# Patient Record
Sex: Male | Born: 1970 | Race: White | Hispanic: No | Smoking: Former smoker
Health system: Southern US, Community
[De-identification: ages and names within clinical notes are randomized; demographics above are authoritative.]

## PROBLEM LIST (undated history)

## (undated) DIAGNOSIS — I2510 Atherosclerotic heart disease of native coronary artery without angina pectoris: Secondary | ICD-10-CM

## (undated) DIAGNOSIS — I429 Cardiomyopathy, unspecified: Secondary | ICD-10-CM

## (undated) DIAGNOSIS — R079 Chest pain, unspecified: Secondary | ICD-10-CM

## (undated) DIAGNOSIS — I480 Paroxysmal atrial fibrillation: Secondary | ICD-10-CM

## (undated) DIAGNOSIS — T4145XA Adverse effect of unspecified anesthetic, initial encounter: Secondary | ICD-10-CM

## (undated) DIAGNOSIS — I10 Essential (primary) hypertension: Secondary | ICD-10-CM

## (undated) DIAGNOSIS — I214 Non-ST elevation (NSTEMI) myocardial infarction: Secondary | ICD-10-CM

## (undated) DIAGNOSIS — E785 Hyperlipidemia, unspecified: Secondary | ICD-10-CM

## (undated) DIAGNOSIS — M1990 Unspecified osteoarthritis, unspecified site: Secondary | ICD-10-CM

## (undated) DIAGNOSIS — I739 Peripheral vascular disease, unspecified: Secondary | ICD-10-CM

## (undated) DIAGNOSIS — E119 Type 2 diabetes mellitus without complications: Secondary | ICD-10-CM

## (undated) DIAGNOSIS — T8859XA Other complications of anesthesia, initial encounter: Secondary | ICD-10-CM

## (undated) DIAGNOSIS — Z720 Tobacco use: Secondary | ICD-10-CM

## (undated) HISTORY — PX: CARDIAC CATHETERIZATION: SHX172

## (undated) HISTORY — PX: CORONARY ANGIOPLASTY: SHX604

## (undated) HISTORY — DX: Cardiomyopathy, unspecified: I42.9

## (undated) HISTORY — DX: Chest pain, unspecified: R07.9

## (undated) HISTORY — DX: Hyperlipidemia, unspecified: E78.5

## (undated) HISTORY — DX: Paroxysmal atrial fibrillation: I48.0

## (undated) HISTORY — DX: Tobacco use: Z72.0

## (undated) HISTORY — DX: Essential (primary) hypertension: I10

## (undated) HISTORY — PX: TYMPANOSTOMY TUBE PLACEMENT: SHX32

---

## 2011-08-13 HISTORY — PX: MULTIPLE TOOTH EXTRACTIONS: SHX2053

## 2015-05-13 DIAGNOSIS — I214 Non-ST elevation (NSTEMI) myocardial infarction: Secondary | ICD-10-CM

## 2015-05-13 HISTORY — DX: Non-ST elevation (NSTEMI) myocardial infarction: I21.4

## 2015-05-21 ENCOUNTER — Encounter (HOSPITAL_COMMUNITY): Payer: Self-pay | Admitting: Emergency Medicine

## 2015-05-21 ENCOUNTER — Inpatient Hospital Stay (HOSPITAL_COMMUNITY)
Admission: EM | Admit: 2015-05-21 | Discharge: 2015-05-23 | DRG: 247 | Disposition: A | Discharge status: Home / Self Care | Payer: No Typology Code available for payment source | Attending: Cardiology | Admitting: Cardiology

## 2015-05-21 ENCOUNTER — Emergency Department (HOSPITAL_COMMUNITY): Payer: No Typology Code available for payment source

## 2015-05-21 DIAGNOSIS — E785 Hyperlipidemia, unspecified: Secondary | ICD-10-CM | POA: Diagnosis not present

## 2015-05-21 DIAGNOSIS — I209 Angina pectoris, unspecified: Secondary | ICD-10-CM

## 2015-05-21 DIAGNOSIS — I2510 Atherosclerotic heart disease of native coronary artery without angina pectoris: Secondary | ICD-10-CM | POA: Diagnosis not present

## 2015-05-21 DIAGNOSIS — Z7984 Long term (current) use of oral hypoglycemic drugs: Secondary | ICD-10-CM | POA: Diagnosis not present

## 2015-05-21 DIAGNOSIS — I214 Non-ST elevation (NSTEMI) myocardial infarction: Secondary | ICD-10-CM | POA: Diagnosis present

## 2015-05-21 DIAGNOSIS — F17210 Nicotine dependence, cigarettes, uncomplicated: Secondary | ICD-10-CM | POA: Diagnosis present

## 2015-05-21 DIAGNOSIS — Z833 Family history of diabetes mellitus: Secondary | ICD-10-CM | POA: Diagnosis not present

## 2015-05-21 DIAGNOSIS — Z8249 Family history of ischemic heart disease and other diseases of the circulatory system: Secondary | ICD-10-CM | POA: Diagnosis not present

## 2015-05-21 DIAGNOSIS — E1165 Type 2 diabetes mellitus with hyperglycemia: Secondary | ICD-10-CM | POA: Diagnosis present

## 2015-05-21 DIAGNOSIS — Z79899 Other long term (current) drug therapy: Secondary | ICD-10-CM

## 2015-05-21 DIAGNOSIS — I255 Ischemic cardiomyopathy: Secondary | ICD-10-CM | POA: Diagnosis present

## 2015-05-21 DIAGNOSIS — R079 Chest pain, unspecified: Secondary | ICD-10-CM

## 2015-05-21 DIAGNOSIS — I4891 Unspecified atrial fibrillation: Secondary | ICD-10-CM | POA: Diagnosis present

## 2015-05-21 DIAGNOSIS — I200 Unstable angina: Secondary | ICD-10-CM | POA: Diagnosis present

## 2015-05-21 DIAGNOSIS — R05 Cough: Secondary | ICD-10-CM | POA: Diagnosis present

## 2015-05-21 DIAGNOSIS — Z720 Tobacco use: Secondary | ICD-10-CM

## 2015-05-21 DIAGNOSIS — E118 Type 2 diabetes mellitus with unspecified complications: Secondary | ICD-10-CM

## 2015-05-21 HISTORY — DX: Adverse effect of unspecified anesthetic, initial encounter: T41.45XA

## 2015-05-21 HISTORY — DX: Hyperlipidemia, unspecified: E78.5

## 2015-05-21 HISTORY — DX: Essential (primary) hypertension: I10

## 2015-05-21 HISTORY — DX: Atherosclerotic heart disease of native coronary artery without angina pectoris: I25.10

## 2015-05-21 HISTORY — DX: Type 2 diabetes mellitus without complications: E11.9

## 2015-05-21 HISTORY — DX: Unspecified osteoarthritis, unspecified site: M19.90

## 2015-05-21 HISTORY — DX: Tobacco use: Z72.0

## 2015-05-21 HISTORY — DX: Other complications of anesthesia, initial encounter: T88.59XA

## 2015-05-21 LAB — TROPONIN I
TROPONIN I: 0.45 ng/mL — AB (ref <0.031)
TROPONIN I: 0.66 ng/mL — AB (ref <0.031)

## 2015-05-21 LAB — CBC
HCT: 51 % (ref 39.0–52.0)
HEMOGLOBIN: 18 g/dL — AB (ref 13.0–17.0)
MCH: 33.6 pg (ref 26.0–34.0)
MCHC: 35.3 g/dL (ref 30.0–36.0)
MCV: 95.1 fL (ref 78.0–100.0)
Platelets: 121 10*3/uL — ABNORMAL LOW (ref 150–400)
RBC: 5.36 MIL/uL (ref 4.22–5.81)
RDW: 12.2 % (ref 11.5–15.5)
WBC: 8.3 10*3/uL (ref 4.0–10.5)

## 2015-05-21 LAB — GLUCOSE, CAPILLARY
Glucose-Capillary: 373 mg/dL — ABNORMAL HIGH (ref 65–99)
Glucose-Capillary: 191 mg/dL — ABNORMAL HIGH (ref 65–99)

## 2015-05-21 LAB — BASIC METABOLIC PANEL
ANION GAP: 10 (ref 5–15)
BUN: 21 mg/dL — ABNORMAL HIGH (ref 6–20)
CHLORIDE: 97 mmol/L — AB (ref 101–111)
CO2: 25 mmol/L (ref 22–32)
Calcium: 9.5 mg/dL (ref 8.9–10.3)
Creatinine, Ser: 1.01 mg/dL (ref 0.61–1.24)
GFR calc non Af Amer: >60 mL/min (ref >60)
Glucose, Bld: 340 mg/dL — ABNORMAL HIGH (ref 65–99)
POTASSIUM: 5.1 mmol/L (ref 3.5–5.1)
Sodium: 132 mmol/L — ABNORMAL LOW (ref 135–145)

## 2015-05-21 LAB — MRSA PCR SCREENING: MRSA by PCR: NEGATIVE

## 2015-05-21 LAB — HEPARIN LEVEL (UNFRACTIONATED)

## 2015-05-21 LAB — I-STAT TROPONIN, ED: Troponin i, poc: 0.04 ng/mL (ref 0.00–0.08)

## 2015-05-21 MED ORDER — SODIUM CHLORIDE 0.9 % IV SOLN
INTRAVENOUS | Status: DC
  Administered 2015-05-22: 1000 mL via INTRAVENOUS

## 2015-05-21 MED ORDER — SODIUM CHLORIDE 0.9 % IJ SOLN: 3.0000 mL | INTRAMUSCULAR | Status: DC | PRN

## 2015-05-21 MED ORDER — ACETAMINOPHEN 325 MG PO TABS
650.0000 mg | ORAL_TABLET | ORAL | Status: DC | PRN
Start: 2015-05-21 — End: 2015-05-23

## 2015-05-21 MED ORDER — METOPROLOL TARTRATE 12.5 MG HALF TABLET
12.5000 mg | ORAL_TABLET | Freq: Two times a day (BID) | ORAL | Status: DC
  Administered 2015-05-21 – 2015-05-23 (×5): 12.5 mg via ORAL
  Filled 2015-05-21 (×5): qty 1

## 2015-05-21 MED ORDER — ONDANSETRON HCL 4 MG/2ML IJ SOLN: 4.0000 mg | Freq: Four times a day (QID) | INTRAMUSCULAR | Status: DC | PRN

## 2015-05-21 MED ORDER — SODIUM CHLORIDE 0.9 % IJ SOLN
3.0000 mL | Freq: Two times a day (BID) | INTRAMUSCULAR | Status: DC
  Administered 2015-05-21: 3 mL via INTRAVENOUS

## 2015-05-21 MED ORDER — HEPARIN BOLUS VIA INFUSION
4000.0000 [IU] | Freq: Once | INTRAVENOUS | Status: AC
  Administered 2015-05-21: 4000 [IU] via INTRAVENOUS
  Filled 2015-05-21: qty 4000

## 2015-05-21 MED ORDER — DILTIAZEM HCL 25 MG/5ML IV SOLN
20.0000 mg | Freq: Once | INTRAVENOUS | Status: AC
  Administered 2015-05-21: 20 mg via INTRAVENOUS
  Filled 2015-05-21: qty 5

## 2015-05-21 MED ORDER — ASPIRIN EC 81 MG PO TBEC
81.0000 mg | DELAYED_RELEASE_TABLET | Freq: Every day | ORAL | Status: DC
  Administered 2015-05-23: 81 mg via ORAL
  Filled 2015-05-21: qty 1

## 2015-05-21 MED ORDER — SODIUM CHLORIDE 0.9 % IV SOLN: 250.0000 mL | INTRAVENOUS | Status: DC | PRN

## 2015-05-21 MED ORDER — DILTIAZEM HCL 100 MG IV SOLR
5.0000 mg/h | INTRAVENOUS | Status: DC
  Administered 2015-05-21 – 2015-05-22 (×2): 5 mg/h via INTRAVENOUS
  Filled 2015-05-21 (×2): qty 100

## 2015-05-21 MED ORDER — ATORVASTATIN CALCIUM 80 MG PO TABS
80.0000 mg | ORAL_TABLET | Freq: Every day | ORAL | Status: DC
  Administered 2015-05-21 – 2015-05-22 (×2): 80 mg via ORAL
  Filled 2015-05-21 (×2): qty 1

## 2015-05-21 MED ORDER — INSULIN ASPART 100 UNIT/ML ~~LOC~~ SOLN
0.0000 [IU] | Freq: Three times a day (TID) | SUBCUTANEOUS | Status: DC
  Administered 2015-05-21: 15 [IU] via SUBCUTANEOUS
  Administered 2015-05-22: 8 [IU] via SUBCUTANEOUS
  Administered 2015-05-22 (×2): 5 [IU] via SUBCUTANEOUS
  Administered 2015-05-23: 8 [IU] via SUBCUTANEOUS

## 2015-05-21 MED ORDER — NITROGLYCERIN 0.4 MG SL SUBL: 0.4000 mg | SUBLINGUAL_TABLET | SUBLINGUAL | Status: DC | PRN

## 2015-05-21 MED ORDER — NITROGLYCERIN IN D5W 200-5 MCG/ML-% IV SOLN
0.0000 ug/min | Freq: Once | INTRAVENOUS | Status: AC
  Administered 2015-05-21: 10 ug/min via INTRAVENOUS
  Filled 2015-05-21: qty 250

## 2015-05-21 MED ORDER — HEPARIN (PORCINE) IN NACL 100-0.45 UNIT/ML-% IJ SOLN
2000.0000 [IU]/h | INTRAMUSCULAR | Status: DC
  Administered 2015-05-21: 1300 [IU]/h via INTRAVENOUS
  Administered 2015-05-21: 1700 [IU]/h via INTRAVENOUS
  Filled 2015-05-21 (×3): qty 250

## 2015-05-21 MED ORDER — HEPARIN BOLUS VIA INFUSION
3000.0000 [IU] | Freq: Once | INTRAVENOUS | Status: AC
  Administered 2015-05-21: 3000 [IU] via INTRAVENOUS
  Filled 2015-05-21: qty 3000

## 2015-05-21 MED ORDER — ASPIRIN 81 MG PO CHEW
324.0000 mg | CHEWABLE_TABLET | Freq: Once | ORAL | Status: AC
  Administered 2015-05-21: 324 mg via ORAL
  Filled 2015-05-21: qty 4

## 2015-05-21 NOTE — Progress Notes (Signed)
ANTICOAGULATION CONSULT NOTE - Initial Consult  Pharmacy Consult for Heparin Indication: ACS / STEMI  No Known Allergies  Patient Measurements: Height:  (180.3 cm) Weight: 260 lb (117.935 kg) IBW/kg (Calculated) : 75.3 Heparin Dosing Weight: 101 kg  Vital Signs: Temp: 97.5 F (36.4 C) (10/09 0901) Temp Source: Oral (10/09 0901) BP: 102/70 mmHg (10/09 1046) Pulse Rate: 88 (10/09 1046)  Labs:  Recent Labs  05/21/15 0920  HGB 18.0*  HCT 51.0  PLT 121*  CREATININE 1.01    Estimated Creatinine Clearance: 119.3 mL/min (by C-G formula based on Cr of 1.01).   Medical History: Past Medical History  Diagnosis Date  . Diabetes mellitus without complication [facility])     Assessment: 44 yo M presents to the ED on 10/9 with chest pain and SOB. Pharmacy consulted to start heparin for ACS. Not on any anticoag PTA. Hgb ok and plts low at 121. SCr 1.01, CrCl > 118ml/min.  Goal of Therapy:  Heparin level 0.3-0.7 units/ml Monitor platelets by anticoagulation protocol: Yes   Plan:  Give 4,000 units heparin BOLUS Start heparin gtt at 1,300 units/hr Check 6 hr HL Monitor daily HL, CBC, s/s of bleed  Kambria Grima J 05/21/2015,11:00 AM

## 2015-05-21 NOTE — Progress Notes (Signed)
ANTICOAGULATION CONSULT NOTE - Follow Up Consult  Pharmacy Consult for heparin Indication: chest pain/ACS  No Known Allergies  Patient Measurements: Height:  (180.3 cm) Weight: 257 lb (116.574 kg) IBW/kg (Calculated) : 75.3 Heparin Dosing Weight: 101 kg  Vital Signs: Temp: 98.1 F (36.7 C) (10/09 1927) Temp Source: Oral (10/09 1927) BP: 117/78 mmHg (10/09 1927) Pulse Rate: 71 (10/09 1927)  Labs:  Recent Labs  05/21/15 0920 05/21/15 1620 05/21/15 1853  HGB 18.0*  --   --   HCT 51.0  --   --   PLT 121*  --   --   HEPARINUNFRC  --   --  <0.10*  CREATININE 1.01  --   --   TROPONINI  --  0.45*  --     Estimated Creatinine Clearance: 118.7 mL/min (by C-G formula based on Cr of 1.01).   Medications:  Infusions:  . [START ON 05/22/2015] sodium chloride    . diltiazem (CARDIZEM) infusion 5 mg/hr (05/21/15 1525)  . heparin 1,300 Units/hr (05/21/15 1209)    Assessment: 44 yo M presents to the ED on 10/9 with chest pain and SOB, also with new onset Afib. He continues on IV heparin. Plan is for cath tomorrow and possible TEE/DCCV after cath.   Heparin level is SUBtherapeutic at <0.1. No bleeding noted.  Goal of Therapy:  Heparin level 0.3-0.7 units/ml Monitor platelets by anticoagulation protocol: Yes   Plan:  - Heparin 3000 units IV bolus then increase rate to 1700 units/hr - 8 hr heparin level - Daily heparin level and CBC - Monitor for s/sx of bleeding  [facility], Witmer.D., BCPS Clinical Pharmacist Pager: [phone] 05/21/2015 8:22 PM

## 2015-05-21 NOTE — ED Notes (Signed)
Pt. Stated, I started having chest pain wih SOB for a week now. I've had a cough but I think its from the SOB. I felt like it was a pulled muscle at 1st but then i got too bad.

## 2015-05-21 NOTE — H&P (Signed)
Patient ID: [name] MRN: [id number], DOB/AGE: 05-02-1971   Admit date: 05/21/2015   Primary Physician: No primary care provider on file. Primary Cardiologist: [name]   [name]:[id number] [name] is a 44 y.o. male with past medical history of Type 2 Diabetes Mellitus, Hyperlipidemia, and tobacco abuse who presents to [name] ED on 05/21/2015 for worsening chest pain over the past 4 days. Also found to be in atrial fibrillation upon arrival with rate in the 130's.  The patient reports he has experienced episodes of chest pain over the past few years, which are worse with physical exertion. Over the past four days, he reports the pain has been more frequent and has intensified in severity. He notes the pain is an intense pressure that he rates as a 10/10 at its worse. He reports the pain intensifies with activity such as walking and is relieved with rest. He has not taken anything for the pain, including OTC medications or SL NTG. He reports having shortness of breath with the pain, along with nausea and diaphoresis. He notes exertional dyspnea over the past few years, but has credited it to his significant tobacco abuse. He also notes having a dry cough which has developed over the past few days. He denies any orthopnea or nocturnal dyspnea. His pain has been relieved while receiving NTG in the ED and now rates his pain as a 3/10.   When asked about palpitations, he reports having those "off and on" for several years. He denies any history of atrial fibrillation or other irregular heart rhythms.  He had cardiac catheterizations in 2000 and 2005 which he reports showed less than 20% blockages. He has never required stenting or balloon angioplasty.  While in the ED, CXR showed no acute disease. Glucose elevated to 340. Initial troponin negative. Initiaql EKG showing atrial fibrillation with RVR, rate in 130's. He was given Cardizem 20mg  and repeat EKG now shows atrial fibrillation with rate in  70's.  He is employed as a [name] and travels across the country with his wife. [name] but were recently [place] the patient's [name] after completing a cruise in [name]. He reports having a PCP Ian Sam, MD) he sees in [name] for his DM and HLD. He currently takes Metformin and Lipitor. He reports having not taken his medications in the past few weeks due to forgetting to take them. Use to take Lisinopril in the past for HTN, but reports hie no longer has high blood pressure.   Problem List Past Medical History  Diagnosis Date  . Diabetes mellitus without complication (HCC)   . HLD (hyperlipidemia)     Past Surgical History  Procedure Laterality Date  . Cardiac catheterization  2000, 2005    "less than 20% blockage"  . Dental surgery  2013      Home Medications Prior to Admission medications   Medication Sig Start Date End Date Taking? Authorizing Provider  lisinopril (PRINIVIL,ZESTRIL) 20 MG tablet Take 20 mg by mouth daily.   Yes Historical Provider, MD  metFORMIN (GLUCOPHAGE) 500 MG tablet Take 500 mg by mouth 2 (two) times daily with a meal.   Yes Historical Provider, MD    Allergies No Known Allergies  Past Medical History: Past Medical History  Diagnosis Date  . Diabetes mellitus without complication (HCC)   . HLD (hyperlipidemia)      Surgical History: Past Surgical History  Procedure Laterality Date  . Cardiac catheterization  2000,  2005    "less than 20% blockage"  . Dental surgery  2013     Family History: Family History  Problem Relation Age of Onset  . Heart attack Father   . Diabetes Sister   . Diabetes Mother   . Diabetes Maternal Grandmother     Social History Social History   Social History  . Marital Status: Married    Spouse Name: N/A  . Number of Children: N/A  . Years of Education: N/A   Occupational History  . Not on file.   Social History Main Topics  . Smoking status:  Current Every Day Smoker -- 2.00 packs/day for 35 years    Types: Cigarettes  . Smokeless tobacco: Not on file  . Alcohol Use: 0.0 oz/week    0 Standard drinks or equivalent per week     Comment: Social Drinker  . Drug Use: No  . Sexual Activity: Not on file   Other Topics Concern  . Not on file   Social History Narrative   Employed as a [name]. [name]     Review of Systems: General:  No chills, fever, night sweats or weight changes.  Cardiovascular:  No edema, orthopnea, palpitations, paroxysmal nocturnal dyspnea. Positive for chest pain and dyspnea on exertion. Dermatological: No rash, lesions/masses Respiratory: Positive for cough and dyspnea Urologic: No hematuria, dysuria Abdominal:   No vomiting, diarrhea, bright red blood per rectum, melena, or hematemesis. Positive for nausea. Neurologic:  No visual changes, wkns, changes in mental status. All other systems reviewed and are otherwise negative except as noted above.   Physical Exam: Blood pressure 110/86, pulse 71, temperature 97.5 F (36.4 C), temperature source Oral, resp. rate 13, height  (1.803 m), weight 260 lb (117.935 kg), SpO2 96 %.  General: Well developed, obese Caucasian ,male in no acute distress. Head: Normocephalic, atraumatic, sclera non-icteric, no xanthomas, nares are without discharge. Dentition:  Neck: No carotid bruits. JVD not elevated.  Lungs: Respirations regular and unlabored, without wheezes or rales.  Heart: Regular rate and rhythm. No S3 or S4.  No murmur, no rubs, or gallops appreciated. Abdomen: Soft, non-tender, non-distended with normoactive bowel sounds. No hepatomegaly. No rebound/guarding. No obvious abdominal masses. Msk:  Strength and tone appear normal for age. No joint deformities or effusions. Extremities: No clubbing or cyanosis. No edema.  Distal pedal pulses are 2+ bilaterally. Neuro: Alert and oriented X 3. Moves all extremities spontaneously.  No focal deficits noted. Psych:  Responds to questions appropriately with a normal affect. Skin: No rashes or lesions noted   Labs: Lab Results  Component Value Date   WBC 8.3 05/21/2015   HGB 18.0* 05/21/2015   HCT 51.0 05/21/2015   MCV 95.1 05/21/2015   PLT 121* 05/21/2015     Recent Labs Lab 05/21/15 0920  NA 132*  K 5.1  CL 97*  CO2 25  BUN 21*  CREATININE 1.01  CALCIUM 9.5  GLUCOSE 340*   Troponin (Point of Care Test)  Recent Labs  05/21/15 0927  TROPIPOC 0.04     ECG: Atrial Fibrillation with RVR. Rate in 130's.  Echo: Pending  Radiology/Studies: Dg Chest Port 1 View: 05/21/2015   CLINICAL DATA:  Chest pain and shortness of breath.  EXAM: PORTABLE CHEST 1 VIEW  COMPARISON:  None.  FINDINGS: The heart size is at the upper limits of normal. There likely is a component of chronic lung disease/ COPD. Mild bibasilar scarring/atelectasis present. There is no evidence  of pulmonary edema, consolidation, pneumothorax, nodule or pleural fluid.  IMPRESSION: No active disease. Probable component of chronic lung disease/ COPD. Top-normal heart size.   Electronically Signed   By: Irish Lack M.D.   On: 05/21/2015 09:41   ASSESSMENT AND PLAN:  1. Chest pain - has been having exertional chest pain over the past few years that recently increased in severity over the past four days. Has several cardiac risk factors including uncontrolled Type 2 DM, HLD, and Tobacco Abuse.  - Last known cath was in 2005 and showed "20% blockages at that time". Has never required PCI. - Admit to step-down unit. - Will plan for LHC in the AM with possible PCI. NPO after midnight. - Obtain cyclic troponin values. Repeat EKG in AM. - Will start low-dose BB, ASA, and statin. Continue Heparin.  2. [name] onset atrial fibrillation (HCC) - no known history of atrial fibrillation or irregular heart rhythms. - initially had rate in the 130's, but is now in 80's. - will continue Cardizem drip - may  require TEE/DCCV following catheterization if not returned to NSR by then. Consider NOAC for long-tern anticoagulation.  3. Hyperlipidemia - obtaining lipid panel. Was on Lipitor at home, but unsure of dosage. - start high-dose statin therapy.  4. UncontrolledType 2 diabetes mellitus (HCC) - obtain A1c - hold metformin while admitted. On SSI.  5. Tobacco abuse - smoked since age 89, with over a 67 pack-year history - smoking cessation encouraged.   Signed, Ellsworth Lennox, [name] 05/21/2015, 12:40 PM Pager: [phone]  The patient was seen, examined and discussed with [name]. Sharol Harness, [name] and I agree with the above.   44 year old male ongoing smoker with h/o uncontrolled diabetes, FH of premature CAD (father) who presented with chest pain that is exertional and relieved at rest. It started a while ago but is progressively worsening in frequency and intensity. About 6 months ago he would feel pain while playing soccer, now with walking 1 flight of stairs. He had a cath about 10 years ago and it showed non-obstructive CAD at that time (he was 44 years old). On arrival to the ER he was found to be in a-fib with RVR. Initial troponin is negative.  ECG shows mild diffuse ST depressions in the settings of a-fib with RVR.  Plan: - start Heparin drip, oral metoprolol for unstable angina - continue cycling troponin and ECGs - schedule for a left cardiac cath in the am - start cardizem drip - he will need oralo anticoagulation at discharge (CHADS-VASc 3). - decission about potential TEE/DCCV after the cath  [name] 05/21/2015

## 2015-05-21 NOTE — ED Provider Notes (Signed)
CSN: [id number]     Arrival date & time 05/21/15  4098 History   First MD Initiated Contact with Patient 05/21/15 [phone]     Chief Complaint  Patient presents with  . Chest Pain  . Shortness of Breath  . Cough     (Consider location/radiation/quality/duration/timing/severity/associated sxs/prior Treatment) HPI Complains of left anterior chest pain rating to left arm with mild cough and shortness of breath onset 4 days ago. Discomfort is worse with exertion improved with rest. Associated symptoms include mild shortness of breath and mild cough. No fever. No treatment prior to coming here. No other associated symptoms pain is described as sharp. Presently pain is minimal. Past Medical History  Diagnosis Date  . Diabetes mellitus without complication (HCC)    hypertension History reviewed. No pertinent past surgical history. No family history on file. Social History  Substance Use Topics  . Smoking status: Current Every Day Smoker  . Smokeless tobacco: None  . Alcohol Use: Yes    Review of Systems  Constitutional: Negative.   HENT: Negative.   Respiratory: Positive for cough and shortness of breath.   Cardiovascular: Positive for chest pain.  Gastrointestinal: Negative.   Musculoskeletal: Negative.   Skin: Negative.   Allergic/Immunologic: Positive for immunocompromised state.       Diabetic  Neurological: Negative.   Psychiatric/Behavioral: Negative.   All other systems reviewed and are negative.     Allergies  Review of patient's allergies indicates not on file.  Home Medications   Prior to Admission medications   Not on File   BP 135/97 mmHg  Pulse 65  Temp(Src) 97.5 F (36.4 C) (Oral)  Resp 17  Ht  (1.803 m)  Wt 260 lb (117.935 kg)  BMI 36.28 kg/m2  SpO2 95% Physical Exam  Constitutional: He appears well-developed and well-nourished. No distress.  HENT:  Head: Normocephalic and atraumatic.  Eyes: Conjunctivae are normal. Pupils are equal, round,  and reactive to light.  Neck: Neck supple. No tracheal deviation present. No thyromegaly present.  Cardiovascular:  No murmur heard. Tachycardic irregularly irregular  Pulmonary/Chest: Effort normal and breath sounds normal.  Obese  Abdominal: Soft. Bowel sounds are normal. He exhibits no distension. There is no tenderness.  Musculoskeletal: Normal range of motion. He exhibits no edema or tenderness.  Neurological: He is alert. Coordination normal.  Skin: Skin is warm and dry. No rash noted.  Psychiatric: He has a normal mood and affect.  Nursing note and vitals reviewed.   ED Course  Procedures (including critical care time) Labs Review Labs Reviewed - No data to display  Imaging Review No results found. I have personally reviewed and evaluated these images and lab results as part of my medical decision-making.   EKG Interpretation   Date/Time:  :15 AM patient asymptomatic, pain-free after treatment with intravenous Cardizem and aspirin. Remains in atrial fibrillation at approximately 90 bpm Chest x-ray viewed by me Results for orders placed or performed during the hospital encounter of 05/21/15  Basic metabolic panel  Result Value Ref Range   Sodium 132 (L) 135 - 145 mmol/L  Potassium 5.1 3.5 - 5.1 mmol/L   Chloride 97 (L) 101 - 111 mmol/L   CO2 25 22 - 32 mmol/L   Glucose, Bld 340 (H) 65 - 99 mg/dL   BUN 21 (H) 6 - 20 mg/dL   Creatinine, Ser 9.14 0.61 - 1.24 mg/dL   Calcium 9.5 8.9 - 78.2 mg/dL   GFR calc non Af Amer >60 >60 mL/min   GFR calc Af Amer >60 >60 mL/min   Anion gap  10 5 - 15  CBC  Result Value Ref Range   WBC 8.3 4.0 - 10.5 K/uL   RBC 5.36 4.22 - 5.81 MIL/uL   Hemoglobin 18.0 (H) 13.0 - 17.0 g/dL   HCT 95.6 21.3 - 08.6 %   MCV 95.1 78.0 - 100.0 fL   MCH 33.6 26.0 - 34.0 pg   MCHC 35.3 30.0 - 36.0 g/dL   RDW 57.8 46.9 - 62.9 %   Platelets 121 (L) 150 - 400 K/uL  I-Stat Troponin, ED (not at [facility])  Result Value Ref Range   Troponin i, poc 0.04 0.00 - 0.08 ng/mL   Comment 3           Dg Chest Port 1 View  05/21/2015   CLINICAL DATA:  Chest pain and shortness of breath.  EXAM: PORTABLE CHEST 1 VIEW  COMPARISON:  None.  FINDINGS: The heart size is at the upper limits of normal. There likely is a component of chronic lung disease/ COPD. Mild bibasilar scarring/atelectasis present. There is no evidence of pulmonary edema, consolidation, pneumothorax, nodule or pleural fluid.  IMPRESSION: No active disease. Probable component of chronic lung disease/ COPD. Top-normal heart size.   Electronically Signed   By: [place] M.D.   On: 05/21/2015 09:41   1025 am pt again c/o chest pain anterior 6/10. Iv nitroglycerin drip and intravenous heparin per pharmacy protocol ordered  1128 am asypmtomatic, pain free after treatement with in ntg drip. Awaiting arrival of cardiology  ED ECG REPORT   Date: 05/21/2015  Rate: 75  Rhythm: atrial fibrillation  QRS Axis: right  Intervals: normal  ST/T Wave abnormalities: nonspecific T wave changes  Conduction Disutrbances:none  Narrative Interpretation:   Old EKG Reviewed: changes noted Rate has slowed from initial tracing today I have personally reviewed the EKG tracing and agree with the computerized printout as noted. MDM  Cardiac risk factors smoker, family history, obesity, diabetes, hypertension. Heart score equals 6. Patient not candidate for cardioversion and discharge. Plan rate control in the ED, aspirin therapy. Symptoms onset greater than 48 hours ago Final diagnoses:  None   spoke with Dr. [name] from  cardiology service will evaluate patient for admission I councilled pt for 5 minutes on smoking cessation Dx #1 atrial fibrillation with rapid ventriculare response #2 hyperglycemia #3unstable angina #4tobacco abuse   CRITICAL CARE Performed by: Doug Sou Total critical care time: 40 minutes Critical care time was exclusive of separately billable procedures and treating other patients. Critical care was necessary to treat or prevent imminent or life-threatening deterioration. Critical care was time spent personally by me on the following activities: development of treatment plan with patient and/or surrogate as well as nursing, discussions with consultants, evaluation of patient's response to treatment, examination of patient, obtaining history from patient or surrogate, ordering and performing treatments and interventions, ordering and review of laboratory studies, ordering and review of radiographic studies, pulse oximetry and re-evaluation of patient's condition.  Doug Sou, MD 05/21/15 1131

## 2015-05-21 NOTE — ED Notes (Signed)
Cardiology at bedside.

## 2015-05-22 ENCOUNTER — Encounter (HOSPITAL_COMMUNITY)
Admission: EM | Disposition: A | Discharge status: Home / Self Care | Payer: Self-pay | Source: Home / Self Care | Attending: Cardiology

## 2015-05-22 ENCOUNTER — Inpatient Hospital Stay (HOSPITAL_COMMUNITY): Payer: No Typology Code available for payment source

## 2015-05-22 ENCOUNTER — Encounter (HOSPITAL_COMMUNITY): Payer: Self-pay | Admitting: General Practice

## 2015-05-22 DIAGNOSIS — I214 Non-ST elevation (NSTEMI) myocardial infarction: Secondary | ICD-10-CM | POA: Insufficient documentation

## 2015-05-22 DIAGNOSIS — I2510 Atherosclerotic heart disease of native coronary artery without angina pectoris: Secondary | ICD-10-CM

## 2015-05-22 DIAGNOSIS — I4891 Unspecified atrial fibrillation: Secondary | ICD-10-CM

## 2015-05-22 HISTORY — PX: CARDIAC CATHETERIZATION: SHX172

## 2015-05-22 LAB — CBC
HEMATOCRIT: 46.5 % (ref 39.0–52.0)
HEMOGLOBIN: 16.5 g/dL (ref 13.0–17.0)
MCH: 33.5 pg (ref 26.0–34.0)
MCHC: 35.5 g/dL (ref 30.0–36.0)
MCV: 94.3 fL (ref 78.0–100.0)
Platelets: 114 10*3/uL — ABNORMAL LOW (ref 150–400)
RBC: 4.93 MIL/uL (ref 4.22–5.81)
RDW: 12.1 % (ref 11.5–15.5)
WBC: 7.5 10*3/uL (ref 4.0–10.5)

## 2015-05-22 LAB — PROTIME-INR
INR: 1.04 (ref 0.00–1.49)
Prothrombin Time: 13.8 seconds (ref 11.6–15.2)

## 2015-05-22 LAB — TROPONIN I: Troponin I: 0.6 ng/mL (ref <0.031)

## 2015-05-22 LAB — GLUCOSE, CAPILLARY
Glucose-Capillary: 241 mg/dL — ABNORMAL HIGH (ref 65–99)
Glucose-Capillary: 262 mg/dL — ABNORMAL HIGH (ref 65–99)
Glucose-Capillary: 221 mg/dL — ABNORMAL HIGH (ref 65–99)
Glucose-Capillary: 268 mg/dL — ABNORMAL HIGH (ref 65–99)

## 2015-05-22 LAB — BASIC METABOLIC PANEL
ANION GAP: 7 (ref 5–15)
BUN: 15 mg/dL (ref 6–20)
CO2: 26 mmol/L (ref 22–32)
Calcium: 9 mg/dL (ref 8.9–10.3)
Chloride: 102 mmol/L (ref 101–111)
Creatinine, Ser: 0.91 mg/dL (ref 0.61–1.24)
GFR calc Af Amer: >60 mL/min (ref >60)
GLUCOSE: 210 mg/dL — AB (ref 65–99)
POTASSIUM: 4.1 mmol/L (ref 3.5–5.1)
Sodium: 135 mmol/L (ref 135–145)

## 2015-05-22 LAB — LIPID PANEL
Cholesterol: 214 mg/dL — ABNORMAL HIGH (ref 0–200)
HDL: 27 mg/dL — ABNORMAL LOW (ref >40)
LDL CALC: UNDETERMINED mg/dL (ref 0–99)
Total CHOL/HDL Ratio: 7.9 RATIO
Triglycerides: 404 mg/dL — ABNORMAL HIGH (ref <150)
VLDL: UNDETERMINED mg/dL (ref 0–40)

## 2015-05-22 LAB — PLATELET COUNT: Platelets: 134 10*3/uL — ABNORMAL LOW (ref 150–400)

## 2015-05-22 LAB — POCT ACTIVATED CLOTTING TIME
Activated Clotting Time: 429 seconds
Activated Clotting Time: 300 seconds

## 2015-05-22 LAB — HEPARIN LEVEL (UNFRACTIONATED): Heparin Unfractionated: 0.17 IU/mL — ABNORMAL LOW (ref 0.30–0.70)

## 2015-05-22 SURGERY — LEFT HEART CATH AND CORONARY ANGIOGRAPHY
Anesthesia: LOCAL

## 2015-05-22 MED ORDER — SODIUM CHLORIDE 0.9 % WEIGHT BASED INFUSION
1.0000 mL/kg/h | INTRAVENOUS | Status: DC
  Administered 2015-05-22: 1 mL/kg/h via INTRAVENOUS

## 2015-05-22 MED ORDER — LIDOCAINE HCL (PF) 1 % IJ SOLN
INTRAMUSCULAR | Status: DC | PRN
  Administered 2015-05-22: 09:00:00

## 2015-05-22 MED ORDER — HEPARIN SODIUM (PORCINE) 1000 UNIT/ML IJ SOLN
INTRAMUSCULAR | Status: AC
  Filled 2015-05-22: qty 1

## 2015-05-22 MED ORDER — TICAGRELOR 90 MG PO TABS
ORAL_TABLET | ORAL | Status: AC
  Filled 2015-05-22: qty 2

## 2015-05-22 MED ORDER — TICAGRELOR 90 MG PO TABS: 90.0000 mg | ORAL_TABLET | Freq: Two times a day (BID) | ORAL | Status: DC

## 2015-05-22 MED ORDER — MIDAZOLAM HCL 2 MG/2ML IJ SOLN
INTRAMUSCULAR | Status: AC
  Filled 2015-05-22: qty 4

## 2015-05-22 MED ORDER — FENTANYL CITRATE (PF) 100 MCG/2ML IJ SOLN
INTRAMUSCULAR | Status: DC | PRN
  Administered 2015-05-22 (×3): 25 ug via INTRAVENOUS

## 2015-05-22 MED ORDER — FENTANYL CITRATE (PF) 100 MCG/2ML IJ SOLN
INTRAMUSCULAR | Status: AC
  Filled 2015-05-22: qty 4

## 2015-05-22 MED ORDER — APIXABAN 5 MG PO TABS
5.0000 mg | ORAL_TABLET | Freq: Two times a day (BID) | ORAL | Status: DC
  Administered 2015-05-23: 10:00:00 5 mg via ORAL
  Filled 2015-05-22: qty 1

## 2015-05-22 MED ORDER — HEPARIN (PORCINE) IN NACL 2-0.9 UNIT/ML-% IJ SOLN
INTRAMUSCULAR | Status: AC
  Filled 2015-05-22: qty 1000

## 2015-05-22 MED ORDER — CLOPIDOGREL BISULFATE 75 MG PO TABS
300.0000 mg | ORAL_TABLET | Freq: Every day | ORAL | Status: DC
  Administered 2015-05-23: 300 mg via ORAL
  Filled 2015-05-22: qty 4

## 2015-05-22 MED ORDER — ONDANSETRON HCL 4 MG/2ML IJ SOLN: 4.0000 mg | Freq: Four times a day (QID) | INTRAMUSCULAR | Status: DC | PRN

## 2015-05-22 MED ORDER — HEPARIN (PORCINE) IN NACL 2-0.9 UNIT/ML-% IJ SOLN
INTRAMUSCULAR | Status: DC | PRN
Start: 2015-05-22 — End: 2015-05-22
  Administered 2015-05-22: 08:00:00 via INTRA_ARTERIAL

## 2015-05-22 MED ORDER — TIROFIBAN (AGGRASTAT) BOLUS VIA INFUSION
INTRAVENOUS | Status: DC | PRN
  Administered 2015-05-22: 2865 ug via INTRAVENOUS

## 2015-05-22 MED ORDER — TIROFIBAN HCL IV 5 MG/100ML
0.1500 ug/kg/min | INTRAVENOUS | Status: DC
  Administered 2015-05-22: 0.15 ug/kg/min via INTRAVENOUS
  Filled 2015-05-22: qty 100

## 2015-05-22 MED ORDER — TICAGRELOR 90 MG PO TABS
90.0000 mg | ORAL_TABLET | Freq: Two times a day (BID) | ORAL | Status: AC
  Administered 2015-05-22: 90 mg via ORAL
  Filled 2015-05-22: qty 1

## 2015-05-22 MED ORDER — TICAGRELOR 90 MG PO TABS
ORAL_TABLET | ORAL | Status: DC | PRN
  Administered 2015-05-22: 180 mg via ORAL

## 2015-05-22 MED ORDER — MIDAZOLAM HCL 2 MG/2ML IJ SOLN
INTRAMUSCULAR | Status: DC | PRN
  Administered 2015-05-22: 1 mg via INTRAVENOUS
  Administered 2015-05-22: 2 mg via INTRAVENOUS
  Administered 2015-05-22: 1 mg via INTRAVENOUS

## 2015-05-22 MED ORDER — HEPARIN BOLUS VIA INFUSION
3000.0000 [IU] | Freq: Once | INTRAVENOUS | Status: AC
  Administered 2015-05-22: 3000 [IU] via INTRAVENOUS
  Filled 2015-05-22: qty 3000

## 2015-05-22 MED ORDER — HEPARIN (PORCINE) IN NACL 100-0.45 UNIT/ML-% IJ SOLN: 2000.0000 [IU]/h | INTRAMUSCULAR | Status: DC

## 2015-05-22 MED ORDER — ASPIRIN 81 MG PO CHEW
CHEWABLE_TABLET | ORAL | Status: AC
  Filled 2015-05-22: qty 1

## 2015-05-22 MED ORDER — ALPRAZOLAM 0.25 MG PO TABS
0.2500 mg | ORAL_TABLET | Freq: Two times a day (BID) | ORAL | Status: DC | PRN
  Administered 2015-05-22: 0.25 mg via ORAL
  Filled 2015-05-22: qty 1

## 2015-05-22 MED ORDER — LIVING WELL WITH DIABETES BOOK
Freq: Once | Status: AC
  Administered 2015-05-22: 18:00:00
  Filled 2015-05-22: qty 1

## 2015-05-22 MED ORDER — LIDOCAINE HCL (PF) 1 % IJ SOLN
INTRAMUSCULAR | Status: AC
  Filled 2015-05-22: qty 30

## 2015-05-22 MED ORDER — TIROFIBAN HCL IV 12.5 MG/250 ML
INTRAVENOUS | Status: DC | PRN
  Administered 2015-05-22: .15 ug/kg/min via INTRAVENOUS

## 2015-05-22 MED ORDER — ANGIOPLASTY BOOK
Freq: Once | Status: AC
  Administered 2015-05-22: 18:00:00
  Filled 2015-05-22: qty 1

## 2015-05-22 MED ORDER — VERAPAMIL HCL 2.5 MG/ML IV SOLN
INTRAVENOUS | Status: AC
  Filled 2015-05-22: qty 2

## 2015-05-22 MED ORDER — SODIUM CHLORIDE 0.9 % IV SOLN: 250.0000 mL | INTRAVENOUS | Status: DC | PRN

## 2015-05-22 MED ORDER — CLOPIDOGREL BISULFATE 75 MG PO TABS: 75.0000 mg | ORAL_TABLET | Freq: Every day | ORAL | Status: DC

## 2015-05-22 MED ORDER — SODIUM CHLORIDE 0.9 % IJ SOLN: 3.0000 mL | INTRAMUSCULAR | Status: DC | PRN

## 2015-05-22 MED ORDER — NICOTINE 21 MG/24HR TD PT24
21.0000 mg | MEDICATED_PATCH | Freq: Every day | TRANSDERMAL | Status: DC
  Administered 2015-05-22 – 2015-05-23 (×2): 21 mg via TRANSDERMAL
  Filled 2015-05-22 (×2): qty 1

## 2015-05-22 MED ORDER — HEPARIN SODIUM (PORCINE) 1000 UNIT/ML IJ SOLN
INTRAMUSCULAR | Status: DC | PRN
  Administered 2015-05-22: 5000 [IU] via INTRAVENOUS
  Administered 2015-05-22: 6000 [IU] via INTRAVENOUS

## 2015-05-22 MED ORDER — TIROFIBAN HCL IV 12.5 MG/250 ML
INTRAVENOUS | Status: AC
  Filled 2015-05-22: qty 250

## 2015-05-22 MED ORDER — SODIUM CHLORIDE 0.9 % IJ SOLN
3.0000 mL | Freq: Two times a day (BID) | INTRAMUSCULAR | Status: DC
  Administered 2015-05-22: 3 mL via INTRAVENOUS

## 2015-05-22 MED ORDER — OFF THE BEAT BOOK
Freq: Once | Status: AC
  Administered 2015-05-22: 15:00:00
  Filled 2015-05-22: qty 1

## 2015-05-22 MED ORDER — LIDOCAINE HCL (PF) 1 % IJ SOLN
INTRAMUSCULAR | Status: DC | PRN
  Administered 2015-05-22: 2 mL

## 2015-05-22 MED ORDER — IOHEXOL 350 MG/ML SOLN
INTRAVENOUS | Status: DC | PRN
  Administered 2015-05-22: 100 mL via INTRAVENOUS
  Administered 2015-05-22: 100 mL

## 2015-05-22 MED ORDER — ACETAMINOPHEN 325 MG PO TABS: 650.0000 mg | ORAL_TABLET | ORAL | Status: DC | PRN

## 2015-05-22 MED ORDER — DILTIAZEM HCL ER COATED BEADS 120 MG PO CP24
120.0000 mg | ORAL_CAPSULE | Freq: Every day | ORAL | Status: DC
  Administered 2015-05-22 – 2015-05-23 (×2): 120 mg via ORAL
  Filled 2015-05-22 (×3): qty 1

## 2015-05-22 MED ORDER — HEART ATTACK BOUNCING BOOK
Freq: Once | Status: AC
  Administered 2015-05-22: 18:00:00
  Filled 2015-05-22: qty 1

## 2015-05-22 MED ORDER — ASPIRIN 81 MG PO CHEW
81.0000 mg | CHEWABLE_TABLET | Freq: Every day | ORAL | Status: DC
  Administered 2015-05-22: 81 mg via ORAL

## 2015-05-22 SURGICAL SUPPLY — 25 items
BALLN ANGIOSCULPT RX 2.5X10 (BALLOONS) ×2
BALLN EMERGE MR 3.0X15 (BALLOONS) ×2
BALLN EUPHORA RX 2.5X12 (BALLOONS) ×2
BALLN ~~LOC~~ EUPHORA RX 4.0X8 (BALLOONS) ×2
BALLOON ANGIOSCULPT RX 2.5X10 (BALLOONS) ×1 IMPLANT
BALLOON EMERGE MR 3.0X15 (BALLOONS) ×1 IMPLANT
BALLOON EUPHORA RX 2.5X12 (BALLOONS) ×1 IMPLANT
BALLOON ~~LOC~~ EUPHORA RX 4.0X8 (BALLOONS) ×1 IMPLANT
CATH INFINITI 5 FR JL3.5 (CATHETERS) ×2 IMPLANT
CATH INFINITI 5FR ANG PIGTAIL (CATHETERS) ×2 IMPLANT
CATH INFINITI JR4 5F (CATHETERS) ×2 IMPLANT
DEVICE RAD COMP TR BAND LRG (VASCULAR PRODUCTS) ×2 IMPLANT
GLIDESHEATH SLEND SS 6F .021 (SHEATH) ×2 IMPLANT
GUIDE CATH RUNWAY 6FR FR4 (CATHETERS) ×2 IMPLANT
KIT ENCORE 26 ADVANTAGE (KITS) ×2 IMPLANT
KIT HEART LEFT (KITS) ×2 IMPLANT
PACK CARDIAC CATHETERIZATION (CUSTOM PROCEDURE TRAY) ×2 IMPLANT
STENT SYNERGY DES 3.5X16 (Permanent Stent) ×2 IMPLANT
SYR MEDRAD MARK V 150ML (SYRINGE) ×2 IMPLANT
TRANSDUCER W/STOPCOCK (MISCELLANEOUS) ×2 IMPLANT
TUBING CIL FLEX 10 FLL-RA (TUBING) ×2 IMPLANT
VALVE GUARDIAN II ~~LOC~~ HEMO (MISCELLANEOUS) ×2 IMPLANT
WIRE ASAHI PROWATER 180CM (WIRE) ×2 IMPLANT
WIRE HI TORQ BMW 190CM (WIRE) ×2 IMPLANT
WIRE SAFE-T 1.5MM-J .035X260CM (WIRE) ×2 IMPLANT

## 2015-05-22 NOTE — Progress Notes (Signed)
Patient is NSR he converted earlier today about 1420. I will continue to monitor.

## 2015-05-22 NOTE — Progress Notes (Signed)
ANTICOAGULATION CONSULT NOTE - Follow Up Consult  Pharmacy Consult for heparin Indication: chest pain/ACS and atrial fibrillation  Labs:  Recent Labs  05/21/15 0920 05/21/15 1620 05/21/15 1853 05/21/15 2020 05/22/15 0233 05/22/15 0354  HGB 18.0*  --   --   --  16.5  --   HCT 51.0  --   --   --  46.5  --   PLT 121*  --   --   --  114*  --   LABPROT  --   --   --   --   --  13.8  INR  --   --   --   --   --  1.04  HEPARINUNFRC  --   --  <0.10*  --   --  0.17*  CREATININE 1.01  --   --   --  0.91  --   TROPONINI  --  0.45*  --  0.66* 0.60*  --     Assessment: 44yo male remains subtherapeutic on heparin after rate change; plan for cath today but not yet on schedule.  Goal of Therapy:  Heparin level 0.3-0.7 units/ml   Plan:  Will rebolus with heparin 3000 units and increase gtt by 3 units/kg/hr to 2000 units/hr and check level in 6hr.  Vernard Gambles, PharmD, BCPS  05/22/2015,4:25 AM

## 2015-05-22 NOTE — Progress Notes (Signed)
  Echocardiogram 2D Echocardiogram has been performed.  Arvil Chaco 05/22/2015, 2:23 PM

## 2015-05-22 NOTE — Progress Notes (Signed)
Subjective: No CP or SOB  Objective: Vital signs in last 24 hours: Temp:  [97.5 F (36.4 C)-98.5 F (36.9 C)] 97.7 F (36.5 C) (10/10 0420) Pulse Rate:  [35-105] 71 (10/10 0420) Resp:  [12-21] 16 (10/10 0420) BP: (93-135)/(66-97) 98/72 mmHg (10/10 0420) SpO2:  [91 %-97 %] 94 % (10/10 0420) Weight:  [252 lb 10.4 oz (114.6 kg)-260 lb (117.935 kg)] 252 lb 10.4 oz (114.6 kg) (10/10 0420) Last BM Date: 05/21/15  Intake/Output from previous day: 10/09 0701 - 10/10 0700 In: 499.5 [P.O.:150; I.V.:349.5] Out: -  Intake/Output this shift: Total I/O In: 481.6 [P.O.:150; I.V.:331.6] Out: -   Medications Scheduled Meds: . aspirin EC  81 mg Oral Daily  . atorvastatin  80 mg Oral q1800  . insulin aspart  0-15 Units Subcutaneous TID WC  . metoprolol tartrate  12.5 mg Oral BID  . sodium chloride  3 mL Intravenous Q12H   Continuous Infusions: . sodium chloride 1,000 mL (05/22/15 0619)  . diltiazem (CARDIZEM) infusion 5 mg/hr (05/22/15 0620)  . heparin 2,000 Units/hr (05/22/15 0600)   PRN Meds:.sodium chloride, acetaminophen, nitroGLYCERIN, ondansetron (ZOFRAN) IV, sodium chloride  PE: General appearance: alert, cooperative and no distress Lungs: clear to auscultation bilaterally Heart: irregularly irregular rhythm and No MM Extremities: No LEE Pulses: 2+ and symmetric Skin: Warm and dry Neurologic: Grossly normal  Lab Results:   Recent Labs  05/21/15 0920 05/22/15 0233  WBC 8.3 7.5  HGB 18.0* 16.5  HCT 51.0 46.5  PLT 121* 114*   BMET  Recent Labs  05/21/15 0920 05/22/15 0233  NA 132* 135  K 5.1 4.1  CL 97* 102  CO2 25 26  GLUCOSE 340* 210*  BUN 21* 15  CREATININE 1.01 0.91  CALCIUM 9.5 9.0   PT/INR  Recent Labs  05/22/15 0354  LABPROT 13.8  INR 1.04   Cholesterol  Recent Labs  05/22/15 0233  CHOL 214*   Lipid Panel     Component Value Date/Time   CHOL 214* 05/22/2015 0233   TRIG 404* 05/22/2015 0233   HDL 27* 05/22/2015 0233   CHOLHDL 7.9 05/22/2015 0233   VLDL UNABLE TO CALCULATE IF TRIGLYCERIDE OVER 400 mg/dL 16/05/9603 5409   LDLCALC UNABLE TO CALCULATE IF TRIGLYCERIDE OVER 400 mg/dL 81/19/1478 2956   Cardiac Panel (last 3 results)  Recent Labs  05/21/15 1620 05/21/15 2020 05/22/15 0233  TROPONINI 0.45* 0.66* 0.60*      Assessment/Plan 44 y.o. male with past medical history of Type 2 Diabetes Mellitus, Hyperlipidemia, and tobacco abuse who presents to [facility] ED on 05/21/2015 for worsening exertional chest pain over the past 4 days. Also found to be in atrial fibrillation upon arrival with rate in the 130's.  Cardiac catheterizations in 2000 and 2005 which he reports showed less than 20% blockages.  Lives in [place] and [place]  Active Problems:   NSTEMI Trop 0.66.  On IV heparin, metoprolol, cardizem.  LHC this morning.  Echo pending.    New onset atrial fibrillation (HCC) CHADS-VASc 3.  Will need anticoag at DC.   He briefly converted to sinus brady at 0240hrs then went back into afib.     Hyperlipidemia  TG 404.  On lipitor 80 now.  He was not on anything prior.   Type 2 diabetes mellitus (HCC) A1C pending.  I suspect it's >10.  Only on metformin prior to admission.  SS insulin now.  Will need follo wup with PCP at home to start insulin  Tobacco abuse   Unstable angina (HCC)   Obesity:  Sleep apnea?    LOS: 1 day    HAGER, BRYAN [name] 05/22/2015 6:53 AM   Agree with note written by Jones Skene PAC  S/P RCA PCI/DES. Rx with Brilenta and aggrastat. On IV dilt. Will transition to PO. Agree with need for Oral A/C. Pt can then be on Plavix and Eliquis. Will D/C Brilenta and start Plavix in AM with 300 mg load then 75 mg daily. 2D pending.   Nanetta Batty 05/22/2015 10:43 AM

## 2015-05-22 NOTE — Progress Notes (Signed)
TR BAND REMOVAL  LOCATION:    right radial  DEFLATED PER PROTOCOL:    Yes.    TIME BAND OFF / DRESSING APPLIED:    1400   SITE UPON ARRIVAL:    Level 0  SITE AFTER BAND REMOVAL:    Level 0  CIRCULATION SENSATION AND MOVEMENT:    Within Normal Limits   Yes.    COMMENTS:   Rechecked site at 1430 with no change in assessment, CSMs wnls, pulses present and remain +2, dressing dry and intact

## 2015-05-22 NOTE — Discharge Instructions (Addendum)
Atrial Fibrillation °Atrial fibrillation is a type of irregular or rapid heartbeat (arrhythmia). In atrial fibrillation, the heart quivers continuously in a chaotic pattern. This occurs when parts of the heart receive disorganized signals that make the heart unable to pump blood normally. This can increase the risk for stroke, heart failure, and other heart-related conditions. There are different types of atrial fibrillation, including: °· Paroxysmal atrial fibrillation. This type starts suddenly, and it usually stops on its own shortly after it starts. °· Persistent atrial fibrillation. This type often lasts longer than a week. It may stop on its own or with treatment. °· Long-lasting persistent atrial fibrillation. This type lasts longer than 12 months. °· Permanent atrial fibrillation. This type does not go away. °Talk with your health care provider to learn about the type of atrial fibrillation that you have. °CAUSES °This condition is caused by some heart-related conditions or procedures, including: °· A heart attack. °· Coronary artery disease. °· Heart failure. °· Heart valve conditions. °· High blood pressure. °· Inflammation of the sac that surrounds the heart (pericarditis). °· Heart surgery. °· Certain heart rhythm disorders, such as Wolf-Parkinson-White syndrome. °Other causes include: °· Pneumonia. °· Obstructive sleep apnea. °· Blockage of an artery in the lungs (pulmonary embolism, or PE). °· Lung cancer. °· Chronic lung disease. °· Thyroid problems, especially if the thyroid is overactive (hyperthyroidism). °· Caffeine. °· Excessive alcohol use or illegal drug use. °· Use of some medicines, including certain decongestants and diet pills. °Sometimes, the cause cannot be found. °RISK FACTORS °This condition is more likely to develop in: °· People who are older in age. °· People who smoke. °· People who have diabetes mellitus. °· People who are overweight (obese). °· Athletes who exercise  vigorously. °SYMPTOMS °Symptoms of this condition include: °· A feeling that your heart is beating rapidly or irregularly. °· A feeling of discomfort or pain in your chest. °· Shortness of breath. °· Sudden light-headedness or weakness. °· Getting tired easily during exercise. °In some cases, there are no symptoms. °DIAGNOSIS °Your health care provider may be able to detect atrial fibrillation when taking your pulse. If detected, this condition may be diagnosed with: °· An electrocardiogram (ECG). °· A Holter monitor test that records your heartbeat patterns over a 24-hour period. °· Transthoracic echocardiogram (TTE) to evaluate how blood flows through your heart. °· Transesophageal echocardiogram (TEE) to view more detailed images of your heart. °· A stress test. °· Imaging tests, such as a CT scan or chest X-ray. °· Blood tests. °TREATMENT °The main goals of treatment are to prevent blood clots from forming and to keep your heart beating at a normal rate and rhythm. The type of treatment that you receive depends on many factors, such as your underlying medical conditions and how you feel when you are experiencing atrial fibrillation. °This condition may be treated with: °· Medicine to slow down the heart rate, bring the heart's rhythm back to normal, or prevent clots from forming. °· Electrical cardioversion. This is a procedure that resets your heart's rhythm by delivering a controlled, low-energy shock to the heart through your skin. °· Different types of ablation, such as catheter ablation, catheter ablation with pacemaker, or surgical ablation. These procedures destroy the heart tissues that send abnormal signals. When the pacemaker is used, it is placed under your skin to help your heart beat in a regular rhythm. °HOME CARE INSTRUCTIONS °· Take over-the counter and prescription medicines only as told by your health care provider. °·   If your health care provider prescribed a blood-thinning medicine  (anticoagulant), take it exactly as told. Taking too much blood-thinning medicine can cause bleeding. If you do not take enough blood-thinning medicine, you will not have the protection that you need against stroke and other problems. °· Do not use tobacco products, including cigarettes, chewing tobacco, and e-cigarettes. If you need help quitting, ask your health care provider. °· If you have obstructive sleep apnea, manage your condition as told by your health care provider. °· Do not drink alcohol. °· Do not drink beverages that contain caffeine, such as coffee, soda, and tea. °· Maintain a healthy weight. Do not use diet pills unless your health care provider approves. Diet pills may make heart problems worse. °· Follow diet instructions as told by your health care provider. °· Exercise regularly as told by your health care provider. °· Keep all follow-up visits as told by your health care provider. This is important. °PREVENTION °· Avoid drinking beverages that contain caffeine or alcohol. °· Avoid certain medicines, especially medicines that are used for breathing problems. °· Avoid certain herbs and herbal medicines, such as those that contain ephedra or ginseng. °· Do not use illegal drugs, such as cocaine and amphetamines. °· Do not smoke. °· Manage your high blood pressure. °SEEK MEDICAL CARE IF: °· You notice a change in the rate, rhythm, or strength of your heartbeat. °· You are taking an anticoagulant and you notice increased bruising. °· You tire more easily when you exercise or exert yourself. °SEEK IMMEDIATE MEDICAL CARE IF: °· You have chest pain, abdominal pain, sweating, or weakness. °· You feel nauseous. °· You notice blood in your vomit, bowel movement, or urine. °· You have shortness of breath. °· You suddenly have swollen feet and ankles. °· You feel dizzy. °· You have sudden weakness or numbness of the face, arm, or leg, especially on one side of the body. °· You have trouble speaking,  trouble understanding, or both (aphasia). °· Your face or your eyelid droops on one side. °These symptoms may represent a serious problem that is an emergency. Do not wait to see if the symptoms will go away. Get medical help right away. Call your local emergency services (911 in the [place] Do not drive yourself to the hospital. °  °This information is not intended to replace advice given to you by your health care provider. Make sure you discuss any questions you have with your health care provider. °  °Document Released: 07/29/2005 Document Revised: 04/19/2015 Document Reviewed: 11/23/2014 °Elsevier Interactive Patient Education ©2016 [employer] ° °-------------------------------------------- °Information on my medicine - ELIQUIS® (apixaban) ° °This medication education was reviewed with me or my healthcare representative as part of my discharge preparation.  The pharmacist that spoke with me during my hospital stay was:  Meyer, Andrew David, RPH ° °Why was Eliquis® prescribed for you? °Eliquis® was prescribed for you to reduce the risk of a blood clot forming that can cause a stroke if you have a medical condition called atrial fibrillation (a type of irregular heartbeat). ° °What do You need to know about Eliquis® ? °Take your Eliquis® TWICE DAILY - one tablet in the morning and one tablet in the evening with or without food. If you have difficulty swallowing the tablet whole please discuss with your pharmacist how to take the medication safely. ° °Take Eliquis® exactly as prescribed by your doctor and DO NOT stop taking Eliquis® without talking to the doctor who prescribed the medication.    Stopping may increase your risk of developing a stroke.  Refill your prescription before you run out. ° °After discharge, you should have regular check-up appointments with your healthcare provider that is prescribing your Eliquis®.  In the future your dose may need to be changed if your kidney function or weight changes  by a significant amount or as you get older. ° °What do you do if you miss a dose? °If you miss a dose, take it as soon as you remember on the same day and resume taking twice daily.  Do not take more than one dose of ELIQUIS at the same time to make up a missed dose. ° °Important Safety Information °A possible side effect of Eliquis® is bleeding. You should call your healthcare provider right away if you experience any of the following: °? Bleeding from an injury or your nose that does not stop. °? Unusual colored urine (red or dark brown) or unusual colored stools (red or black). °? Unusual bruising for unknown reasons. °? A serious fall or if you hit your head (even if there is no bleeding). ° °Some medicines may interact with Eliquis® and might increase your risk of bleeding or clotting while on Eliquis®. To help avoid this, consult your healthcare provider or pharmacist prior to using any new prescription or non-prescription medications, including herbals, vitamins, non-steroidal anti-inflammatory drugs (NSAIDs) and supplements. ° °This website has more information on Eliquis® (apixaban): http://www.eliquis.com/eliquis/home °

## 2015-05-22 NOTE — Progress Notes (Addendum)
ANTICOAGULATION CONSULT NOTE - Follow Up Consult  Pharmacy Consult for apixiban Indication: chest pain/ACS  No Known Allergies  Patient Measurements: Height:  (180.3 cm) Weight: 252 lb 10.4 oz (114.6 kg) IBW/kg (Calculated) : 75.3   Vital Signs: Temp: 98 F (36.7 C) (10/10 0700) Temp Source: Oral (10/10 0700) BP: 143/91 mmHg (10/10 1130) Pulse Rate: 87 (10/10 1130)  Labs:  Recent Labs  05/21/15 0920 05/21/15 1620 05/21/15 1853 05/21/15 2020 05/22/15 0233 05/22/15 0354  HGB 18.0*  --   --   --  16.5  --   HCT 51.0  --   --   --  46.5  --   PLT 121*  --   --   --  114*  --   LABPROT  --   --   --   --   --  13.8  INR  --   --   --   --   --  1.04  HEPARINUNFRC  --   --  <0.10*  --   --  0.17*  CREATININE 1.01  --   --   --  0.91  --   TROPONINI  --  0.45*  --  0.66* 0.60*  --     Estimated Creatinine Clearance: 130.6 mL/min (by C-G formula based on Cr of 0.91).   Assessment: 44 yo M presents to the ED on 10/9 with chest pain and SOB, also with new onset Afib. He is now s/p cath with stent to RCA and pharmacy consulted to dose apixiban beginning 05/23/15. Plans noted to change from brilinta to plavix in am.  Goal of Therapy:  Monitor platelets by anticoagulation protocol: Yes   Plan:  -Apixiban  po bid beginning 06/23/15 -Consider aspirin for 30 days post PCI? -Will provide patient education  Harland German, Pharm D 05/22/2015 1:48 PM

## 2015-05-22 NOTE — Progress Notes (Signed)
R radial and R&L groins shaved in preparation for cardiac cath. Pt has been NPO since midnight.Heparin and Cardizem infusing as ordered. VS stable. Monitor shows Afib rate controlled.

## 2015-05-22 NOTE — Interval H&P Note (Signed)
Cath Lab Visit (complete for each Cath Lab visit)  Clinical Evaluation Leading to the Procedure:   ACS: Yes.    Non-ACS:    Anginal Classification: CCS IV  Anti-ischemic medical therapy: Minimal Therapy (1 class of medications)  Non-Invasive Test Results: No non-invasive testing performed  Prior CABG: No previous CABG      History and Physical Interval Note:  05/22/2015 7:41 AM  [name]  has presented today for surgery, with the diagnosis of unstable angina  The various methods of treatment have been discussed with the patient and family. After consideration of risks, benefits and other options for treatment, the patient has consented to  Procedure(s): Left Heart Cath and Coronary Angiography (N/A) as a surgical intervention .  The patient's history has been reviewed, patient examined, no change in status, stable for surgery.  I have reviewed the patient's chart and labs.  Questions were answered to the patient's satisfaction.     Ian Guarisco S.

## 2015-05-22 NOTE — Progress Notes (Signed)
Pt on call to cath lab. Voided. VS obtained Pt transferred to cath lab via bed and off monitor .

## 2015-05-23 ENCOUNTER — Encounter (HOSPITAL_COMMUNITY): Payer: Self-pay | Admitting: Interventional Cardiology

## 2015-05-23 ENCOUNTER — Telehealth: Payer: Self-pay

## 2015-05-23 DIAGNOSIS — I214 Non-ST elevation (NSTEMI) myocardial infarction: Principal | ICD-10-CM

## 2015-05-23 LAB — CBC
HCT: 47.6 % (ref 39.0–52.0)
Hemoglobin: 16.3 g/dL (ref 13.0–17.0)
MCH: 33.1 pg (ref 26.0–34.0)
MCHC: 34.2 g/dL (ref 30.0–36.0)
MCV: 96.6 fL (ref 78.0–100.0)
PLATELETS: 123 10*3/uL — AB (ref 150–400)
RBC: 4.93 MIL/uL (ref 4.22–5.81)
RDW: 12.1 % (ref 11.5–15.5)
WBC: 8.1 10*3/uL (ref 4.0–10.5)

## 2015-05-23 LAB — BASIC METABOLIC PANEL
Anion gap: 7 (ref 5–15)
BUN: 17 mg/dL (ref 6–20)
CHLORIDE: 100 mmol/L — AB (ref 101–111)
CO2: 31 mmol/L (ref 22–32)
CREATININE: 0.97 mg/dL (ref 0.61–1.24)
Calcium: 9.9 mg/dL (ref 8.9–10.3)
GFR calc Af Amer: >60 mL/min (ref >60)
GFR calc non Af Amer: >60 mL/min (ref >60)
GLUCOSE: 251 mg/dL — AB (ref 65–99)
Potassium: 4.6 mmol/L (ref 3.5–5.1)
Sodium: 138 mmol/L (ref 135–145)

## 2015-05-23 LAB — GLUCOSE, CAPILLARY: Glucose-Capillary: 259 mg/dL — ABNORMAL HIGH (ref 65–99)

## 2015-05-23 LAB — HEMOGLOBIN A1C
HEMOGLOBIN A1C: 9.7 % — AB (ref 4.8–5.6)
MEAN PLASMA GLUCOSE: 232 mg/dL

## 2015-05-23 MED ORDER — APIXABAN 5 MG PO TABS: 5.0000 mg | ORAL_TABLET | Freq: Two times a day (BID) | ORAL | Status: DC

## 2015-05-23 MED ORDER — CLOPIDOGREL BISULFATE 75 MG PO TABS: 75.0000 mg | ORAL_TABLET | Freq: Every day | ORAL | Status: DC

## 2015-05-23 MED ORDER — METFORMIN HCL 500 MG PO TABS: 500.0000 mg | ORAL_TABLET | Freq: Two times a day (BID) | ORAL | Status: DC

## 2015-05-23 MED ORDER — LISINOPRIL 5 MG PO TABS
2.5000 mg | ORAL_TABLET | Freq: Every day | ORAL | Status: DC
  Administered 2015-05-23: 2.5 mg via ORAL
  Filled 2015-05-23: qty 1

## 2015-05-23 MED ORDER — OFF THE BEAT BOOK
Freq: Once | Status: AC
  Administered 2015-05-23: 09:00:00
  Filled 2015-05-23 (×2): qty 1

## 2015-05-23 MED ORDER — ASPIRIN 81 MG PO TBEC: 81.0000 mg | DELAYED_RELEASE_TABLET | Freq: Every day | ORAL | Status: DC

## 2015-05-23 MED ORDER — DILTIAZEM HCL ER COATED BEADS 120 MG PO CP24: 120.0000 mg | ORAL_CAPSULE | Freq: Every day | ORAL | Status: DC

## 2015-05-23 MED ORDER — LISINOPRIL 2.5 MG PO TABS: 2.5000 mg | ORAL_TABLET | Freq: Every day | ORAL | Status: DC

## 2015-05-23 MED ORDER — METOPROLOL TARTRATE 25 MG PO TABS: 12.5000 mg | ORAL_TABLET | Freq: Two times a day (BID) | ORAL | Status: DC

## 2015-05-23 MED ORDER — NITROGLYCERIN 0.4 MG SL SUBL: 0.4000 mg | SUBLINGUAL_TABLET | SUBLINGUAL | Status: DC | PRN

## 2015-05-23 MED ORDER — ATORVASTATIN CALCIUM 80 MG PO TABS: 80.0000 mg | ORAL_TABLET | Freq: Every day | ORAL | Status: DC

## 2015-05-23 NOTE — Progress Notes (Signed)
Subjective: No CP or SOB  Objective: Vital signs in last 24 hours: Temp:  [97.5 F (36.4 C)-98.1 F (36.7 C)] 97.5 F (36.4 C) (10/11 0343) Pulse Rate:  [58-151] 66 (10/11 0343) Resp:  [10-21] 13 (10/11 0343) BP: (88-143)/(61-117) 119/82 mmHg (10/11 0343) SpO2:  [92 %-99 %] 95 % (10/11 0343) Weight:  [254 lb 3.1 oz (115.3 kg)] 254 lb 3.1 oz (115.3 kg) (10/11 0343) Last BM Date: 05/22/15  Intake/Output from previous day: 10/10 0701 - 10/11 0700 In: 1088.8 [P.O.:480; I.V.:608.8] Out: -  Intake/Output this shift:    Medications Scheduled Meds: . apixaban  5 mg Oral BID  . aspirin EC  81 mg Oral Daily  . atorvastatin  80 mg Oral q1800  . clopidogrel  300 mg Oral Daily  . [START ON 05/24/2015] clopidogrel  75 mg Oral Daily  . diltiazem  120 mg Oral Daily  . insulin aspart  0-15 Units Subcutaneous TID WC  . metoprolol tartrate  12.5 mg Oral BID  . nicotine  21 mg Transdermal Daily  . off the beat book   Does not apply Once  . sodium chloride  3 mL Intravenous Q12H   Continuous Infusions:  PRN Meds:.sodium chloride, acetaminophen, acetaminophen, ALPRAZolam, nitroGLYCERIN, ondansetron (ZOFRAN) IV, ondansetron (ZOFRAN) IV, sodium chloride  PE: General appearance: alert, cooperative and no distress Lungs: clear to auscultation bilaterally Heart: irregularly irregular rhythm and No MM Extremities: No LEE Pulses: 2+ and symmetric Skin: Warm and dry Neurologic: Grossly normal  Lab Results:   Recent Labs  05/21/15 0920 05/22/15 0233 05/22/15 1634 05/23/15 0354  WBC 8.3 7.5  --  8.1  HGB 18.0* 16.5  --  16.3  HCT 51.0 46.5  --  47.6  PLT 121* 114* 134* 123*   BMET  Recent Labs  05/21/15 0920 05/22/15 0233 05/23/15 0354  NA 132* 135 138  K 5.1 4.1 4.6  CL 97* 102 100*  CO2 GLUCOSE 340* 210* 251*  BUN 21* 15 17  CREATININE 1.01 0.91 0.97  CALCIUM 9.5 9.0 9.9   PT/INR  Recent Labs  05/22/15 0354  LABPROT 13.8  INR 1.04    Cholesterol  Recent Labs  05/22/15 0233  CHOL 214*   Lipid Panel     Component Value Date/Time   CHOL 214* 05/22/2015 0233   TRIG 404* 05/22/2015 0233   HDL 27* 05/22/2015 0233   CHOLHDL 7.9 05/22/2015 0233   VLDL UNABLE TO CALCULATE IF TRIGLYCERIDE OVER 400 mg/dL 16/05/9603 5409   LDLCALC UNABLE TO CALCULATE IF TRIGLYCERIDE OVER 400 mg/dL 81/19/1478 2956      Assessment/Plan  NSTEMI Trop 0.66. On IV heparin, metoprolol, cardizem. SP LHC revealing 90% mid RCA.  He underwent successful PCI of the mid RCA lesion with a 3.5 x 16 Synergy drug-eluting stent, postdilated to greater than 4 mm in diameter.  25% mid Circ lesion.  ASA, Plavix, lopressor, statin   New onset atrial fibrillation (HCC) Probably related to RCA disease.  He converted to NSR yesterday around 1400hrs.  CHADS-VASc 3.  He briefly converted to sinus brady at 0240hrs then went back into afib.       Cardizem 120, lopressor 12.5 BID.    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  I suspect EF will improve.    Hyperlipidemia TG 404. On lipitor 80 now. He was not on anything prior. Needs lipids and LFTs in 6-8 weeks  Type 2 diabetes mellitus (HCC) A1C 9.7.  Only on metformin prior to admission. SS insulin now. Will need follow up with PCP at home to start insulin   Tobacco abuse: cessation advised  Unstable angina (HCC)  Obesity: Sleep apnea?     LOS: 2 days    Bhavya Grand [name] 05/23/2015 8:07 AM

## 2015-05-23 NOTE — Progress Notes (Signed)
CARDIAC REHAB PHASE I   PRE:  Rate/Rhythm: 66 SR    BP: sitting 138/84    SaO2:   MODE:  Ambulation: 1000 ft   POST:  Rate/Rhythm: 86 SR    BP: sitting 157/111, 163/102, 30 min later 166/88     SaO2:   Tolerated well, no c/o. Only issue is BP very elevated after walk with delay in decreasing (see above). Pt sts he has not received meds yet today. Ed completed. Pt voices desire for change. He is interested in [employer] supplements. Encouraged him to discuss this with his MD. 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 Pt plans to quit smoking. Gave resources and fake cigarette. [id number]   Harriet Masson CES, ACSM 05/23/2015 9:44 AM

## 2015-05-23 NOTE — Telephone Encounter (Signed)
Ian [name] called from the hospital to give pt one month of Eliquis 5 mg, samples are at the check in desk

## 2015-05-23 NOTE — Progress Notes (Signed)
Consult placed for CM   Could you look into cost for apixiban  op bid.  CM called [facility] pharmacy and was told cash cost for apixiban  is $418.17, information share with pt and PA. CM provided pt with 30- day free card for Apixiban and pt assistance form for Apixiban.  Gae Gallop RN,BSN,CM [phone]

## 2015-05-23 NOTE — Discharge Summary (Signed)
Physician Discharge Summary     Cardiologist:  New.  He lives in [place] and [place]  Patient ID: Ian [name] MRN: [id number] DOB/AGE: Mar 23, 1971 44 y.o.  Admit date: 05/21/2015 Discharge date: 05/23/2015  Admission Diagnoses:  NSTEMI, Unstable angina  Discharge Diagnoses:  Active Problems:   Chest pain   New onset atrial fibrillation (HCC)   Hyperlipidemia   Type 2 diabetes mellitus (HCC)   Tobacco abuse   Unstable angina (HCC)   NSTEMI (non-ST elevated myocardial infarction) [facility])   Discharged Condition: stable  Hospital Course:  Ian [name] is a 44 y.o. male with past medical history of Type 2 Diabetes Mellitus, Hyperlipidemia, and tobacco abuse who presents to [facility] ED on 05/21/2015 for worsening chest pain over the past 4 days. Also found to be in atrial fibrillation upon arrival with rate in the 130's.  The patient reports he has experienced episodes of chest pain over the past few years, which are worse with physical exertion. Over the past four days, he reports the pain has been more frequent and has intensified in severity. He notes the pain is an intense pressure that he rates as a 10/10 at its worse. He reports the pain intensifies with activity such as walking and is relieved with rest. He has not taken anything for the pain, including OTC medications or SL NTG. He reports having shortness of breath with the pain, along with nausea and diaphoresis. He notes exertional dyspnea over the past few years, but has credited it to his significant tobacco abuse. He also notes having a dry cough which has developed over thepast few days. He denies any orthopnea or nocturnal dyspnea. His pain has been relieved while receiving NTG in the ED and now rates his pain as a 3/10.   When asked about palpitations, he reports having those "off and on" for several years. He denies any history of atrial fibrillation or other irregular heart rhythms.  He had cardiac catheterizations in 2000 and  2005 which he reports showed less than 20% blockages. He has never required stenting or balloon angioplasty.  While in the ED, CXR showed no acute disease. Glucose elevated to 340. Initial troponin negative. Initiaql EKG showing atrial fibrillation with RVR, rate in 130's. He was given Cardizem 20mg  and repeat EKG now shows atrial fibrillation with rate in 70's.  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 They reside in [place] and [place] but were recently visiting the patient's father in [place], [place] after completing a cruise in [place]. He reports having a PCP Ian Sam, MD) he sees in [place] for his DM and HLD. He currently takes Metformin and Lipitor. He reports having not taken his medications in the past few weeks due to forgetting to take them. Use to take Lisinopril in the past for HTN, but reports hie no longer has high blood pressure.  He ruled in for an as NSTEMI with peak troponin of 0.66.  He was started on IV heparin for both CAD and reduce stroke risk is atrial fibrillation.  He had a left heart catheterization which revealed revealed a 90% mid RCA lesion  which was successfully stented with a 3.5 x 16 Synergy drug-eluting stent, postdilated to greater than 4 mm in diameter. 25% mid Circ lesion(see cath report below). He was initially started on Brilinta however, because he is going to need triple therapy for 3 months, he was changed to Plavix. After 30 days he will drop  the aspirin and continue Eliquis and Plavix(12 months min).  He was also started on Lopressor and statin. Triglycerides were 404.  Echocardiogram revealed an ejection fraction of 45-50%. Low dose ACE inhibitor was started.  He was started on Cardizem and Eliquis for the atrial fibrillation.  CHADSVASC 3.  He converted normal sinus rhythm proximal 1400 hrs. on October 10.   A1C   9.7.  The patient was seen by Dr. Allyson Sabal who felt he was stable for DC home.  Patient will need to have LFTs  checked in 6-8 weeks since he is a new start to Lipitor.     Consults: None  Significant Diagnostic Studies:  Lipid Panel     Component Value Date/Time   CHOL 214* 05/22/2015 0233   TRIG 404* 05/22/2015 0233   HDL 27* 05/22/2015 0233   CHOLHDL 7.9 05/22/2015 0233   VLDL UNABLE TO CALCULATE IF TRIGLYCERIDE OVER 400 mg/dL 40/98/1191 4782   LDLCALC UNABLE TO CALCULATE IF TRIGLYCERIDE OVER 400 mg/dL 95/62/1308 6578     Echocardiogram Study Conclusions  - Left ventricle: The cavity size was normal. Wall thickness was normal. Systolic function was mildly reduced. The estimated ejection fraction was in the range of 45% to 50%. Diffuse hypokinesis. - Left atrium: The atrium was mildly dilated. - Right atrium: The atrium was mildly dilated.   Left heart Catheterization Procedures    Coronary Stent Intervention   Left Heart Cath and Coronary Angiography    Conclusion     Mild global left ventricular systolic dysfunction.  Successful PCI of the mid RCA lesion with a 3.5 x 16 Synergy drug-eluting stent, postdilated to greater than 4 mm in diameter.  Continue dual antiplatelet therapy for at least a year. Brilinta was given in the Cath Lab. If the patient will require long-term anticoagulation for his atrial fibrillation, would switch the Brilinta to clopidogrel. Would then stop aspirin and continue the anticoagulation for stroke prevention in setting of atrial fibrillation. If the decision is made to switch to clopidogrel, would give a 300 mg loading dose on the first day followed by 75 mg daily thereafter.  I stressed the importance of his antiplatelet therapy to the patient. He has had some issues with compliance in the past. Given his diabetes, a drug-eluting stent is clearly superior. He did express that he is willing to be compliant with his antiplatelet medication. 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  He needs to stop smoking as well along with other aggressive secondary prevention.     Indications    NSTEMI (non-ST elevated myocardial infarction) (HCC) [I21.4 (ICD-10-CM)]    Technique and Indications    The risks, benefits, and details of the procedure were explained to the patient. The patient verbalized understanding and wanted to proceed. Informed written consent was obtained.  PROCEDURE TECHNIQUE: After Xylocaine anesthesia a 82F slender sheath was placed in the right radial artery with a single anterior needle wall stick. IV Heparin was given. Right coronary angiography was done using a Judkins R4 guide catheter. Left coronary angiography was done using a Judkins L3.5 guide catheter. Left ventriculography was done using a pigtail catheter. A TR band was used for hemostasis after the intervention.  IV heparin was given. IV tirofiban was given. ACT was used to check that the anticoagulation was therapeutic. A Neff R4 guide was used to engage the RCA. A pro-water wire was placed across the area  of severe disease in the mid RCA. A 2.5 balloon was used to predilate. A 3.5 stent was advanced but would not cross. We then advanced a BMW wire for support across the area disease. We tried to advance the stent but would not cross. We then predilated with a 3.0 balloon. After this, the stent still did not cross. It appeared that there was a moderate amount of calcium in this lesion. A 2.5 x 10 Angiosculpt scoring balloon was then advanced. Multiple inflations were performed in the mid RCA. The result of the stenosis was significantly improved. The 3.5 x 16 Synergy drug-eluting stent was then successfully laced across the lesion. The BMW wire was removed. The stent was inflated to high pressure. A 4.0 x 8 noncompliant balloon was used to post dilate the stent. There was an excellent angiographic result. The patient tolerated the procedure well  Contrast: 135 ml  Estimated blood loss: 30  mlEstimated blood loss <50 mL. There were no immediate complications during the procedure.    Coronary Findings    Dominance: Right   Left Anterior Descending  The vessel exhibits minimal luminal irregularities.     Left Circumflex   . Mid Cx lesion, 25% stenosed.     Right Coronary Artery  There is mild the vessel.   . Prox RCA lesion, 35% stenosed.   . Mid RCA lesion, 90% stenosed. The lesion is type C moderately calcified. The lesion was not previously treated.   Marland Kitchen PCI: The pre-interventional distal flow is normal (TIMI 3). Pre-stent angioplasty was performed. A drug-eluting stent was placed. The strut is apposed. Post-stent angioplasty was performed. The post-interventional distal flow is normal (TIMI 3). The intervention was successful. No complications occurred at this lesion.  . Supplies used: BALLOON ANGIOSCULPT RX 2.5X10; BALLOON EMERGE MR 3.0X15; BALLOON EUPHORA RX2.5X12; STENT SYNERGY DES 3.5X16; BALLOON [place] EUPHORA RX4.0X8  . There is no residual stenosis post intervention.        Left Heart    Left Ventricle The left ventricle is enlarged. There is mild left ventricular systolic dysfunction. The left ventricular ejection fraction is 45-50% by visual estimate. There are wall motion abnormalities in the left ventricle. There is global hypokinesis of the left ventricle.   Aortic Valve There is no aortic valve stenosis.    Coronary Diagrams    Diagnostic Diagram           Post-Intervention Diagram            Implants    Name ID Temporary Type Supply   STENT SYNERGY DES 3.5X16 - [id number] No Permanent Stent STENT SYNERGY DES 3.5X16    PACS Images    Show images for Cardiac catheterization     Link to Procedure Log    Procedure Log      Hemo Data       Most Recent Value   AO Systolic Pressure  109 mmHg   AO Diastolic Pressure  75 mmHg   AO Mean  93 mmHg   LV Systolic Pressure  110 mmHg   LV Diastolic Pressure  7 mmHg    LV EDP  10 mmHg   Arterial Occlusion Pressure Extended Systolic Pressure  110 mmHg   Arterial Occlusion Pressure Extended Diastolic Pressure  72 mmHg   Arterial Occlusion Pressure Extended Mean Pressure  88 mmHg   Left Ventricular Apex Extended Systolic Pressure  110 mmHg   Left Ventricular Apex Extended Diastolic Pressure  0 mmHg   Left Ventricular Apex  Extended EDP Pressure  8 mmHg    Treatments: See above  Discharge Exam: Blood pressure 119/82, pulse 66, temperature 97.5 F (36.4 C), temperature source Oral, resp. rate 13, height  (1.803 m), weight 254 lb 3.1 oz (115.3 kg), SpO2 95 %.   Disposition: Final discharge disposition not confirmed      Discharge Instructions    Diet - low sodium heart healthy    Complete by:  As directed      Discharge instructions    Complete by:  As directed   No lifting with right arm for three days     Increase activity slowly    Complete by:  As directed             Medication List    TAKE these medications        apixaban 5 MG Tabs tablet  Commonly known as:  ELIQUIS  Take 1 tablet (5 mg total) by mouth 2 (two) times daily.     aspirin 81 MG EC tablet  Take 1 tablet (81 mg total) by mouth daily.     atorvastatin 80 MG tablet  Commonly known as:  LIPITOR  Take 1 tablet (80 mg total) by mouth daily at 6 PM.     clopidogrel 75 MG tablet  Commonly known as:  PLAVIX  Take 1 tablet (75 mg total) by mouth daily.  Start taking on:  05/24/2015     diltiazem 120 MG 24 hr capsule  Commonly known as:  CARDIZEM CD  Take 1 capsule (120 mg total) by mouth daily.     lisinopril 2.5 MG tablet  Commonly known as:  PRINIVIL,ZESTRIL  Take 1 tablet (2.5 mg total) by mouth daily.     metFORMIN 500 MG tablet  Commonly known as:  GLUCOPHAGE  Take 1 tablet (500 mg total) by mouth 2 (two) times daily with a meal.     metoprolol tartrate 25 MG tablet  Commonly known as:  LOPRESSOR  Take 0.5 tablets (12.5 mg total) by mouth 2 (two)  times daily.     nitroGLYCERIN 0.4 MG SL tablet  Commonly known as:  NITROSTAT  Place 1 tablet (0.4 mg total) under the tongue every 5 (five) minutes x 3 doses as needed for chest pain.       Follow-up Information    Schedule an appointment as soon as possible for a visit with Cardiologist.      Schedule an appointment as soon as possible for a visit with Primary Care provider.     Greater than 30 minutes was spent completing the patient's discharge.    SignedWilburt Finlay, PAC 05/23/2015, 8:41 AM   Agree with note written by Jones Skene PAC  POD #1 DES RCA in setting of PAF and NSTEMI. Mild decrease LV Fxn. NSR now. Right wrist OK. No CP. Transitioned from [place] to Plavix. OK for DC home on ASA 81, Plavix 75 and Eliquis. DC ASA in 1 month. OK for DC. Discussed tobacco  cessation and importance of medication compliance.   Nanetta Batty 05/23/2015 9:00 AM

## 2015-10-17 ENCOUNTER — Other Ambulatory Visit: Payer: Self-pay | Admitting: Physician Assistant

## 2015-11-03 ENCOUNTER — Telehealth: Payer: Self-pay | Admitting: Cardiology

## 2015-11-03 ENCOUNTER — Other Ambulatory Visit: Payer: Self-pay | Admitting: *Deleted

## 2015-11-03 ENCOUNTER — Telehealth: Payer: Self-pay | Admitting: *Deleted

## 2015-11-03 DIAGNOSIS — R079 Chest pain, unspecified: Secondary | ICD-10-CM

## 2015-11-03 NOTE — Telephone Encounter (Signed)
Per message from Dr. Delton SeeNelson.  Will schedule him for ETT.  [place] and [place].  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  Hx of CP w/Stent last October 2016.

## 2015-11-03 NOTE — Telephone Encounter (Signed)
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 He says that doctor Delton Seeelson saw him in the hospital to place a stent on 05/2015. Please f/u with her

## 2015-11-03 NOTE — Telephone Encounter (Signed)
-----   Message from Lars MassonKatarina H [name], MD sent at 11/03/2015  2:51 PM EDT ----- Please schedule an exercise treadmill stress test for the next week.

## 2015-11-03 NOTE — Telephone Encounter (Signed)
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

## 2015-11-08 ENCOUNTER — Ambulatory Visit (INDEPENDENT_AMBULATORY_CARE_PROVIDER_SITE_OTHER): Payer: No Typology Code available for payment source

## 2015-11-08 DIAGNOSIS — R079 Chest pain, unspecified: Secondary | ICD-10-CM

## 2015-11-08 LAB — EXERCISE TOLERANCE TEST
Estimated workload: 6.4 METS
Exercise duration (min): 4 min
Exercise duration (sec): 30 s
MPHR: 174 {beats}/min
Peak HR: 148 {beats}/min
Percent HR: 89 %
Percent of predicted max HR: 85 %
RPE: 16
Rest HR: 109 {beats}/min
Stage 1 DBP: 90 mmHg
Stage 1 Grade: 0 %
Stage 1 HR: 113 {beats}/min
Stage 1 SBP: 118 mmHg
Stage 1 Speed: 0 mph
Stage 2 Grade: 0 %
Stage 2 HR: 112 {beats}/min
Stage 2 Speed: 1 mph
Stage 3 Grade: 0 %
Stage 3 HR: 109 {beats}/min
Stage 3 Speed: 1 mph
Stage 4 DBP: 87 mmHg
Stage 4 Grade: 10 %
Stage 4 HR: 144 {beats}/min
Stage 4 SBP: 157 mmHg
Stage 4 Speed: 1.7 mph
Stage 5 Grade: 12 %
Stage 5 HR: 148 {beats}/min
Stage 5 Speed: 2.5 mph
Stage 6 DBP: 83 mmHg
Stage 6 Grade: 0 %
Stage 6 HR: 136 {beats}/min
Stage 6 SBP: 167 mmHg
Stage 6 Speed: 0 mph
Stage 7 DBP: 82 mmHg
Stage 7 Grade: 0 %
Stage 7 HR: 117 {beats}/min
Stage 7 SBP: 115 mmHg
Stage 7 Speed: 0 mph

## 2015-11-09 ENCOUNTER — Telehealth: Payer: Self-pay | Admitting: *Deleted

## 2015-11-09 DIAGNOSIS — I214 Non-ST elevation (NSTEMI) myocardial infarction: Secondary | ICD-10-CM

## 2015-11-09 DIAGNOSIS — I4891 Unspecified atrial fibrillation: Secondary | ICD-10-CM

## 2015-11-09 DIAGNOSIS — E785 Hyperlipidemia, unspecified: Secondary | ICD-10-CM

## 2015-11-09 NOTE — Telephone Encounter (Signed)
-----   Message from Lars MassonKatarina H [name], MD sent at 11/09/2015  2:07 PM EDT ----- Normal stress test

## 2015-11-09 NOTE — Telephone Encounter (Signed)
Notified the pt that per Dr Delton SeeNelson, his stress test was normal.  Pt verbalized understanding.  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  Informed the pt that I will route this message to Dr Delton SeeNelson for further review of requested lab orders, and follow-up with the pt thereafter.  Pt verbalized understanding and agrees with this plan. Pt gracious for all the assistance provided.

## 2015-11-10 ENCOUNTER — Telehealth: Payer: Self-pay | Admitting: Cardiology

## 2015-11-10 ENCOUNTER — Other Ambulatory Visit (INDEPENDENT_AMBULATORY_CARE_PROVIDER_SITE_OTHER): Payer: No Typology Code available for payment source | Admitting: *Deleted

## 2015-11-10 DIAGNOSIS — E785 Hyperlipidemia, unspecified: Secondary | ICD-10-CM

## 2015-11-10 DIAGNOSIS — I4891 Unspecified atrial fibrillation: Secondary | ICD-10-CM

## 2015-11-10 DIAGNOSIS — I214 Non-ST elevation (NSTEMI) myocardial infarction: Secondary | ICD-10-CM

## 2015-11-10 LAB — COMPREHENSIVE METABOLIC PANEL
ALT: 32 U/L (ref 9–46)
AST: 18 U/L (ref 10–40)
Albumin: 4.5 g/dL (ref 3.6–5.1)
Alkaline Phosphatase: 89 U/L (ref 40–115)
BUN: 14 mg/dL (ref 7–25)
CO2: 26 mmol/L (ref 20–31)
Calcium: 9.3 mg/dL (ref 8.6–10.3)
Chloride: 101 mmol/L (ref 98–110)
Creat: 0.81 mg/dL (ref 0.60–1.35)
Glucose, Bld: 122 mg/dL — ABNORMAL HIGH (ref 65–99)
Potassium: 4.1 mmol/L (ref 3.5–5.3)
Sodium: 138 mmol/L (ref 135–146)
Total Bilirubin: 1 mg/dL (ref 0.2–1.2)
Total Protein: 6.7 g/dL (ref 6.1–8.1)

## 2015-11-10 LAB — HEMOGLOBIN A1C
Hgb A1c MFr Bld: 8.4 % — ABNORMAL HIGH (ref <5.7)
Mean Plasma Glucose: 194 mg/dL

## 2015-11-10 NOTE — Telephone Encounter (Signed)
Pt and wife calling to request Eliquis samples to be picked up today, when the pt comes in for lab work.  Pt has established care here, and just moved to [place].  Informed both parties that there will be samples of Eliquis 5 mg for him to pick up at the front desk, when he arrives for his appt.  Both verbalized understanding and gracious for all the assistance provided.

## 2015-11-10 NOTE — Telephone Encounter (Signed)
Yes, please check CMP, HbA1c

## 2015-11-10 NOTE — Telephone Encounter (Signed)
Notified the pt that per Dr Delton SeeNelson, we can add a cmet and hemoglobinA1C lab to done here at our office.  Pt wants his lab appt to be scheduled for today.  Scheduled the pt lab appt for today 3/31 at our office to check a cmet and hemoglobin A1C.  Pt verbalized understanding and agrees with this plan.  Pt gracious for all the assistance provided.

## 2015-11-10 NOTE — Telephone Encounter (Signed)
F.u  Pt wife had additional questions. Please call back and discuss.

## 2015-11-13 ENCOUNTER — Encounter: Payer: Self-pay | Admitting: *Deleted

## 2015-11-22 ENCOUNTER — Telehealth: Payer: Self-pay | Admitting: Cardiology

## 2015-11-22 NOTE — Telephone Encounter (Signed)
Returned call to patient's wife.She stated they recently moved to this area 3 weeks ago.Stated Eliquis too expensive.[facility].Advised I will [facility] to [facility] for a prior authorization or patient assistance.  Stated she needs to schedule husband appointment.He has never been seen since he was in hospital 05/23/15.Appointment scheduled with Dr.[name] 12/19/15 at 8:45 am.

## 2015-11-22 NOTE — Telephone Encounter (Signed)
New message     Pt received a card for eliquis about 2 weeks.  He used that card at [facility].  He is almost out again.  He got another card.  Can he go to another pharmacy to use the card?  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  It is 500.00. He tried to go back to [facility], but they said it was a once in a lifetime use but he is thinking he can go to another pharmacist.  Please call wife

## 2015-11-27 NOTE — Telephone Encounter (Signed)
Per [facility], a PA was not necessary for Eliquis.

## 2015-12-14 ENCOUNTER — Encounter: Payer: Self-pay | Admitting: *Deleted

## 2015-12-19 ENCOUNTER — Encounter: Payer: Self-pay | Admitting: Cardiology

## 2015-12-19 ENCOUNTER — Ambulatory Visit (INDEPENDENT_AMBULATORY_CARE_PROVIDER_SITE_OTHER): Payer: No Typology Code available for payment source | Admitting: Cardiology

## 2015-12-19 VITALS — BP 130/90 | HR 70 | Ht 70.0 in | Wt 265.8 lb

## 2015-12-19 DIAGNOSIS — I214 Non-ST elevation (NSTEMI) myocardial infarction: Secondary | ICD-10-CM | POA: Diagnosis not present

## 2015-12-19 DIAGNOSIS — I5021 Acute systolic (congestive) heart failure: Secondary | ICD-10-CM | POA: Insufficient documentation

## 2015-12-19 DIAGNOSIS — I10 Essential (primary) hypertension: Secondary | ICD-10-CM

## 2015-12-19 DIAGNOSIS — I2510 Atherosclerotic heart disease of native coronary artery without angina pectoris: Secondary | ICD-10-CM | POA: Insufficient documentation

## 2015-12-19 DIAGNOSIS — I2583 Coronary atherosclerosis due to lipid rich plaque: Secondary | ICD-10-CM

## 2015-12-19 DIAGNOSIS — Z79899 Other long term (current) drug therapy: Secondary | ICD-10-CM | POA: Insufficient documentation

## 2015-12-19 DIAGNOSIS — I480 Paroxysmal atrial fibrillation: Secondary | ICD-10-CM | POA: Diagnosis not present

## 2015-12-19 DIAGNOSIS — E785 Hyperlipidemia, unspecified: Secondary | ICD-10-CM | POA: Diagnosis not present

## 2015-12-19 DIAGNOSIS — I255 Ischemic cardiomyopathy: Secondary | ICD-10-CM | POA: Insufficient documentation

## 2015-12-19 HISTORY — DX: Essential (primary) hypertension: I10

## 2015-12-19 MED ORDER — FUROSEMIDE 20 MG PO TABS: 20.0000 mg | ORAL_TABLET | Freq: Every day | ORAL | Status: DC

## 2015-12-19 MED ORDER — APIXABAN 5 MG PO TABS: 5.0000 mg | ORAL_TABLET | Freq: Two times a day (BID) | ORAL | Status: DC

## 2015-12-19 MED ORDER — ASPIRIN EC 81 MG PO TBEC: 81.0000 mg | DELAYED_RELEASE_TABLET | Freq: Every day | ORAL | Status: DC

## 2015-12-19 NOTE — Patient Instructions (Addendum)
Medication Instructions:   START TAKING YOUR ELIQUIS 5 MG TWICE DAILY  START TAKING LASIX 20 MG ONCE DAILY    Labwork:  PRIOR TO YOUR 3 MONTH FOLLOW-UP APPOINTMENT WITH DR [name]--TO CHECK--BMET AND CBC W DIFF    Testing/Procedures:  Your physician has requested that you have an echocardiogram. Echocardiography is a painless test that uses sound waves to create images of your heart. It provides your doctor with information about the size and shape of your heart and how well your heart's chambers and valves are working. This procedure takes approximately one hour. There are no restrictions for this procedure.  HAVE YOUR ECHO SCHEDULED PRIOR TO YOUR 3 MONTH FOLLOW-UP APPOINTMENT WITH DR Delton SeeNELSON    Follow-Up:  3 MONTHS WITH DR Delton SeeNELSON-  PLEASE HAVE YOUR ECHO AND YOUR LAB APPOINTMENT SCHEDULED PRIOR TO THIS APPOINTMENT       If you need a refill on your cardiac medications before your next appointment, please call your pharmacy.

## 2015-12-19 NOTE — Progress Notes (Signed)
Cardiology Office Note    Date:  12/19/2015   ID:  Ian [name], DOB January 25, 1971, MRN [id number]  PCP:  Ian March, MD  Cardiologist:   Ian Masson, MD   Chief Complaint  Patient presents with  . Follow-up    post hsp f/u    History of Present Illness:  Ian [name] is a 45 y.o. male with past medical history of Type 2 Diabetes Mellitus, Hyperlipidemia, and tobacco abuse who presents to [facility] ED on 05/21/2015 with non-STEMI and A. fib with RVR, his initial presentation of her palpitations and chest pain.   He ruled in for an as NSTEMI with peak troponin of 0.66, a left heart catheterization which revealed revealed a 90% mid RCA lesion which was successfully stented with a 3.5 x 16 Synergy drug-eluting stent, postdilated to greater than 4 mm in diameter. 25% mid Circ lesion(see cath report below). He was initially started on Brilinta however, because he is going to need triple therapy for 3 months, he was changed to Plavix. After 30 days he will drop the aspirin and continue Eliquis and Plavix(12 months min). He was also started on Lopressor and statin. Triglycerides were 404. Echocardiogram revealed an ejection fraction of 45-50%. Low dose ACE inhibitor was started. He was started on Cardizem and Eliquis for the atrial fibrillation. CHADSVASC 3. He converted normal sinus rhythm proximal 1400 hrs. on October 10.  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 They reside in [place] and [place] but were recently visiting the patient's father in [place], [place] after completing a cruise in [place]. He reports having a PCP Ian Sam, MD) he sees in [place] for his DM and HLD. He currently takes Metformin and Lipitor. He reports having not taken his medications in the past few weeks due to forgetting to take them. Use to take Lisinopril in the past for HTN, but reports hie no longer has high blood pressure.  12/19/2015, 6 months  follow-up patient feels well denies any palpitations and chest pain how he experienced them at the time of his heart attack. He feels left-sided shoulder pain but he attributes it to muscular pain as it happen after he was carrying heavy stuff or when he was on elevating his left arm. The patient is compliant with his meds, denies any bleeding or muscle pain from atorvastatin. Today he complains of lower extremity edema but no orthopnea or paroxysmal nocturnal dyspnea. In [name] 2017 underwent exercise treadmill stress test for DOT physical and it was negative.   Past Medical History  Diagnosis Date  . HLD (hyperlipidemia)   . Complication of anesthesia     "hard to get me out of it sometimes; usually takes me longer to get under"  . Coronary artery disease   . Hypertension   . Type II diabetes mellitus (HCC)   . Arthritis     "left ankle" (05/22/2015)  . PAF (paroxysmal atrial fibrillation) (HCC)   . History of palpitations     Past Surgical History  Procedure Laterality Date  . Multiple tooth extractions  2013    "all of them"  . Cardiac catheterization  2000, 2005    "less than 20% blockage"  . Coronary angioplasty with stent placement  05/22/2015    "1 stent"  . Cardiac catheterization N/A 05/22/2015    Procedure: Left Heart Cath and Coronary Angiography;  Surgeon: [name], MD;  Location: [facility] INVASIVE CV LAB;  Service: Cardiovascular;  Laterality: N/A;  . Cardiac catheterization N/A 05/22/2015    Procedure: Coronary Stent Intervention;  Surgeon: [name], MD;  Location: [facility] INVASIVE CV LAB;  Service: Cardiovascular;  Laterality: N/A;    Current Medications: Outpatient Prescriptions Prior to Visit  Medication Sig Dispense Refill  . atorvastatin (LIPITOR) 80 MG tablet Take 1 tablet (80 mg total) by mouth daily at 6 PM. 30 tablet 11  . clopidogrel (PLAVIX) 75 MG tablet Take 1 tablet (75 mg total) by mouth daily. 30 tablet 12  . diltiazem (CARDIZEM CD) 120 MG  24 hr capsule Take 1 capsule (120 mg total) by mouth daily. 30 capsule 11  . lisinopril (PRINIVIL,ZESTRIL) 2.5 MG tablet Take 1 tablet (2.5 mg total) by mouth daily. 30 tablet 11  . metFORMIN (GLUCOPHAGE) 500 MG tablet Take 1 tablet (500 mg total) by mouth 2 (two) times daily with a meal.    . metoprolol tartrate (LOPRESSOR) 25 MG tablet Take 0.5 tablets (12.5 mg total) by mouth 2 (two) times daily. 60 tablet 5  . nitroGLYCERIN (NITROSTAT) 0.4 MG SL tablet Place 1 tablet (0.4 mg total) under the tongue every 5 (five) minutes x 3 doses as needed for chest pain. 25 tablet 12  . ELIQUIS 5 MG TABS tablet TAKE ONE TABLET BY MOUTH TWICE DAILY 60 tablet 0   No facility-administered medications prior to visit.     Allergies:   Review of patient's allergies indicates no known allergies.   Social History   Social History  . Marital Status: Married    Spouse Name: N/A  . Number of Children: N/A  . Years of Education: N/A   Social History Main Topics  . Smoking status: Current Every Day Smoker -- 2.00 packs/day for 35 years    Types: Cigarettes  . Smokeless tobacco: Never Used  . Alcohol Use: 0.0 oz/week    0 Standard drinks or equivalent per week     Comment: 05/22/2015 "might have a few drinks/year"  . Drug Use: Yes    Special: Cocaine, Marijuana     Comment: "stopped in 1988"  . Sexual Activity: Yes   Other Topics Concern  . None   Social History Narrative   Employed as a Naval architect. Resides in [place] and [place].     Family History:  The patient's family history includes Diabetes in his maternal grandmother, mother, and sister; Heart attack in his father.   ROS:   Please see the history of present illness.    ROS All other systems reviewed and are negative.   PHYSICAL EXAM:   VS:  BP 130/90 mmHg  Pulse 70  Ht  (1.778 m)  Wt 265 lb 12.8 oz (120.566 kg)  BMI 38.14 kg/m2  SpO2 97%   GEN: Well nourished, well developed, in no acute distress HEENT: normal Neck: no  JVD, carotid bruits, or masses Cardiac: RRR; no murmurs, rubs, or gallops,no edema  Respiratory:  clear to auscultation bilaterally, normal work of breathing GI: soft, nontender, nondistended, + BS MS: no deformity or atrophy Skin: warm and dry, no rash Neuro:  Alert and Oriented x 3, Strength and sensation are intact Psych: euthymic mood, full affect  Wt Readings from Last 3 Encounters:  12/19/15 265 lb 12.8 oz (120.566 kg)  05/23/15 254 lb 3.1 oz (115.3 kg)      Studies/Labs Reviewed:   EKG:  EKG is ordered today.  The ekg ordered today demonstrates Normal sinus rhythm, normal EKG.  Recent Labs: 05/23/2015: Hemoglobin 16.3;  Platelets 123* 11/10/2015: ALT 32; BUN 14; Creat 0.81; Potassium 4.1; Sodium 138   Lipid Panel    Component Value Date/Time   CHOL 214* 05/22/2015 0233   TRIG 404* 05/22/2015 0233   HDL 27* 05/22/2015 0233   CHOLHDL 7.9 05/22/2015 0233   VLDL UNABLE TO CALCULATE IF TRIGLYCERIDE OVER 400 mg/dL 69/62/952810/05/2015 41320233   LDLCALC UNABLE TO CALCULATE IF TRIGLYCERIDE OVER 400 mg/dL 44/01/027210/05/2015 53660233   TTE: 05/2015 Echocardiogram Study Conclusions  - Left ventricle: The cavity size was normal. Wall thickness was normal. Systolic function was mildly reduced. The estimated ejection fraction was in the range of 45% to 50%. Diffuse hypokinesis. - Left atrium: The atrium was mildly dilated. - Right atrium: The atrium was mildly dilated.    ASSESSMENT:    1. Hyperlipidemia   2. PAF (paroxysmal atrial fibrillation) (HCC)   3. NSTEMI (non-ST elevated myocardial infarction) (HCC)   4. Encounter for long-term (current) use of other high-risk medications   5. Coronary artery disease due to lipid rich plaque   6. Essential hypertension   7. Ischemic cardiomyopathy      PLAN:   The patient is doing well from CAD standpoint, we'll continue the same management, recent stress test negative. His LVEF 45-50% we will repeat again prior to the next  appointment. We'll continue Plavix, Eliquis, atorvastatin lisinopril and metoprolol. His blood pressure is well controlled. History tolerating atorvastatin well and his repeat LFTs were normal. We will repeat again prior to next appointment.  The patient has mild acute systolic CHF is lower extremity edema and he'll be given 20 mg of by mouth Lasix daily, we'll repeat creatinine and electrolytes prior to next visit.  I have discussed long-term anticoagulation with the patient he preferred not to be on it is expensive, however when he came in for exercise treadmill stress test he was in A. fib and was asymptomatic so the concern is that he goes in and out of A. fib and his CHADS-VASc 3. Might consider discontinuation in the future.   Medication Adjustments/Labs and Tests Ordered: Current medicines are reviewed at length with the patient today.  Concerns regarding medicines are outlined above.  Medication changes, Labs and Tests ordered today are listed in the Patient Instructions below. Patient Instructions  Medication Instructions:   STOP TAKING ELIQUIS NOW  START TAKING ASPIRIN 81 MG ONCE DAILY  START TAKING LASIX 20 MG ONCE DAILY    Labwork:  PRIOR TO YOUR 3 MONTH FOLLOW-UP APPOINTMENT WITH DR Abbygayle Helfand--TO CHECK--BMET AND CBC W DIFF    Testing/Procedures:  Your physician has requested that you have an echocardiogram. Echocardiography is a painless test that uses sound waves to create images of your heart. It provides your doctor with information about the size and shape of your heart and how well your heart's chambers and valves are working. This procedure takes approximately one hour. There are no restrictions for this procedure.  HAVE YOUR ECHO SCHEDULED PRIOR TO YOUR 3 MONTH FOLLOW-UP APPOINTMENT WITH DR Delton SeeNELSON    Follow-Up:  3 MONTHS WITH DR Delton SeeNELSON-  PLEASE HAVE YOUR ECHO AND YOUR LAB APPOINTMENT SCHEDULED PRIOR TO THIS APPOINTMENT       If you need a refill on your  cardiac medications before your next appointment, please call your pharmacy.       Signed, Ian MassonNELSON, Keianna Signer H, MD  12/19/2015 11:05 AM    [facility] [address], [place], [place]  [address] Phone: [phone]; Fax: [phone]

## 2015-12-25 ENCOUNTER — Other Ambulatory Visit: Payer: Self-pay | Admitting: *Deleted

## 2015-12-25 MED ORDER — CLOPIDOGREL BISULFATE 75 MG PO TABS: 75.0000 mg | ORAL_TABLET | Freq: Every day | ORAL | Status: DC

## 2015-12-25 NOTE — Telephone Encounter (Signed)
needs plavix, should have 12 refills on file, will call and check for pt. will update information as to what is going on.  [place] [facility] received refill, [facility]. Called pt they are aware.

## 2016-02-05 ENCOUNTER — Telehealth: Payer: Self-pay | Admitting: Cardiology

## 2016-02-05 ENCOUNTER — Telehealth: Payer: Self-pay

## 2016-02-05 NOTE — Telephone Encounter (Signed)
Tier exception for Eliquis 5 mg submitted to [facility].

## 2016-02-05 NOTE — Telephone Encounter (Signed)
Left 4 bottles of samples up front of eliquis 5 mg, informed pt's wife & he is aware

## 2016-02-05 NOTE — Telephone Encounter (Signed)
New message  Patient wife calling the office for samples of medication:   1.  What medication and dosage are you requesting samples for? Eliquis,  Unknown of mg   2.  Are you currently out of this medication? No he still has some

## 2016-03-04 ENCOUNTER — Other Ambulatory Visit (HOSPITAL_COMMUNITY): Payer: No Typology Code available for payment source

## 2016-03-04 ENCOUNTER — Other Ambulatory Visit: Payer: No Typology Code available for payment source

## 2016-03-21 ENCOUNTER — Telehealth: Payer: Self-pay | Admitting: Cardiology

## 2016-03-21 NOTE — Telephone Encounter (Signed)
New message       Talk to the nurse.  She would only tell me that it is about his medications----no more info

## 2016-03-21 NOTE — Telephone Encounter (Signed)
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  Per the wife, the pt will schedule his echo for then, for the cost is too much to have this done now.  Also wife states that with the pt being in his probation period with his new job and insurance not kicking in until Oct, his Eliquis 5 mg will cost them over $600 bucks a month.  Wife is requesting samples for this pt to pick up.  Informed the wife that I will place the samples at the front desk, and I have a pt assistance form for Eliquis given by our prior auth Nurse, for the pt to fill out and return this back to be submitted.  Both verbalized understanding, agrees with this plan, and very gracious for all the assistance provided.

## 2016-03-25 ENCOUNTER — Ambulatory Visit: Payer: No Typology Code available for payment source | Admitting: Cardiology

## 2016-05-03 ENCOUNTER — Telehealth: Payer: Self-pay | Admitting: Cardiology

## 2016-05-03 NOTE — Telephone Encounter (Signed)
Spoke with the pt.  He had no further questions asked about Eliquis.  Pt will continue taking this regimen.

## 2016-05-03 NOTE — Telephone Encounter (Signed)
Pt called for some Samples of Eliquis please.   Give pt a call if any

## 2016-05-03 NOTE — Telephone Encounter (Signed)
called to let pt know that we will put eliquis 5mg  samples up front, pt aware

## 2016-07-29 ENCOUNTER — Telehealth: Payer: Self-pay | Admitting: Cardiology

## 2016-07-29 ENCOUNTER — Other Ambulatory Visit: Payer: Self-pay | Admitting: Cardiology

## 2016-07-29 NOTE — Telephone Encounter (Signed)
New Message  Patient calling the office for samples of medication:   1.  What medication and dosage are you requesting samples for? Eliquis 5mg    2.  Are you currently out of this medication? Per pt wife will be out soon.

## 2016-07-29 NOTE — Telephone Encounter (Signed)
Left a message for the pt and wife to call back.  

## 2016-07-29 NOTE — Telephone Encounter (Signed)
New Message   Pt wife call requesting to speak with RN about getting pt scheduled for a echo. Please call back to discuss

## 2016-07-29 NOTE — Telephone Encounter (Signed)
informed pt's wife to call back tomorrow for samples.

## 2016-07-30 ENCOUNTER — Telehealth: Payer: Self-pay | Admitting: Cardiology

## 2016-07-30 NOTE — Telephone Encounter (Signed)
WE DONT HAVE SAMPLES, INFORMED PT OF THIS.

## 2016-07-30 NOTE — Telephone Encounter (Signed)
Follow Up:   Thomasenia SalesLena said she was returning your call.

## 2016-07-30 NOTE — Telephone Encounter (Signed)
Patient calling the office for samples of medication:   1.  What medication and dosage are you requesting samples for? Eliquis  2.  Are you currently out of this medication? Just about 

## 2016-07-30 NOTE — Telephone Encounter (Signed)
Please refer to additional open encounter from operator for further documentation on call placed to our office.

## 2016-08-02 ENCOUNTER — Telehealth: Payer: Self-pay | Admitting: Cardiology

## 2016-08-02 NOTE — Telephone Encounter (Signed)
New Message ° °Patient calling the office for samples of medication: ° ° °1.  What medication and dosage are you requesting samples for? Eliquis 5mg  ° °2.  Are you currently out of this medication? Per pt wife will be out soon.  ° ° ° °

## 2016-08-02 NOTE — Telephone Encounter (Signed)
Patient aware one box of samples will be placed at the front desk along with another patient assistance packet.  I also provided a co-pay card as he says that he has never been given one.

## 2016-08-16 ENCOUNTER — Encounter (INDEPENDENT_AMBULATORY_CARE_PROVIDER_SITE_OTHER): Payer: Self-pay

## 2016-08-16 ENCOUNTER — Encounter: Payer: Self-pay | Admitting: Cardiology

## 2016-08-16 ENCOUNTER — Ambulatory Visit (INDEPENDENT_AMBULATORY_CARE_PROVIDER_SITE_OTHER): Payer: BLUE CROSS/BLUE SHIELD | Admitting: Cardiology

## 2016-08-16 VITALS — BP 122/84 | HR 59 | Ht 70.0 in | Wt 270.8 lb

## 2016-08-16 DIAGNOSIS — I5021 Acute systolic (congestive) heart failure: Secondary | ICD-10-CM

## 2016-08-16 DIAGNOSIS — I2510 Atherosclerotic heart disease of native coronary artery without angina pectoris: Secondary | ICD-10-CM | POA: Diagnosis not present

## 2016-08-16 DIAGNOSIS — I255 Ischemic cardiomyopathy: Secondary | ICD-10-CM | POA: Diagnosis not present

## 2016-08-16 DIAGNOSIS — R0600 Dyspnea, unspecified: Secondary | ICD-10-CM

## 2016-08-16 DIAGNOSIS — E785 Hyperlipidemia, unspecified: Secondary | ICD-10-CM | POA: Diagnosis not present

## 2016-08-16 DIAGNOSIS — R079 Chest pain, unspecified: Secondary | ICD-10-CM

## 2016-08-16 DIAGNOSIS — I2583 Coronary atherosclerosis due to lipid rich plaque: Secondary | ICD-10-CM

## 2016-08-16 NOTE — Patient Instructions (Signed)
Medication Instructions:  None  Labwork: CMET, CBC, Lipids today  Testing/Procedures: Your physician has requested that you have an echocardiogram. Echocardiography is a painless test that uses sound waves to create images of your heart. It provides your doctor with information about the size and shape of your heart and how well your heart's chambers and valves are working. This procedure takes approximately one hour. There are no restrictions for this procedure.  Your physician has requested that you have en exercise stress myoview. For further information please visit [email or web address]. Please follow instruction sheet, as given.    Follow-Up: Your physician wants you to follow-up in: 6 months with Dr. Delton SeeNelson.  You will receive a reminder letter in the mail two months in advance. If you don't receive a letter, please call our office to schedule the follow-up appointment.   Any Other Special Instructions Will Be Listed Below (If Applicable).     If you need a refill on your cardiac medications before your next appointment, please call your pharmacy.

## 2016-08-16 NOTE — Progress Notes (Signed)
08/16/2016 Ian [name]   [birth year]  [id number]  Primary Physician Ian MarchFINNEGAN, [name], MD Primary Cardiologist: Dr. Delton [name]   Reason for Visit/CC: F/u for CAD and PAF   HPI:  The patient is a 46 y/o moderately obese male, followed by Dr. Delton [name], with a h/o CAD, PAF, chronic anticoagulation, T2DM, HTN, HLD and tobacco abuse (smokes 2 ppd). He works as a Naval architecttruck driver. In 2016, he presented with a NSTEMI + atrial fibrillation w/ RVR. LHC demonstrated 90% RCA disease, treated with PCI + DES. There was mild residual 25% mid circumflex lesion. EF was 45-50%. He spontaneously converted to NSR but was placed on anticoagulation with Eliquis given a CHA2DS2 VASc score > 2. He was on tripple therapy with ASA, Plavix and Eliquis x 30 days. ASA was then discontinued. He has since been on Plavix + Eliquis. He has been doing well with this. No abnormal bleeding. He denies any symptoms of breakthrough atrial fibrillation. EKG today shows sinus bradycardia. HR is 59 bpm. He is on metoprolol and Cardizem. Also on lisinopril and Lasix. Volume is stable. He does note occasional exertional dyspnea, but relates this to smoking/ obesity. He denies LEE, orthopnea or PND.   He also notes occasional chest/ left shoulder pain, although unlike his angina associated with his MI. It hurts to reach overhead. Pain can last all day. His wife thinks he may have a rotator cuff injury. He is scheduled to see his PCP in 2 weeks. He is also in need of a yearly stress test as part of his annual DOT examination. He reports full medication compliance. No adverse side effects. BP is well controlled at 122/84.    Current Meds  Medication Sig  . apixaban (ELIQUIS) 5 MG TABS tablet Take 1 tablet (5 mg total) by mouth 2 (two) times daily.  Marland Kitchen. atorvastatin (LIPITOR) 80 MG tablet Take 1 tablet (80 mg total) by mouth daily at 6 PM.  . clopidogrel (PLAVIX) 75 MG tablet Take 1 tablet (75 mg total) by mouth daily.  Marland Kitchen. diltiazem (CARDIZEM CD) 120  MG 24 hr capsule Take 1 capsule (120 mg total) by mouth daily.  . furosemide (LASIX) 20 MG tablet Take 1 tablet (20 mg total) by mouth daily.  Marland Kitchen. glipiZIDE (GLUCOTROL) 5 MG tablet Take by mouth daily before breakfast.  . lisinopril (PRINIVIL,ZESTRIL) 2.5 MG tablet Take 1 tablet (2.5 mg total) by mouth daily.  . metFORMIN (GLUCOPHAGE) 500 MG tablet Take 1 tablet (500 mg total) by mouth 2 (two) times daily with a meal.  . metoprolol tartrate (LOPRESSOR) 25 MG tablet Take 0.5 tablets (12.5 mg total) by mouth 2 (two) times daily.  . Multiple Vitamin (MULTIVITAMIN) tablet Take 1 tablet by mouth daily.  . nitroGLYCERIN (NITROSTAT) 0.4 MG SL tablet Place 1 tablet (0.4 mg total) under the tongue every 5 (five) minutes x 3 doses as needed for chest pain.  . Omega-3 Fatty Acids (FISH OIL) 1200 MG CAPS Take by mouth as directed.  Marland Kitchen. OVER THE COUNTER MEDICATION diabazole-   No Known Allergies Past Medical History:  Diagnosis Date  . Arthritis    "left ankle" (05/22/2015)  . Complication of anesthesia    "hard to get me out of it sometimes; usually takes me longer to get under"  . Coronary artery disease   . History of palpitations   . HLD (hyperlipidemia)   . Hypertension   . PAF (paroxysmal atrial fibrillation) (HCC)   . Type II diabetes mellitus (HCC)    Family  History  Problem Relation Age of Onset  . Diabetes Mother   . Heart attack Father   . Diabetes Sister   . Diabetes Maternal Grandmother    Past Surgical History:  Procedure Laterality Date  . CARDIAC CATHETERIZATION  2000, 2005   "less than 20% blockage"  . CARDIAC CATHETERIZATION N/A 05/22/2015   Procedure: Left Heart Cath and Coronary Angiography;  Surgeon: Ian Crafts, MD;  Location: [facility] INVASIVE CV LAB;  Service: Cardiovascular;  Laterality: N/A;  . CARDIAC CATHETERIZATION N/A 05/22/2015   Procedure: Coronary Stent Intervention;  Surgeon: Ian Crafts, MD;  Location: [facility] INVASIVE CV LAB;  Service: Cardiovascular;   Laterality: N/A;  . CORONARY ANGIOPLASTY WITH STENT PLACEMENT  05/22/2015   "1 stent"  . MULTIPLE TOOTH EXTRACTIONS  2013   "all of them"   Social History   Social History  . Marital status: Married    Spouse name: N/A  . Number of children: N/A  . Years of education: N/A   Occupational History  . Not on file.   Social History Main Topics  . Smoking status: Current Every Day Smoker    Packs/day: 2.00    Years: 35.00    Types: Cigarettes  . Smokeless tobacco: Never Used  . Alcohol use 0.0 oz/week     Comment: 05/22/2015 "might have a few drinks/year"  . Drug use:     Types: Cocaine, Marijuana     Comment: "stopped in 1988"  . Sexual activity: Yes   Other Topics Concern  . 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     Review of Systems: General: negative for chills, fever, night sweats or weight changes.  Cardiovascular: negative for chest pain, dyspnea on exertion, edema, orthopnea, palpitations, paroxysmal nocturnal dyspnea or shortness of breath Dermatological: negative for rash Respiratory: negative for cough or wheezing Urologic: negative for hematuria Abdominal: negative for nausea, vomiting, diarrhea, bright red blood per rectum, melena, or hematemesis Neurologic: negative for visual changes, syncope, or dizziness All other systems reviewed and are otherwise negative except as noted above.   Physical Exam:  Blood pressure 122/84, pulse (!) 59, height 5\' 10"  (1.778 m), weight 270 lb 12.8 oz (122.8 kg).  General appearance: alert, cooperative, no distress and moderately obese Neck: no carotid bruit and no JVD Lungs: clear to auscultation bilaterally Heart: regular rate and rhythm, S1, S2 normal, no murmur, click, rub or gallop Extremities: extremities normal, atraumatic, no cyanosis or edema Pulses: 2+ and symmetric Skin: Skin color, texture, turgor normal. No rashes or lesions Neurologic: Grossly  normal  EKG sinus brady. 59 bpm. No ischemia.   ASSESSMENT AND PLAN:   1. CAD: s/p NSTEMI in 2016. Cath showed 90% RCA disease, treated with PCI + DES. There was mild residual 25% mid circumflex lesion. EF was 45-50%. Continue medical therapy and schedule exercise nuclear stress test to r/o ischemia given recent left chest/ shoulder pain.   2. PAF:  NSR on EKG. Rate is controlled on BB + CCB. On Eliquis for a/c. Check CBC today.   3. Cardiomyopathy/Systolic HF: EF 45-50% in 2016. Due for repeat echo (ordered by Dr. [name] at last OV but never completed). Pt advised to schedule study. Continue BB and ACE-I.   4. HLD: on statin therapy with Lipitor. He is fasting. Will check FLP and HFTs today.   5. HTN: well controlled on current regimen. Check CMP today to monitor renal function  and electrolytes.   6. DM: type 2. On Metformin. Followed by PCP.   7. Tobacco Abuse: smokes 2ppd. We discussed importance of smoking cessation. He had success in the past with patches. 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 He plans to further discuss treatment with his PCP.    PLAN  No med changes made today. F/u with D.r Ian See in 6 months.   Brittainy Simmons [name] 08/16/2016 9:10 AM

## 2016-08-17 LAB — CBC
HEMATOCRIT: 44.8 % (ref 37.5–51.0)
HEMOGLOBIN: 15.8 g/dL (ref 13.0–17.7)
MCH: 33.1 pg — AB (ref 26.6–33.0)
MCHC: 35.3 g/dL (ref 31.5–35.7)
MCV: 94 fL (ref 79–97)
Platelets: 172 10*3/uL (ref 150–379)
RBC: 4.77 x10E6/uL (ref 4.14–5.80)
RDW: 13.6 % (ref 12.3–15.4)
WBC: 10.7 10*3/uL (ref 3.4–10.8)

## 2016-08-17 LAB — COMPREHENSIVE METABOLIC PANEL
A/G RATIO: 1.7 (ref 1.2–2.2)
ALT: 26 IU/L (ref 0–44)
AST: 20 IU/L (ref 0–40)
Albumin: 4.3 g/dL (ref 3.5–5.5)
Alkaline Phosphatase: 94 IU/L (ref 39–117)
BUN/Creatinine Ratio: 15 (ref 9–20)
BUN: 16 mg/dL (ref 6–24)
Bilirubin Total: 0.6 mg/dL (ref 0.0–1.2)
CALCIUM: 9.8 mg/dL (ref 8.7–10.2)
CO2: 24 mmol/L (ref 18–29)
CREATININE: 1.05 mg/dL (ref 0.76–1.27)
Chloride: 97 mmol/L (ref 96–106)
GFR, EST AFRICAN AMERICAN: 97 mL/min/{1.73_m2} (ref >59)
GFR, EST NON AFRICAN AMERICAN: 84 mL/min/{1.73_m2} (ref >59)
Globulin, Total: 2.5 g/dL (ref 1.5–4.5)
Glucose: 117 mg/dL — ABNORMAL HIGH (ref 65–99)
POTASSIUM: 4.2 mmol/L (ref 3.5–5.2)
Sodium: 138 mmol/L (ref 134–144)
TOTAL PROTEIN: 6.8 g/dL (ref 6.0–8.5)

## 2016-08-17 LAB — LIPID PANEL
Chol/HDL Ratio: 5.3 ratio units — ABNORMAL HIGH (ref 0.0–5.0)
Cholesterol, Total: 138 mg/dL (ref 100–199)
HDL: 26 mg/dL — ABNORMAL LOW (ref >39)
LDL CALC: 54 mg/dL (ref 0–99)
Triglycerides: 288 mg/dL — ABNORMAL HIGH (ref 0–149)
VLDL CHOLESTEROL CAL: 58 mg/dL — AB (ref 5–40)

## 2016-08-27 ENCOUNTER — Telehealth (HOSPITAL_COMMUNITY): Payer: Self-pay | Admitting: *Deleted

## 2016-08-27 NOTE — Telephone Encounter (Signed)
Patient given detailed instructions per Myocardial Perfusion Study Information Sheet for the test on 08/30/16 at 0715. Patient notified to arrive 15 minutes early and that it is imperative to arrive on time for appointment to keep from having the test rescheduled.  If you need to cancel or reschedule your appointment, please call the office within 24 hours of your appointment. Failure to do so may result in a cancellation of your appointment, and a $50 no show fee. Patient verbalized understanding.Xyler Terpening, Adelene IdlerCynthia W

## 2016-08-29 ENCOUNTER — Telehealth (HOSPITAL_COMMUNITY): Payer: Self-pay | Admitting: Radiology

## 2016-08-29 NOTE — Telephone Encounter (Signed)
[facility] office on 2 hour delay  someone will call tomorrow afternoon to reschedule echocardiogram 

## 2016-08-30 ENCOUNTER — Ambulatory Visit (HOSPITAL_COMMUNITY): Payer: BLUE CROSS/BLUE SHIELD | Attending: Cardiovascular Disease

## 2016-08-30 ENCOUNTER — Other Ambulatory Visit: Payer: Self-pay

## 2016-08-30 ENCOUNTER — Other Ambulatory Visit (HOSPITAL_COMMUNITY): Payer: BLUE CROSS/BLUE SHIELD

## 2016-08-30 ENCOUNTER — Ambulatory Visit (HOSPITAL_BASED_OUTPATIENT_CLINIC_OR_DEPARTMENT_OTHER): Payer: BLUE CROSS/BLUE SHIELD

## 2016-08-30 ENCOUNTER — Ambulatory Visit (HOSPITAL_COMMUNITY): Payer: BLUE CROSS/BLUE SHIELD

## 2016-08-30 DIAGNOSIS — I480 Paroxysmal atrial fibrillation: Secondary | ICD-10-CM | POA: Insufficient documentation

## 2016-08-30 DIAGNOSIS — I2510 Atherosclerotic heart disease of native coronary artery without angina pectoris: Secondary | ICD-10-CM | POA: Insufficient documentation

## 2016-08-30 DIAGNOSIS — I10 Essential (primary) hypertension: Secondary | ICD-10-CM | POA: Diagnosis not present

## 2016-08-30 DIAGNOSIS — I2583 Coronary atherosclerosis due to lipid rich plaque: Secondary | ICD-10-CM

## 2016-08-30 DIAGNOSIS — I255 Ischemic cardiomyopathy: Secondary | ICD-10-CM

## 2016-08-30 DIAGNOSIS — R079 Chest pain, unspecified: Secondary | ICD-10-CM

## 2016-08-30 DIAGNOSIS — E119 Type 2 diabetes mellitus without complications: Secondary | ICD-10-CM | POA: Diagnosis not present

## 2016-08-30 DIAGNOSIS — R0600 Dyspnea, unspecified: Secondary | ICD-10-CM

## 2016-08-30 DIAGNOSIS — I252 Old myocardial infarction: Secondary | ICD-10-CM | POA: Insufficient documentation

## 2016-08-30 DIAGNOSIS — E785 Hyperlipidemia, unspecified: Secondary | ICD-10-CM | POA: Insufficient documentation

## 2016-08-30 DIAGNOSIS — E669 Obesity, unspecified: Secondary | ICD-10-CM | POA: Insufficient documentation

## 2016-08-30 LAB — ECHOCARDIOGRAM COMPLETE
CHL CUP DOP CALC LVOT VTI: 12.5 cm
FS: 31 % (ref 28–44)
Height: 70 in
IV/PV OW: 0.98
LA diam end sys: 44 mm
LA diam index: 1.86 cm/m2
LA vol index: 22.7 mL/m2
LASIZE: 44 mm
LAVOL: 53.8 mL
LAVOLA4C: 41.6 mL
LDCA: 4.91 cm2
LV PW d: 9.91 mm — AB (ref 0.6–1.1)
LVOT SV: 61 mL
LVOTD: 25 mm
LVOTPV: 76.1 cm/s
Lateral S' vel: 9.72 cm/s
WEIGHTICAEL: 4320 [oz_av]

## 2016-08-30 LAB — MYOCARDIAL PERFUSION IMAGING
CHL CUP NUCLEAR SRS: 2
CSEPPHR: 141 {beats}/min
RATE: 0.41
Rest HR: 108 {beats}/min
SDS: 0
SSS: 2
TID: 1.02

## 2016-08-30 MED ORDER — TECHNETIUM TC 99M TETROFOSMIN IV KIT
27.4000 | PACK | Freq: Once | INTRAVENOUS | Status: AC | PRN
  Administered 2016-08-30: 27.4 via INTRAVENOUS
  Filled 2016-08-30: qty 28

## 2016-08-30 MED ORDER — REGADENOSON 0.4 MG/5ML IV SOLN
0.4000 mg | Freq: Once | INTRAVENOUS | Status: AC
  Administered 2016-08-30: 0.4 mg via INTRAVENOUS

## 2016-08-30 MED ORDER — TECHNETIUM TC 99M TETROFOSMIN IV KIT
9.1000 | PACK | Freq: Once | INTRAVENOUS | Status: AC | PRN
  Administered 2016-08-30: 9.1 via INTRAVENOUS
  Filled 2016-08-30: qty 10

## 2016-08-31 ENCOUNTER — Telehealth: Payer: Self-pay | Admitting: Cardiology

## 2016-08-31 NOTE — Telephone Encounter (Signed)
Pt called asking whether he should hold any of his medications for a possible cardiac catheterization on Monday. States he was seen in the office on Friday for a stress test that was abnormal. Plans to see Dr. Delton SeeNelson on Monday. I advised to continue with current medications as I did not see any notes to hold medications, or see a cath planned.   Laverda PageLindsay Roberts NP-C

## 2016-09-02 ENCOUNTER — Encounter: Payer: Self-pay | Admitting: *Deleted

## 2016-09-02 ENCOUNTER — Encounter: Payer: Self-pay | Admitting: Cardiology

## 2016-09-02 ENCOUNTER — Ambulatory Visit (INDEPENDENT_AMBULATORY_CARE_PROVIDER_SITE_OTHER): Payer: BLUE CROSS/BLUE SHIELD | Admitting: Cardiology

## 2016-09-02 ENCOUNTER — Telehealth: Payer: Self-pay | Admitting: Cardiology

## 2016-09-02 VITALS — BP 136/80 | HR 64 | Ht 70.0 in | Wt 269.0 lb

## 2016-09-02 DIAGNOSIS — I480 Paroxysmal atrial fibrillation: Secondary | ICD-10-CM

## 2016-09-02 DIAGNOSIS — R9439 Abnormal result of other cardiovascular function study: Secondary | ICD-10-CM | POA: Diagnosis not present

## 2016-09-02 DIAGNOSIS — Z01812 Encounter for preprocedural laboratory examination: Secondary | ICD-10-CM

## 2016-09-02 DIAGNOSIS — I255 Ischemic cardiomyopathy: Secondary | ICD-10-CM

## 2016-09-02 DIAGNOSIS — E782 Mixed hyperlipidemia: Secondary | ICD-10-CM | POA: Diagnosis not present

## 2016-09-02 DIAGNOSIS — Z79899 Other long term (current) drug therapy: Secondary | ICD-10-CM

## 2016-09-02 MED ORDER — OMEGA-3-ACID ETHYL ESTERS 1 G PO CAPS: 1.0000 g | ORAL_CAPSULE | Freq: Every day | ORAL | 3 refills | Status: DC

## 2016-09-02 NOTE — Telephone Encounter (Signed)
I spoke with patient and he is on his way to the office to see Dr Delton SeeNelson this morning. It looks there was a scheduling error and pt was scheduled to see Dr Delton SeeNelson 10/02/16. I spoke with Dr Delton SeeNelson, she will add pt on to her schedule this morning for follow up of abnormal myoview.

## 2016-09-02 NOTE — Telephone Encounter (Signed)
Pt aware of Dr Lindaann SloughNelson's recommendation to continue Fish Oil.

## 2016-09-02 NOTE — Patient Instructions (Addendum)
Medication Instructions:  Please start Lovaza 1,000 mg a day. The current medical regimen is effective;  continue present plan and medications.  Labwork: Please have blood work today.  Testing/Procedures: Your physician has requested that you have a cardiac catheterization. Cardiac catheterization is used to diagnose and/or treat various heart conditions. Doctors may recommend this procedure for a number of different reasons. The most common reason is to evaluate chest pain. Chest pain can be a symptom of coronary artery disease (CAD), and cardiac catheterization can show whether plaque is narrowing or blocking your heart's arteries. This procedure is also used to evaluate the valves, as well as measure the blood flow and oxygen levels in different parts of your heart. For further information please visit [email or web address]. Please follow instruction sheet, as given.  Follow-Up: Follow up after your cardiac cath.  If you need a refill on your cardiac medications before your next appointment, please call your pharmacy.  Thank you for choosing [facility]!!

## 2016-09-02 NOTE — Telephone Encounter (Signed)
In that case he should continue taking fish oil. Thank you

## 2016-09-02 NOTE — Telephone Encounter (Signed)
Pt states he is unable to pay for Lovaza, it will be $160. Pt states he is currently taking Fish Oil 1200mg  daily and is asking if he can adjust this instead of taking Lovaza. Pt advised I will forward to Dr Delton SeeNelson for review.

## 2016-09-02 NOTE — Telephone Encounter (Signed)
New Message     They can not get the script you gave them, can they double up on the fish oil he already takes

## 2016-09-02 NOTE — Progress Notes (Signed)
 09/02/2016 Ian [name]   05/24/1971  [id number]  Primary Physician FINNEGAN, [name], MD Primary Cardiologist: Dr. Mohamad Bruso   Reason for Visit/CC: F/u for CAD and PAF   HPI:  The patient is a 45 y/o moderately obese male, followed by Dr. Bertin Inabinet, with a h/o CAD, PAF, chronic anticoagulation, T2DM, HTN, HLD and tobacco abuse (smokes 2 ppd). He works as a truck driver. In 2016, he presented with a NSTEMI + atrial fibrillation w/ RVR. LHC demonstrated 90% RCA disease, treated with PCI + DES. There was mild residual 25% mid circumflex lesion. EF was 45-50%. He spontaneously converted to NSR but was placed on anticoagulation with Eliquis given a CHA2DS2 VASc score > 2. He was on tripple therapy with ASA, Plavix and Eliquis x 30 days. ASA was then discontinued. He has since been on Plavix + Eliquis. He has been doing well with this. No abnormal bleeding. He denies any symptoms of breakthrough atrial fibrillation. EKG today shows sinus bradycardia. HR is 59 bpm. He is on metoprolol and Cardizem. Also on lisinopril and Lasix. Volume is stable. He does note occasional exertional dyspnea, but relates this to smoking/ obesity. He denies LEE, orthopnea or PND.   He also notes occasional chest/ left shoulder pain, although unlike his angina associated with his MI. It hurts to reach overhead. Pain can last all day. His wife thinks he may have a rotator cuff injury. He is scheduled to see his PCP in 2 weeks. He is also in need of a yearly stress test as part of his annual DOT examination. He reports full medication compliance. No adverse side effects. BP is well controlled at 122/84.   09/02/2016 the patient is coming for the results of his stress test that showed reversible defect in the inferior and apical walls consistent with ischemia in the RCA territory. He continues to have exertional and resting left shoulder pain and shortness of breath. He's been compliant with his meds. He continues to smoke. 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  Current Meds  Medication Sig  . apixaban (ELIQUIS) 5 MG TABS tablet Take 1 tablet (5 mg total) by mouth 2 (two) times daily.  . atorvastatin (LIPITOR) 80 MG tablet Take 1 tablet (80 mg total) by mouth daily at 6 PM.  . clopidogrel (PLAVIX) 75 MG tablet Take 1 tablet (75 mg total) by mouth daily.  . diltiazem (CARDIZEM CD) 120 MG 24 hr capsule Take 1 capsule (120 mg total) by mouth daily.  . furosemide (LASIX) 20 MG tablet Take 1 tablet (20 mg total) by mouth daily.  . glipiZIDE (GLUCOTROL) 5 MG tablet Take by mouth daily before breakfast.  . lisinopril (PRINIVIL,ZESTRIL) 2.5 MG tablet Take 1 tablet (2.5 mg total) by mouth daily.  . metFORMIN (GLUCOPHAGE) 500 MG tablet Take 1 tablet (500 mg total) by mouth 2 (two) times daily with a meal.  . metoprolol tartrate (LOPRESSOR) 25 MG tablet Take 0.5 tablets (12.5 mg total) by mouth 2 (two) times daily.  . Multiple Vitamin (MULTIVITAMIN) tablet Take 1 tablet by mouth daily.  . nitroGLYCERIN (NITROSTAT) 0.4 MG SL tablet Place 1 tablet (0.4 mg total) under the tongue every 5 (five) minutes x 3 doses as needed for chest pain.  . Omega-3 Fatty Acids (FISH OIL) 1200 MG CAPS Take by mouth as directed.  . OVER THE COUNTER MEDICATION diabazole-   No Known Allergies Past Medical History:  Diagnosis Date  .   Arthritis    "left ankle" (05/22/2015)  . Complication of anesthesia    "hard to get me out of it sometimes; usually takes me longer to get under"  . Coronary artery disease   . History of palpitations   . HLD (hyperlipidemia)   . Hypertension   . PAF (paroxysmal atrial fibrillation) (HCC)   . Type II diabetes mellitus (HCC)    Family History  Problem Relation Age of Onset  . Diabetes Mother   . Heart attack Father   . Diabetes Sister   . Diabetes Maternal Grandmother    Past Surgical History:  Procedure Laterality Date  . CARDIAC CATHETERIZATION   2000, 2005   "less than 20% blockage"  . CARDIAC CATHETERIZATION N/A 05/22/2015   Procedure: Left Heart Cath and Coronary Angiography;  Surgeon: [name], MD;  Location: [facility];  Service: Cardiovascular;  Laterality: N/A;  . CARDIAC CATHETERIZATION N/A 05/22/2015   Procedure: Coronary Stent Intervention;  Surgeon: [name], MD;  Location: [facility];  Service: Cardiovascular;  Laterality: N/A;  . CORONARY ANGIOPLASTY WITH STENT PLACEMENT  05/22/2015   "1 stent"  . MULTIPLE TOOTH EXTRACTIONS  2013   "all of them"   Social History   Social History  . Marital status: Married    Spouse name: N/A  . Number of children: N/A  . Years of education: N/A   Occupational History  . Not on file.   Social History Main Topics  . Smoking status: Current Every Day Smoker    Packs/day: 2.00    Years: 35.00    Types: Cigarettes  . Smokeless tobacco: Never Used  . Alcohol use 0.0 oz/week     Comment: 05/22/2015 "might have a few drinks/year"  . Drug use: Yes    Types: Cocaine, Marijuana     Comment: "stopped in 1988"  . Sexual activity: Yes   Other Topics Concern  . Not on file   Social History Narrative   Employed as a Truck Driver. Resides in [place] and [place].     Review of Systems: General: negative for chills, fever, night sweats or weight changes.  Cardiovascular: negative for chest pain, dyspnea on exertion, edema, orthopnea, palpitations, paroxysmal nocturnal dyspnea or shortness of breath Dermatological: negative for rash Respiratory: negative for cough or wheezing Urologic: negative for hematuria Abdominal: negative for nausea, vomiting, diarrhea, bright red blood per rectum, melena, or hematemesis Neurologic: negative for visual changes, syncope, or dizziness All other systems reviewed and are otherwise negative except as noted above.   Physical Exam:  Blood pressure 136/80, pulse 64, height 5' 10" (1.778 m), weight 269 lb (122  kg).  General appearance: alert, cooperative, no distress and moderately obese Neck: no carotid bruit and no JVD Lungs: clear to auscultation bilaterally Heart: regular rate and rhythm, S1, S2 normal, no murmur, click, rub or gallop Extremities: extremities normal, atraumatic, no cyanosis or edema Pulses: 2+ and symmetric Skin: Skin color, texture, turgor normal. No rashes or lesions Neurologic: Grossly normal  EKG sinus brady. 59 bpm. No ischemia.     ASSESSMENT AND PLAN:   1. CAD: s/p NSTEMI in 2016. Cath showed 90% RCA disease, treated with PCI + DES. There was mild residual 25% mid circumflex lesion. EF was 45-50%.  A pharmacologic nuclear stress test showed ischemia in the inferior and apical walls, we'll schedule left cardiac catheterization for Wednesday generator 24 2018 since he has already taken Elliquis today. He is advised to hold from   now on. And hold metformin and glipizide and furosemide daily procedure.  2. PAF:  NSR on EKG. Rate is controlled on BB + CCB. On Eliquis for a/c. Hemoglobin 15.8 on January 5.  3. Cardiomyopathy/Systolic HF: EF 45-50% in 2016. Repeat echocardiogram on 08/30/2016 showed slightly worse in LVEF 40-45% with diffuse hypokinesis. Continue BB and ACE-I.   4. HLD: on statin therapy with Lipitor. Normal LFTs on Jan , 2018, LDL 54,  HDL 26, TG 288, we will add lovaza 1 g to his regimen.   5. HTN: well controlled on current regimen. Check CMP today to monitor renal function and electrolytes.   6. DM: type 2. On Metformin. Followed by PCP.   7. Tobacco Abuse: smokes 2ppd. We discussed importance of smoking cessation. He had success in the past with patches. 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 He plans to further discuss treatment with his PCP.   PLAN: LHC this We 09/04/16, we will get labs CBC, CMP and INR today.   Oshua Mcconaha , MD 09/02/2016 10:18 AM 

## 2016-09-02 NOTE — Telephone Encounter (Signed)
Please advise him to take his meds with a sip of water early in the morning.

## 2016-09-03 ENCOUNTER — Telehealth: Payer: Self-pay | Admitting: Cardiology

## 2016-09-03 LAB — CBC
Hematocrit: 44.7 % (ref 37.5–51.0)
Hemoglobin: 15.5 g/dL (ref 13.0–17.7)
MCH: 33.3 pg — ABNORMAL HIGH (ref 26.6–33.0)
MCHC: 34.7 g/dL (ref 31.5–35.7)
MCV: 96 fL (ref 79–97)
Platelets: 162 10*3/uL (ref 150–379)
RBC: 4.65 x10E6/uL (ref 4.14–5.80)
RDW: 13 % (ref 12.3–15.4)
WBC: 8.6 10*3/uL (ref 3.4–10.8)

## 2016-09-03 LAB — BASIC METABOLIC PANEL
BUN/Creatinine Ratio: 20 (ref 9–20)
BUN: 16 mg/dL (ref 6–24)
CO2: 26 mmol/L (ref 18–29)
Calcium: 9.5 mg/dL (ref 8.7–10.2)
Chloride: 102 mmol/L (ref 96–106)
Creatinine, Ser: 0.82 mg/dL (ref 0.76–1.27)
GFR calc Af Amer: 122 mL/min/{1.73_m2} (ref >59)
GFR calc non Af Amer: 105 mL/min/{1.73_m2} (ref >59)
Glucose: 127 mg/dL — ABNORMAL HIGH (ref 65–99)
Potassium: 4.2 mmol/L (ref 3.5–5.2)
Sodium: 144 mmol/L (ref 134–144)

## 2016-09-03 NOTE — Telephone Encounter (Signed)
Pt states he is returning a call to [place], [place] returned call to  pt about lab done yesterday.

## 2016-09-03 NOTE — Telephone Encounter (Signed)
New Message ° °Pt voiced wanting to speak with nurse. ° ° °

## 2016-09-04 ENCOUNTER — Encounter (HOSPITAL_COMMUNITY): Payer: Self-pay | Admitting: Interventional Cardiology

## 2016-09-04 ENCOUNTER — Inpatient Hospital Stay (HOSPITAL_COMMUNITY)
Admission: AD | Admit: 2016-09-04 | Discharge: 2016-09-06 | DRG: 247 | Disposition: A | Discharge status: Home / Self Care | Payer: BLUE CROSS/BLUE SHIELD | Source: Ambulatory Visit | Attending: Interventional Cardiology | Admitting: Interventional Cardiology

## 2016-09-04 ENCOUNTER — Encounter (HOSPITAL_COMMUNITY)
Admission: AD | Disposition: A | Discharge status: Home / Self Care | Payer: Self-pay | Source: Ambulatory Visit | Attending: Interventional Cardiology

## 2016-09-04 ENCOUNTER — Other Ambulatory Visit: Payer: Self-pay

## 2016-09-04 DIAGNOSIS — Z7984 Long term (current) use of oral hypoglycemic drugs: Secondary | ICD-10-CM

## 2016-09-04 DIAGNOSIS — I252 Old myocardial infarction: Secondary | ICD-10-CM

## 2016-09-04 DIAGNOSIS — Z720 Tobacco use: Secondary | ICD-10-CM | POA: Diagnosis present

## 2016-09-04 DIAGNOSIS — I5020 Unspecified systolic (congestive) heart failure: Secondary | ICD-10-CM | POA: Diagnosis present

## 2016-09-04 DIAGNOSIS — Z8249 Family history of ischemic heart disease and other diseases of the circulatory system: Secondary | ICD-10-CM

## 2016-09-04 DIAGNOSIS — F17210 Nicotine dependence, cigarettes, uncomplicated: Secondary | ICD-10-CM | POA: Diagnosis present

## 2016-09-04 DIAGNOSIS — R9439 Abnormal result of other cardiovascular function study: Secondary | ICD-10-CM

## 2016-09-04 DIAGNOSIS — Z79899 Other long term (current) drug therapy: Secondary | ICD-10-CM

## 2016-09-04 DIAGNOSIS — I110 Hypertensive heart disease with heart failure: Secondary | ICD-10-CM | POA: Diagnosis present

## 2016-09-04 DIAGNOSIS — E669 Obesity, unspecified: Secondary | ICD-10-CM | POA: Diagnosis present

## 2016-09-04 DIAGNOSIS — T82855A Stenosis of coronary artery stent, initial encounter: Principal | ICD-10-CM | POA: Diagnosis present

## 2016-09-04 DIAGNOSIS — Z833 Family history of diabetes mellitus: Secondary | ICD-10-CM

## 2016-09-04 DIAGNOSIS — E119 Type 2 diabetes mellitus without complications: Secondary | ICD-10-CM | POA: Diagnosis present

## 2016-09-04 DIAGNOSIS — Y831 Surgical operation with implant of artificial internal device as the cause of abnormal reaction of the patient, or of later complication, without mention of misadventure at the time of the procedure: Secondary | ICD-10-CM | POA: Diagnosis present

## 2016-09-04 DIAGNOSIS — I480 Paroxysmal atrial fibrillation: Secondary | ICD-10-CM | POA: Diagnosis present

## 2016-09-04 DIAGNOSIS — I209 Angina pectoris, unspecified: Secondary | ICD-10-CM | POA: Diagnosis present

## 2016-09-04 DIAGNOSIS — E785 Hyperlipidemia, unspecified: Secondary | ICD-10-CM | POA: Diagnosis present

## 2016-09-04 DIAGNOSIS — I25118 Atherosclerotic heart disease of native coronary artery with other forms of angina pectoris: Secondary | ICD-10-CM | POA: Diagnosis not present

## 2016-09-04 DIAGNOSIS — E118 Type 2 diabetes mellitus with unspecified complications: Secondary | ICD-10-CM

## 2016-09-04 DIAGNOSIS — I10 Essential (primary) hypertension: Secondary | ICD-10-CM | POA: Diagnosis present

## 2016-09-04 DIAGNOSIS — Z7902 Long term (current) use of antithrombotics/antiplatelets: Secondary | ICD-10-CM

## 2016-09-04 DIAGNOSIS — Z7901 Long term (current) use of anticoagulants: Secondary | ICD-10-CM

## 2016-09-04 DIAGNOSIS — I255 Ischemic cardiomyopathy: Secondary | ICD-10-CM | POA: Diagnosis present

## 2016-09-04 DIAGNOSIS — Z9861 Coronary angioplasty status: Secondary | ICD-10-CM

## 2016-09-04 DIAGNOSIS — I25119 Atherosclerotic heart disease of native coronary artery with unspecified angina pectoris: Secondary | ICD-10-CM | POA: Diagnosis present

## 2016-09-04 HISTORY — PX: CARDIAC CATHETERIZATION: SHX172

## 2016-09-04 HISTORY — DX: Non-ST elevation (NSTEMI) myocardial infarction: I21.4

## 2016-09-04 LAB — GLUCOSE, CAPILLARY
GLUCOSE-CAPILLARY: 147 mg/dL — AB (ref 65–99)
GLUCOSE-CAPILLARY: 101 mg/dL — AB (ref 65–99)
Glucose-Capillary: 172 mg/dL — ABNORMAL HIGH (ref 65–99)
Glucose-Capillary: 92 mg/dL (ref 65–99)

## 2016-09-04 LAB — POCT ACTIVATED CLOTTING TIME
ACTIVATED CLOTTING TIME: 351 s
Activated Clotting Time: 351 seconds

## 2016-09-04 SURGERY — LEFT HEART CATH AND CORONARY ANGIOGRAPHY

## 2016-09-04 MED ORDER — HYDRALAZINE HCL 20 MG/ML IJ SOLN: 5.0000 mg | INTRAMUSCULAR | Status: AC | PRN

## 2016-09-04 MED ORDER — GLIPIZIDE 5 MG PO TABS
5.0000 mg | ORAL_TABLET | Freq: Every day | ORAL | Status: DC
  Administered 2016-09-06: 5 mg via ORAL
  Filled 2016-09-04: qty 1

## 2016-09-04 MED ORDER — SODIUM CHLORIDE 0.9% FLUSH: 3.0000 mL | INTRAVENOUS | Status: DC | PRN

## 2016-09-04 MED ORDER — HEPARIN SODIUM (PORCINE) 1000 UNIT/ML IJ SOLN
INTRAMUSCULAR | Status: DC | PRN
  Administered 2016-09-04: 6000 [IU] via INTRAVENOUS
  Administered 2016-09-04: 2000 [IU] via INTRAVENOUS
  Administered 2016-09-04: 6000 [IU] via INTRAVENOUS

## 2016-09-04 MED ORDER — HEPARIN SODIUM (PORCINE) 1000 UNIT/ML IJ SOLN
INTRAMUSCULAR | Status: AC
  Filled 2016-09-04: qty 1

## 2016-09-04 MED ORDER — ASPIRIN 81 MG PO CHEW
81.0000 mg | CHEWABLE_TABLET | ORAL | Status: AC
  Administered 2016-09-04: 81 mg via ORAL

## 2016-09-04 MED ORDER — CLOPIDOGREL BISULFATE 75 MG PO TABS
75.0000 mg | ORAL_TABLET | Freq: Every day | ORAL | Status: DC
  Filled 2016-09-04: qty 1

## 2016-09-04 MED ORDER — FENTANYL CITRATE (PF) 100 MCG/2ML IJ SOLN
INTRAMUSCULAR | Status: DC | PRN
  Administered 2016-09-04 (×2): 25 ug via INTRAVENOUS

## 2016-09-04 MED ORDER — ACETAMINOPHEN 325 MG PO TABS: 650.0000 mg | ORAL_TABLET | ORAL | Status: DC | PRN

## 2016-09-04 MED ORDER — ATORVASTATIN CALCIUM 80 MG PO TABS
80.0000 mg | ORAL_TABLET | Freq: Every day | ORAL | Status: DC
  Administered 2016-09-04 – 2016-09-05 (×2): 80 mg via ORAL
  Filled 2016-09-04 (×2): qty 1

## 2016-09-04 MED ORDER — ONDANSETRON HCL 4 MG/2ML IJ SOLN: 4.0000 mg | Freq: Four times a day (QID) | INTRAMUSCULAR | Status: DC | PRN

## 2016-09-04 MED ORDER — MIDAZOLAM HCL 2 MG/2ML IJ SOLN
INTRAMUSCULAR | Status: DC | PRN
  Administered 2016-09-04: 1 mg via INTRAVENOUS
  Administered 2016-09-04: 2 mg via INTRAVENOUS

## 2016-09-04 MED ORDER — MIDAZOLAM HCL 2 MG/2ML IJ SOLN
INTRAMUSCULAR | Status: AC
  Filled 2016-09-04: qty 2

## 2016-09-04 MED ORDER — LIDOCAINE HCL (PF) 1 % IJ SOLN
INTRAMUSCULAR | Status: DC | PRN
  Administered 2016-09-04: 2 mL
  Administered 2016-09-04: 5 mL via SUBCUTANEOUS

## 2016-09-04 MED ORDER — CLOPIDOGREL BISULFATE 75 MG PO TABS
75.0000 mg | ORAL_TABLET | Freq: Every day | ORAL | Status: DC
  Administered 2016-09-05 – 2016-09-06 (×2): 75 mg via ORAL
  Filled 2016-09-04: qty 1

## 2016-09-04 MED ORDER — ASPIRIN 81 MG PO CHEW
CHEWABLE_TABLET | ORAL | Status: AC
Start: 2016-09-04 — End: 2016-09-04
  Administered 2016-09-04: 81 mg via ORAL
  Filled 2016-09-04: qty 1

## 2016-09-04 MED ORDER — LABETALOL HCL 5 MG/ML IV SOLN: 10.0000 mg | INTRAVENOUS | Status: AC | PRN

## 2016-09-04 MED ORDER — FUROSEMIDE 20 MG PO TABS
20.0000 mg | ORAL_TABLET | Freq: Every day | ORAL | Status: DC
  Administered 2016-09-06: 20 mg via ORAL
  Filled 2016-09-04: qty 1

## 2016-09-04 MED ORDER — METOPROLOL TARTRATE 12.5 MG HALF TABLET
12.5000 mg | ORAL_TABLET | Freq: Two times a day (BID) | ORAL | Status: DC
  Administered 2016-09-04 – 2016-09-06 (×4): 12.5 mg via ORAL
  Filled 2016-09-04 (×4): qty 1

## 2016-09-04 MED ORDER — NITROGLYCERIN 0.4 MG SL SUBL: 0.4000 mg | SUBLINGUAL_TABLET | SUBLINGUAL | Status: DC | PRN

## 2016-09-04 MED ORDER — SODIUM CHLORIDE 0.9 % IV SOLN: 250.0000 mL | INTRAVENOUS | Status: DC | PRN

## 2016-09-04 MED ORDER — DILTIAZEM HCL ER COATED BEADS 120 MG PO CP24
120.0000 mg | ORAL_CAPSULE | Freq: Every day | ORAL | Status: DC
  Administered 2016-09-05 – 2016-09-06 (×2): 120 mg via ORAL
  Filled 2016-09-04 (×3): qty 1

## 2016-09-04 MED ORDER — SODIUM CHLORIDE 0.9 % IV SOLN: INTRAVENOUS | Status: AC

## 2016-09-04 MED ORDER — SODIUM CHLORIDE 0.9 % WEIGHT BASED INFUSION
3.0000 mL/kg/h | INTRAVENOUS | Status: DC
  Administered 2016-09-04: 3 mL/kg/h via INTRAVENOUS

## 2016-09-04 MED ORDER — VERAPAMIL HCL 2.5 MG/ML IV SOLN
INTRAVENOUS | Status: AC
  Filled 2016-09-04: qty 2

## 2016-09-04 MED ORDER — LISINOPRIL 5 MG PO TABS
2.5000 mg | ORAL_TABLET | Freq: Every day | ORAL | Status: DC
  Administered 2016-09-05 – 2016-09-06 (×2): 2.5 mg via ORAL
  Filled 2016-09-04 (×3): qty 1

## 2016-09-04 MED ORDER — VERAPAMIL HCL 2.5 MG/ML IV SOLN
INTRAVENOUS | Status: DC | PRN
  Administered 2016-09-04: 10 mL via INTRA_ARTERIAL

## 2016-09-04 MED ORDER — SODIUM CHLORIDE 0.9 % WEIGHT BASED INFUSION: 1.0000 mL/kg/h | INTRAVENOUS | Status: DC

## 2016-09-04 MED ORDER — HEPARIN (PORCINE) IN NACL 2-0.9 UNIT/ML-% IJ SOLN
INTRAMUSCULAR | Status: AC
  Filled 2016-09-04: qty 1000

## 2016-09-04 MED ORDER — FENTANYL CITRATE (PF) 100 MCG/2ML IJ SOLN
INTRAMUSCULAR | Status: AC
  Filled 2016-09-04: qty 2

## 2016-09-04 MED ORDER — CLOPIDOGREL BISULFATE 75 MG PO TABS
75.0000 mg | ORAL_TABLET | Freq: Once | ORAL | Status: AC
  Administered 2016-09-04: 75 mg via ORAL

## 2016-09-04 MED ORDER — CLOPIDOGREL BISULFATE 75 MG PO TABS
ORAL_TABLET | ORAL | Status: AC
  Administered 2016-09-04: 75 mg via ORAL
  Filled 2016-09-04: qty 1

## 2016-09-04 MED ORDER — SODIUM CHLORIDE 0.9% FLUSH: 3.0000 mL | Freq: Two times a day (BID) | INTRAVENOUS | Status: DC

## 2016-09-04 MED ORDER — HEPARIN (PORCINE) IN NACL 2-0.9 UNIT/ML-% IJ SOLN
INTRAMUSCULAR | Status: DC | PRN
  Administered 2016-09-04: 1000 mL

## 2016-09-04 MED ORDER — LIDOCAINE HCL (PF) 1 % IJ SOLN
INTRAMUSCULAR | Status: AC
  Filled 2016-09-04: qty 30

## 2016-09-04 MED ORDER — IOPAMIDOL (ISOVUE-370) INJECTION 76%
INTRAVENOUS | Status: DC | PRN
  Administered 2016-09-04: 100 mL via INTRA_ARTERIAL

## 2016-09-04 MED ORDER — ASPIRIN 81 MG PO CHEW
81.0000 mg | CHEWABLE_TABLET | Freq: Every day | ORAL | Status: DC
  Administered 2016-09-05 – 2016-09-06 (×2): 81 mg via ORAL
  Filled 2016-09-04 (×2): qty 1

## 2016-09-04 MED ORDER — IOPAMIDOL (ISOVUE-370) INJECTION 76%
INTRAVENOUS | Status: AC
  Filled 2016-09-04: qty 100

## 2016-09-04 MED ORDER — SODIUM CHLORIDE 0.9% FLUSH
3.0000 mL | Freq: Two times a day (BID) | INTRAVENOUS | Status: DC
  Administered 2016-09-04 – 2016-09-05 (×3): 3 mL via INTRAVENOUS

## 2016-09-04 SURGICAL SUPPLY — 24 items
BALLN ANGIOSCULPT RX 3.5X10 (BALLOONS) ×2
BALLN EUPHORA RX 2.0X15 (BALLOONS) ×2
BALLN EUPHORA RX 3.0X15 (BALLOONS) ×2
BALLN EUPHORA RX 3.5X12 (BALLOONS) ×2
BALLN MOZEC 1.5X15 (BALLOONS) ×2
BALLOON ANGIOSCULPT RX 3.5X10 (BALLOONS) ×1 IMPLANT
BALLOON EUPHORA RX 2.0X15 (BALLOONS) ×1 IMPLANT
BALLOON EUPHORA RX 3.0X15 (BALLOONS) ×1 IMPLANT
BALLOON EUPHORA RX 3.5X12 (BALLOONS) ×1 IMPLANT
BALLOON MOZEC 1.5X15 (BALLOONS) ×1 IMPLANT
CATH 5FR JL3.5 JR4 ANG PIG MP (CATHETERS) ×2 IMPLANT
DEVICE RAD COMP TR BAND LRG (VASCULAR PRODUCTS) ×2 IMPLANT
GLIDESHEATH SLEND SS 6F .021 (SHEATH) ×2 IMPLANT
GUIDE CATH RUNWAY 6FR FR4 (CATHETERS) ×2 IMPLANT
GUIDEWIRE INQWIRE 1.5J.035X260 (WIRE) ×1 IMPLANT
INQWIRE 1.5J .035X260CM (WIRE) ×2
KIT ENCORE 26 ADVANTAGE (KITS) ×2 IMPLANT
KIT HEART LEFT (KITS) ×2 IMPLANT
PACK CARDIAC CATHETERIZATION (CUSTOM PROCEDURE TRAY) ×2 IMPLANT
TRANSDUCER W/STOPCOCK (MISCELLANEOUS) ×2 IMPLANT
TUBING CIL FLEX 10 FLL-RA (TUBING) ×2 IMPLANT
VALVE GUARDIAN II ~~LOC~~ HEMO (MISCELLANEOUS) ×2 IMPLANT
WIRE ASAHI PROWATER 180CM (WIRE) ×2 IMPLANT
WIRE HI TORQ BMW 190CM (WIRE) ×2 IMPLANT

## 2016-09-04 NOTE — Interval H&P Note (Signed)
Cath Lab Visit (complete for each Cath Lab visit)  Clinical Evaluation Leading to the Procedure:   ACS: No.  Non-ACS:    Anginal Classification: CCS II  Anti-ischemic medical therapy: Maximal Therapy (2 or more classes of medications)  Non-Invasive Test Results: Intermediate-risk stress test findings: cardiac mortality 1-3%/year  Prior CABG: No previous CABG      History and Physical Interval Note:  09/04/2016 11:14 AM  Ian [name]  has presented today for surgery, with the diagnosis of abnormal stress test  The various methods of treatment have been discussed with the patient and family. After consideration of risks, benefits and other options for treatment, the patient has consented to  Procedure(s): Left Heart Cath and Coronary Angiography (N/A) as a surgical intervention .  The patient's history has been reviewed, patient examined, no change in status, stable for surgery.  I have reviewed the patient's chart and labs.  Questions were answered to the patient's satisfaction.     Lance MussJayadeep Maeson Purohit

## 2016-09-04 NOTE — H&P (View-Only) (Signed)
09/02/2016 Ian [name]   [birth year]  [id number]  Primary Physician [name], MD Primary Cardiologist: Dr. [name]   Reason for Visit/CC: F/u for CAD and PAF   HPI:  The patient is a 46 y/o moderately obese male, followed by Dr. [name], with a h/o CAD, PAF, chronic anticoagulation, T2DM, HTN, HLD and tobacco abuse (smokes 2 ppd). He works as a Naval architect. In 2016, he presented with a NSTEMI + atrial fibrillation w/ RVR. LHC demonstrated 90% RCA disease, treated with PCI + DES. There was mild residual 25% mid circumflex lesion. EF was 45-50%. He spontaneously converted to NSR but was placed on anticoagulation with Eliquis given a CHA2DS2 VASc score > 2. He was on tripple therapy with ASA, Plavix and Eliquis x 30 days. ASA was then discontinued. He has since been on Plavix + Eliquis. He has been doing well with this. No abnormal bleeding. He denies any symptoms of breakthrough atrial fibrillation. EKG today shows sinus bradycardia. HR is 59 bpm. He is on metoprolol and Cardizem. Also on lisinopril and Lasix. Volume is stable. He does note occasional exertional dyspnea, but relates this to smoking/ obesity. He denies LEE, orthopnea or PND.   He also notes occasional chest/ left shoulder pain, although unlike his angina associated with his MI. It hurts to reach overhead. Pain can last all day. His wife thinks he may have a rotator cuff injury. He is scheduled to see his PCP in 2 weeks. He is also in need of a yearly stress test as part of his annual DOT examination. He reports full medication compliance. No adverse side effects. BP is well controlled at 122/84.   09/02/2016 the patient is coming for the results of his stress test that showed reversible defect in the inferior and apical walls consistent with ischemia in the RCA territory. He continues to have exertional and resting left shoulder pain and shortness of breath. He's been compliant with his meds. He continues to smoke. 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  Current Meds  Medication Sig  . apixaban (ELIQUIS) 5 MG TABS tablet Take 1 tablet (5 mg total) by mouth 2 (two) times daily.  Marland Kitchen atorvastatin (LIPITOR) 80 MG tablet Take 1 tablet (80 mg total) by mouth daily at 6 PM.  . clopidogrel (PLAVIX) 75 MG tablet Take 1 tablet (75 mg total) by mouth daily.  Marland Kitchen diltiazem (CARDIZEM CD) 120 MG 24 hr capsule Take 1 capsule (120 mg total) by mouth daily.  . furosemide (LASIX) 20 MG tablet Take 1 tablet (20 mg total) by mouth daily.  Marland Kitchen glipiZIDE (GLUCOTROL) 5 MG tablet Take by mouth daily before breakfast.  . lisinopril (PRINIVIL,ZESTRIL) 2.5 MG tablet Take 1 tablet (2.5 mg total) by mouth daily.  . metFORMIN (GLUCOPHAGE) 500 MG tablet Take 1 tablet (500 mg total) by mouth 2 (two) times daily with a meal.  . metoprolol tartrate (LOPRESSOR) 25 MG tablet Take 0.5 tablets (12.5 mg total) by mouth 2 (two) times daily.  . Multiple Vitamin (MULTIVITAMIN) tablet Take 1 tablet by mouth daily.  . nitroGLYCERIN (NITROSTAT) 0.4 MG SL tablet Place 1 tablet (0.4 mg total) under the tongue every 5 (five) minutes x 3 doses as needed for chest pain.  . Omega-3 Fatty Acids (FISH OIL) 1200 MG CAPS Take by mouth as directed.  Marland Kitchen OVER THE COUNTER MEDICATION diabazole-   No Known Allergies Past Medical History:  Diagnosis Date  .  Arthritis    "left ankle" (05/22/2015)  . Complication of anesthesia    "hard to get me out of it sometimes; usually takes me longer to get under"  . Coronary artery disease   . History of palpitations   . HLD (hyperlipidemia)   . Hypertension   . PAF (paroxysmal atrial fibrillation) (HCC)   . Type II diabetes mellitus (HCC)    Family History  Problem Relation Age of Onset  . Diabetes Mother   . Heart attack Father   . Diabetes Sister   . Diabetes Maternal Grandmother    Past Surgical History:  Procedure Laterality Date  . CARDIAC CATHETERIZATION   2000, 2005   "less than 20% blockage"  . CARDIAC CATHETERIZATION N/A 05/22/2015   Procedure: Left Heart Cath and Coronary Angiography;  Surgeon: [name], MD;  [facility];  Service: Cardiovascular;  Laterality: N/A;  . CARDIAC CATHETERIZATION N/A 05/22/2015   Procedure: Coronary Stent Intervention;  Surgeon: [name], MD;  [facility];  Service: Cardiovascular;  Laterality: N/A;  . CORONARY ANGIOPLASTY WITH STENT PLACEMENT  05/22/2015   "1 stent"  . MULTIPLE TOOTH EXTRACTIONS  2013   "all of them"   Social History   Social History  . Marital status: Married    Spouse name: N/A  . Number of children: N/A  . Years of education: N/A   Occupational History  . Not on file.   Social History Main Topics  . Smoking status: Current Every Day Smoker    Packs/day: 2.00    Years: 35.00    Types: Cigarettes  . Smokeless tobacco: Never Used  . Alcohol use 0.0 oz/week     Comment: 05/22/2015 "might have a few drinks/year"  . Drug use: Yes    Types: Cocaine, Marijuana     Comment: "stopped in 1988"  . Sexual activity: Yes   Other Topics Concern  . 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     Review of Systems: General: negative for chills, fever, night sweats or weight changes.  Cardiovascular: negative for chest pain, dyspnea on exertion, edema, orthopnea, palpitations, paroxysmal nocturnal dyspnea or shortness of breath Dermatological: negative for rash Respiratory: negative for cough or wheezing Urologic: negative for hematuria Abdominal: negative for nausea, vomiting, diarrhea, bright red blood per rectum, melena, or hematemesis Neurologic: negative for visual changes, syncope, or dizziness All other systems reviewed and are otherwise negative except as noted above.   Physical Exam:  Blood pressure 136/80, pulse 64, height 5\' 10"  (1.778 m), weight 269 lb (122  kg).  General appearance: alert, cooperative, no distress and moderately obese Neck: no carotid bruit and no JVD Lungs: clear to auscultation bilaterally Heart: regular rate and rhythm, S1, S2 normal, no murmur, click, rub or gallop Extremities: extremities normal, atraumatic, no cyanosis or edema Pulses: 2+ and symmetric Skin: Skin color, texture, turgor normal. No rashes or lesions Neurologic: Grossly normal  EKG sinus brady. 59 bpm. No ischemia.     ASSESSMENT AND PLAN:   1. CAD: s/p NSTEMI in 2016. Cath showed 90% RCA disease, treated with PCI + DES. There was mild residual 25% mid circumflex lesion. EF was 45-50%.  A pharmacologic nuclear stress test showed ischemia in the inferior and apical walls, we'll schedule left cardiac catheterization for Wednesday generator 24 2018 since he has already taken Elliquis today. He is advised to hold from  now on. And hold metformin and glipizide and furosemide daily procedure.  2. PAF:  NSR on EKG. Rate is controlled on BB + CCB. On Eliquis for a/c. Hemoglobin 15.8 on January 5.  3. Cardiomyopathy/Systolic HF: EF 45-50% in 2016. Repeat echocardiogram on 08/30/2016 showed slightly worse in LVEF 40-45% with diffuse hypokinesis. Continue BB and ACE-I.   4. HLD: on statin therapy with Lipitor. Normal LFTs on Jan , 2018, LDL 54,  HDL 26, TG 288, we will add lovaza 1 g to his regimen.   5. HTN: well controlled on current regimen. Check CMP today to monitor renal function and electrolytes.   6. DM: type 2. On Metformin. Followed by PCP.   7. Tobacco Abuse: smokes 2ppd. We discussed importance of smoking cessation. He had success in the past with patches. 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 He plans to further discuss treatment with his PCP.   PLAN: LHC this We 09/04/16, we will get labs CBC, CMP and INR today.   Tobias Alexander , MD 09/02/2016 10:18 AM

## 2016-09-04 NOTE — Progress Notes (Signed)
TR BAND REMOVAL  LOCATION:    right radial  DEFLATED PER PROTOCOL:    Yes.    TIME BAND OFF / DRESSING APPLIED:    1740   SITE UPON ARRIVAL:    Level 0  SITE AFTER BAND REMOVAL:    Level 0  CIRCULATION SENSATION AND MOVEMENT:    Within Normal Limits   Yes.    COMMENTS:

## 2016-09-04 NOTE — Care Management Note (Signed)
Case Management Note  Patient Details  Name: Ian [name] MRN: [id number] Date of Birth: [birth year]  Subjective/Objective:   S/p  Coronary balloon angioplasty, will be on plavix/asa, NCM will cont to follow for dc needs.                 Action/Plan:   Expected Discharge Date:                  Expected Discharge Plan:  Home/Self Care  In-House Referral:     Discharge planning Services  CM Consult  Post Acute Care Choice:    Choice offered to:     DME Arranged:    DME Agency:     HH Arranged:    HH Agency:     Status of Service:  In process, will continue to follow  If discussed at Long Length of Stay Meetings, dates discussed:    Additional Comments:  Leone Havenaylor, Jann Ra Clinton, RN 09/04/2016, 4:59 PM

## 2016-09-05 ENCOUNTER — Encounter (HOSPITAL_COMMUNITY)
Admission: AD | Disposition: A | Discharge status: Home / Self Care | Payer: Self-pay | Source: Ambulatory Visit | Attending: Interventional Cardiology

## 2016-09-05 ENCOUNTER — Other Ambulatory Visit: Payer: Self-pay

## 2016-09-05 DIAGNOSIS — E119 Type 2 diabetes mellitus without complications: Secondary | ICD-10-CM

## 2016-09-05 DIAGNOSIS — I209 Angina pectoris, unspecified: Secondary | ICD-10-CM

## 2016-09-05 DIAGNOSIS — I25118 Atherosclerotic heart disease of native coronary artery with other forms of angina pectoris: Secondary | ICD-10-CM | POA: Diagnosis not present

## 2016-09-05 DIAGNOSIS — I25119 Atherosclerotic heart disease of native coronary artery with unspecified angina pectoris: Secondary | ICD-10-CM | POA: Diagnosis present

## 2016-09-05 DIAGNOSIS — Z9861 Coronary angioplasty status: Secondary | ICD-10-CM | POA: Insufficient documentation

## 2016-09-05 HISTORY — PX: CARDIAC CATHETERIZATION: SHX172

## 2016-09-05 LAB — CBC
HCT: 44 % (ref 39.0–52.0)
Hemoglobin: 15 g/dL (ref 13.0–17.0)
MCH: 33.1 pg (ref 26.0–34.0)
MCHC: 34.1 g/dL (ref 30.0–36.0)
MCV: 97.1 fL (ref 78.0–100.0)
PLATELETS: 135 10*3/uL — AB (ref 150–400)
RBC: 4.53 MIL/uL (ref 4.22–5.81)
RDW: 12.8 % (ref 11.5–15.5)
WBC: 8.4 10*3/uL (ref 4.0–10.5)

## 2016-09-05 LAB — BASIC METABOLIC PANEL
Anion gap: 8 (ref 5–15)
BUN: 16 mg/dL (ref 6–20)
CALCIUM: 9.9 mg/dL (ref 8.9–10.3)
CO2: 24 mmol/L (ref 22–32)
CREATININE: 0.88 mg/dL (ref 0.61–1.24)
Chloride: 107 mmol/L (ref 101–111)
GFR calc non Af Amer: >60 mL/min (ref >60)
Glucose, Bld: 129 mg/dL — ABNORMAL HIGH (ref 65–99)
Potassium: 4.4 mmol/L (ref 3.5–5.1)
Sodium: 139 mmol/L (ref 135–145)

## 2016-09-05 LAB — POCT ACTIVATED CLOTTING TIME
ACTIVATED CLOTTING TIME: 368 s
ACTIVATED CLOTTING TIME: 252 s
ACTIVATED CLOTTING TIME: 131 s

## 2016-09-05 LAB — GLUCOSE, CAPILLARY
GLUCOSE-CAPILLARY: 139 mg/dL — AB (ref 65–99)
GLUCOSE-CAPILLARY: 87 mg/dL (ref 65–99)
GLUCOSE-CAPILLARY: 147 mg/dL — AB (ref 65–99)
Glucose-Capillary: 119 mg/dL — ABNORMAL HIGH (ref 65–99)

## 2016-09-05 SURGERY — CORONARY ATHERECTOMY

## 2016-09-05 MED ORDER — CLOPIDOGREL BISULFATE 75 MG PO TABS: 75.0000 mg | ORAL_TABLET | Freq: Every day | ORAL | Status: AC

## 2016-09-05 MED ORDER — SODIUM CHLORIDE 0.9 % IV SOLN: INTRAVENOUS | Status: AC

## 2016-09-05 MED ORDER — MIDAZOLAM HCL 2 MG/2ML IJ SOLN
INTRAMUSCULAR | Status: AC
  Filled 2016-09-05: qty 2

## 2016-09-05 MED ORDER — HEPARIN SODIUM (PORCINE) 1000 UNIT/ML IJ SOLN
INTRAMUSCULAR | Status: AC
  Filled 2016-09-05: qty 1

## 2016-09-05 MED ORDER — HEPARIN (PORCINE) IN NACL 2-0.9 UNIT/ML-% IJ SOLN
INTRAMUSCULAR | Status: AC
  Filled 2016-09-05: qty 1500

## 2016-09-05 MED ORDER — HEPARIN (PORCINE) IN NACL 2-0.9 UNIT/ML-% IJ SOLN
INTRAMUSCULAR | Status: DC | PRN
  Administered 2016-09-05: 1000 mL

## 2016-09-05 MED ORDER — IOPAMIDOL (ISOVUE-370) INJECTION 76%
INTRAVENOUS | Status: AC
  Filled 2016-09-05: qty 50

## 2016-09-05 MED ORDER — VIPERSLIDE LUBRICANT OPTIME
TOPICAL | Status: DC | PRN
  Administered 2016-09-05: 15:00:00 via SURGICAL_CAVITY

## 2016-09-05 MED ORDER — SODIUM CHLORIDE 0.9% FLUSH: 3.0000 mL | INTRAVENOUS | Status: DC | PRN

## 2016-09-05 MED ORDER — LABETALOL HCL 5 MG/ML IV SOLN: 10.0000 mg | INTRAVENOUS | Status: AC | PRN

## 2016-09-05 MED ORDER — SODIUM CHLORIDE 0.9% FLUSH
3.0000 mL | Freq: Two times a day (BID) | INTRAVENOUS | Status: DC
  Administered 2016-09-05: 3 mL via INTRAVENOUS

## 2016-09-05 MED ORDER — SODIUM CHLORIDE 0.9 % IV SOLN: 250.0000 mL | INTRAVENOUS | Status: DC | PRN

## 2016-09-05 MED ORDER — FENTANYL CITRATE (PF) 100 MCG/2ML IJ SOLN
INTRAMUSCULAR | Status: AC
  Filled 2016-09-05: qty 2

## 2016-09-05 MED ORDER — IOPAMIDOL (ISOVUE-370) INJECTION 76%
INTRAVENOUS | Status: DC | PRN
  Administered 2016-09-05: 150 mL via INTRA_ARTERIAL

## 2016-09-05 MED ORDER — LIDOCAINE HCL (PF) 1 % IJ SOLN
INTRAMUSCULAR | Status: DC | PRN
  Administered 2016-09-05: 12 mL via INTRADERMAL

## 2016-09-05 MED ORDER — SODIUM CHLORIDE 0.9 % IV SOLN
INTRAVENOUS | Status: DC
  Administered 2016-09-05: 09:00:00 via INTRAVENOUS

## 2016-09-05 MED ORDER — NITROGLYCERIN 1 MG/10 ML FOR IR/CATH LAB
INTRA_ARTERIAL | Status: AC
  Filled 2016-09-05: qty 10

## 2016-09-05 MED ORDER — ONDANSETRON HCL 4 MG/2ML IJ SOLN: 4.0000 mg | Freq: Four times a day (QID) | INTRAMUSCULAR | Status: DC | PRN

## 2016-09-05 MED ORDER — SODIUM CHLORIDE 0.9% FLUSH
3.0000 mL | Freq: Two times a day (BID) | INTRAVENOUS | Status: DC
  Administered 2016-09-05: 23:00:00 3 mL via INTRAVENOUS

## 2016-09-05 MED ORDER — HYDRALAZINE HCL 20 MG/ML IJ SOLN: 5.0000 mg | INTRAMUSCULAR | Status: AC | PRN

## 2016-09-05 MED ORDER — FENTANYL CITRATE (PF) 100 MCG/2ML IJ SOLN
INTRAMUSCULAR | Status: DC | PRN
  Administered 2016-09-05: 50 ug via INTRAVENOUS
  Administered 2016-09-05: 25 ug via INTRAVENOUS

## 2016-09-05 MED ORDER — IOPAMIDOL (ISOVUE-370) INJECTION 76%
INTRAVENOUS | Status: AC
  Filled 2016-09-05: qty 125

## 2016-09-05 MED ORDER — HEPARIN SODIUM (PORCINE) 1000 UNIT/ML IJ SOLN
INTRAMUSCULAR | Status: DC | PRN
  Administered 2016-09-05: 10000 [IU] via INTRAVENOUS

## 2016-09-05 MED ORDER — ASPIRIN 81 MG PO CHEW: 81.0000 mg | CHEWABLE_TABLET | ORAL | Status: DC

## 2016-09-05 MED ORDER — LIDOCAINE HCL (PF) 1 % IJ SOLN
INTRAMUSCULAR | Status: AC
  Filled 2016-09-05: qty 30

## 2016-09-05 MED ORDER — MIDAZOLAM HCL 2 MG/2ML IJ SOLN
INTRAMUSCULAR | Status: DC | PRN
  Administered 2016-09-05: 1 mg via INTRAVENOUS
  Administered 2016-09-05: 2 mg via INTRAVENOUS

## 2016-09-05 SURGICAL SUPPLY — 20 items
BALLN MOZEC 3.0X14 (BALLOONS) ×2
BALLN ~~LOC~~ MOZEC 4.0X8 (BALLOONS) ×4
BALLOON MOZEC 3.0X14 (BALLOONS) ×1 IMPLANT
BALLOON ~~LOC~~ MOZEC 4.0X8 (BALLOONS) ×2 IMPLANT
CATH S G BIP PACING (SET/KITS/TRAYS/PACK) ×2 IMPLANT
CROWN DIAMONDBACK CLASSIC 1.25 (BURR) ×2 IMPLANT
DEVICE CONTINUOUS FLUSH (MISCELLANEOUS) ×2 IMPLANT
GUIDE CATH MACH 1 7F AL.75 (CATHETERS) ×2 IMPLANT
KIT ENCORE 26 ADVANTAGE (KITS) ×4 IMPLANT
KIT HEART LEFT (KITS) ×2 IMPLANT
LUBRICANT VIPERSLIDE CORONARY (MISCELLANEOUS) ×2 IMPLANT
PACK CARDIAC CATHETERIZATION (CUSTOM PROCEDURE TRAY) ×2 IMPLANT
SHEATH PINNACLE 6F 10CM (SHEATH) ×2 IMPLANT
SHEATH PINNACLE 7F 10CM (SHEATH) ×2 IMPLANT
STENT SYNERGY DES 3.5X32 (Permanent Stent) ×2 IMPLANT
TRANSDUCER W/STOPCOCK (MISCELLANEOUS) ×2 IMPLANT
TUBING CIL FLEX 10 FLL-RA (TUBING) ×2 IMPLANT
WIRE ASAHI PROWATER 180CM (WIRE) ×2 IMPLANT
WIRE EMERALD 3MM-J .035X150CM (WIRE) ×2 IMPLANT
WIRE VIPER ADVANCE COR .012TIP (WIRE) ×4 IMPLANT

## 2016-09-05 NOTE — Interval H&P Note (Signed)
History and Physical Interval Note:  09/05/2016 12:45 PM  Ian [name]  has presented today for surgery, with the diagnosis of cad  The various methods of treatment have been discussed with the patient and family. After consideration of risks, benefits and other options for treatment, the patient has consented to  Procedure(s): Coronary Atherectomy (N/A) and Stenting as a surgical intervention .  The patient's history has been reviewed, patient examined, no change in status, stable for surgery.  I have reviewed the patient's chart and labs.  Questions were answered to the patient's satisfaction.     Bryan Lemmaavid Efrain Clauson

## 2016-09-05 NOTE — Progress Notes (Signed)
Site area: right groin  Site Prior to Removal:  Level 0  Pressure Applied For 20 MINUTES    Minutes Beginning at 1800  Manual:   Yes.    Patient Status During Pull:  Stable   Post Pull Groin Site:  Level 0  Post Pull Instructions Given:  Yes.    Post Pull Pulses Present:  Yes.    Dressing Applied:  Yes.    Comments:  Patient called at 1910 to report he sneezed and held pressure at site during that time but if felt firm under dressing. Dressing dry and intact but noted firmness under dressing where it was soft after sheath pull. Pressure held at site 1910-1920 for 10 minutes and site was soft, no bruise noted and no hematoma or bleeding at 1920. Instructed patient to call again for any problems and thanked him for reporting firmness noted. Reported to [facility] RN

## 2016-09-05 NOTE — Progress Notes (Signed)
Progress Note  Patient Name: Ian [name] Date of Encounter: 09/05/2016  Primary Cardiologist: Tobias Alexander MD  Subjective   Feels well. No chest pain. No dyspnea.   Inpatient Medications    Scheduled Meds: . aspirin  81 mg Oral Daily  . atorvastatin  80 mg Oral q1800  . clopidogrel  75 mg Oral Daily  . clopidogrel  75 mg Oral Q breakfast  . diltiazem  120 mg Oral Daily  . furosemide  20 mg Oral Daily  . glipiZIDE  5 mg Oral QAC breakfast  . lisinopril  2.5 mg Oral Daily  . metoprolol tartrate  12.5 mg Oral BID  . sodium chloride flush  3 mL Intravenous Q12H   Continuous Infusions:  PRN Meds: sodium chloride, acetaminophen, nitroGLYCERIN, ondansetron (ZOFRAN) IV, sodium chloride flush   Vital Signs    Vitals:   09/04/16 1415 09/04/16 1600 09/04/16 2026 09/05/16 0655  BP: 106/65 (!) 107/52 113/63 117/61  Pulse:  66 70 (!) 58  Resp: 18 20 15 12   Temp:  97.6 F (36.4 C) 97.8 F (36.6 C) 98.1 F (36.7 C)  TempSrc:  Oral Oral Oral  SpO2: 96% 96% 96% 96%  Weight:    271 lb 2.7 oz (123 kg)  Height:        Intake/Output Summary (Last 24 hours) at 09/05/16 0735 Last data filed at 09/05/16 0659  Gross per 24 hour  Intake              840 ml  Output             2500 ml  Net            -1660 ml   Filed Weights   09/04/16 0943 09/05/16 0655  Weight: 270 lb (122.5 kg) 271 lb 2.7 oz (123 kg)    Telemetry   NSR PVC triplet - Personally Reviewed  ECG    NSR 1st degree AVB. Otherwise normal. - Personally Reviewed  Physical Exam   GEN: No acute distress.  Neck: No JVD Cardiac: RRR, no murmurs, rubs, or gallops.  Respiratory: Clear to auscultation bilaterally. GI: Soft, nontender, non-distended  MS: No edema; No deformity. Neuro:  AAOx3. Psych: Normal affect  Labs    Chemistry Recent Labs Lab 09/02/16 1057 09/05/16 0237  NA 144 139  K 4.2 4.4  CL 102 107  CO2 26 24  GLUCOSE 127* 129*  BUN 16 16  CREATININE 0.82 0.88  CALCIUM 9.5 9.9    GFRNONAA 105 >60  GFRAA 122 >60  ANIONGAP  --  8     Hematology Recent Labs Lab 09/02/16 1057 09/05/16 0237  WBC 8.6 8.4  RBC 4.65 4.53  HGB  --  15.0  HCT 44.7 44.0  MCV 96 97.1  MCH 33.3* 33.1  MCHC 34.7 34.1  RDW 13.0 12.8  PLT 162 135*    Cardiac EnzymesNo results for input(s): TROPONINI in the last 168 hours. No results for input(s): TROPIPOC in the last 168 hours.   BNPNo results for input(s): BNP, PROBNP in the last 168 hours.   DDimer No results for input(s): DDIMER in the last 168 hours.   Radiology    No results found.  Cardiac Studies   Cardiac cath/PCI 09/04/16:Conclusion     Mid Cx lesion, 25 %stenosed.  Prox RCA lesion, 35 %stenosed.  Mid RCA-1 lesion, 80 %stenosed, in stent restenosis of prior stent. This was treated with scoring balloon angioplasty. Post intervention, there is a 10% residual  stenosis.  The left ventricular ejection fraction is 45-50% by visual estimate.  There is mild left ventricular systolic dysfunction.  LV end diastolic pressure is normal.  There is no aortic valve stenosis.  Mid RCA-2 lesion, 90 %stenosed, past the stent. Unable to cross this lesion with a balloon.   He needs aggressive secondary prevention.  He needs to stop smoking.    Plan for orbital atherectomy tomorrow of this mid RCA lesion.    Of note, he is starting a new job next week and [place] Monday.        Patient Profile     46 y.o. male s/p stenting of RCA in 2016, PAF, DM type 2, HTN, HLD and tobacco abuse presented with chest pain and abnormal Myoview showing inferior and apical ischemia.   Assessment & Plan    1. CAD with angina pectoris. Abnormal Myoview. Diagnostic cath showed severe in stent restenosis of RCA stent and severe stenosis distal to stent. POBA of the stent lesion. Unable to cross other lesion due to severe calcification. Plan to return to cath lab today for atherectomy and stenting.  Post PCI will be on DAPT with ASA and Plavix. Resume Eliquis. Stop ASA at one month. 2. DM type 2. Metformin on hold for dye procedure 3. HTN controlled. 4. Hyperlipidemia. On lipitor. LDL 54.  5. Tobacco abuse. Recommend smoking cessation 6. PAF. On chronic eliquis and metoprolol.  7. Ischemic cardiomyopathy EF 40-45%. ACEi added to beta blocker therapy.  Signed, Shylah Dossantos [name], MD  09/05/2016, 7:35 AM

## 2016-09-05 NOTE — Progress Notes (Signed)
CARDIAC REHAB PHASE I   Hold ambulation today per RN, pt awaiting PCI.  Began PCI education.  Reviewed risk factors, tobacco cessation (gave pt fake cigarette), anti-platelet therapy, activity restrictions, ntg, s/s/ heart failure, daily weights, sodium restrictions,  heart healthy diet, carb counting, and phase 2 cardiac rehab. 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 Pt agrees to phase 2 cardiac rehab referral, will send to [facility], however, pt states that his work schedule will not allow him to participate in cardiac rehab. Encouraged pt to review/read diet/smoking cessation handouts. Pt verbalized understanding. Pt in bed, call bell within reach, will follow up tomorrow for ambulation and to review exercise guidelines.     [phone] Joylene GrapesEmily C Francely Craw, RN, BSN 09/05/2016 10:41 AM

## 2016-09-05 NOTE — H&P (View-Only) (Signed)
Progress Note  Patient Name: Ian [name] Date of Encounter: 09/05/2016  Primary Cardiologist: Tobias Alexander MD  Subjective   Feels well. No chest pain. No dyspnea.   Inpatient Medications    Scheduled Meds: . aspirin  81 mg Oral Daily  . atorvastatin  80 mg Oral q1800  . clopidogrel  75 mg Oral Daily  . clopidogrel  75 mg Oral Q breakfast  . diltiazem  120 mg Oral Daily  . furosemide  20 mg Oral Daily  . glipiZIDE  5 mg Oral QAC breakfast  . lisinopril  2.5 mg Oral Daily  . metoprolol tartrate  12.5 mg Oral BID  . sodium chloride flush  3 mL Intravenous Q12H   Continuous Infusions:  PRN Meds: sodium chloride, acetaminophen, nitroGLYCERIN, ondansetron (ZOFRAN) IV, sodium chloride flush   Vital Signs    Vitals:   09/04/16 1415 09/04/16 1600 09/04/16 2026 09/05/16 0655  BP: 106/65 (!) 107/52 113/63 117/61  Pulse:  66 70 (!) 58  Resp: 18 20 15 12   Temp:  97.6 F (36.4 C) 97.8 F (36.6 C) 98.1 F (36.7 C)  TempSrc:  Oral Oral Oral  SpO2: 96% 96% 96% 96%  Weight:    271 lb 2.7 oz (123 kg)  Height:        Intake/Output Summary (Last 24 hours) at 09/05/16 0735 Last data filed at 09/05/16 0659  Gross per 24 hour  Intake              840 ml  Output             2500 ml  Net            -1660 ml   Filed Weights   09/04/16 0943 09/05/16 0655  Weight: 270 lb (122.5 kg) 271 lb 2.7 oz (123 kg)    Telemetry   NSR PVC triplet - Personally Reviewed  ECG    NSR 1st degree AVB. Otherwise normal. - Personally Reviewed  Physical Exam   GEN: No acute distress.  Neck: No JVD Cardiac: RRR, no murmurs, rubs, or gallops.  Respiratory: Clear to auscultation bilaterally. GI: Soft, nontender, non-distended  MS: No edema; No deformity. Neuro:  AAOx3. Psych: Normal affect  Labs    Chemistry Recent Labs Lab 09/02/16 1057 09/05/16 0237  NA 144 139  K 4.2 4.4  CL 102 107  CO2 26 24  GLUCOSE 127* 129*  BUN 16 16  CREATININE 0.82 0.88  CALCIUM 9.5 9.9    GFRNONAA 105 >60  GFRAA 122 >60  ANIONGAP  --  8     Hematology Recent Labs Lab 09/02/16 1057 09/05/16 0237  WBC 8.6 8.4  RBC 4.65 4.53  HGB  --  15.0  HCT 44.7 44.0  MCV 96 97.1  MCH 33.3* 33.1  MCHC 34.7 34.1  RDW 13.0 12.8  PLT 162 135*    Cardiac EnzymesNo results for input(s): TROPONINI in the last 168 hours. No results for input(s): TROPIPOC in the last 168 hours.   BNPNo results for input(s): BNP, PROBNP in the last 168 hours.   DDimer No results for input(s): DDIMER in the last 168 hours.   Radiology    No results found.  Cardiac Studies   Cardiac cath/PCI 09/04/16:Conclusion     Mid Cx lesion, 25 %stenosed.  Prox RCA lesion, 35 %stenosed.  Mid RCA-1 lesion, 80 %stenosed, in stent restenosis of prior stent. This was treated with scoring balloon angioplasty. Post intervention, there is a 10% residual  stenosis.  The left ventricular ejection fraction is 45-50% by visual estimate.  There is mild left ventricular systolic dysfunction.  LV end diastolic pressure is normal.  There is no aortic valve stenosis.  Mid RCA-2 lesion, 90 %stenosed, past the stent. Unable to cross this lesion with a balloon.   He needs aggressive secondary prevention.  He needs to stop smoking.    Plan for orbital atherectomy tomorrow of this mid RCA lesion.    Of note, he is starting a new job next week and is [place] to [place] in [place] on Monday.        Patient Profile     46 y.o. male s/p stenting of RCA in 2016, PAF, DM type 2, HTN, HLD and tobacco abuse presented with chest pain and abnormal Myoview showing inferior and apical ischemia.   Assessment & Plan    1. CAD with angina pectoris. Abnormal Myoview. Diagnostic cath showed severe in stent restenosis of RCA stent and severe stenosis distal to stent. POBA of the stent lesion. Unable to cross other lesion due to severe calcification. Plan to return to cath lab today for atherectomy and stenting.  Post PCI will be on DAPT with ASA and Plavix. Resume Eliquis. Stop ASA at one month. 2. DM type 2. Metformin on hold for dye procedure 3. HTN controlled. 4. Hyperlipidemia. On lipitor. LDL 54.  5. Tobacco abuse. Recommend smoking cessation 6. PAF. On chronic eliquis and metoprolol.  7. Ischemic cardiomyopathy EF 40-45%. ACEi added to beta blocker therapy.  Signed, Kyrstin Campillo [name], MD  09/05/2016, 7:35 AM

## 2016-09-06 ENCOUNTER — Encounter (HOSPITAL_COMMUNITY): Payer: Self-pay | Admitting: Cardiology

## 2016-09-06 DIAGNOSIS — T82855A Stenosis of coronary artery stent, initial encounter: Secondary | ICD-10-CM | POA: Diagnosis present

## 2016-09-06 DIAGNOSIS — I252 Old myocardial infarction: Secondary | ICD-10-CM | POA: Diagnosis not present

## 2016-09-06 DIAGNOSIS — I25118 Atherosclerotic heart disease of native coronary artery with other forms of angina pectoris: Secondary | ICD-10-CM | POA: Diagnosis present

## 2016-09-06 DIAGNOSIS — Y831 Surgical operation with implant of artificial internal device as the cause of abnormal reaction of the patient, or of later complication, without mention of misadventure at the time of the procedure: Secondary | ICD-10-CM | POA: Diagnosis present

## 2016-09-06 DIAGNOSIS — Z79899 Other long term (current) drug therapy: Secondary | ICD-10-CM | POA: Diagnosis not present

## 2016-09-06 DIAGNOSIS — E785 Hyperlipidemia, unspecified: Secondary | ICD-10-CM | POA: Diagnosis present

## 2016-09-06 DIAGNOSIS — I480 Paroxysmal atrial fibrillation: Secondary | ICD-10-CM | POA: Diagnosis present

## 2016-09-06 DIAGNOSIS — I110 Hypertensive heart disease with heart failure: Secondary | ICD-10-CM | POA: Diagnosis present

## 2016-09-06 DIAGNOSIS — E669 Obesity, unspecified: Secondary | ICD-10-CM | POA: Diagnosis present

## 2016-09-06 DIAGNOSIS — R0789 Other chest pain: Secondary | ICD-10-CM | POA: Diagnosis present

## 2016-09-06 DIAGNOSIS — I5020 Unspecified systolic (congestive) heart failure: Secondary | ICD-10-CM | POA: Diagnosis present

## 2016-09-06 DIAGNOSIS — I25119 Atherosclerotic heart disease of native coronary artery with unspecified angina pectoris: Secondary | ICD-10-CM

## 2016-09-06 DIAGNOSIS — R9439 Abnormal result of other cardiovascular function study: Secondary | ICD-10-CM

## 2016-09-06 DIAGNOSIS — Z7901 Long term (current) use of anticoagulants: Secondary | ICD-10-CM | POA: Diagnosis not present

## 2016-09-06 DIAGNOSIS — I209 Angina pectoris, unspecified: Secondary | ICD-10-CM | POA: Diagnosis not present

## 2016-09-06 DIAGNOSIS — E119 Type 2 diabetes mellitus without complications: Secondary | ICD-10-CM | POA: Diagnosis present

## 2016-09-06 DIAGNOSIS — Z833 Family history of diabetes mellitus: Secondary | ICD-10-CM | POA: Diagnosis not present

## 2016-09-06 DIAGNOSIS — F17210 Nicotine dependence, cigarettes, uncomplicated: Secondary | ICD-10-CM | POA: Diagnosis present

## 2016-09-06 DIAGNOSIS — Z8249 Family history of ischemic heart disease and other diseases of the circulatory system: Secondary | ICD-10-CM | POA: Diagnosis not present

## 2016-09-06 DIAGNOSIS — Z7984 Long term (current) use of oral hypoglycemic drugs: Secondary | ICD-10-CM | POA: Diagnosis not present

## 2016-09-06 DIAGNOSIS — I255 Ischemic cardiomyopathy: Secondary | ICD-10-CM | POA: Diagnosis present

## 2016-09-06 DIAGNOSIS — Z7902 Long term (current) use of antithrombotics/antiplatelets: Secondary | ICD-10-CM | POA: Diagnosis not present

## 2016-09-06 LAB — BASIC METABOLIC PANEL
Anion gap: 7 (ref 5–15)
BUN: 18 mg/dL (ref 6–20)
CHLORIDE: 102 mmol/L (ref 101–111)
CO2: 28 mmol/L (ref 22–32)
Calcium: 9.6 mg/dL (ref 8.9–10.3)
Creatinine, Ser: 1.04 mg/dL (ref 0.61–1.24)
GFR calc Af Amer: >60 mL/min (ref >60)
GFR calc non Af Amer: >60 mL/min (ref >60)
GLUCOSE: 130 mg/dL — AB (ref 65–99)
POTASSIUM: 4.5 mmol/L (ref 3.5–5.1)
SODIUM: 137 mmol/L (ref 135–145)

## 2016-09-06 LAB — CBC
HEMATOCRIT: 41.7 % (ref 39.0–52.0)
Hemoglobin: 14.3 g/dL (ref 13.0–17.0)
MCH: 33.2 pg (ref 26.0–34.0)
MCHC: 34.3 g/dL (ref 30.0–36.0)
MCV: 96.8 fL (ref 78.0–100.0)
Platelets: 131 10*3/uL — ABNORMAL LOW (ref 150–400)
RBC: 4.31 MIL/uL (ref 4.22–5.81)
RDW: 12.4 % (ref 11.5–15.5)
WBC: 8.5 10*3/uL (ref 4.0–10.5)

## 2016-09-06 LAB — GLUCOSE, CAPILLARY: Glucose-Capillary: 129 mg/dL — ABNORMAL HIGH (ref 65–99)

## 2016-09-06 MED ORDER — ASPIRIN 81 MG PO CHEW: 81.0000 mg | CHEWABLE_TABLET | Freq: Every day | ORAL | Status: DC

## 2016-09-06 MED ORDER — APIXABAN 5 MG PO TABS
5.0000 mg | ORAL_TABLET | Freq: Two times a day (BID) | ORAL | Status: DC
  Administered 2016-09-06: 10:00:00 5 mg via ORAL
  Filled 2016-09-06: qty 1

## 2016-09-06 MED FILL — Nitroglycerin IV Soln 100 MCG/ML in D5W: INTRA_ARTERIAL | Qty: 10 | Status: AC

## 2016-09-06 NOTE — Progress Notes (Signed)
CARDIAC REHAB PHASE I   PRE:  Rate/Rhythm: 63 SR    BP: sitting 111/59    SaO2:   MODE:  Ambulation: 500 ft   POST:  Rate/Rhythm: 82 SR    BP: sitting 148/95     SaO2:   Tolerated well except BP elevated after walk. Ed completed, reinforcing meds, smoking cessation, diet, ex, NTG. Voiced understanding and ready to make change. Discussed risks of vaping and encouraged other methods if possible. Pt receptive. [phone]   Harriet MassonRandi Kristan Damary Doland CES, ACSM 09/06/2016 8:41 AM

## 2016-09-06 NOTE — Care Management Note (Signed)
Case Management Note  Patient Details  Name: Sharion BalloonCraig Deroo MRN: [id number] Date of Birth: 05-06-1971  Subjective/Objective:    S/p  Coronary balloon angioplasty, will be on plavix/asa, for dc today, no needs.                Action/Plan:   Expected Discharge Date:  09/06/16               Expected Discharge Plan:  Home/Self Care  In-House Referral:     Discharge planning Services  CM Consult  Post Acute Care Choice:    Choice offered to:     DME Arranged:    DME Agency:     HH Arranged:    HH Agency:     Status of Service:  Completed, signed off  If discussed at [employer] Length of Stay Meetings, dates discussed:    Additional Comments:  Leone Havenaylor, Arkie Tagliaferro [place], RN 09/06/2016, 11:43 AM

## 2016-09-06 NOTE — Progress Notes (Signed)
Patient Name: Ian [name] Date of Encounter: 09/06/2016  Primary Cardiologist: Dr. Memory Dance Problem List     Active Problems:   Coronary artery disease of native artery of native heart with stable angina pectoris (HCC)   Angina pectoris (HCC)   S/P PTCA (percutaneous transluminal coronary angioplasty)   Coronary artery disease involving native coronary artery of native heart with angina pectoris (HCC)     Subjective   Feeling good. Ready to go home   Inpatient Medications    Scheduled Meds: . aspirin  81 mg Oral Daily  . atorvastatin  80 mg Oral q1800  . clopidogrel  75 mg Oral Daily  . clopidogrel  75 mg Oral Q breakfast  . diltiazem  120 mg Oral Daily  . furosemide  20 mg Oral Daily  . glipiZIDE  5 mg Oral QAC breakfast  . lisinopril  2.5 mg Oral Daily  . metoprolol tartrate  12.5 mg Oral BID  . sodium chloride flush  3 mL Intravenous Q12H  . sodium chloride flush  3 mL Intravenous Q12H   Continuous Infusions:  PRN Meds: sodium chloride, sodium chloride, acetaminophen, nitroGLYCERIN, ondansetron (ZOFRAN) IV, sodium chloride flush, sodium chloride flush   Vital Signs    Vitals:   09/05/16 1947 09/05/16 2000 09/05/16 2100 09/06/16 0425  BP: (!) 156/91 (!) 149/71 (!) 179/90 120/74  Pulse: 63   62  Resp: 13 20 15 16   Temp: 98 F (36.7 C)   97.1 F (36.2 C)  TempSrc: Oral   Axillary  SpO2: 99%   99%  Weight:    271 lb 13.2 oz (123.3 kg)  Height:        Intake/Output Summary (Last 24 hours) at 09/06/16 0716 Last data filed at 09/05/16 2231  Gross per 24 hour  Intake           578.33 ml  Output             1700 ml  Net         -1121.67 ml   Filed Weights   09/04/16 0943 09/05/16 0655 09/06/16 0425  Weight: 270 lb (122.5 kg) 271 lb 2.7 oz (123 kg) 271 lb 13.2 oz (123.3 kg)    Physical Exam   GEN: Well nourished, well developed, in no acute distress. obese HEENT: Grossly normal.  Neck: Supple, no JVD, carotid bruits, or masses. Cardiac:  RRR, no murmurs, rubs, or gallops. No clubbing, cyanosis, edema.  Radials/DP/PT 2+ and equal bilaterally.  Respiratory:  Respirations regular and unlabored, clear to auscultation bilaterally. GI: Soft, nontender, nondistended, BS + x 4. MS: no deformity or atrophy. Skin: warm and dry, no rash. Neuro:  Strength and sensation are intact. Psych: AAOx3.  Normal affect.  Labs    CBC  Recent Labs  09/05/16 0237 09/06/16 0224  WBC 8.4 8.5  HGB 15.0 14.3  HCT 44.0 41.7  MCV 97.1 96.8  PLT 135* 131*   Basic Metabolic Panel  Recent Labs  09/05/16 0237 09/06/16 0224  NA 139 137  K 4.4 4.5  CL 107 102  CO2 24 28  GLUCOSE 129* 130*  BUN 16 18  CREATININE 0.88 1.04  CALCIUM 9.9 9.6   Liver Function Tests No results for input(s): AST, ALT, ALKPHOS, BILITOT, PROT, ALBUMIN in the last 72 hours. No results for input(s): LIPASE, AMYLASE in the last 72 hours. Cardiac Enzymes No results for input(s): CKTOTAL, CKMB, CKMBINDEX, TROPONINI in the last 72 hours. BNP Invalid input(s): POCBNP D-Dimer  No results for input(s): DDIMER in the last 72 hours. Hemoglobin A1C No results for input(s): HGBA1C in the last 72 hours. Fasting Lipid Panel No results for input(s): CHOL, HDL, LDLCALC, TRIG, CHOLHDL, LDLDIRECT in the last 72 hours. Thyroid Function Tests No results for input(s): TSH, T4TOTAL, T3FREE, THYROIDAB in the last 72 hours.  Invalid input(s): FREET3  Telemetry    NSR - Personally Reviewed  ECG    Sinus bradycardia HR 54 - Personally Reviewed  Radiology    No results found.  Cardiac Studies   09/04/16 Procedures  Coronary [name] Angioplasty  Left Heart Cath and Coronary Angiography  Conclusion    Mid Cx lesion, 25 %stenosed.  Prox RCA lesion, 35 %stenosed.  Mid RCA-1 lesion, 80 %stenosed, in stent restenosis of prior stent. This was treated with scoring [name] angioplasty. Post intervention, there is a 10% residual stenosis.  The left ventricular ejection  fraction is 45-50% by visual estimate.  There is mild left ventricular systolic dysfunction.  LV end diastolic pressure is normal.  There is no aortic valve stenosis.  Mid RCA-2 lesion, 90 %stenosed, past the stent. Unable to cross this lesion with a [name].   He needs aggressive secondary prevention.  He needs to stop smoking.    Plan for orbital atherectomy tomorrow of this mid RCA lesion.    Of note, he is starting a new job next week and is [place] to [place] in [place] on Monday.       09/05/16 Procedures  Coronary Stent Intervention  Coronary Atherectomy  Temporary Pacemaker  Conclusion     Prox RCA stent, 60 %stenosed. Noncompliant [name] angioplasty performed for in-stent restenosis. Post intervention, there is a 5% residual stenosis.  _____________________________  Mid RCA lesion beyond the stent, 85 %stenosed. Tandem calcified lesions  Orbital Atherecomy using CSI Diamondback catheter was performed.  A STENT SYNERGY DES 3.5X32 drug eluting stent was successfully placed, and overlaps previously placed stent. (post-dilated to 3.8 mm, overlap was dilated to 4.1 mm)  Post intervention, there is a 0% residual stenosis.   Successful orbital atherectomy based PCI of the distal portion of the mid RCA overlapping the more proximal stent with another Synergy DES stent. This was preceded by repeat [name] angioplasty of the more proximal stent using a high pressure noncompliant [name].  Plan:  Patient will return to [facility] postprocedure unit for sheath removal  Continue dual independent therapy  Smoking cessation counseling  Anticipate discharge tomorrow      Patient Profile     Ian BalloonCraig [name] is a 46 y.o. male with a history of CAD s/p DES to RCA, tobacco abuse, obesity, ischemic CM (EF 45%), PAF on Eliquis, DMT2, HTN, HLD and tobacco abuse who [facility] for planned heart cath after an abnormal stress test.     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 Set up for LHC on 09/04/16 which showed 80% mRCA ISR s/p scoring [name] angioplasty, there was also 90% mRCA stenosis just past the stent but Dr. Eldridge DaceVaranasi was unable to cross the lesion with a [name]. He underwent staged orbital atherectomy and DES placement (overlapping previous stent) to mid RCA lesion on 09/05/16. -- Will plan for triple therapy with ASA, plavix and Eliquis x1 month and then drop ASA. Continue BB and statin  HTN: BPs were elevated yesterday but better today.   LV dysfunction/Ischemic CM: no s/s CHF.  Continue BB and ACE  HLD: continue statin  DMT2: hold metformin at least 48 hours after heart cath   PAF: maintaining NSR. Will resume home Eliquis today. CHADSVSAC at least 4 (CHF, HTN, DM, vasc dz)  Tobacco abuse: counseled on cessation. Thinks he may try vaping   Signed, Cline Crock, [name]  09/06/2016, 7:16 AM  Patient seen and examined and history reviewed. Agree with above findings and plan. Doing well this am. No complaints. Ecg stable. VSS. No groin hematoma. Will resume Eliquis today. Continue DAPT for one month then stop ASA. OK for DC home today.  Llana Deshazo [place], MDFACC 09/06/2016 9:04 AM

## 2016-09-06 NOTE — Discharge Summary (Signed)
Discharge Summary    Patient ID: Ian [name],  MRN: [id number], DOB/AGE: 1971-01-11 46 y.o.  Admit date: 09/04/2016 Discharge date: 09/06/2016  Primary Care Provider: [name] Primary Cardiologist: Dr [name]   Discharge Diagnoses    Principal Problem:   Abnormal nuclear stress test Active Problems:   Hyperlipidemia   Type 2 diabetes mellitus (HCC)   Tobacco abuse   PAF (paroxysmal atrial fibrillation) (HCC)   Essential hypertension   Ischemic cardiomyopathy   Allergies No Known Allergies   History of Present Illness     Ian [name] is a 46 y.o. male with a history of CAD s/p DES to RCA, tobacco abuse, obesity, ischemic CM (EF 45%), PAF on Eliquis, DMT2, HTN, HLD and tobacco abuse who presented to [facility] on 09/04/16 for planned heart cath after an abnormal stress test.   In 2016, he presented with a NSTEMI + atrial fibrillation w/ RVR. LHC demonstrated 90% RCA disease, treated with PCI + DES. There was mild residual 25% mid circumflex lesion. EF was 45-50%. He spontaneously converted to NSR but was placed on anticoagulation with Eliquis given a CHA2DS2 VASc score >2. He was on tripple therapy with ASA, Plavix and Eliquis x 30 days. ASA was then discontinued. He has since been on Plavix + Eliquis.   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 This returned abnormal for possible inferior ischemia and he was set up for [facility] on 09/04/16.  Hospital Course     Consultants: none   CAD: abnormal stress test on 08/30/16 c/w inferior ischemia. Set up for LHC on 09/04/16 which showed 80% mRCA ISR s/p scoring balloon angioplasty, there was also 90% mRCA stenosis just past the stent but Dr. [name] was unable to cross the lesion with a balloon. He underwent staged orbital atherectomy and DES placement (overlapping previous stent) to mid RCA lesion on 09/05/16. -- Will plan for triple therapy with ASA, plavix and Eliquis x1 month and then drop  ASA. Continue BB and statin  HTN: BPs were elevated yesterday but better today.   LV dysfunction/Ischemic CM: no s/s CHF. Continue BB and ACE  HLD: continue statin  DMT2: hold metformin at least 48 hours after heart cath (documented in discharge instructions)  PAF: maintaining NSR. Will resume home Eliquis today. CHADSVSAC at least 4 (CHF, HTN, DM, vasc dz)  Tobacco abuse: counseled on cessation. Thinks he may try vaping   The patient has had an uncomplicated hospital course and is recovering well. The radial catheter site is stable He has been seen by Dr. [place] today and deemed ready for discharge home. All follow-up appointments have been scheduled. Smoking cessation was disscussed in length. Discharge medications are listed below.  _____________  Discharge Vitals Blood pressure (!) 111/59, pulse 62, temperature 97.8 F (36.6 C), temperature source Oral, resp. rate 16, height 5\' 11"  (1.803 m), weight 271 lb 13.2 oz (123.3 kg), SpO2 99 %.  Filed Weights   09/04/16 0943 09/05/16 0655 09/06/16 0425  Weight: 270 lb (122.5 kg) 271 lb 2.7 oz (123 kg) 271 lb 13.2 oz (123.3 kg)    Labs & Radiologic Studies     CBC  Recent Labs  09/05/16 0237 09/06/16 0224  WBC 8.4 8.5  HGB 15.0 14.3  HCT 44.0 41.7  MCV 97.1 96.8  PLT 135* 131*   Basic Metabolic Panel  Recent Labs  09/05/16 0237 09/06/16 0224  NA 139 137  K 4.4 4.5  CL 107 102  CO2 24 28  GLUCOSE 129* 130*  BUN 16 18  CREATININE 0.88 1.04  CALCIUM 9.9 9.6   Liver Function Tests No results for input(s): AST, ALT, ALKPHOS, BILITOT, PROT, ALBUMIN in the last 72 hours. No results for input(s): LIPASE, AMYLASE in the last 72 hours. Cardiac Enzymes No results for input(s): CKTOTAL, CKMB, CKMBINDEX, TROPONINI in the last 72 hours. BNP Invalid input(s): POCBNP D-Dimer No results for input(s): DDIMER in the last 72 hours. Hemoglobin A1C No results for input(s): HGBA1C in the last 72 hours. Fasting Lipid  Panel No results for input(s): CHOL, HDL, LDLCALC, TRIG, CHOLHDL, LDLDIRECT in the last 72 hours. Thyroid Function Tests No results for input(s): TSH, T4TOTAL, T3FREE, THYROIDAB in the last 72 hours.  Invalid input(s): FREET3  No results found.   Diagnostic Studies/Procedures  09/04/16 Procedures  Coronary Balloon Angioplasty  Left Heart Cath and Coronary Angiography  Conclusion    Mid Cx lesion, 25 %stenosed.  Prox RCA lesion, 35 %stenosed.  Mid RCA-1 lesion, 80 %stenosed, in stent restenosis of prior stent. This was treated with scoring balloon angioplasty. Post intervention, there is a 10% residual stenosis.  The left ventricular ejection fraction is 45-50% by visual estimate.  There is mild left ventricular systolic dysfunction.  LV end diastolic pressure is normal.  There is no aortic valve stenosis.  Mid RCA-2 lesion, 90 %stenosed, past the stent. Unable to cross this lesion with a balloon.  He needs aggressive secondary prevention. He needs to stop smoking.   Plan for orbital atherectomy tomorrow of this mid RCA lesion.   Of note, he is starting a new job next week and [place] to [place] in [place] on Monday.      09/05/16 Procedures  Coronary Stent Intervention  Coronary Atherectomy  Temporary Pacemaker  Conclusion     Prox RCA stent, 60 %stenosed. Noncompliant balloon angioplasty performed for in-stent restenosis. Post intervention, there is a 5% residual stenosis.  _____________________________  Mid RCA lesion beyond the stent, 85 %stenosed. Tandem calcified lesions  Orbital Atherecomy using CSI Diamondback catheter was performed.  A STENT SYNERGY DES 3.5X32 drug eluting stent was successfully placed, and overlaps previously placed stent. (post-dilated to 3.8 mm, overlap was dilated to 4.1 mm)  Post intervention, there is a 0% residual stenosis.  Successful orbital atherectomy based PCI of the distal portion of the mid  RCA overlapping the more proximal stent with another Synergy DES stent. This was preceded by repeat balloon angioplasty of the more proximal stent using a high pressure noncompliant balloon.  Plan:  Patient will return to [facility] postprocedure unit for sheath removal  Continue dual independent therapy  Smoking cessation counseling  Anticipate discharge tomorrow     _____________    Disposition   Pt is being discharged home today in good condition.  Follow-up Plans & Appointments    Follow-up Information    Robbie LisBrittainy Simmons, [name] Follow up on 09/17/2016.   Specialties:  Cardiology, Radiology Why:  @ 8am Contact information: [address] [place] [address] [phone]          Discharge Instructions    Amb Referral to Cardiac Rehabilitation    Complete by:  As directed    Diagnosis:  PTCA Comment - To [facility]      Discharge Medications     Medication List    TAKE these medications   apixaban 5 MG Tabs tablet Commonly known as:  ELIQUIS Take 1 tablet (5 mg total)  by mouth 2 (two) times daily.   aspirin 81 MG chewable tablet Chew 1 tablet (81 mg total) by mouth daily.   atorvastatin 80 MG tablet Commonly known as:  LIPITOR Take 1 tablet (80 mg total) by mouth daily at 6 PM. What changed:  when to take this   clopidogrel 75 MG tablet Commonly known as:  PLAVIX Take 1 tablet (75 mg total) by mouth daily.   diltiazem 120 MG 24 hr capsule Commonly known as:  CARDIZEM CD Take 1 capsule (120 mg total) by mouth daily.   Fish Oil 1200 MG Caps Take 1,200 mg by mouth daily.   furosemide 20 MG tablet Commonly known as:  LASIX Take 1 tablet (20 mg total) by mouth daily.   glipiZIDE 5 MG tablet Commonly known as:  GLUCOTROL Take 5 mg by mouth daily before breakfast.   lisinopril 2.5 MG tablet Commonly known as:  PRINIVIL,ZESTRIL Take 1 tablet (2.5 mg total) by mouth daily.   metFORMIN 500 MG tablet Commonly known as:   GLUCOPHAGE Take 1 tablet (500 mg total) by mouth 2 (two) times daily with a meal.   metoprolol tartrate 25 MG tablet Commonly known as:  LOPRESSOR Take 0.5 tablets (12.5 mg total) by mouth 2 (two) times daily.   multivitamin tablet Take 1 tablet by mouth daily.   nitroGLYCERIN 0.4 MG SL tablet Commonly known as:  NITROSTAT Place 1 tablet (0.4 mg total) under the tongue every 5 (five) minutes x 3 doses as needed for chest pain.   OVER THE COUNTER MEDICATION diabazole - 1 capsule by mouth once daily         Outstanding Labs/Studies   none  Duration of Discharge Encounter   Greater than 30 minutes including physician time.  Signed, Cline Crock [name] 09/06/2016, 10:11 AM

## 2016-09-06 NOTE — Discharge Instructions (Signed)
Radial Site Care Refer to this sheet in the next few weeks. These instructions provide you with information on caring for yourself after your procedure. Your caregiver may also give you more specific instructions. Your treatment has been planned according to current medical practices, but problems sometimes occur. Call your caregiver if you have any problems or questions after your procedure. HOME CARE INSTRUCTIONS  You may shower the day after the procedure.Remove the bandage (dressing) and gently wash the site with plain soap and water.Gently pat the site dry.   Do not apply powder or lotion to the site.   Do not submerge the affected site in water for 3 to 5 days.   Inspect the site at least twice daily.   Do not flex or bend the affected arm for 24 hours.   No lifting over 5 pounds (2.3 kg) for 5 days after your procedure.   Do not drive home if you are discharged the same day of the procedure. Have someone else drive you.   You may drive 24 hours after the procedure unless otherwise instructed by your caregiver.  What to expect:  Any bruising will usually fade within 1 to 2 weeks.   Blood that collects in the tissue (hematoma) may be painful to the touch. It should usually decrease in size and tenderness within 1 to 2 weeks.  SEEK IMMEDIATE MEDICAL CARE IF:  You have unusual pain at the radial site.   You have redness, warmth, swelling, or pain at the radial site.   You have drainage (other than a small amount of blood on the dressing).   You have chills.   You have a fever or persistent symptoms for more than 72 hours.   You have a fever and your symptoms suddenly get worse.   Your arm becomes pale, cool, tingly, or numb.   You have heavy bleeding from the site. Hold pressure on the site.   Information on my medicine - ELIQUIS (apixaban)   Why was Eliquis prescribed for you? Eliquis was prescribed for you to reduce the risk of forming blood clots that can  cause a stroke if you have a medical condition called atrial fibrillation (a type of irregular heartbeat) OR to reduce the risk of a blood clots forming after orthopedic surgery.  What do You need to know about Eliquis ? Take your Eliquis TWICE DAILY - one tablet in the morning and one tablet in the evening with or without food.  It would be best to take the doses about the same time each day.  If you have difficulty swallowing the tablet whole please discuss with your pharmacist how to take the medication safely.  Take Eliquis exactly as prescribed by your doctor and DO NOT stop taking Eliquis without talking to the doctor who prescribed the medication.  Stopping may increase your risk of developing a new clot or stroke.  Refill your prescription before you run out.  After discharge, you should have regular check-up appointments with your healthcare provider that is prescribing your Eliquis.  In the future your dose may need to be changed if your kidney function or weight changes by a significant amount or as you get older.  What do you do if you miss a dose? If you miss a dose, take it as soon as you remember on the same day and resume taking twice daily.  Do not take more than one dose of ELIQUIS at the same time.  Important Safety Information A  possible side effect of Eliquis is bleeding. You should call your healthcare provider right away if you experience any of the following: ? Bleeding from an injury or your nose that does not stop. ? Unusual colored urine (red or dark brown) or unusual colored stools (red or black). ? Unusual bruising for unknown reasons. ? A serious fall or if you hit your head (even if there is no bleeding).  Some medicines may interact with Eliquis and might increase your risk of bleeding or clotting while on Eliquis. To help avoid this, consult your healthcare provider or pharmacist prior to using any new prescription or non-prescription medications,  including herbals, vitamins, non-steroidal anti-inflammatory drugs (NSAIDs) and supplements.  This website has more information on Eliquis (apixaban): [email or web address].

## 2016-09-06 NOTE — Progress Notes (Signed)
Discharge instructions given. Pt verbalized understanding and all questions were answered.  

## 2016-09-09 ENCOUNTER — Ambulatory Visit (INDEPENDENT_AMBULATORY_CARE_PROVIDER_SITE_OTHER): Payer: BLUE CROSS/BLUE SHIELD | Admitting: Nurse Practitioner

## 2016-09-09 ENCOUNTER — Encounter: Payer: Self-pay | Admitting: Nurse Practitioner

## 2016-09-09 ENCOUNTER — Telehealth: Payer: Self-pay | Admitting: *Deleted

## 2016-09-09 VITALS — BP 112/80 | HR 76 | Ht 71.0 in | Wt 267.8 lb

## 2016-09-09 DIAGNOSIS — I2583 Coronary atherosclerosis due to lipid rich plaque: Secondary | ICD-10-CM

## 2016-09-09 DIAGNOSIS — Z79899 Other long term (current) drug therapy: Secondary | ICD-10-CM | POA: Diagnosis not present

## 2016-09-09 DIAGNOSIS — Z955 Presence of coronary angioplasty implant and graft: Secondary | ICD-10-CM | POA: Diagnosis not present

## 2016-09-09 DIAGNOSIS — I2510 Atherosclerotic heart disease of native coronary artery without angina pectoris: Secondary | ICD-10-CM

## 2016-09-09 DIAGNOSIS — I255 Ischemic cardiomyopathy: Secondary | ICD-10-CM

## 2016-09-09 DIAGNOSIS — I480 Paroxysmal atrial fibrillation: Secondary | ICD-10-CM

## 2016-09-09 NOTE — Progress Notes (Signed)
CARDIOLOGY OFFICE NOTE  Date:  09/09/2016    Ian [name] Date of Birth: 1970-09-29 Medical Record #[id number]  PCP:  [name], MD  Cardiologist:  [name]   Chief Complaint  Patient presents with  . Coronary Artery Disease    Post hospital visit - seen for Dr. [name]    History of Present Illness: Ian [name] is a 46 y.o. male who presents today for a post cardiac cath visit. Seen for Dr. [name].  He has a history of CAD s/p DES to RCA, tobacco abuse, obesity, ischemic CM (EF 45%), PAF on Eliquis, DMT2, HTN, HLD and tobacco abuse.   In 2016, he presented with a NSTEMI + atrial fibrillation w/ RVR. LHC demonstrated 90% RCA disease, treated with PCI + DES. There was mild residual 25% mid circumflex lesion. EF was 45-50%. He spontaneously converted to NSR but was placed on anticoagulation with Eliquis given a CHA2DS2 VASc score >2. He was on tripple therapy with ASA, Plavix and Eliquis x 30 days. ASA was then discontinued. He has since been on Plavix + Eliquis.   He presented to [facility] on 09/04/16 for planned heart cath after an abnormal stress test.  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 This returned abnormal for possible inferior ischemia and he was set up for [facility] on 09/04/16. This showed an 80% mRCA ISR s/p scoring [name] angioplasty. There was also 90% mRCA stenosis just past the stent, but Dr. [name] was unable to cross the lesion with a [name]. He underwent staged orbital atherectomy and DES placement (overlapping previous stent) to mid RCA lesion on 09/05/16. He is back on triple therapy with ASA, plavix, and Eliquis x 1 month and will sten stop ASA. Smoking cessation encouraged.   Comes in today. Here with his wife. 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 Needs ok to return to work. Has been home only 3 days. No problems noted. He was to be in [place] today to start this new job - this has been postponed until next Friday. Not  smoking - vaping instead. No chest pain - he notes that he had "small chest pains" prior to having the stress test. Not dizzy or lightheaded. He has seen his PCP since discharge - labs in CareEverywhere. A1C down to 6.7.   Past Medical History:  Diagnosis Date  . Arthritis    "left ankle" (09/04/2016)  . Complication of anesthesia    "hard to get me out of it sometimes; usually takes me longer to get under"  . Coronary artery disease   . History of palpitations   . HLD (hyperlipidemia)   . Hypertension   . NSTEMI (non-ST elevated myocardial infarction) (HCC) 05/2015   [name] 05/23/2015 "minor one"  . PAF (paroxysmal atrial fibrillation) (HCC)   . Type II diabetes mellitus (HCC)     Past Surgical History:  Procedure Laterality Date  . CARDIAC CATHETERIZATION  2000, 2005   "less than 20% blockage"  . CARDIAC CATHETERIZATION N/A 05/22/2015   Procedure: Left Heart Cath and Coronary Angiography;  Surgeon: [name], MD;  Location: [facility] INVASIVE CV LAB;  Service: Cardiovascular;  Laterality: N/A;  . CARDIAC CATHETERIZATION N/A 05/22/2015   Procedure: Coronary Stent Intervention;  Surgeon: [name], MD;  Location: [facility] INVASIVE CV LAB;  Service: Cardiovascular;  Laterality: N/A;  . CARDIAC CATHETERIZATION N/A 09/04/2016   Procedure: Left Heart Cath and Coronary Angiography;  Surgeon: [name], MD;  Location: [facility] INVASIVE CV LAB;  Service: Cardiovascular;  Laterality: N/A;  . CARDIAC CATHETERIZATION N/A 09/04/2016   Procedure: Coronary [name] Angioplasty;  Surgeon: [name], MD;  Location: [facility] INVASIVE CV LAB;  Service: Cardiovascular;  Laterality: N/A;  . CARDIAC CATHETERIZATION N/A 09/05/2016   Procedure: Coronary Atherectomy;  Surgeon: Marykay Lex, MD;  Location: [facility] INVASIVE CV LAB;  Service: Cardiovascular;  Laterality: N/A;  . CARDIAC CATHETERIZATION N/A 09/05/2016   Procedure: Coronary Stent Intervention;  Surgeon: Marykay Lex, MD;  Location: [facility]  INVASIVE CV LAB;  Service: Cardiovascular;  Laterality: N/A;  . CARDIAC CATHETERIZATION N/A 09/05/2016   Procedure: Temporary Pacemaker;  Surgeon: Marykay Lex, MD;  Location: [facility] INVASIVE CV LAB;  Service: Cardiovascular;  Laterality: N/A;  . CORONARY ANGIOPLASTY    . MULTIPLE TOOTH EXTRACTIONS  2013   "all of them"  . TYMPANOSTOMY TUBE PLACEMENT Bilateral 1970s     Medications: Current Outpatient Prescriptions  Medication Sig Dispense Refill  . apixaban (ELIQUIS) 5 MG TABS tablet Take 1 tablet (5 mg total) by mouth 2 (two) times daily. 180 tablet 3  . aspirin EC 81 MG tablet Take 81 mg by mouth daily.    Marland Kitchen atorvastatin (LIPITOR) 80 MG tablet Take 1 tablet (80 mg total) by mouth daily at 6 PM. (Patient taking differently: Take 80 mg by mouth every evening. ) 30 tablet 11  . clopidogrel (PLAVIX) 75 MG tablet Take 1 tablet (75 mg total) by mouth daily. 90 tablet 3  . diltiazem (CARDIZEM CD) 120 MG 24 hr capsule Take 1 capsule (120 mg total) by mouth daily. 30 capsule 11  . furosemide (LASIX) 20 MG tablet Take 1 tablet (20 mg total) by mouth daily. 90 tablet 3  . glipiZIDE (GLUCOTROL) 5 MG tablet Take 5 mg by mouth daily before breakfast.     . lisinopril (PRINIVIL,ZESTRIL) 2.5 MG tablet Take 1 tablet (2.5 mg total) by mouth daily. 30 tablet 11  . metFORMIN (GLUCOPHAGE) 500 MG tablet Take 1 tablet (500 mg total) by mouth 2 (two) times daily with a meal.    . metoprolol tartrate (LOPRESSOR) 25 MG tablet Take 0.5 tablets (12.5 mg total) by mouth 2 (two) times daily. 60 tablet 5  . Multiple Vitamin (MULTIVITAMIN) tablet Take 1 tablet by mouth daily.    . nitroGLYCERIN (NITROSTAT) 0.4 MG SL tablet Place 1 tablet (0.4 mg total) under the tongue every 5 (five) minutes x 3 doses as needed for chest pain. 25 tablet 12  . Omega-3 Fatty Acids (FISH OIL) 1200 MG CAPS Take 1,200 mg by mouth daily.     Marland Kitchen OVER THE COUNTER MEDICATION diabazole - 1 capsule by mouth once daily     No current  facility-administered medications for this visit.     Allergies: No Known Allergies  Social History: The patient  reports that he has been smoking Cigarettes and E-cigarettes.  He has a 70.00 pack-year smoking history. He has never used smokeless tobacco. He reports that he drinks alcohol. He reports that he uses drugs, including Cocaine and Marijuana.   Family History: The patient's family history includes Diabetes in his maternal grandmother, mother, and sister; Heart attack in his father.   Review of Systems: Please see the history of present illness.   Otherwise, the review of systems is positive for none.   All other systems are reviewed and negative.   Physical Exam: VS:  BP 112/80   Pulse 76   Ht 5\' 11"  (1.803 m)  Wt 267 lb 12.8 oz (121.5 kg)   SpO2 96%   BMI 37.35 kg/m  .  BMI Body mass index is 37.35 kg/m.  Wt Readings from Last 3 Encounters:  09/09/16 267 lb 12.8 oz (121.5 kg)  09/06/16 271 lb 13.2 oz (123.3 kg)  09/02/16 269 lb (122 kg)    General: Pleasant. Obese. Well developed, well nourished and in no acute distress.   HEENT: Normal.  Neck: Supple, no JVD, carotid bruits, or masses noted.  Cardiac: Regular rate and rhythm. No murmurs, rubs, or gallops. No edema.  Respiratory:  Lungs are clear to auscultation bilaterally with normal work of breathing.  GI: Soft and nontender.  MS: No deformity or atrophy. Gait and ROM intact.  Skin: Warm and dry. Color is normal.  Neuro:  Strength and sensation are intact and no gross focal deficits noted.  Psych: Alert, appropriate and with normal affect. Right wrist cath site ok - minimal bruising. Right groin with moderate amount of bruising. No swelling noted.    LABORATORY DATA:  EKG:  EKG is ordered today. This shows sinus bradycardia with 1st degree AV block. HR is 56.   Lab Results  Component Value Date   WBC 8.5 09/06/2016   HGB 14.3 09/06/2016   HCT 41.7 09/06/2016   PLT 131 (L) 09/06/2016   GLUCOSE 130  (H) 09/06/2016   CHOL 138 08/16/2016   TRIG 288 (H) 08/16/2016   HDL 26 (L) 08/16/2016   LDLCALC 54 08/16/2016   ALT 26 08/16/2016   AST 20 08/16/2016   NA 137 09/06/2016   K 4.5 09/06/2016   CL 102 09/06/2016   CREATININE 1.04 09/06/2016   BUN 18 09/06/2016   CO2 28 09/06/2016   INR 1.04 05/22/2015   HGBA1C 8.4 (H) 11/10/2015    BNP (last 3 results) No results for input(s): BNP in the last 8760 hours.  ProBNP (last 3 results) No results for input(s): PROBNP in the last 8760 hours.   Other Studies Reviewed Today:  09/04/16 Procedures  Coronary [name] Angioplasty  Left Heart Cath and Coronary Angiography  Conclusion    Mid Cx lesion, 25 %stenosed.  Prox RCA lesion, 35 %stenosed.  Mid RCA-1 lesion, 80 %stenosed, in stent restenosis of prior stent. This was treated with scoring [name] angioplasty. Post intervention, there is a 10% residual stenosis.  The left ventricular ejection fraction is 45-50% by visual estimate.  There is mild left ventricular systolic dysfunction.  LV end diastolic pressure is normal.  There is no aortic valve stenosis.  Mid RCA-2 lesion, 90 %stenosed, past the stent. Unable to cross this lesion with a [name].  He needs aggressive secondary prevention. He needs to stop smoking.   Plan for orbital atherectomy tomorrow of this mid RCA lesion.   Of note, he is starting a new job next week and [place] on [place].    09/05/16 Procedures  Coronary Stent Intervention  Coronary Atherectomy  Temporary Pacemaker  Conclusion     Prox RCA stent, 60 %stenosed. Noncompliant [name] angioplasty performed for in-stent restenosis. Post intervention, there is a 5% residual stenosis.  _____________________________  Mid RCA lesion beyond the stent, 85 %stenosed. Tandem calcified lesions  Orbital Atherecomy using CSI Diamondback catheter was performed.  A STENT SYNERGY DES [id number] drug eluting stent  was successfully placed, and overlaps previously placed stent. (post-dilated to 3.8 mm, overlap was dilated to 4.1 mm)  Post intervention, there is a 0% residual stenosis.  Successful orbital atherectomy based PCI of the distal portion of the mid RCA overlapping the more proximal stent with another Synergy DES stent. This was preceded by repeat [name] angioplasty of the more proximal stent using a high pressure noncompliant [name].  Plan:  Patient will return to [facility] postprocedure unit for sheath removal  Continue dual independent therapy  Smoking cessation counseling  Anticipate discharge tomorrow    Echo Study Conclusions from 08/30/2016  - Left ventricle: The cavity size was normal. Wall thickness was   normal. Systolic function was mildly to moderately reduced. The   estimated ejection fraction was in the range of 40% to 45%.   Diffuse hypokinesis.   Myoview Study Highlights from 08/30/2016     There was no ST segment deviation noted during stress.  Defect 1: There is a medium defect of moderate severity present in the mid inferior, apical inferior and apex location.  Findings consistent with inferior ischemia.  This is an intermediate risk study. Non-gated study.   Donato Schultz, MD     Assessment/Plan:  1. CAD:  Prior abnormal stress test on 08/30/16 c/w inferior ischemia. Set up for LHC on 09/04/16 which showed 80% mRCA ISR s/p scoring [name] angioplasty, there was also 90% mRCA stenosis just past the stent but Dr. [name] was unable to cross the lesion with a [name]. He underwent staged orbital atherectomy and DES placement (overlapping previous stent) tomid RCA lesion on 09/05/16.  -- Will plan for triple therapy with ASA, plavix and Eliquis x1 month and then drop ASA. 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 Discussed with Dr. Tenny Craw - Not clear to me if he will need repeat  stress testing. His form has not been completed by me today. Will give him a copy of our note to send to DOT for further disposition.   2. HTN: BP ok on current regimen.   3. LV dysfunction/Ischemic CM:no s/s CHF. Continue BB and ACE. EF was 45 to 50% by most recent cath and 40 to 45% by echo.   4. HLD: continue statin therapy.   5. DMT2: A1C has improved by recent labs noted by PCP  6. PAF: maintaining NSR. He remains on Eliquis.  CHADSVSAC at least 4 (CHF, HTN, DM, vasc dz)  7. Tobacco abuse: counseled on cessation. He is vaping. I have encouraged total cessation.   8. Obesity   Current medicines are reviewed with the patient today.  The patient does not have concerns regarding medicines other than what has been noted above.  The following changes have been made:  See above.  Labs/ tests ordered today include:    Orders Placed This Encounter  Procedures  . Basic metabolic panel  . CBC     Disposition:   FU with Dr. [name] and her team in 3 months. Labs in one month.   Patient is agreeable to this plan and will call if any problems develop in the interim.   SignedNorma Fredrickson, NP  09/09/2016 12:20 PM  [facility] [address] [place], [place]  [address] Phone: [phone] Fax: [phone]

## 2016-09-09 NOTE — Patient Instructions (Addendum)
We will be checking the following labs today -    BMET and CBC in one month to follow up on your medicines  Medication Instructions:    Continue with your current medicines. BUT  STOP Aspirin after one month - then stay on just Plavix and Eliquis    Testing/Procedures To Be Arranged:  N/A  Follow-Up:   See Dr. Delton SeeNelson in 3 months.     Other Special Instructions:   Total smoking cessation encouraged  Walking with a goal of up to 45 minutes a day    If you need a refill on your cardiac medications before your next appointment, please call your pharmacy.   Call the [facility] office at [phone] if you have any questions, problems or concerns.

## 2016-09-09 NOTE — Telephone Encounter (Signed)
Faxing to cath lab for Dr. Delton SeeNelson @ [phone] for clearance on pt's DOT paperwork.  Than will fax to [employer] # [phone] and fax to [phone] after Dr. Delton SeeNelson faxes back to office.

## 2016-09-10 ENCOUNTER — Telehealth: Payer: Self-pay | Admitting: Cardiology

## 2016-09-10 ENCOUNTER — Telehealth: Payer: Self-pay | Admitting: *Deleted

## 2016-09-10 NOTE — Telephone Encounter (Signed)
lvm faxed paperwork to [employer] this am.  I have the confirmation.

## 2016-09-10 NOTE — Telephone Encounter (Signed)
Faxed to [employer] @ [phone] DOT paperwork.  Phone # is [phone] that Dr. Delton SeeNelson signed.

## 2016-09-10 NOTE — Telephone Encounter (Addendum)
New message      Pt states that [employer] did not get the DOT form. Please refax it to [phone].  I think it was faxed to the phone number----according to pt.

## 2016-09-11 NOTE — Telephone Encounter (Signed)
S/w pt has to pick up [employer] paperwork. Tried faxing 6 times will not go through. Will leave at front desk.

## 2016-09-12 ENCOUNTER — Telehealth: Payer: Self-pay | Admitting: Cardiology

## 2016-09-12 NOTE — Telephone Encounter (Signed)
Will route this request to appropriate party to address in medical records.  Medical records to follow-up with the pt accordingly.

## 2016-09-12 NOTE — Telephone Encounter (Signed)
Spoke with patient he is aware I cannot fax [employer] DOT paperwork to a Private Fax- But I will call [employer] and verify a fax and fax this paperwork over.

## 2016-09-12 NOTE — Telephone Encounter (Signed)
New Message    Please fax the [employer] paperwork to fax: [phone] , this number goes to private fax   Please fax to this number ASAP

## 2016-09-12 NOTE — Telephone Encounter (Signed)
Danielle do you know anything about this?  Not sure where the form is located but will be glad to help out.  Thanks so much.

## 2016-09-12 NOTE — Telephone Encounter (Signed)
Spoke with [facility] Front Desk They confirmed their fax machine is currently not working someone should be there today to fix it. She stated to call back tomorrow and see if it up and running.   Called patient back he is aware I cannot fax because fax is down and he will come pick paper work up. Placed at front desk for pick up.

## 2016-09-17 ENCOUNTER — Ambulatory Visit: Payer: BLUE CROSS/BLUE SHIELD | Admitting: Cardiology

## 2016-09-24 ENCOUNTER — Telehealth: Payer: Self-pay | Admitting: Cardiology

## 2016-09-24 NOTE — Telephone Encounter (Signed)
Dr Delton SeeNelson, this pt is having an issue with affording Eliquis on a monthly basis.  Samples have been provided by our refill dept since May 2017.  We applied for pt assistance for Eliquis for this pt, and unfortunately his income is over the limit. Pt would like for you to advise on what he should do in regards to his Eliquis or changing this to another regimen.

## 2016-09-24 NOTE — Telephone Encounter (Signed)
Spoke with patient and made him aware that I could leave one week of samples at the front desk. He stated that he does not qualify for patient assistance as his income is over the limit. His insurance will not start for a few months. He stated that Dr Delton SeeNelson told him that there was nothing else more affordable that he could take. Patient is aware that samples are for new starts and that we are not able to provide them on a monthly basis. He is aware that I will route a message for advise. Please advise. Thanks, MI

## 2016-09-24 NOTE — Telephone Encounter (Signed)
Patient calling the office for samples of medication:   1.  What medication and dosage are you requesting samples for?Eliquis  2.  Are you currently out of this medication? No,he have enough until Sunday-pt waiting for his new insurance to kick in

## 2016-09-25 NOTE — Telephone Encounter (Signed)
Aundra MilletMegan, would you be able to check if there is an alternative? Would Xarelto be cheaper? Thank you, Aris LotKatarina

## 2016-09-26 NOTE — Telephone Encounter (Signed)
We have a Film[employer]video [employer] card scanned in from January 2018. Is this the patient's current insurance? If so, copay cards should make either Xarelto or Eliquis affordable. If he has switched to a Medicare plan since then, the monthly copay for either Xarelto or Eliquis ranges from $35-50 per month after pt has met his deductible.

## 2016-09-26 NOTE — Telephone Encounter (Signed)
Ian [name], RPH routed conversation to Nuala Alpha, LPN 1 hour ago (3:50 AM)    Ian [name], RPH 1 hour ago (8:49 AM)      The commercial copay card will work once his new insurance kicks in. In the mean time, would have him pick up the 30 day free copay card and sample him for the second month until he can afford Eliquis on his new plan.      Documentation     Nuala Alpha, LPN  Ian [name], RPH 1 hour ago (8:39 AM)    Review  (Routing comment)     Nuala Alpha, LPN 2 hours ago (0:93 AM)      Ian [name], I spoke with the pt and he is currently in between jobs and his new insurance plan will not kick in for another 60 days.  His BCBS plan is not valid due to starting a new job.  Pt states he only has enough Eliquis to last him until this Sunday.  Refill team will need to provide him with these samples, if you and Dr [name] suggest he stay on this current regimen.  Please advise.   Thanks!       Documentation     Ian [name], [place] routed conversation to [name], MD; Nuala Alpha, LPN 3 hours ago (8:18 AM)    Ian [name], [employer] 3 hours ago (7:28 AM)      We have a BCBS commercial insurance card scanned in from January 2018. Is this the patient's current insurance? If so, copay cards should make either Xarelto or Eliquis affordable. If he has switched to a Medicare plan since then, the monthly copay for either Xarelto or Eliquis ranges from $35-50 per month after pt has met his deductible.       Documentation     [name], MD  Ian [name], [facility]; Nuala Alpha, LPN Yesterday (2:99 AM)      Ian [name], would you be able to check if there is an alternative? Would Xarelto be cheaper? Thank you, [place] Siren      Documentation     Nuala Alpha, LPN  [name], MD 2 days ago    Review and advise (Routing comment)     Nuala Alpha, LPN 2 days ago      Dr [name], this pt is having an issue with affording Eliquis on a monthly basis.  Samples  have been provided by our refill dept since May 2017.  We applied for pt assistance for Eliquis for this pt, and unfortunately his income is over the limit. Pt would like for you to advise on what he should do in regards to his Eliquis or changing this to another regimen.       Documentation     [name], CMA routed conversation to Nuala Alpha, LPN 2 days ago    [name], CMA 2 days ago      Spoke with patient and made him aware that I could leave one week of samples at the front desk. He stated that he does not qualify for patient assistance as his income is over the limit. His insurance will not start for a few months. He stated that Dr [name] told him that there was nothing else more affordable that he could take. Patient is aware that samples are for new starts and that we are not able to provide  them on a monthly basis. He is aware that I will route a message for advise. Please advise. Thanks, MI      Documentation     [name] routed conversation to [employer] Refill 2 days ago    [name] 2 days ago      Patient calling the office for samples of medication:   1. What medication and dosage are you requesting samples for?Eliquis  2. Are you currentlyout of this medication? No,he have enough until Sunday-pt waiting for his new insurance to kick in        [place] [phone]  [name]    Per suggestion above we provided 60 days of medication, after insurance takes affect pt will have to buy medication or check into other alternatives as we can provide samples after this round.

## 2016-09-26 NOTE — Telephone Encounter (Signed)
The commercial copay card will work once his new insurance kicks in. In the mean time, would have him pick up the 30 day free copay card and sample him for the second month until he can afford Eliquis on his new plan.

## 2016-09-26 NOTE — Telephone Encounter (Signed)
Megan, I spoke with the pt and he is currently in between jobs and his new insurance plan will not kick in for another 60 days.  His BCBS plan is not valid due to starting a new job.  Pt states he only has enough Eliquis to last him until this Sunday.  Refill team will need to provide him with these samples, if you and Dr Delton SeeNelson suggest he stay on this current regimen.  Please advise.   Thanks!

## 2016-09-27 ENCOUNTER — Telehealth: Payer: Self-pay | Admitting: Cardiology

## 2016-09-27 NOTE — Telephone Encounter (Signed)
Pt advised 4 weeks samples of Eliquis, 30 day free card, and Eliquis co-pay card are at front desk for pt to pick up.

## 2016-09-27 NOTE — Telephone Encounter (Signed)
Follow Up   Pt calling to follow up about previous conversation with the nurse. Requesting call back

## 2016-10-02 ENCOUNTER — Telehealth: Payer: Self-pay | Admitting: *Deleted

## 2016-10-02 ENCOUNTER — Ambulatory Visit: Payer: BLUE CROSS/BLUE SHIELD | Admitting: Cardiology

## 2016-10-02 DIAGNOSIS — Z9861 Coronary angioplasty status: Secondary | ICD-10-CM

## 2016-10-02 DIAGNOSIS — I480 Paroxysmal atrial fibrillation: Secondary | ICD-10-CM

## 2016-10-02 NOTE — Telephone Encounter (Signed)
New order entry placed for pt to have a bmet and cbc w diff as ordered by Norma FredricksonLori Gerhardt NP on 09/09/16.  This needed to be re-entered for the pt rescheduled his lab appt to a later date, and current lab orders placed were expired.

## 2016-10-07 ENCOUNTER — Other Ambulatory Visit: Payer: BLUE CROSS/BLUE SHIELD

## 2016-10-31 ENCOUNTER — Other Ambulatory Visit: Payer: Self-pay | Admitting: Nurse Practitioner

## 2016-10-31 MED ORDER — CLOPIDOGREL BISULFATE 75 MG PO TABS: 75.0000 mg | ORAL_TABLET | Freq: Every day | ORAL | 2 refills | Status: DC

## 2016-11-06 ENCOUNTER — Encounter: Payer: Self-pay | Admitting: Cardiology

## 2016-11-06 ENCOUNTER — Other Ambulatory Visit: Payer: Self-pay | Admitting: *Deleted

## 2016-11-06 NOTE — Telephone Encounter (Signed)
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 HAS REFILLS ON FILE.

## 2016-11-18 ENCOUNTER — Other Ambulatory Visit: Payer: BLUE CROSS/BLUE SHIELD

## 2016-11-20 ENCOUNTER — Ambulatory Visit: Payer: BLUE CROSS/BLUE SHIELD | Admitting: Cardiology

## 2016-12-26 ENCOUNTER — Telehealth: Payer: Self-pay | Admitting: Cardiology

## 2016-12-26 MED ORDER — APIXABAN 5 MG PO TABS: 5.0000 mg | ORAL_TABLET | Freq: Two times a day (BID) | ORAL | 3 refills | Status: DC

## 2016-12-26 NOTE — Telephone Encounter (Signed)
New message   *STAT* If patient is at the pharmacy, call can be transferred to refill team.   1. Which medications need to be refilled? (please list name of each medication and dose if known) Eliquis 5mg    2. Which pharmacy/location (including street and city if local pharmacy) is medication to be sent to?  [facility] [place]  3. Do they need a 30 day or 90 day supply? 90

## 2016-12-27 ENCOUNTER — Telehealth: Payer: Self-pay | Admitting: Cardiology

## 2016-12-27 DIAGNOSIS — I480 Paroxysmal atrial fibrillation: Secondary | ICD-10-CM

## 2016-12-27 DIAGNOSIS — I214 Non-ST elevation (NSTEMI) myocardial infarction: Secondary | ICD-10-CM

## 2016-12-27 DIAGNOSIS — E785 Hyperlipidemia, unspecified: Secondary | ICD-10-CM

## 2016-12-27 MED ORDER — FUROSEMIDE 20 MG PO TABS: 20.0000 mg | ORAL_TABLET | Freq: Every day | ORAL | 2 refills | Status: DC

## 2016-12-27 NOTE — Telephone Encounter (Signed)
New Message      *STAT* If patient is at the pharmacy, call can be transferred to refill team.   1. Which medications need to be refilled? (please list name of each medication and dose if known)   furosemide (LASIX) 20 MG tablet Take 1 tablet (20 mg total) by mouth daily.     2. Which pharmacy/location (including street and city if local pharmacy) is medication to be sent to? [place]  3. Do they need a 30 day or 90 day supply?  90

## 2017-02-28 ENCOUNTER — Encounter: Payer: Self-pay | Admitting: Cardiology

## 2017-02-28 ENCOUNTER — Other Ambulatory Visit: Payer: 59 | Admitting: *Deleted

## 2017-02-28 ENCOUNTER — Ambulatory Visit (INDEPENDENT_AMBULATORY_CARE_PROVIDER_SITE_OTHER): Payer: 59 | Admitting: Cardiology

## 2017-02-28 VITALS — BP 124/88 | HR 70 | Ht 71.0 in | Wt 278.0 lb

## 2017-02-28 DIAGNOSIS — I480 Paroxysmal atrial fibrillation: Secondary | ICD-10-CM

## 2017-02-28 DIAGNOSIS — Z9861 Coronary angioplasty status: Secondary | ICD-10-CM

## 2017-02-28 DIAGNOSIS — Z79899 Other long term (current) drug therapy: Secondary | ICD-10-CM

## 2017-02-28 DIAGNOSIS — E785 Hyperlipidemia, unspecified: Secondary | ICD-10-CM

## 2017-02-28 DIAGNOSIS — I2510 Atherosclerotic heart disease of native coronary artery without angina pectoris: Secondary | ICD-10-CM

## 2017-02-28 LAB — BASIC METABOLIC PANEL
BUN/Creatinine Ratio: 19 (ref 9–20)
BUN: 17 mg/dL (ref 6–24)
CO2: 23 mmol/L (ref 20–29)
Calcium: 9.9 mg/dL (ref 8.7–10.2)
Chloride: 100 mmol/L (ref 96–106)
Creatinine, Ser: 0.88 mg/dL (ref 0.76–1.27)
GFR calc Af Amer: 117 mL/min/{1.73_m2} (ref >59)
GFR calc non Af Amer: 102 mL/min/{1.73_m2} (ref >59)
Glucose: 166 mg/dL — ABNORMAL HIGH (ref 65–99)
Potassium: 4.9 mmol/L (ref 3.5–5.2)
Sodium: 141 mmol/L (ref 134–144)

## 2017-02-28 LAB — CBC WITH DIFFERENTIAL/PLATELET
Basophils Absolute: 0.1 10*3/uL (ref 0.0–0.2)
Basos: 1 %
EOS (ABSOLUTE): 0.2 10*3/uL (ref 0.0–0.4)
Eos: 2 %
Hematocrit: 47.7 % (ref 37.5–51.0)
Hemoglobin: 16.9 g/dL (ref 13.0–17.7)
Immature Grans (Abs): 0 10*3/uL (ref 0.0–0.1)
Immature Granulocytes: 0 %
Lymphocytes Absolute: 2.9 10*3/uL (ref 0.7–3.1)
Lymphs: 30 %
MCH: 34.2 pg — ABNORMAL HIGH (ref 26.6–33.0)
MCHC: 35.4 g/dL (ref 31.5–35.7)
MCV: 97 fL (ref 79–97)
Monocytes Absolute: 0.6 10*3/uL (ref 0.1–0.9)
Monocytes: 6 %
Neutrophils Absolute: 5.9 10*3/uL (ref 1.4–7.0)
Neutrophils: 61 %
Platelets: 147 10*3/uL — ABNORMAL LOW (ref 150–379)
RBC: 4.94 x10E6/uL (ref 4.14–5.80)
RDW: 13.7 % (ref 12.3–15.4)
WBC: 9.6 10*3/uL (ref 3.4–10.8)

## 2017-02-28 LAB — HEPATIC FUNCTION PANEL
ALT: 31 IU/L (ref 0–44)
AST: 22 IU/L (ref 0–40)
Albumin: 4.6 g/dL (ref 3.5–5.5)
Alkaline Phosphatase: 93 IU/L (ref 39–117)
BILIRUBIN TOTAL: 0.6 mg/dL (ref 0.0–1.2)
Bilirubin, Direct: 0.15 mg/dL (ref 0.00–0.40)
Total Protein: 6.9 g/dL (ref 6.0–8.5)

## 2017-02-28 LAB — LIPID PANEL
CHOL/HDL RATIO: 4.7 ratio (ref 0.0–5.0)
Cholesterol, Total: 135 mg/dL (ref 100–199)
HDL: 29 mg/dL — ABNORMAL LOW (ref >39)
LDL CALC: 55 mg/dL (ref 0–99)
TRIGLYCERIDES: 253 mg/dL — AB (ref 0–149)
VLDL Cholesterol Cal: 51 mg/dL — ABNORMAL HIGH (ref 5–40)

## 2017-02-28 MED ORDER — CLOPIDOGREL BISULFATE 75 MG PO TABS: 75.0000 mg | ORAL_TABLET | Freq: Every day | ORAL | 2 refills | Status: DC

## 2017-02-28 MED ORDER — FUROSEMIDE 20 MG PO TABS: 20.0000 mg | ORAL_TABLET | Freq: Every day | ORAL | 2 refills | Status: DC

## 2017-02-28 MED ORDER — METOPROLOL TARTRATE 25 MG PO TABS: 12.5000 mg | ORAL_TABLET | Freq: Two times a day (BID) | ORAL | 2 refills | Status: DC

## 2017-02-28 MED ORDER — DILTIAZEM HCL ER COATED BEADS 120 MG PO CP24: 120.0000 mg | ORAL_CAPSULE | Freq: Every day | ORAL | 2 refills | Status: DC

## 2017-02-28 MED ORDER — APIXABAN 5 MG PO TABS: 5.0000 mg | ORAL_TABLET | Freq: Two times a day (BID) | ORAL | 2 refills | Status: DC

## 2017-02-28 MED ORDER — ATORVASTATIN CALCIUM 80 MG PO TABS: 80.0000 mg | ORAL_TABLET | Freq: Every day | ORAL | 2 refills | Status: DC

## 2017-02-28 MED ORDER — LISINOPRIL 2.5 MG PO TABS: 2.5000 mg | ORAL_TABLET | Freq: Every day | ORAL | 2 refills | Status: DC

## 2017-02-28 NOTE — Progress Notes (Signed)
02/28/2017 Ian Ian [name]   1970/11/27  [id number]  Primary Physician Ian March, MD Primary Cardiologist: Dr. Delton [name]  Reason for Visit/CC: F/u for CAD  HPI:  The patient is a 46 y/o moderately obese male, followed by Dr. Delton [name], with a h/o CAD, PAF, chronic anticoagulation, T2DM, HTN, HLD and tobacco abuse (smokes 1 ppd). He works as a Naval architect. In 2016, he presented with a NSTEMI + atrial fibrillation w/ RVR. LHC demonstrated 90% RCA disease, treated with PCI + DES. There was mild residual 25% mid circumflex lesion. EF was 45-50%. He spontaneously converted to NSR but was placed on anticoagulation with Eliquis given a CHA2DS2 VASc score > 2. He was on tripple therapy with ASA, Plavix and Eliquis x 30 days. ASA was then discontinued.  In Jan. 2018, he presented for his annual DOT physical and complained of left chest and shoulder pain. He was set up for a NST. This returned abnormal for possible inferior ischemia and he was set up for [facility] on 09/04/16. This showed an 80% mRCA ISR s/p scoring Ian [name] angioplasty. There was also 90% mRCA stenosis just past the stent, but Dr. Eldridge [name] was unable to cross the lesion with a Ian [name]. He underwent staged orbital atherectomy and DES placement (overlapping previous stent) to mid RCA lesion on 09/05/16. Once again, he was on triple therapy x 1 month, then ASA discontinued. His EF was also noted to be mildly reduced at that time at 40-45%.   He presents back for 6 month f/u with his wife. He has no complaints. No recurrent chest or shoulder pain. No dyspnea or exertional symptoms. He also denies orthopnea, PND, LEE, palpitations, dizziness, syncope/ near syncope. He reports full med compliance. No abnormal bleeding with Eliquis and Plavix. He continues to smoke but notes that he has cut back from 2ppd>>1ppd. His PCP started him on Wellbutrin. He still works as a Naval architect.  Current Meds  Medication Sig  . apixaban (ELIQUIS) 5 MG TABS tablet Take  1 tablet (5 mg total) by mouth 2 (two) times daily.  Marland Kitchen aspirin EC 81 MG tablet Take 81 mg by mouth daily.  Marland Kitchen atorvastatin (LIPITOR) 80 MG tablet Take 1 tablet (80 mg total) by mouth daily at 6 PM.  . clopidogrel (PLAVIX) 75 MG tablet Take 1 tablet (75 mg total) by mouth daily.  Marland Kitchen diltiazem (CARDIZEM CD) 120 MG 24 hr capsule Take 1 capsule (120 mg total) by mouth daily.  . furosemide (LASIX) 20 MG tablet Take 1 tablet (20 mg total) by mouth daily.  Marland Kitchen glipiZIDE (GLUCOTROL) 5 MG tablet Take 5 mg by mouth daily before breakfast.   . lisinopril (PRINIVIL,ZESTRIL) 2.5 MG tablet Take 1 tablet (2.5 mg total) by mouth daily.  . metFORMIN (GLUCOPHAGE) 500 MG tablet Take 1 tablet (500 mg total) by mouth 2 (two) times daily with a meal.  . metoprolol tartrate (LOPRESSOR) 25 MG tablet Take 0.5 tablets (12.5 mg total) by mouth 2 (two) times daily.  . Multiple Vitamin (MULTIVITAMIN) tablet Take 1 tablet by mouth daily.  . nitroGLYCERIN (NITROSTAT) 0.4 MG SL tablet Place 1 tablet (0.4 mg total) under the tongue every 5 (five) minutes x 3 doses as needed for chest pain.  . Omega-3 Fatty Acids (FISH OIL) 1200 MG CAPS Take 1,200 mg by mouth daily.   . [DISCONTINUED] apixaban (ELIQUIS) 5 MG TABS tablet Take 1 tablet (5 mg total) by mouth 2 (two) times daily.  . [DISCONTINUED] atorvastatin (LIPITOR) 80 MG tablet Take 1  tablet (80 mg total) by mouth daily at 6 PM. (Patient taking differently: Take 80 mg by mouth every evening. )  . [DISCONTINUED] clopidogrel (PLAVIX) 75 MG tablet Take 1 tablet (75 mg total) by mouth daily.  . [DISCONTINUED] diltiazem (CARDIZEM CD) 120 MG 24 hr capsule Take 1 capsule (120 mg total) by mouth daily.  . [DISCONTINUED] furosemide (LASIX) 20 MG tablet Take 1 tablet (20 mg total) by mouth daily.  . [DISCONTINUED] lisinopril (PRINIVIL,ZESTRIL) 2.5 MG tablet Take 1 tablet (2.5 mg total) by mouth daily.  . [DISCONTINUED] metoprolol tartrate (LOPRESSOR) 25 MG tablet Take 0.5 tablets (12.5 mg  total) by mouth 2 (two) times daily.  . [DISCONTINUED] OVER THE COUNTER MEDICATION diabazole - 1 capsule by mouth once daily   No Known Allergies Past Medical History:  Diagnosis Date  . Arthritis    "left ankle" (09/04/2016)  . Complication of anesthesia    "hard to get me out of it sometimes; usually takes me longer to get under"  . Coronary artery disease   . History of palpitations   . HLD (hyperlipidemia)   . Hypertension   . NSTEMI (non-ST elevated myocardial infarction) (HCC) 05/2015   Ian Ian [name] 05/23/2015 "minor one"  . PAF (paroxysmal atrial fibrillation) (HCC)   . Type II diabetes mellitus (HCC)    Family History  Problem Relation Age of Onset  . Diabetes Mother   . Heart attack Father   . Diabetes Sister   . Diabetes Maternal Grandmother    Past Surgical History:  Procedure Laterality Date  . CARDIAC CATHETERIZATION  2000, 2005   "less than 20% blockage"  . CARDIAC CATHETERIZATION N/A 05/22/2015   Procedure: Left Heart Cath and Coronary Angiography;  Surgeon: [name], MD;  Location: [facility] INVASIVE CV LAB;  Service: Cardiovascular;  Laterality: N/A;  . CARDIAC CATHETERIZATION N/A 05/22/2015   Procedure: Coronary Stent Intervention;  Surgeon: [name], MD;  Location: [facility] INVASIVE CV LAB;  Service: Cardiovascular;  Laterality: N/A;  . CARDIAC CATHETERIZATION N/A 09/04/2016   Procedure: Left Heart Cath and Coronary Angiography;  Surgeon: [name], MD;  Location: [facility] INVASIVE CV LAB;  Service: Cardiovascular;  Laterality: N/A;  . CARDIAC CATHETERIZATION N/A 09/04/2016   Procedure: Coronary Ian [name] Angioplasty;  Surgeon: [name], MD;  Location: [facility] INVASIVE CV LAB;  Service: Cardiovascular;  Laterality: N/A;  . CARDIAC CATHETERIZATION N/A 09/05/2016   Procedure: Coronary Atherectomy;  Surgeon: Marykay Lex, MD;  Location: [facility] INVASIVE CV LAB;  Service: Cardiovascular;  Laterality: N/A;  . CARDIAC CATHETERIZATION N/A 09/05/2016    Procedure: Coronary Stent Intervention;  Surgeon: Marykay Lex, MD;  Location: [facility] INVASIVE CV LAB;  Service: Cardiovascular;  Laterality: N/A;  . CARDIAC CATHETERIZATION N/A 09/05/2016   Procedure: Temporary Pacemaker;  Surgeon: Marykay Lex, MD;  Location: [facility] INVASIVE CV LAB;  Service: Cardiovascular;  Laterality: N/A;  . CORONARY ANGIOPLASTY    . MULTIPLE TOOTH EXTRACTIONS  2013   "all of them"  . TYMPANOSTOMY TUBE PLACEMENT Bilateral 1970s   Social History   Social History  . Marital status: Married    Spouse name: N/A  . Number of children: N/A  . Years of education: N/A   Occupational History  . Not on file.   Social History Main Topics  . Smoking status: Current Every Day Smoker    Packs/day: 2.00    Years: 35.00    Types: Cigarettes, E-cigarettes  . Smokeless tobacco: Never Used  . Alcohol use 0.0 oz/week  Comment: 09/04/2016  "might have a few drinks/year; nothing since 05/2015"  . Drug use: Yes    Types: Cocaine, Marijuana     Comment: 09/04/2016 "haven't touched drugs since 1988"  . Sexual activity: Yes   Other Topics Concern  . 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     Review of Systems: General: negative for chills, fever, night sweats or weight changes.  Cardiovascular: negative for chest pain, dyspnea on exertion, edema, orthopnea, palpitations, paroxysmal nocturnal dyspnea or shortness of breath Dermatological: negative for rash Respiratory: negative for cough or wheezing Urologic: negative for hematuria Abdominal: negative for nausea, vomiting, diarrhea, bright red blood per rectum, melena, or hematemesis Neurologic: negative for visual changes, syncope, or dizziness All other systems reviewed and are otherwise negative except as noted above.   Physical Exam:  Blood pressure 124/88, pulse 70, height 5\' 11"  (1.803 m), weight 278 lb (126.1 kg).  General appearance: alert, cooperative,  no distress and moderately obese Neck: no carotid bruit and no JVD Lungs: clear to auscultation bilaterally Heart: regular rate and rhythm, S1, S2 normal, no murmur, click, rub or gallop Extremities: extremities normal, atraumatic, no cyanosis or edema Pulses: 2+ and symmetric Skin: Skin color, texture, turgor normal. No rashes or lesions Neurologic: Grossly normal  EKG not performed -- personally reviewed   ASSESSMENT AND PLAN:   1. CAD: s/p NSTEMI in 2016. Cath showed 90% RCA disease, treated with PCI + DES. Repeat LHC for abnormal nuc 09/2016 w/ 80% mRCA ISR treated with scoring Ian [name] angioplasty. There was also 90% mRCA stenosis just past the stent, requiring staged orbital atherectomy and DES placement (overlapping previous stent) to mid RCA lesion. EF was 40-45%. Stable w/o recurrent angina. Continue medical therapy for secondary prevention.   2. PAF:  Denies breakthrough symptoms. RRR on exam. Rate is controlled on BB + CCB. On Eliquis for a/c. We will continue current regimen. Check CBC today.   3. Cardiomyopathy/Systolic HF: EF 40-45% on most recent echo 09/2016. Stable w/o s/s of acute CHF/ volume overload. Continue BB and ACE-I. Low salt diet advised.   4. HLD: on statin therapy with Lipitor. He is fasting. Will check FLP and HFTs today. Goal LDL is <70 mg/dL.   5. HTN: well controlled on current regimen. Check BMP today to monitor renal function and electrolytes.   6. DM: type 2. On Metformin. Followed by PCP.   7. Tobacco Abuse:  Reduced consumption from 2ppd>>1ppd. We discussed importance of complete smoking cessation.  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 His PCP is currently treating him with Wellbutrin.    Follow-Up with Dr. Delton SeeNelson in 6 months. Refills were provided for all cardiac meds 90 day supply   Tatum Massman Delmer IslamSimmons [name], [facility] 02/28/2017 8:38 AM

## 2017-02-28 NOTE — Patient Instructions (Addendum)
Medication Instructions:      Your physician recommends that you continue on your current medications as directed. Please refer to the Current Medication list given to you today.   If you need a refill on your cardiac medications before your next appointment, please call your pharmacy.  Labwork:  CBC WITH DIFF BMET LIPID AND LIVER    Testing/Procedures: NONE ORDERED  TODAY    Follow-Up: WITH DR [name] IN December    Any Other Special Instructions Will Be Listed Below (If Applicable).                        Coping with Quitting Smoking Quitting smoking is a physical and mental challenge. You will face cravings, withdrawal symptoms, and temptation. Before quitting, work with your health care provider to make a plan that can help you cope. Preparation can help you quit and keep you from giving in. How can I cope with cravings? Cravings usually last for 5-10 minutes. If you get through it, the craving will pass. Consider taking the following actions to help you cope with cravings:  Keep your mouth busy: ? Chew sugar-free gum. ? Suck on hard candies or a straw. ? Brush your teeth.  Keep your hands and body busy: ? Immediately change to a different activity when you feel a craving. ? Squeeze or play with a ball. ? Do an activity or a hobby, like making bead jewelry, practicing needlepoint, or working with wood. ? Mix up your normal routine. ? Take a short exercise break. Go for a quick walk or run up and down stairs. ? Spend time in public places where smoking is not allowed.  Focus on doing something kind or helpful for someone else.  Call a friend or family member to talk during a craving.  Join a support group.    Call a quit line, such as 1-800-QUIT-NOW.  Talk with your health care provider about medicines that might help you cope with cravings and make quitting easier for yCou.  How can I deal with withdrawal symptoms? Your body may experience negative effects as  it tries to get used to not having nicotine in the system. These effects are called withdrawal symptoms. They may include:  Feeling hungrier than normal.  Trouble concentrating.  Irritability.  Trouble sleeping.  Feeling depressed.  Restlessness and agitation.  Craving a cigarette.  To manage withdrawal symptoms:  Avoid places, people, and activities that trigger your cravings.  Remember why you want to quit.  Get plenty of sleep.  Avoid coffee and other caffeinated drinks. These may worsen some of your symptoms.  How can I handle social situations? Social situations can be difficult when you are quitting smoking, especially in the first few weeks. To manage this, you can:  Avoid parties, bars, and other social situations where people might be smoking.  Avoid alcohol.  Leave right away if you have the urge to smoke.  Explain to your family and friends that you are quitting smoking. Ask for understanding and support.  Plan activities with friends or family where smoking is not an option.  What are some ways I can cope with stress? Wanting to smoke may cause stress, and stress can make you want to smoke. Find ways to manage your stress. Relaxation techniques can help. For example:  Breathe slowly and deeply, in through your nose and out through your mouth.  Listen to soothing, relaxing music.  Talk with a family member or  friend about your stress.  Light a candle.  Soak in a bath or take a shower.  Think about a peaceful place.  What are some ways I can prevent weight gain? Be aware that many people gain weight after they quit smoking. However, not everyone does. To keep from gaining weight, have a plan in place before you quit and stick to the plan after you quit. Your plan should include:  Having healthy snacks. When you have a craving, it may help to: ? Eat plain popcorn, crunchy carrots, celery, or other cut vegetables. ? Chew sugar-free gum.  Changing  how you eat: ? Eat small portion sizes at meals. ? Eat 4-6 small meals throughout the day instead of 1-2 large meals a day. ? Be mindful when you eat. Do not watch television or do other things that might distract you as you eat.  Exercising regularly: ? Make time to exercise each day. If you do not have time for a long workout, do short bouts of exercise for 5-10 minutes several times a day. ? Do some form of strengthening exercise, like weight lifting, and some form of aerobic exercise, like running or swimming.  Drinking plenty of water or other low-calorie or no-calorie drinks. Drink 6-8 glasses of water daily, or as much as instructed by your health care provider.  Summary  Quitting smoking is a physical and mental challenge. You will face cravings, withdrawal symptoms, and temptation to smoke again. Preparation can help you as you go through these challenges.  You can cope with cravings by keeping your mouth busy (such as by chewing gum), keeping your body and hands busy, and making calls to family, friends, or a helpline for people who want to quit smoking.  You can cope with withdrawal symptoms by avoiding places where people smoke, avoiding drinks with caffeine, and getting plenty of rest.  Ask your health care provider about the different ways to prevent weight gain, avoid stress, and handle social situations. This information is not intended to replace advice given to you by your health care provider. Make sure you discuss any questions you have with your health care provider. Document Released: 07/26/2016 Document Revised: 07/26/2016 Document Reviewed: 07/26/2016 Elsevier Interactive Patient Education  [employer]2018 Elsevier Inc.

## 2017-03-03 ENCOUNTER — Other Ambulatory Visit: Payer: Self-pay | Admitting: Cardiology

## 2017-03-04 ENCOUNTER — Telehealth: Payer: Self-pay

## 2017-03-04 DIAGNOSIS — E785 Hyperlipidemia, unspecified: Secondary | ICD-10-CM

## 2017-03-04 MED ORDER — FENOFIBRATE 48 MG PO TABS: 48.0000 mg | ORAL_TABLET | Freq: Every day | ORAL | 11 refills | Status: DC

## 2017-03-04 NOTE — Telephone Encounter (Signed)
Medication sent to pharmacy and lab orders placed.

## 2017-03-06 ENCOUNTER — Telehealth: Payer: Self-pay | Admitting: Cardiology

## 2017-03-06 NOTE — Telephone Encounter (Signed)
Pt's wife calling regarding pt needing an Epi-pen and pcp wants an ok from Cardiologist, pls call wife at [phone]

## 2017-03-06 NOTE — Telephone Encounter (Signed)
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  Wife states that the pts PCP called him in an Epi-pen for bee stings, for the pts job comes in close contact with bee's and he is highly allergic to them. Wife wanted to make sure this wasn't contraindicated with his meds.  Spoke with our Pharmacist, and there is no contraindication with the pts cardiac meds and using Epi as needed.  Tresa EndoKelly our Pharmacist advised that if he had to utilize this med, it could cause elevated HR and BP, but he should seek immediate medical attention in the case of utilizing this med for anaphylaxis anyway's.  Informed the pts wife of this.  Informed the pts wife that its safer to use the Epi pen in the case of anaphylaxis, then not having one at all.  Advised the pts wife that he should always call 911, when he has to utilize this med.  Wife verbalized understanding and agrees with this plan.  Will route this message to Dr Delton SeeNelson as an Lorain ChildesFYI.

## 2017-04-21 ENCOUNTER — Encounter (HOSPITAL_COMMUNITY): Payer: Self-pay | Admitting: Emergency Medicine

## 2017-04-21 ENCOUNTER — Telehealth: Payer: Self-pay | Admitting: Cardiology

## 2017-04-21 ENCOUNTER — Emergency Department (HOSPITAL_COMMUNITY): Payer: 59

## 2017-04-21 DIAGNOSIS — Z7982 Long term (current) use of aspirin: Secondary | ICD-10-CM | POA: Insufficient documentation

## 2017-04-21 DIAGNOSIS — Z8249 Family history of ischemic heart disease and other diseases of the circulatory system: Secondary | ICD-10-CM | POA: Insufficient documentation

## 2017-04-21 DIAGNOSIS — I2510 Atherosclerotic heart disease of native coronary artery without angina pectoris: Secondary | ICD-10-CM | POA: Insufficient documentation

## 2017-04-21 DIAGNOSIS — Z955 Presence of coronary angioplasty implant and graft: Secondary | ICD-10-CM | POA: Insufficient documentation

## 2017-04-21 DIAGNOSIS — Z7901 Long term (current) use of anticoagulants: Secondary | ICD-10-CM | POA: Insufficient documentation

## 2017-04-21 DIAGNOSIS — I2583 Coronary atherosclerosis due to lipid rich plaque: Secondary | ICD-10-CM | POA: Insufficient documentation

## 2017-04-21 DIAGNOSIS — E1165 Type 2 diabetes mellitus with hyperglycemia: Secondary | ICD-10-CM | POA: Diagnosis not present

## 2017-04-21 DIAGNOSIS — Z7902 Long term (current) use of antithrombotics/antiplatelets: Secondary | ICD-10-CM | POA: Diagnosis not present

## 2017-04-21 DIAGNOSIS — E785 Hyperlipidemia, unspecified: Secondary | ICD-10-CM | POA: Insufficient documentation

## 2017-04-21 DIAGNOSIS — I255 Ischemic cardiomyopathy: Secondary | ICD-10-CM | POA: Diagnosis not present

## 2017-04-21 DIAGNOSIS — F17210 Nicotine dependence, cigarettes, uncomplicated: Secondary | ICD-10-CM | POA: Diagnosis not present

## 2017-04-21 DIAGNOSIS — Z9889 Other specified postprocedural states: Secondary | ICD-10-CM | POA: Diagnosis not present

## 2017-04-21 DIAGNOSIS — I480 Paroxysmal atrial fibrillation: Secondary | ICD-10-CM | POA: Insufficient documentation

## 2017-04-21 DIAGNOSIS — Z6837 Body mass index (BMI) 37.0-37.9, adult: Secondary | ICD-10-CM | POA: Diagnosis not present

## 2017-04-21 DIAGNOSIS — R0789 Other chest pain: Secondary | ICD-10-CM | POA: Diagnosis not present

## 2017-04-21 DIAGNOSIS — Z833 Family history of diabetes mellitus: Secondary | ICD-10-CM | POA: Insufficient documentation

## 2017-04-21 DIAGNOSIS — Z79899 Other long term (current) drug therapy: Secondary | ICD-10-CM | POA: Insufficient documentation

## 2017-04-21 DIAGNOSIS — I252 Old myocardial infarction: Secondary | ICD-10-CM | POA: Insufficient documentation

## 2017-04-21 DIAGNOSIS — R079 Chest pain, unspecified: Secondary | ICD-10-CM | POA: Diagnosis present

## 2017-04-21 DIAGNOSIS — I10 Essential (primary) hypertension: Secondary | ICD-10-CM | POA: Diagnosis not present

## 2017-04-21 LAB — BASIC METABOLIC PANEL
Anion gap: 7 (ref 5–15)
BUN: 12 mg/dL (ref 6–20)
CALCIUM: 9.7 mg/dL (ref 8.9–10.3)
CHLORIDE: 103 mmol/L (ref 101–111)
CO2: 26 mmol/L (ref 22–32)
CREATININE: 1.03 mg/dL (ref 0.61–1.24)
GFR calc Af Amer: >60 mL/min (ref >60)
Glucose, Bld: 171 mg/dL — ABNORMAL HIGH (ref 65–99)
Potassium: 3.9 mmol/L (ref 3.5–5.1)
SODIUM: 136 mmol/L (ref 135–145)

## 2017-04-21 LAB — CBC
HCT: 45.9 % (ref 39.0–52.0)
Hemoglobin: 15.8 g/dL (ref 13.0–17.0)
MCH: 33.4 pg (ref 26.0–34.0)
MCHC: 34.4 g/dL (ref 30.0–36.0)
MCV: 97 fL (ref 78.0–100.0)
Platelets: 145 10*3/uL — ABNORMAL LOW (ref 150–400)
RBC: 4.73 MIL/uL (ref 4.22–5.81)
RDW: 12.3 % (ref 11.5–15.5)
WBC: 9.9 10*3/uL (ref 4.0–10.5)

## 2017-04-21 LAB — I-STAT TROPONIN, ED: TROPONIN I, POC: 0 ng/mL (ref 0.00–0.08)

## 2017-04-21 NOTE — Telephone Encounter (Signed)
New message    Pt wife is calling about pt. He wanted her to call and ask for a call back because he doesn't know if he's having problems. He wants a call back so he can explain his symptoms to the nurse. Please call.

## 2017-04-21 NOTE — ED Triage Notes (Signed)
Pt sts left sided CP x 3 days worse today

## 2017-04-21 NOTE — Telephone Encounter (Signed)
Pt is calling to informing Dr Delton SeeNelson that he is in route, with his wife, to [facility] ER for complaints of chest pain.   Pt states that he feels he needs to go to the Hospital vs going to a MDs office, for complaints of chest pain. Pt states that he is currently in [place], and refuses to go to the Hospital there, for he only wants to come to [facility].  Pt states the he is have worsening mid-sternal chest pain, that radiates down his arm. Pt states its intermittent in nature. Pt states he's not sob, and he has not had any pre-syncopal or syncopal episodes. Pt states he has no N/V.  Pt states that his chest pain has been going on for 3 days now, and its become more frequent in nature.  Pt states "it kind of feels like the last heart attack I had." Advised the pt to take his nitroglycerin, if he has this on hand and is having current chest pain.  Pt education used when advising him how to safely take his Nitro.  Advised the pt that if his symptoms worsen from now and being en route to the hospital, then he should have his wife pull the car over and call 911.  Informed the pt that Dr Delton SeeNelson is out of the office today, but I will make her aware that he is coming to [facility], from [place], with complaints of chest pain.  Pt verbalized understanding and agrees with this plan.  Pt gracious for letting Dr Delton SeeNelson know what's going on.

## 2017-04-22 ENCOUNTER — Other Ambulatory Visit: Payer: Self-pay | Admitting: Cardiology

## 2017-04-22 ENCOUNTER — Encounter (HOSPITAL_COMMUNITY): Payer: Self-pay | Admitting: Family Medicine

## 2017-04-22 ENCOUNTER — Observation Stay (HOSPITAL_COMMUNITY)
Admission: EM | Admit: 2017-04-22 | Discharge: 2017-04-22 | Disposition: A | Discharge status: Home / Self Care | Payer: 59 | Attending: Internal Medicine | Admitting: Internal Medicine

## 2017-04-22 DIAGNOSIS — E118 Type 2 diabetes mellitus with unspecified complications: Secondary | ICD-10-CM | POA: Diagnosis not present

## 2017-04-22 DIAGNOSIS — I10 Essential (primary) hypertension: Secondary | ICD-10-CM | POA: Diagnosis present

## 2017-04-22 DIAGNOSIS — I2510 Atherosclerotic heart disease of native coronary artery without angina pectoris: Secondary | ICD-10-CM | POA: Diagnosis present

## 2017-04-22 DIAGNOSIS — I255 Ischemic cardiomyopathy: Secondary | ICD-10-CM | POA: Diagnosis not present

## 2017-04-22 DIAGNOSIS — R079 Chest pain, unspecified: Secondary | ICD-10-CM

## 2017-04-22 DIAGNOSIS — I480 Paroxysmal atrial fibrillation: Secondary | ICD-10-CM

## 2017-04-22 DIAGNOSIS — I2583 Coronary atherosclerosis due to lipid rich plaque: Secondary | ICD-10-CM | POA: Diagnosis not present

## 2017-04-22 LAB — CBG MONITORING, ED: Glucose-Capillary: 191 mg/dL — ABNORMAL HIGH (ref 65–99)

## 2017-04-22 LAB — TROPONIN I

## 2017-04-22 LAB — HIV ANTIBODY (ROUTINE TESTING W REFLEX): HIV SCREEN 4TH GENERATION: NONREACTIVE

## 2017-04-22 MED ORDER — FUROSEMIDE 20 MG PO TABS
20.0000 mg | ORAL_TABLET | Freq: Every day | ORAL | Status: DC
  Administered 2017-04-22: 20 mg via ORAL
  Filled 2017-04-22: qty 1

## 2017-04-22 MED ORDER — CLOPIDOGREL BISULFATE 75 MG PO TABS
75.0000 mg | ORAL_TABLET | Freq: Every day | ORAL | Status: DC
  Administered 2017-04-22: 75 mg via ORAL
  Filled 2017-04-22: qty 1

## 2017-04-22 MED ORDER — ENOXAPARIN SODIUM 40 MG/0.4ML ~~LOC~~ SOLN: 40.0000 mg | SUBCUTANEOUS | Status: DC

## 2017-04-22 MED ORDER — LISINOPRIL 2.5 MG PO TABS
2.5000 mg | ORAL_TABLET | Freq: Every day | ORAL | Status: DC
  Administered 2017-04-22: 2.5 mg via ORAL
  Filled 2017-04-22: qty 1

## 2017-04-22 MED ORDER — GI COCKTAIL ~~LOC~~: 30.0000 mL | Freq: Four times a day (QID) | ORAL | Status: DC | PRN

## 2017-04-22 MED ORDER — INSULIN ASPART 100 UNIT/ML ~~LOC~~ SOLN: 0.0000 [IU] | SUBCUTANEOUS | Status: DC

## 2017-04-22 MED ORDER — APIXABAN 5 MG PO TABS
5.0000 mg | ORAL_TABLET | Freq: Two times a day (BID) | ORAL | Status: DC
  Administered 2017-04-22: 5 mg via ORAL
  Filled 2017-04-22: qty 1

## 2017-04-22 MED ORDER — ONDANSETRON HCL 4 MG/2ML IJ SOLN: 4.0000 mg | Freq: Four times a day (QID) | INTRAMUSCULAR | Status: DC | PRN

## 2017-04-22 MED ORDER — ACETAMINOPHEN 325 MG PO TABS: 650.0000 mg | ORAL_TABLET | ORAL | Status: DC | PRN

## 2017-04-22 MED ORDER — ATORVASTATIN CALCIUM 80 MG PO TABS
80.0000 mg | ORAL_TABLET | Freq: Every day | ORAL | Status: DC
  Filled 2017-04-22: qty 1

## 2017-04-22 MED ORDER — FENOFIBRATE 54 MG PO TABS
54.0000 mg | ORAL_TABLET | Freq: Every day | ORAL | Status: DC
  Administered 2017-04-22: 54 mg via ORAL
  Filled 2017-04-22: qty 1

## 2017-04-22 NOTE — Progress Notes (Signed)
Per Dr Delton SeeNelson pt is clear to drive for work. Will create letter per her permitting him to do so.

## 2017-04-22 NOTE — Progress Notes (Signed)
Patient admitted after midnight- please see H&P.  No further chest pain- no missed doses.  Cardiology consult-- also paged Dr. Delton SeeNelson as he is her patient.  NPO for cath vs stress test.  Marlin CanaryJessica Davarious Tumbleson DO
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
## 2017-04-22 NOTE — ED Notes (Signed)
Dr. Benjamine MolaVann contacted this RN regarding page about stress test, Dr. Benjamine MolaVann advised that she does not feel stress test is indicated at this time but that Cardiology was supposed to see patient to determine plan, Dr. Benjamine MolaVann aware that Cards has not seen patient yet and advised this RN she was going to contact them at this time.

## 2017-04-22 NOTE — Telephone Encounter (Signed)
Wife was just calling to give an update on the pt.  [facility] was [facility] cardiac [facility].  Wife states the pt is doing well, and they are making the decision to cath him or have him do another stress test.  Wife wanted to give thanks for all the advice and assistance provided yesterday.  Wife states she will keep me in the loop.

## 2017-04-22 NOTE — ED Notes (Signed)
Dr. Maryfrances Bunnellanford aware pt will be refusing all insulin while here
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
## 2017-04-22 NOTE — ED Notes (Signed)
Pt requesting to take home meds that he brought with him, this RN advised patient that his meds will be administered from us and that we would prefer him not take his  meds from home so that we can keep track of what medications he has had and when.

## 2017-04-22 NOTE — Telephone Encounter (Signed)
New Message    Pt wife is calling to give you an update on his status in the hospital

## 2017-04-22 NOTE — Discharge Summary (Signed)
Physician Discharge Summary  Ian BalloonCraig Singleterry ZOX:[id number] DOB: 1970/09/24 DOA: 04/22/2017  PCP: [name], [name], MD  Admit date: 04/22/2017 Discharge date: 04/22/2017   Recommendations for Outpatient Follow-Up:   1. Cardiology follow up outpatient per Dr. Delton SeeNelson   Discharge Diagnosis:   Principal Problem:   Chest pain Active Problems:   Type 2 diabetes mellitus with complication, without long-term current use of insulin (HCC)   PAF (paroxysmal atrial fibrillation) (HCC)   Essential hypertension   Coronary artery disease due to lipid rich plaque   Ischemic cardiomyopathy   Discharge disposition:  Home.  Discharge Condition: Improved.  Diet recommendation: Low sodium, heart healthy.  Carbohydrate-modified  Wound care: None.   History of Present Illness:   Ian [name] is a 46 y.o. male with a past medical history significant for CAD s/p DES in 2016 and again Jan 2018, NIDDM, HTN, acitve smoking and Afib on Eliquis who presents with chest pain.  The patient was in his usual state of health until about three days ago when he started to have intermittent chest tightness.  This was sometimes exertional, sometimes not, was accompanied by exertional fatigue, also felt like "a chest cold" but without other URI symptoms, and also felt very similar to his previous angina in 2016 only not as severe, "like a warning sign".     Hospital Course by Problem:   Chest pain -CE negative -outpatient treadmill nuclear stress test arrange  Tobacco abuse -encourage cessation    Medical Consultants:   cards  Discharge Exam:   Vitals:   04/22/17 1303 04/22/17 1551  BP: (!) 145/66 (!) 156/98  Pulse: (!) 101 80  Resp: 14 18  Temp:    SpO2: 96% 99%   Vitals:   04/22/17 1100 04/22/17 1200 04/22/17 1303 04/22/17 1551  BP: (!) 112/98 122/82 (!) 145/66 (!) 156/98  Pulse: 98 90 (!) 101 80  Resp: 18 16 14 18   Temp:      TempSrc:      SpO2: 99% 95% 96% 99%  Height:    5'  11" (1.803 m)    Gen:  NAD- anxious to go home  The results of significant diagnostics from this hospitalization (including imaging, microbiology, ancillary and laboratory) are listed below for reference.     Procedures and Diagnostic Studies:   No results found.   Labs:   Basic Metabolic Panel:  Recent Labs Lab 04/21/17 1833  NA 136  K 3.9  CL 103  CO2 26  GLUCOSE 171*  BUN 12  CREATININE 1.03  CALCIUM 9.7   GFR CrCl cannot be calculated (Unknown ideal weight.). Liver Function Tests: No results for input(s): AST, ALT, ALKPHOS, BILITOT, PROT, ALBUMIN in the last 168 hours. No results for input(s): LIPASE, AMYLASE in the last 168 hours. No results for input(s): AMMONIA in the last 168 hours. Coagulation profile No results for input(s): INR, PROTIME in the last 168 hours.  CBC:  Recent Labs Lab 04/21/17 1833  WBC 9.9  HGB 15.8  HCT 45.9  MCV 97.0  PLT 145*   Cardiac Enzymes:  Recent Labs Lab 04/22/17 0138 04/22/17 0431  TROPONINI <0.03 <0.03   BNP: Invalid input(s): POCBNP CBG:  Recent Labs Lab 04/22/17 0436  GLUCAP 191*   D-Dimer No results for input(s): DDIMER in the last 72 hours. Hgb A1c No results for input(s): HGBA1C in the last 72 hours. Lipid Profile No results for input(s): CHOL, HDL, LDLCALC, TRIG, CHOLHDL, LDLDIRECT in the last 72 hours. Thyroid function studies  No results for input(s): TSH, T4TOTAL, T3FREE, THYROIDAB in the last 72 hours.  Invalid input(s): FREET3 Anemia work up No results for input(s): VITAMINB12, FOLATE, FERRITIN, TIBC, IRON, RETICCTPCT in the last 72 hours. Microbiology No results found for this or any previous visit (from the past 240 hour(s)).   Discharge Instructions:   Discharge Instructions    Diet - low sodium heart healthy    Complete by:  As directed    Diet Carb Modified    Complete by:  As directed    Discharge instructions    Complete by:  As directed    Outpatient stress test    Increase activity slowly    Complete by:  As directed      Allergies as of 04/22/2017   No Known Allergies     Medication List    TAKE these medications   apixaban 5 MG Tabs tablet Commonly known as:  ELIQUIS Take 1 tablet (5 mg total) by mouth 2 (two) times daily.   atorvastatin 80 MG tablet Commonly known as:  LIPITOR Take 1 tablet (80 mg total) by mouth daily at 6 PM.   clopidogrel 75 MG tablet Commonly known as:  PLAVIX Take 1 tablet (75 mg total) by mouth daily.   diltiazem 120 MG 24 hr capsule Commonly known as:  CARDIZEM CD Take 1 capsule (120 mg total) by mouth daily.   fenofibrate 48 MG tablet Commonly known as:  TRICOR Take 1 tablet (48 mg total) by mouth daily.   Fish Oil 1200 MG Caps Take 1,200 mg by mouth daily.   furosemide 20 MG tablet Commonly known as:  LASIX Take 1 tablet (20 mg total) by mouth daily.   glipiZIDE 5 MG tablet Commonly known as:  GLUCOTROL Take 5-10 mg by mouth See admin instructions.  in the am and  in the evening   lisinopril 2.5 MG tablet Commonly known as:  PRINIVIL,ZESTRIL Take 1 tablet (2.5 mg total) by mouth daily.   metFORMIN 500 MG tablet Commonly known as:  GLUCOPHAGE Take 1 tablet (500 mg total) by mouth 2 (two) times daily with a meal.   metoprolol tartrate 25 MG tablet Commonly known as:  LOPRESSOR Take 0.5 tablets (12.5 mg total) by mouth 2 (two) times daily.   multivitamin tablet Take 1 tablet by mouth daily.   nitroGLYCERIN 0.4 MG SL tablet Commonly known as:  NITROSTAT Place 1 tablet (0.4 mg total) under the tongue every 5 (five) minutes x 3 doses as needed for chest pain.            Discharge Care Instructions        Start     Ordered   04/22/17 0000  Increase activity slowly     04/22/17 1602   04/22/17 0000  Diet - low sodium heart healthy     04/22/17 1602   04/22/17 0000  Discharge instructions    Comments:  Outpatient stress test   04/22/17 1602   04/22/17 0000  Diet Carb  Modified     04/22/17 1602     Follow-up Information    [name], MD Follow up in 1 week(s).   Specialty:  Internal Medicine Contact information: [address] [place] [address] [phone]        [name], MD Follow up.   Specialty:  Cardiology Why:  outpatient stress test Contact information: [address] [place] [address] [phone]  Time coordinating discharge: 35 min  Signed:  Emberlie Gotcher U [place]   [facility] 04/22/2017, 4:05 PM
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
## 2017-04-22 NOTE — ED Provider Notes (Signed)
[facility]-EMERGENCY DEPT Provider Note   CSN: [id number] Arrival date & time: 04/21/17  1818     History   Chief Complaint Chief Complaint  Patient presents with  . Chest Pain    HPI Ian [name] is a 46 y.o. male.  Patient with a history of CAD s/p previous RCA stent x 2, paroxysmal atrial fibrillation on Eliquis, ischemic CM with EF 45%, T2DM, HTN, HLD, ongoing tabaco abuse presents with intermittent, left sided chest pain since Friday (3 days ago), described as pressure and tightness. He endorses associated SOB. He had nausea on day one but none since. He reports taking a NTG x 1 on day one with some relief, and NTG x 1 this morning with some relief. He states his pain is 1/10 right now.    The history is provided by the patient. No language interpreter was used.  Chest Pain   Associated symptoms include nausea and shortness of breath. Pertinent negatives include no abdominal pain, no fever and no vomiting.    Past Medical History:  Diagnosis Date  . Arthritis    "left ankle" (09/04/2016)  . Complication of anesthesia    "hard to get me out of it sometimes; usually takes me longer to get under"  . Coronary artery disease   . History of palpitations   . HLD (hyperlipidemia)   . Hypertension   . NSTEMI (non-ST elevated myocardial infarction) (HCC) 05/2015   [name] 05/23/2015 "minor one"  . PAF (paroxysmal atrial fibrillation) (HCC)   . Type II diabetes mellitus [facility])     Patient Active Problem List   Diagnosis Date Noted  . Abnormal nuclear stress test 09/06/2016  . CAD (coronary artery disease) 09/06/2016  . S/P PTCA (percutaneous transluminal coronary angioplasty)   . PAF (paroxysmal atrial fibrillation) (HCC) 12/19/2015  . Essential hypertension 12/19/2015  . Ischemic cardiomyopathy 12/19/2015  . Atrial fibrillation (HCC) 05/21/2015  . Hyperlipidemia 05/21/2015  . Type 2 diabetes mellitus (HCC) 05/21/2015  . Tobacco abuse 05/21/2015    Past Surgical History:    Procedure Laterality Date  . CARDIAC CATHETERIZATION  2000, 2005   "less than 20% blockage"  . CARDIAC CATHETERIZATION N/A 05/22/2015   Procedure: Left Heart Cath and Coronary Angiography;  Surgeon: [name], MD;  Location: [facility] INVASIVE CV LAB;  Service: Cardiovascular;  Laterality: N/A;  . CARDIAC CATHETERIZATION N/A 05/22/2015   Procedure: Coronary Stent Intervention;  Surgeon: [name], MD;  Location: [facility] INVASIVE CV LAB;  Service: Cardiovascular;  Laterality: N/A;  . CARDIAC CATHETERIZATION N/A 09/04/2016   Procedure: Left Heart Cath and Coronary Angiography;  Surgeon: [name], MD;  Location: [facility] INVASIVE CV LAB;  Service: Cardiovascular;  Laterality: N/A;  . CARDIAC CATHETERIZATION N/A 09/04/2016   Procedure: Coronary Balloon Angioplasty;  Surgeon: [name], MD;  Location: [facility] INVASIVE CV LAB;  Service: Cardiovascular;  Laterality: N/A;  . CARDIAC CATHETERIZATION N/A 09/05/2016   Procedure: Coronary Atherectomy;  Surgeon: Marykay Lex, MD;  Location: [facility] INVASIVE CV LAB;  Service: Cardiovascular;  Laterality: N/A;  . CARDIAC CATHETERIZATION N/A 09/05/2016   Procedure: Coronary Stent Intervention;  Surgeon: Marykay Lex, MD;  Location: [facility] INVASIVE CV LAB;  Service: Cardiovascular;  Laterality: N/A;  . CARDIAC CATHETERIZATION N/A 09/05/2016   Procedure: Temporary Pacemaker;  Surgeon: Marykay Lex, MD;  Location: [facility] INVASIVE CV LAB;  Service: Cardiovascular;  Laterality: N/A;  . CORONARY ANGIOPLASTY    . MULTIPLE TOOTH EXTRACTIONS  2013   "all of them"  .  TYMPANOSTOMY TUBE PLACEMENT Bilateral 1970s       Home Medications    Prior to Admission medications   Medication Sig Start Date End Date Taking? Authorizing Provider  apixaban (ELIQUIS) 5 MG TABS tablet Take 1 tablet (5 mg total) by mouth 2 (two) times daily. 02/28/17   Allayne Butcher, [name]  aspirin EC 81 MG tablet Take 81 mg by mouth daily.    [provider]   atorvastatin (LIPITOR) 80 MG tablet Take 1 tablet (80 mg total) by mouth daily at 6 PM. 02/28/17   Allayne Butcher, [name]  clopidogrel (PLAVIX) 75 MG tablet Take 1 tablet (75 mg total) by mouth daily. 02/28/17   Robbie Lis M, [name]  diltiazem (CARDIZEM CD) 120 MG 24 hr capsule Take 1 capsule (120 mg total) by mouth daily. 02/28/17   Robbie Lis M, [name]  fenofibrate (TRICOR) 48 MG tablet Take 1 tablet (48 mg total) by mouth daily. 03/04/17   Robbie Lis M, [name]  furosemide (LASIX) 20 MG tablet Take 1 tablet (20 mg total) by mouth daily. 02/28/17   Robbie Lis M, [name]  glipiZIDE (GLUCOTROL) 5 MG tablet Take 5 mg by mouth daily before breakfast.     [provider]  lisinopril (PRINIVIL,ZESTRIL) 2.5 MG tablet Take 1 tablet (2.5 mg total) by mouth daily. 02/28/17   Robbie Lis M, [name]  metFORMIN (GLUCOPHAGE) 500 MG tablet Take 1 tablet (500 mg total) by mouth 2 (two) times daily with a meal. 05/23/15   Dwana Melena, [name]  metoprolol tartrate (LOPRESSOR) 25 MG tablet Take 0.5 tablets (12.5 mg total) by mouth 2 (two) times daily. 02/28/17   Allayne Butcher, [name]  Multiple Vitamin (MULTIVITAMIN) tablet Take 1 tablet by mouth daily.    [provider]  nitroGLYCERIN (NITROSTAT) 0.4 MG SL tablet Place 1 tablet (0.4 mg total) under the tongue every 5 (five) minutes x 3 doses as needed for chest pain. 05/23/15   Dwana Melena, [name]  Omega-3 Fatty Acids (FISH OIL) 1200 MG CAPS Take 1,200 mg by mouth daily.     [provider]    Family History Family History  Problem Relation Age of Onset  . Diabetes Mother   . Heart attack Father   . Diabetes Sister   . Diabetes Maternal Grandmother     Social History Social History  Substance Use Topics  . Smoking status: Current Every Day Smoker    Packs/day: 2.00    Years: 35.00    Types: Cigarettes, E-cigarettes  . Smokeless tobacco: Never Used  . Alcohol use 0.0 oz/week     Comment:  09/04/2016  "might have a few drinks/year; nothing since 05/2015"     Allergies   Patient has no known allergies.   Review of Systems Review of Systems  Constitutional: Negative for chills and fever.  Respiratory: Positive for shortness of breath.   Cardiovascular: Positive for chest pain. Negative for leg swelling.  Gastrointestinal: Positive for nausea. Negative for abdominal pain and vomiting.  Musculoskeletal: Negative.   Skin: Negative.   Neurological: Negative.  Negative for light-headedness.     Physical Exam Updated Vital Signs BP (!) 131/93 (BP Location: Right Arm)   Pulse 86   Temp 98.2 F (36.8 C) (Oral)   Resp 16   SpO2 98%   Physical Exam  Constitutional: He is oriented to person, place, and time. He appears well-developed and well-nourished.  HENT:  Head: Normocephalic.  Neck: Normal range of motion. Neck supple.  Cardiovascular: Normal rate.  An irregularly irregular rhythm present.  No murmur heard. Pulmonary/Chest: Effort normal and breath sounds normal. He has no wheezes. He has no rales.  Abdominal: Soft. Bowel sounds are normal. There is no tenderness. There is no rebound and no guarding.  Musculoskeletal: Normal range of motion. He exhibits edema (1+ edema bilaterally.).  Neurological: He is alert and oriented to person, place, and time.  Skin: Skin is warm and dry. No rash noted.  Psychiatric: He has a normal mood and affect.     ED Treatments / Results  Labs (all labs ordered are listed, but only abnormal results are displayed) Labs Reviewed  BASIC METABOLIC PANEL - Abnormal; Notable for the following:       Result Value   Glucose, Bld 171 (*)    All other components within normal limits  CBC - Abnormal; Notable for the following:    Platelets 145 (*)    All other components within normal limits  I-STAT TROPONIN, ED   Results for orders placed or performed during the hospital encounter of 04/22/17  Basic metabolic panel  Result Value  Ref Range   Sodium 136 135 - 145 mmol/L   Potassium 3.9 3.5 - 5.1 mmol/L   Chloride 103 101 - 111 mmol/L   CO2 26 22 - 32 mmol/L   Glucose, Bld 171 (H) 65 - 99 mg/dL   BUN 12 6 - 20 mg/dL   Creatinine, Ser 1.61 0.61 - 1.24 mg/dL   Calcium 9.7 8.9 - 09.6 mg/dL   GFR calc non Af Amer >60 >60 mL/min   GFR calc Af Amer >60 >60 mL/min   Anion gap 7 5 - 15  CBC  Result Value Ref Range   WBC 9.9 4.0 - 10.5 K/uL   RBC 4.73 4.22 - 5.81 MIL/uL   Hemoglobin 15.8 13.0 - 17.0 g/dL   HCT 04.5 40.9 - 81.1 %   MCV 97.0 78.0 - 100.0 fL   MCH 33.4 26.0 - 34.0 pg   MCHC 34.4 30.0 - 36.0 g/dL   RDW 91.4 78.2 - 95.6 %   Platelets 145 (L) 150 - 400 K/uL  I-stat troponin, ED  Result Value Ref Range   Troponin i, poc 0.00 0.00 - 0.08 ng/mL   Comment 3            EKG  EKG Interpretation None       Radiology Dg Chest 2 View  Result Date: 04/21/2017 CLINICAL DATA:  Left-sided chest pain, fatigue and nausea x3 weeks. EXAM: CHEST  2 VIEW COMPARISON:  05/21/2015 FINDINGS: Heart is top-normal in size. No aortic aneurysm is identified. Curvilinear right paraspinal soft tissue density appears stable and may reflect a tortuous thoracic aorta. No definite pulmonary mass. Streaky parenchymal opacities are seen at each lung base likely to reflect atelectasis and/or scarring. Attenuation of the upper lobe pulmonary vessels likely reflects emphysematous disease. No effusion or pneumothorax. No pulmonary edema. IMPRESSION: 1. Attenuated upper lobe pulmonary vessels consistent with emphysematous change. 2. No active cardiopulmonary disease. Electronically Signed   By: Tollie Eth M.D.   On: 04/21/2017 19:16    Procedures Procedures (including critical care time)  Medications Ordered in ED Medications - No data to display   Initial Impression / Assessment and Plan / ED Course  I have reviewed the triage vital signs and the nursing notes.  Pertinent labs & imaging results that were available during my care  of the patient were reviewed by me and  considered in my medical decision making (see chart for details).     Patient with significant cardiac history presents with chest pain familiar to him as angina x 3 days. Last stent 08/2016 (Dr. Tobias AlexanderKatarina [name]) for in-stent restenosis of previous DES.   Troponin negative, EKG show atrial fibrillation without RVR, CXR without edema. Given risk factors and h/o significant heart disease, will admit for MI r/o. D[facility] with [facility] Maryfrances Bunnellanford[facility] who [facility].   Final Clinical Impressions(s) / ED Diagnoses   Final diagnoses:  None   1. Chest pain 2. History of CAD  New Prescriptions New Prescriptions   No medications on file     Elpidio AnisUpstill, Ruxin Ransome, Cordelia Poche-C 04/22/17 [id number]    Ward, Layla MawKristen N, DO 04/22/17 [phone]

## 2017-04-22 NOTE — ED Notes (Signed)
Pt inquiring about stress test that was mentioned to him last night, no order noted in chart for  stress test, Dr. Benjamine MolaVann with Hospitalist team paged in regards to this.

## 2017-04-22 NOTE — Progress Notes (Signed)
Page placed to Dr Benjamine MolaVann to inform of cards clearance, and approval for d/c.

## 2017-04-22 NOTE — Progress Notes (Signed)
   Order for outpatient NST has been placed. Pt to get stress test end of this week. Our office will call pt with appt time.   Brittainy Sharol HarnessSimmons

## 2017-04-22 NOTE — ED Notes (Signed)
Delay in lab draw,  Pt in bathroom. 

## 2017-04-22 NOTE — H&P (Signed)
History and Physical  Patient Name: Ian [name]     ZOX:[id number]    DOB: 1971/01/18    DOA: 04/22/2017 PCP: [name], MD   Patient coming from: Home     Chief Complaint: Chest pain  HPI: Ian [name] is a 46 y.o. male with a past medical history significant for CAD s/p DES in 2016 and again Jan 2018, NIDDM, HTN, acitve smoking and Afib on Eliquis who presents with chest pain.  The patient was in his usual state of health until about three days ago when he started to have intermittent chest tightness.  This was sometimes exertional, sometimes not, was accompanied by exertional fatigue, also felt like "a chest cold" but without other URI symptoms, and also felt very similar to his previous angina in 2016 only not as severe, "like a warning sign".    ED course: -Afebrile, heart rate 73, respirations and pulse ox normal, BP 123/99 -Initial ECG showed no acute change and troponin was negative. -Na 136, K 3.9, Cr 1.03, WBC 9.9, Hgb 15.8 -Troponin negative -CXR showed no focal opacity or edema -TRH was asked to admit for observation, serial troponins and risk stratification.    He has HTN, NIDDM, active smoker, prior coronary disease (PCI in 2016 and re-stenosis PCI in 2018).       Review of Systems:  Review of Systems  Constitutional: Positive for malaise/fatigue. Negative for chills and fever.  HENT: Negative for congestion, sinus pain and sore throat.   Respiratory: Negative for cough, sputum production, shortness of breath and wheezing.   Cardiovascular: Positive for chest pain.     Past Medical History:  Diagnosis Date  . Arthritis    "left ankle" (09/04/2016)  . Complication of anesthesia    "hard to get me out of it sometimes; usually takes me longer to get under"  . Coronary artery disease   . History of palpitations   . HLD (hyperlipidemia)   . Hypertension   . NSTEMI (non-ST elevated myocardial infarction) (HCC) 05/2015   [name] 05/23/2015 "minor  one"  . PAF (paroxysmal atrial fibrillation) (HCC)   . Type II diabetes mellitus (HCC)     Past Surgical History:  Procedure Laterality Date  . CARDIAC CATHETERIZATION  2000, 2005   "less than 20% blockage"  . CARDIAC CATHETERIZATION N/A 05/22/2015   Procedure: Left Heart Cath and Coronary Angiography;  Surgeon: [name], MD;  Location: [facility] INVASIVE CV LAB;  Service: Cardiovascular;  Laterality: N/A;  . CARDIAC CATHETERIZATION N/A 05/22/2015   Procedure: Coronary Stent Intervention;  Surgeon: [name], MD;  Location: [facility] INVASIVE CV LAB;  Service: Cardiovascular;  Laterality: N/A;  . CARDIAC CATHETERIZATION N/A 09/04/2016   Procedure: Left Heart Cath and Coronary Angiography;  Surgeon: [name], MD;  Location: [facility] INVASIVE CV LAB;  Service: Cardiovascular;  Laterality: N/A;  . CARDIAC CATHETERIZATION N/A 09/04/2016   Procedure: Coronary Balloon Angioplasty;  Surgeon: [name], MD;  Location: [facility] INVASIVE CV LAB;  Service: Cardiovascular;  Laterality: N/A;  . CARDIAC CATHETERIZATION N/A 09/05/2016   Procedure: Coronary Atherectomy;  Surgeon: Marykay Lex, MD;  Location: [facility] INVASIVE CV LAB;  Service: Cardiovascular;  Laterality: N/A;  . CARDIAC CATHETERIZATION N/A 09/05/2016   Procedure: Coronary Stent Intervention;  Surgeon: Marykay Lex, MD;  Location: [facility] INVASIVE CV LAB;  Service: Cardiovascular;  Laterality: N/A;  . CARDIAC CATHETERIZATION N/A 09/05/2016   Procedure: Temporary Pacemaker;  Surgeon: Marykay Lex, MD;  Location: [facility] INVASIVE CV LAB;  Service: Cardiovascular;  Laterality: N/A;  . CORONARY ANGIOPLASTY    . MULTIPLE TOOTH EXTRACTIONS  2013   "all of them"  . TYMPANOSTOMY TUBE PLACEMENT Bilateral 1970s    Social History: Patient lives with his wife.  Patient walks unassisted.  He still smokes.  He does not use alcohol.  [place]  He drives tanker trucks.    No Known Allergies  Family history: family  history includes Diabetes in his maternal grandmother, mother, and sister; Heart attack in his father.  Prior to Admission medications   Medication Sig Start Date End Date Taking? Authorizing Provider  apixaban (ELIQUIS) 5 MG TABS tablet Take 1 tablet (5 mg total) by mouth 2 (two) times daily. 02/28/17  Yes [name], [name], [name]  atorvastatin (LIPITOR) 80 MG tablet Take 1 tablet (80 mg total) by mouth daily at 6 PM. 02/28/17  Yes [name], [name], [name]  clopidogrel (PLAVIX) 75 MG tablet Take 1 tablet (75 mg total) by mouth daily. 02/28/17  Yes Sharol HarnessSimmons, [name], [name]  diltiazem (CARDIZEM CD) 120 MG 24 hr capsule Take 1 capsule (120 mg total) by mouth daily. 02/28/17  Yes Simmons, [name], [name]  fenofibrate (TRICOR) 48 MG tablet Take 1 tablet (48 mg total) by mouth daily. 03/04/17  Yes [name], [name], [name]  furosemide (LASIX) 20 MG tablet Take 1 tablet (20 mg total) by mouth daily. 02/28/17  Yes Simmons, [name], [name]  glipiZIDE (GLUCOTROL) 5 MG tablet Take 5-10 mg by mouth See admin instructions. 10mg  in the am and 5mg  in the evening   Yes [provider]  lisinopril (PRINIVIL,ZESTRIL) 2.5 MG tablet Take 1 tablet (2.5 mg total) by mouth daily. 02/28/17  Yes [name], [name], [name]  metFORMIN (GLUCOPHAGE) 500 MG tablet Take 1 tablet (500 mg total) by mouth 2 (two) times daily with a meal. 05/23/15  Yes [name], [name], [name]  metoprolol tartrate (LOPRESSOR) 25 MG tablet Take 0.5 tablets (12.5 mg total) by mouth 2 (two) times daily. 02/28/17  Yes [name], [name], [name]  Multiple Vitamin (MULTIVITAMIN) tablet Take 1 tablet by mouth daily.   Yes [provider]  nitroGLYCERIN (NITROSTAT) 0.4 MG SL tablet Place 1 tablet (0.4 mg total) under the tongue every 5 (five) minutes x 3 doses as needed for chest pain. 05/23/15  Yes [name], [name], [name]  Omega-3 Fatty Acids (FISH OIL) 1200 MG CAPS Take 1,200 mg by mouth daily.    Yes [provider]        Physical Exam: BP 115/79 (BP Location: Left Arm)   Pulse 87   Temp 98.2 F (36.8 C) (Oral)   Resp 12   SpO2 96%  General appearance: Well-developed, adult male, alert and in no acute distress.   Eyes: Anicteric, conjunctiva pink, lids and lashes normal.     ENT: No nasal deformity, discharge, or epistaxis.  OP moist without lesions.   Edentulous. Skin: Warm and dry.   Cardiac: RRR, nl S1-S2, no murmurs appreciated.  Capillary refill is brisk.  JVP normal.  No LE edema.  Radial and DP pulses 2+ and symmetric.  No carotid bruits. Respiratory: Normal respiratory rate and rhythm.  CTAB without rales or wheezes. GI: Abdomen soft without rigidity.  No TTP. No ascites, distension.   MSK: No deformities or effusions.   Pain NOT reproduced with palpation of precordium.  No pain with arm movement. Neuro: Sensorium intact and responding to questions, attention normal.  Speech is fluent.  Moves all extremities equally and with normal coordination.    Psych: Behavior appropriate.  Affect normal.  No evidence of aural or visual hallucinations or delusions.       Labs on Admission:  The metabolic panel shows normal electrolytes and renal function. The complete blood count shows no anemia, leukocytosis or thrombocytopenia. The initial troponin is negative.  Radiological Exams on Admission: Personally reviewed CXR shows no focal opacity or edema: Dg Chest 2 View  Result Date: 04/21/2017 CLINICAL DATA:  Left-sided chest pain, fatigue and nausea x3 weeks. EXAM: CHEST  2 VIEW COMPARISON:  05/21/2015 FINDINGS: Heart is top-normal in size. No aortic aneurysm is identified. Curvilinear right paraspinal soft tissue density appears stable and may reflect a tortuous thoracic aorta. No definite pulmonary mass. Streaky parenchymal opacities are seen at each lung base likely to reflect atelectasis and/or scarring. Attenuation of the upper lobe pulmonary vessels likely reflects emphysematous disease. No  effusion or pneumothorax. No pulmonary edema. IMPRESSION: 1. Attenuated upper lobe pulmonary vessels consistent with emphysematous change. 2. No active cardiopulmonary disease. Electronically Signed   By: Tollie Eth M.D.   On: 04/21/2017 19:16    EKG: Independently reviewed. Rate 89, QTc 445, no ST changes.    Assessment/Plan  1. Chest pain: This is new.  The patient has HEART score of 4. Angina is atypical in that it is not exertional.  Other potential causes of chest pain (PE, dissection, pancreatitis, pneumonia/effusion, pericarditis) are doubted.  We have been asked to admit the patient for observation and etiology consultation with Cardiology tomorrow.  -Serial troponins are ordered -Telemetry -Consult to cardiology, appreciate recommendations -Smoking cessation was recommended, specific modalities discussed, patient active phase, nursing teaching ordered.   2. PAF:  CHADS2Vasc 3, on Eliquis. -Continue Eliquis -Hold dilt and metop for possible stress  3. Coronary disease secondary prevention and Hypertension:  -Hold metoprolol and diltiazem.on lisinopril and furosemide -Continue fibrate and statin and Plavix  4. Diabetes:  -Hold metformin and glipizide -SSI with meals      DVT prophylaxis: N/A Diet: NPO after 4am for anticipated stress testing Code Status: FULL  Family Communication: Wife and daughter at bedside  Disposition Plan: Anticipate overnight observation for arrhythmia on telemetry, serial troponins and subsequent risk stratification by Cardiology.  If testing negative, home after. Consults called: Cardiology via Inbasket Admission status: Telemetry, OBS   Medical decision making: Patient seen at 2:40 AM on 04/22/2017.  The patient was discussed with Elpidio Anis, [name]. What exists of the patient's chart was reviewed in depth.  Clinical condition: stable.      Alberteen Sam [facility] Pager [phone]

## 2017-04-22 NOTE — ED Notes (Signed)
MD at bedside to see patient

## 2017-04-23 ENCOUNTER — Telehealth (HOSPITAL_COMMUNITY): Payer: Self-pay | Admitting: Cardiology

## 2017-04-25 NOTE — Telephone Encounter (Signed)
User: Trina Ao A Date/time: 04/24/17 10:58 AM  Comment: Called pt and lmsg for him to CB to sch myoview.   Context:  Outcome: Left Message  Phone number: [phone] Phone Type: Home Phone  Comm. type: Telephone Call type: Outgoing  Contact: [name] Relation to patient: Self    User: Trina Ao A Date/time: 04/23/17 12:59 PM  Comment: Called pt and lmsg for him to CB to sch myoview.   Context:  Outcome: Left Message  Phone number: [phone] Phone Type: Home Phone  Comm. type: Telephone Call type: Outgoing  Contact: [name] Relation to patient: Self

## 2017-04-29 ENCOUNTER — Telehealth (HOSPITAL_COMMUNITY): Payer: Self-pay | Admitting: *Deleted

## 2017-04-29 ENCOUNTER — Other Ambulatory Visit: Payer: 59

## 2017-04-29 NOTE — Telephone Encounter (Signed)
Left message on voicemail per DPR in reference to upcoming appointment scheduled on 0924/18 at 0745 with detailed instructions given per Myocardial Perfusion Study Information Sheet for the test. LM to arrive 15 minutes early, and that it is imperative to arrive on time for appointment to keep from having the test rescheduled. If you need to cancel or reschedule your appointment, please call the office within 24 hours of your appointment. Failure to do so may result in a cancellation of your appointment, and a $50 no show fee. Phone number given for call back for any questions.

## 2017-05-05 ENCOUNTER — Ambulatory Visit (HOSPITAL_COMMUNITY): Payer: 59 | Attending: Cardiology

## 2017-05-05 ENCOUNTER — Other Ambulatory Visit: Payer: 59 | Admitting: *Deleted

## 2017-05-05 DIAGNOSIS — R9439 Abnormal result of other cardiovascular function study: Secondary | ICD-10-CM | POA: Insufficient documentation

## 2017-05-05 DIAGNOSIS — R079 Chest pain, unspecified: Secondary | ICD-10-CM | POA: Diagnosis not present

## 2017-05-05 DIAGNOSIS — I10 Essential (primary) hypertension: Secondary | ICD-10-CM

## 2017-05-05 DIAGNOSIS — E7800 Pure hypercholesterolemia, unspecified: Secondary | ICD-10-CM

## 2017-05-05 LAB — MYOCARDIAL PERFUSION IMAGING
CHL CUP NUCLEAR SDS: 1
CHL CUP RESTING HR STRESS: 60 {beats}/min
LVDIAVOL: 167 mL (ref 62–150)
LVSYSVOL: 92 mL
Peak HR: 77 {beats}/min
RATE: 0.38
SRS: 4
SSS: 5
TID: 1.03

## 2017-05-05 LAB — HEPATIC FUNCTION PANEL
ALT: 27 IU/L (ref 0–44)
AST: 21 IU/L (ref 0–40)
Albumin: 4.1 g/dL (ref 3.5–5.5)
Alkaline Phosphatase: 71 IU/L (ref 39–117)
Bilirubin Total: 0.4 mg/dL (ref 0.0–1.2)
Bilirubin, Direct: 0.12 mg/dL (ref 0.00–0.40)
Total Protein: 6.1 g/dL (ref 6.0–8.5)

## 2017-05-05 LAB — LIPID PANEL
Chol/HDL Ratio: 5 ratio (ref 0.0–5.0)
Cholesterol, Total: 144 mg/dL (ref 100–199)
HDL: 29 mg/dL — ABNORMAL LOW (ref >39)
LDL Calculated: 57 mg/dL (ref 0–99)
Triglycerides: 291 mg/dL — ABNORMAL HIGH (ref 0–149)
VLDL Cholesterol Cal: 58 mg/dL — ABNORMAL HIGH (ref 5–40)

## 2017-05-05 MED ORDER — REGADENOSON 0.4 MG/5ML IV SOLN
0.4000 mg | Freq: Once | INTRAVENOUS | Status: AC
  Administered 2017-05-05: 0.4 mg via INTRAVENOUS

## 2017-05-05 MED ORDER — TECHNETIUM TC 99M TETROFOSMIN IV KIT
33.0000 | PACK | Freq: Once | INTRAVENOUS | Status: AC | PRN
  Administered 2017-05-05: 33 via INTRAVENOUS
  Filled 2017-05-05: qty 33

## 2017-05-05 MED ORDER — TECHNETIUM TC 99M TETROFOSMIN IV KIT
10.3000 | PACK | Freq: Once | INTRAVENOUS | Status: AC | PRN
  Administered 2017-05-05: 10.3 via INTRAVENOUS
  Filled 2017-05-05: qty 11

## 2017-05-05 NOTE — Addendum Note (Signed)
Addended by: Merrik Puebla K on: 05/05/2017 07:43 AM   Modules accepted: Orders  

## 2017-05-05 NOTE — Addendum Note (Signed)
Addended by: Tonita Phoenix on: 05/05/2017 07:43 AM   Modules accepted: Orders

## 2017-05-05 NOTE — Progress Notes (Signed)
ipid

## 2017-05-06 ENCOUNTER — Telehealth: Payer: Self-pay | Admitting: Cardiology

## 2017-05-06 NOTE — Telephone Encounter (Signed)
Pt is calling to inform Dr [name] that he has joined the [employer] and will soon start working out. Pt states he safely wants to do this.  Pt would like for Dr [name] to advise on how to safely start working out, with his cardiac hx.  Informed the pt that Dr [name] is out of the office today, but I will route this message to her for further review and recommendation and follow-up thereafter.  Pt verbalized understanding and agrees with this plan.

## 2017-05-06 NOTE — Telephone Encounter (Signed)
He can start exercising 30-60 minutes 5 x /week, he should limit his HR < 130 BPM.

## 2017-05-06 NOTE — Telephone Encounter (Signed)
Informed the pt of exercise recommendations per Dr. [name].  Pt verbalized understanding and agrees with this plan.

## 2017-05-06 NOTE — Telephone Encounter (Signed)
F/u message  Pt call requesting to speak with RN again to f/u with some questions about labs. Please call back to discuss

## 2017-05-09 ENCOUNTER — Encounter: Payer: Self-pay | Admitting: Cardiology

## 2017-05-09 NOTE — Telephone Encounter (Signed)
New message ° ° ° °Pt is returning call to nurse about results. °

## 2017-05-09 NOTE — Telephone Encounter (Signed)
This encounter was created in error - please disregard.

## 2017-07-18 ENCOUNTER — Ambulatory Visit: Payer: 59 | Admitting: Cardiology

## 2017-08-15 NOTE — Progress Notes (Signed)
Cardiology Office Note    Date:  08/18/2017   ID:  Ian [name], DOB 07-Oct-1970, MRN [id number]  PCP:  [name], MD  Cardiologist:  Dr. [name]   Chief Complaint:  CAD follow up/DOT physical   History of Present Illness:   Ian [name] is a 47 y.o. 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   In 2016, he presented with a NSTEMI + atrial fibrillation w/ RVR. LHC demonstrated 90% RCA disease, treated with PCI + DES. There was mild residual 25%mid circumflex lesion. EF was 45-50%. He spontaneously converted to NSR but was placed on anticoagulation with Eliquis given a CHA2DS2 VASc score >2. He was on tripple therapy with ASA, Plavix and Eliquis x 30 days. ASA was then discontinued.  In Jan. 2018, he presented for his annual DOT physical and complained of left chest and shoulder pain. He was set up for aNST. This returned abnormal for possible inferior ischemia and he was set up for [facility] on 09/04/16. This showed an 80% mRCA ISR s/p scoring balloon angioplasty. There was also 90% mRCA stenosis just past the stent, but Dr. [name] was unable to cross the lesion with a balloon. He underwent staged orbital atherectomy and DES placement (overlapping previous stent) to mid RCA lesion on 09/05/16. Once again, he was on triple therapy x 1 month, then ASA discontinued. His EF was also noted to be mildly reduced at that time at 40-45%.   Last seen 04/22/17 when admitted for afib at controlled rate and chest pain. Troponin negative. Follow up outpatient stress test was low risk.  Presents today for DOT physical. He is going to gym 3-4 times/weeks. He walks on treadmill for 10 minutes x 2 (initally able to do only one session) and does lifting. No chest pain or shortness of breath.  Patient denies any orthopnea, PND, syncope, lower extremity edema, dizziness, palpitation,  melena or blood in his stool or urine.  Compliant with medication.  Does have some exertional shortness of breath at work when he climbs ladder.  Not limiting his activity.  He did not require any nitroglycerin.  He continues to smoke 1 pack to 1-1/2 pack every day.       Past Medical History:  Diagnosis Date  . Arthritis    "left ankle" (09/04/2016)  . Complication of anesthesia    "hard to get me out of it sometimes; usually takes me longer to get under"  . Coronary artery disease   . History of palpitations   . HLD (hyperlipidemia)   . Hypertension   . NSTEMI (non-ST elevated myocardial infarction) (HCC) 05/2015   Ian [name] 05/23/2015 "minor one"  . PAF (paroxysmal atrial fibrillation) (HCC)   . Type II diabetes mellitus (HCC)     Past Surgical History:  Procedure Laterality Date  . CARDIAC CATHETERIZATION  2000, 2005   "less than 20% blockage"  . CARDIAC CATHETERIZATION N/A 05/22/2015   Procedure: Left Heart Cath and Coronary Angiography;  Surgeon: [name], MD;  Location[facility] INVASIVE CV LAB;  Service: Cardiovascular;  Laterality: N/A;  . CARDIAC CATHETERIZATION N/A 05/22/2015   Procedure: Coronary Stent Intervention;  Surgeon: [name], MD;  Location: [facility] INVASIVE CV LAB;  Service: Cardiovascular;  Laterality: N/A;  . CARDIAC CATHETERIZATION N/A 09/04/2016   Procedure: Left Heart Cath and Coronary Angiography;  Surgeon: [name], MD;  Location: [facility] INVASIVE CV LAB;  Service: Cardiovascular;  Laterality: N/A;  . CARDIAC CATHETERIZATION N/A 09/04/2016   Procedure: Coronary Balloon Angioplasty;  Surgeon: [name], MD;  Location: [facility] INVASIVE CV LAB;  Service: Cardiovascular;  Laterality: N/A;  . CARDIAC CATHETERIZATION N/A 09/05/2016   Procedure: Coronary Atherectomy;  Surgeon: [name], MD;  [facility];  Service: Cardiovascular;  Laterality: N/A;  . CARDIAC CATHETERIZATION N/A 09/05/2016   Procedure: Coronary Stent  Intervention;  Surgeon: [name], MD;  [facility];  Service: Cardiovascular;  Laterality: N/A;  . CARDIAC CATHETERIZATION N/A 09/05/2016   Procedure: Temporary Pacemaker;  Surgeon: [name], MD;  [facility];  Service: Cardiovascular;  Laterality: N/A;  . CORONARY ANGIOPLASTY    . MULTIPLE TOOTH EXTRACTIONS  2013   "all of them"  . TYMPANOSTOMY TUBE PLACEMENT Bilateral 1970s    Current Medications: Prior to Admission medications   Medication Sig Start Date End Date Taking? Authorizing Provider  apixaban (ELIQUIS) 5 MG TABS tablet Take 1 tablet (5 mg total) by mouth 2 (two) times daily. 02/28/17   [name], [name], [name]  atorvastatin (LIPITOR) 80 MG tablet Take 1 tablet (80 mg total) by mouth daily at 6 PM. 02/28/17   [name], [name], [name]  clopidogrel (PLAVIX) 75 MG tablet Take 1 tablet (75 mg total) by mouth daily. 02/28/17   [name], [name], [name]  diltiazem (CARDIZEM CD) 120 MG 24 hr capsule Take 1 capsule (120 mg total) by mouth daily. 02/28/17   [name], [name], [name]  fenofibrate (TRICOR) 48 MG tablet Take 1 tablet (48 mg total) by mouth daily. 03/04/17   [name], [name], [name]  furosemide (LASIX) 20 MG tablet Take 1 tablet (20 mg total) by mouth daily. 02/28/17   [name], [name], [name]  glipiZIDE (GLUCOTROL) 5 MG tablet Take 5-10 mg by mouth See admin instructions. 10mg  in the am and 5mg  in the evening    [provider]  lisinopril (PRINIVIL,ZESTRIL) 2.5 MG tablet Take 1 tablet (2.5 mg total) by mouth daily. 02/28/17   [name], [name], [name]  metFORMIN (GLUCOPHAGE) 500 MG tablet Take 1 tablet (500 mg total) by mouth 2 (two) times daily with a meal. 05/23/15   [name], [name], [name]  metoprolol tartrate (LOPRESSOR) 25 MG tablet Take 0.5 tablets (12.5 mg total) by mouth 2 (two) times daily. 02/28/17   [name], [name], [name]  Multiple Vitamin (MULTIVITAMIN) tablet Take 1 tablet by mouth daily.    [provider]  nitroGLYCERIN (NITROSTAT) 0.4 MG SL tablet Place 1 tablet (0.4 mg total) under the tongue every 5 (five) minutes x 3 doses as needed for chest pain. 05/23/15   [name], [name], [name]  Omega-3 Fatty Acids (FISH OIL) 1200 MG CAPS Take 1,200 mg by mouth daily.     [provider]    Allergies:   Patient has no known allergies.   Social History   Socioeconomic History  . Marital status: Married    Spouse name: None  . Number of children: None  . Years of education: None  . Highest education level: None  Social Needs  . Financial resource strain: None  . Food insecurity - worry: None  . Food insecurity - inability: None  . Transportation needs - medical: None  . Transportation needs - non-medical: None  Occupational History  . None  Tobacco Use  . Smoking status: Current Every Day Smoker  Packs/day: 2.00    Years: 35.00    Pack years: 70.00    Types: Cigarettes, E-cigarettes  . Smokeless tobacco: Never Used  Substance and Sexual Activity  . Alcohol use: Yes    Alcohol/week: 0.0 oz    Comment: 09/04/2016  "might have a few drinks/year; nothing since 05/2015"  . Drug use: Yes    Types: Cocaine, Marijuana    Comment: 09/04/2016 "haven't touched drugs since 1988"  . Sexual activity: Yes  Other Topics Concern  . None  Social History Narrative   Employed as a Naval architect. Resides in [place] and [place].     Family History:  The patient's family history includes Diabetes in his maternal grandmother, mother, and sister; Heart attack in his father.   ROS:   Please see the history of present illness.    ROS All other systems reviewed and are negative.   PHYSICAL EXAM:   VS:  BP 117/72   Pulse 78   Ht 5\' 11"  (1.803 m)   Wt 273 lb (123.8 kg)   SpO2 93%   BMI 38.08 kg/m    GEN: Well nourished, well developed, in no acute distress  HEENT: normal  Neck: no JVD, carotid bruits, or masses Cardiac: RRR; no murmurs, rubs, or gallops,no edema  Respiratory:   clear to auscultation bilaterally, normal work of breathing GI: soft, nontender, nondistended, + BS MS: no deformity or atrophy  Skin: warm and dry, no rash Neuro:  Alert and Oriented x 3, Strength and sensation are intact Psych: euthymic mood, full affect  Wt Readings from Last 3 Encounters:  08/18/17 273 lb (123.8 kg)  05/05/17 272 lb (123.4 kg)  04/22/17 272 lb 4.8 oz (123.5 kg)      Studies/Labs Reviewed:   EKG:  EKG is not ordered today.    Recent Labs: 04/21/2017: BUN 12; Creatinine, Ser 1.03; Hemoglobin 15.8; Platelets 145; Potassium 3.9; Sodium 136 05/05/2017: ALT 27   Lipid Panel    Component Value Date/Time   CHOL 144 05/05/2017 0744   TRIG 291 (H) 05/05/2017 0744   HDL 29 (L) 05/05/2017 0744   CHOLHDL 5.0 05/05/2017 0744   CHOLHDL 7.9 05/22/2015 0233   VLDL UNABLE TO CALCULATE IF TRIGLYCERIDE OVER 400 mg/dL 16/05/9603 5409   LDLCALC 57 05/05/2017 0744    Additional studies/ records that were reviewed today include:   Stress test 05/05/17  Nuclear stress EF: 45%.  There was no ST segment deviation noted during stress.  The left ventricular ejection fraction is mildly decreased (45-54%).  This is a low risk study.   1. EF 45% with diffuse mild hypokinesis.  2. Fixed medium-sized, mild basal to apical inferior perfusion defect.  It is possible that this represents infarction though gated images do not show a discrete wall motion abnormality.  No ischemia.   Low risk study.    Echocardiogram: 08/30/16 Study Conclusions  - Left ventricle: The cavity size was normal. Wall thickness was   normal. Systolic function was mildly to moderately reduced. The   estimated ejection fraction was in the range of 40% to 45%.   Diffuse hypokinesis.    Cardiac Catheterization: 09/05/16  intervention, there is a 5% residual stenosis.  _____________________________  Mid RCA lesion beyond the stent, 85 %stenosed. Tandem calcified lesions  Orbital Atherecomy using  CSI Diamondback catheter was performed.  A STENT SYNERGY DES [id number] drug eluting stent was successfully placed, and overlaps previously placed stent. (post-dilated to 3.8 mm, overlap was dilated to 4.1 mm)  Post intervention, there is a 0% residual stenosis.   Successful orbital atherectomy based PCI of the distal portion of the mid RCA overlapping the more proximal stent with another Synergy DES stent. This was preceded by repeat balloon angioplasty of the more proximal stent using a high pressure noncompliant balloon.  Plan:  Patient will return to 6 Central postprocedure unit for sheath removal  Continue dual independent therapy  Smoking cessation counseling  Anticipate discharge tomorrow    ASSESSMENT & PLAN:    1. CAD - Low risk myoview 04/2017. No angina.  He has dyspnea at work likely related to ongoing tobacco smoking.  He does exercise without any limitation as described above.  Continue Plavix, beta-blocker and statin.   2. ICM/Chronic systolic CHF - LVEF of 40-45% by echo 08/2016. EF of 45% by stress test 04/2017. -Continue metoprolol and lisinopril.  Euvolemic.  3. HTN -Stable and well controlled on current regimen.  4. HLD - 05/05/2017: Cholesterol, Total 144; HDL 29; LDL Calculated 57; Triglycerides 291  -LDL at goal.  5. Tobacco smoking -Advised cessation.  6. PAF - sinus by exam. Continue eliquis.   Medication Adjustments/Labs and Tests Ordered: Current medicines are reviewed at length with the patient today.  Concerns regarding medicines are outlined above.  Medication changes, Labs and Tests ordered today are listed in the Patient Instructions below. Patient Instructions  Medication Instructions:   Your physician recommends that you continue on your current medications as directed. Please refer to the Current Medication list given to you today.   If you need a refill on your cardiac medications before your next appointment, please call your  pharmacy.  Labwork: NONE ORDERED  TODAY    Testing/Procedures:  NONE ORDERED  TODAY    Follow-Up: Your physician wants you to follow-up in:  IN  6  MONTHS WITH DR Johnell Comings will receive a reminder letter in the mail two months in advance. If you don't receive a letter, please call our office to schedule the follow-up appointment.      Any Other Special Instructions Will Be Listed Below (If Applicable).                                                                                                                                                      Lorelei Pont, [place]  08/18/2017 10:43 AM    [facility] [address] [place], [place], [place]  [address] Phone: [phone]; Fax: [phone]

## 2017-08-18 ENCOUNTER — Ambulatory Visit: Payer: 59 | Admitting: Physician Assistant

## 2017-08-18 ENCOUNTER — Encounter: Payer: Self-pay | Admitting: Physician Assistant

## 2017-08-18 ENCOUNTER — Encounter: Payer: Self-pay | Admitting: *Deleted

## 2017-08-18 VITALS — BP 117/72 | HR 78 | Ht 71.0 in | Wt 273.0 lb

## 2017-08-18 DIAGNOSIS — I10 Essential (primary) hypertension: Secondary | ICD-10-CM

## 2017-08-18 DIAGNOSIS — I2510 Atherosclerotic heart disease of native coronary artery without angina pectoris: Secondary | ICD-10-CM | POA: Diagnosis not present

## 2017-08-18 DIAGNOSIS — I480 Paroxysmal atrial fibrillation: Secondary | ICD-10-CM | POA: Diagnosis not present

## 2017-08-18 DIAGNOSIS — E782 Mixed hyperlipidemia: Secondary | ICD-10-CM | POA: Diagnosis not present

## 2017-08-18 DIAGNOSIS — I255 Ischemic cardiomyopathy: Secondary | ICD-10-CM | POA: Diagnosis not present

## 2017-08-18 NOTE — Patient Instructions (Signed)
Medication Instructions:   Your physician recommends that you continue on your current medications as directed. Please refer to the Current Medication list given to you today.    If you need a refill on your cardiac medications before your next appointment, please call your pharmacy.  Labwork: NONE ORDERED  TODAY    Testing/Procedures: NONE ORDERED  TODAY    Follow-Up: Your physician wants you to follow-up in:  IN  6  MONTHS WITH DR [name]  You will receive a reminder letter in the mail two months in advance. If you don't receive a letter, please call our office to schedule the follow-up appointment.      Any Other Special Instructions Will Be Listed Below (If Applicable).                                                                                                                                                   

## 2017-09-04 ENCOUNTER — Telehealth: Payer: Self-pay | Admitting: Cardiology

## 2017-09-04 NOTE — Telephone Encounter (Signed)
New message  Patient wife calling with questions about Saxenda, while taking heart medications. Please call  Pt c/o medication issue:  1. Name of Medication: Saxenda  2. How are you currently taking this medication (dosage and times per day)? n/a  3. Are you having a reaction (difficulty breathing--STAT)? NO 4. What is your medication issue?  Patient wife wants to discuss medication

## 2017-09-04 NOTE — Telephone Encounter (Signed)
Follow Up:      Returning your call from today. 

## 2017-09-04 NOTE — Telephone Encounter (Signed)
Spoke with the Ian [name] and informed him that Ian CoveySaxenda is not contraindicated and does not interact with his cardiac meds he's taking.  Informed the Ian [name] that this was reviewed by our Pharmacist Prudence DavidsonKelley Auten.  Informed the Ian [name] that he may start taking this med as his PCP ordered.  Ian [name] verbalized understanding and agrees with this plan.  Ian [name] gracious for all the assistance provided.

## 2017-09-04 NOTE — Telephone Encounter (Signed)
Sp[place] the pts [place] ([place] and endorsed to her that new med Saxenda does not interact with his cardiac meds taking. Wife states that the pts PCP was hesitant starting him on this med if he has existing heart disease.  Endorsed to the pts wife that he does have CAD with stents, and is on cardiac meds for this.  Wife verbalized understanding and gracious for all the assistance provided.

## 2017-10-17 ENCOUNTER — Ambulatory Visit: Payer: 59 | Admitting: Cardiology

## 2017-12-04 ENCOUNTER — Other Ambulatory Visit: Payer: Self-pay | Admitting: Cardiology

## 2017-12-04 NOTE — Telephone Encounter (Signed)
°*  STAT* If patient is at the pharmacy, call can be transferred to refill team.   1. Which medications need to be refilled? (please list name of each medication and dose if known) Lisinopril,Cartia,and Clopidogrel  2. Which pharmacy/location (including street and city if local pharmacy) is medication to be sent to?[phone]  3. Do they need a 30 day or 90 day supply? 90 and refills

## 2017-12-04 NOTE — Telephone Encounter (Signed)
REFILL 

## 2017-12-05 IMAGING — NM NM MISC PROCEDURE
2 series · 12 of 12 positions shown · non-contrast
Comparison: none

[Series 1: wbr_r-proj_st rest-mc_(id)_sa · 6.5mm · 6.51mm/px · 6 of 64 frames shown]
[frame 6/64]
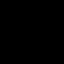
[frame 16/64]
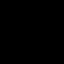
[frame 27/64]
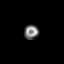
[frame 38/64]
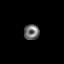
[frame 48/64]
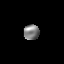
[frame 59/64]
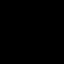

[Series 1: wbr_s-proj_st nongated stress-mc_(id)_sa · 6.5mm · 6.51mm/px · 6 of 64 frames shown]
[frame 6/64]
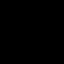
[frame 16/64]
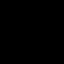
[frame 27/64]
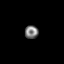
[frame 38/64]
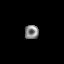
[frame 48/64]
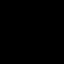
[frame 59/64]
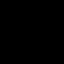

[12 of 12 positions shown; findings below may reference images not displayed]

Canned report from images found in remote index.

Refer to host system for actual result text.

## 2018-01-02 ENCOUNTER — Other Ambulatory Visit: Payer: Self-pay | Admitting: Cardiology

## 2018-01-02 DIAGNOSIS — E785 Hyperlipidemia, unspecified: Secondary | ICD-10-CM

## 2018-01-02 DIAGNOSIS — I2510 Atherosclerotic heart disease of native coronary artery without angina pectoris: Secondary | ICD-10-CM

## 2018-01-02 DIAGNOSIS — I480 Paroxysmal atrial fibrillation: Secondary | ICD-10-CM

## 2018-01-02 NOTE — Telephone Encounter (Signed)
Follow up     *STAT* If patient is at the pharmacy, call can be transferred to refill team.   1. Which medications need to be refilled? (please list name of each medication and dose if known) furosemide (LASIX) 20 MG tablet  2. Which pharmacy/location (including street and city if local pharmacy) is medication to be sent to? [facility] [address] [place] - [address]  3. Do they need a 30 day or 90 day supply? 90

## 2018-02-03 ENCOUNTER — Telehealth: Payer: Self-pay | Admitting: Cardiology

## 2018-02-03 NOTE — Telephone Encounter (Signed)
Wife is calling the office for 2 reasons:  1.  Wife is calling on behalf of the pt to schedule his overdue 6 month follow-up appt.  Wife states he was overdue a month ago for follow-up, but the office never called him back to arrange this.  Wife states that the scheduler offered for the pt to be seen by Dr Delton SeeNelson in late Sept.  Wife states they did not offer an Extender to see the pt.  Wife wants the pt to be seen by either Dr Delton SeeNelson or an Extender on her team, at a sooner appt than what was offered.  Scheduled the pt to see Dayna Dunn [name] for 02/16/18 at 2 pm.  Wife is aware that he needs to be 15 mins prior to this appt.   2.  2nd reason the wife is calling is to inform myself and Dr Delton SeeNelson that the pt has had intermittent chest pain for several weeks, but now over the past 2 days, he has been experiencing nausea with this.  Wife states [place].  Wife states that the pt said that his symptoms were coming from a bad fish sandwich he ate.  Wife is concerned because he is having intermittent chest pain with this, he drives a truck, and he has an extensive cardiac history.  Wife states he has not taken any nitro, for the pt told her that his symptoms aren't that bad. Wife is wanting further recommendations on this issue. Informed the pts wife that given his active complaints, his cardiac history, and with his occupation, he should refer to the nearest ER or acute care center, for further evaluation of complaints mentioned.  Informed the pts wife that it is unsafe for me to advise him that he is ok to wait until his 7/8 follow-up appt.  Advised the pts wife that he needs to have this assessed asap.  Advised the pts wife that given she communicates for him (can't take calls while driving but can talk to family), she should call him now and endorse this, and call me back to give me the status of his decision.   Wife verbalized understanding and agrees with this plan.   Wife will call and give our operator an update on pts plan of care, while being out of state. Will forward this plan to Dr Delton SeeNelson as a general FYI, upon return to the office.

## 2018-02-03 NOTE — Telephone Encounter (Signed)
New Message   Pt c/o of Chest Pain: STAT if CP now or developed within 24 hours  1. Are you having CP right now? no  2. Are you experiencing any other symptoms (ex. SOB, nausea, vomiting, sweating)? Nausea but possibly from a fish sandwich   3. How long have you been experiencing CP? Past 2 days  4. Is your CP continuous or coming and going? Coming and going  5. Have you taken Nitroglycerin? no ?

## 2018-02-15 ENCOUNTER — Encounter: Payer: Self-pay | Admitting: Physician Assistant

## 2018-02-15 NOTE — Progress Notes (Deleted)
Cardiology Office Note    Date:  02/15/2018  ID:  Travonta Gill, DOB 10/29/1970, MRN [id number] PCP:  [name], MD  Cardiologist:  Tobias Alexander, MD   Chief Complaint: chest pain  History of Present Illness:  Ian [name] is a 47 y.o. 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  In 2016, he presented with a NSTEMI + atrial fibrillation w/ RVR. LHC demonstrated 90% RCA disease, treated with PCI + DES. There was mild residual 25%mid circumflex lesion. EF was 45-50%. He spontaneously converted to NSR but was placed on anticoagulation with Eliquis given a CHA2DS2 VASc score >2. He was on tripple therapy with ASA, Plavix and Eliquis x 30 days. ASA was then discontinued. In January 2018 he had recurrent discomfort and stress test was abnormal. Cardiac cath 09/04/16 showed an 80% mRCA ISR s/p scoring balloon angioplasty. There was also 90% mRCA stenosis just past the stent, but Dr. [name] was unable to cross the lesion with a balloon. The patient underwent staged orbital atherectomy and DES placement (overlapping previous stent) to mid RCA lesion on 09/05/16. Once again, he was on triple therapy x 1 month, then ASA discontinued. His EF was also noted to be mildly reduced at that time at 40-45%.In 04/2017 he was admitted for atrial fib with controlled rate and chest pain. Troponin negative. Discharged in afib with possible plan for outpt DCCV after uninterrupted anticoag given missed doses of Eliquis. Follow up outpatient stress test was low risk without ischemia, EF 45%. At last OV 08/2017 he was felt to be doing well. Dyspnea at work felt due to ongoing tobacco abuse. He was back in NSR by exam. Last labs 04/2017 with trig 291, LDL 57, HDL 29, LFTs ok, normal CBC except platelets chronically mildly low, glucose 171, Cr  1.03.   CAD - change visit dx Cardiomyopathy Paroxymal atrial fibrillation  Essential HTN Hyperlipidemia Tobacco abuse     Past Medical History:  Diagnosis Date  . Arthritis    "left ankle" (09/04/2016)  . Cardiomyopathy (HCC)    a. EF 40-45%  . Chest pain 04/22/2017  . Complication of anesthesia    "hard to get me out of it sometimes; usually takes me longer to get under"  . Coronary artery disease    a. multiple PCIs to RCA.  . Essential hypertension 12/19/2015  . Hyperlipidemia 05/21/2015  . Hypertension   . NSTEMI (non-ST elevated myocardial infarction) (HCC) 05/2015   [name] 05/23/2015 "minor one"  . PAF (paroxysmal atrial fibrillation) (HCC)   . Tobacco abuse 05/21/2015  . Type II diabetes mellitus (HCC)     Past Surgical History:  Procedure Laterality Date  . CARDIAC CATHETERIZATION  2000, 2005   "less than 20% blockage"  . CARDIAC CATHETERIZATION N/A 05/22/2015   Procedure: Left Heart Cath and Coronary Angiography;  Surgeon: [name], MD;  Location: [facility] INVASIVE CV LAB;  Service: Cardiovascular;  Laterality: N/A;  . CARDIAC CATHETERIZATION N/A 05/22/2015   Procedure: Coronary Stent Intervention;  Surgeon: [name], MD;  Location: [facility] INVASIVE CV LAB;  Service: Cardiovascular;  Laterality: N/A;  . CARDIAC CATHETERIZATION N/A 09/04/2016   Procedure: Left Heart Cath and Coronary Angiography;  Surgeon: [name], MD;  Location: [facility] INVASIVE CV LAB;  Service: Cardiovascular;  Laterality: N/A;  . CARDIAC CATHETERIZATION N/A 09/04/2016   Procedure: Coronary Balloon  Angioplasty;  Surgeon: [name], MD;  Location: [facility] INVASIVE CV LAB;  Service: Cardiovascular;  Laterality: N/A;  . CARDIAC CATHETERIZATION N/A 09/05/2016   Procedure: Coronary Atherectomy;  Surgeon: Marykay Lex, MD;  Location: [facility] INVASIVE CV LAB;  Service: Cardiovascular;  Laterality: N/A;  . CARDIAC CATHETERIZATION N/A 09/05/2016   Procedure: Coronary Stent Intervention;   Surgeon: Marykay Lex, MD;  Location: [facility] INVASIVE CV LAB;  Service: Cardiovascular;  Laterality: N/A;  . CARDIAC CATHETERIZATION N/A 09/05/2016   Procedure: Temporary Pacemaker;  Surgeon: Marykay Lex, MD;  Location: [facility] INVASIVE CV LAB;  Service: Cardiovascular;  Laterality: N/A;  . CORONARY ANGIOPLASTY    . MULTIPLE TOOTH EXTRACTIONS  2013   "all of them"  . TYMPANOSTOMY TUBE PLACEMENT Bilateral 1970s    Current Medications: No outpatient medications have been marked as taking for the 02/16/18 encounter (Appointment) with Laurann Montana, [name].   ***   Allergies:   Patient has no known allergies.   Social History   Socioeconomic History  . Marital status: Married    Spouse name: Not on file  . Number of children: Not on file  . Years of education: Not on file  . Highest education level: Not on file  Occupational History  . Not on file  Social Needs  . Financial resource strain: Not on file  . Food insecurity:    Worry: Not on file    Inability: Not on file  . Transportation needs:    Medical: Not on file    Non-medical: Not on file  Tobacco Use  . Smoking status: Current Every Day Smoker    Packs/day: 2.00    Years: 35.00    Pack years: 70.00    Types: Cigarettes, E-cigarettes  . Smokeless tobacco: Never Used  Substance and Sexual Activity  . Alcohol use: Yes    Alcohol/week: 0.0 oz    Comment: 09/04/2016  "might have a few drinks/year; nothing since 05/2015"  . Drug use: Yes    Types: Cocaine, Marijuana    Comment: 09/04/2016 "haven't touched drugs since 1988"  . Sexual activity: Yes  Lifestyle  . Physical activity:    Days per week: Not on file    Minutes per session: Not on file  . Stress: Not on file  Relationships  . Social connections:    Talks on phone: Not on file    Gets together: Not on file    Attends religious service: Not on file    Active member of club or organization: Not on file    Attends meetings of clubs or organizations: Not on file     Relationship status: Not on file  Other Topics Concern  . 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     Family History:  The patient's ***family history includes Diabetes in his maternal grandmother, mother, and sister; Heart attack in his father.  ROS:   Please see the history of present illness. Otherwise, review of systems is positive for ***.  All other systems are reviewed and otherwise negative.    PHYSICAL EXAM:   VS:  There were no vitals taken for this visit.  BMI: There is no height or weight on file to calculate BMI. GEN: Well nourished, well developed, in no acute distress HEENT: normocephalic, atraumatic Neck: no JVD, carotid bruits, or masses Cardiac: ***RRR; no murmurs, rubs, or gallops, no edema  Respiratory:  clear to  auscultation bilaterally, normal work of breathing GI: soft, nontender, nondistended, + BS MS: no deformity or atrophy Skin: warm and dry, no rash Neuro:  Alert and Oriented x 3, Strength and sensation are intact, follows commands Psych: euthymic mood, full affect  Wt Readings from Last 3 Encounters:  08/18/17 273 lb (123.8 kg)  05/05/17 272 lb (123.4 kg)  04/22/17 272 lb 4.8 oz (123.5 kg)      Studies/Labs Reviewed:   EKG:  EKG was ordered today and personally reviewed by me and demonstrates *** EKG was not ordered today.***  Recent Labs: 04/21/2017: BUN 12; Creatinine, Ser 1.03; Hemoglobin 15.8; Platelets 145; Potassium 3.9; Sodium 136 05/05/2017: ALT 27   Lipid Panel    Component Value Date/Time   CHOL 144 05/05/2017 0744   TRIG 291 (H) 05/05/2017 0744   HDL 29 (L) 05/05/2017 0744   CHOLHDL 5.0 05/05/2017 0744   CHOLHDL 7.9 05/22/2015 0233   VLDL UNABLE TO CALCULATE IF TRIGLYCERIDE OVER 400 mg/dL 16/10/960410/05/2015 54090233   LDLCALC 57 05/05/2017 0744    Additional studies/ records that were reviewed today include: Summarized above.***    ASSESSMENT & PLAN:    1. ***  Disposition: F/u with ***   Medication Adjustments/Labs and Tests Ordered: Current medicines are reviewed at length with the patient today.  Concerns regarding medicines are outlined above. Medication changes, Labs and Tests ordered today are summarized above and listed in the Patient Instructions accessible in Encounters.   Signed, Laurann Montanaayna N Ameena Vesey, [name]  02/15/2018 8:05 AM    [facility] [address], [place], [place]  [id number] Phone: [phone]; Fax: [phone]

## 2018-02-16 ENCOUNTER — Ambulatory Visit: Payer: 59 | Admitting: Physician Assistant

## 2018-02-19 ENCOUNTER — Ambulatory Visit: Payer: 59 | Admitting: Physician Assistant

## 2018-03-06 ENCOUNTER — Other Ambulatory Visit: Payer: Self-pay | Admitting: Cardiology

## 2018-03-06 ENCOUNTER — Other Ambulatory Visit: Payer: Self-pay | Admitting: Physician Assistant

## 2018-03-06 MED ORDER — APIXABAN 5 MG PO TABS: 5.0000 mg | ORAL_TABLET | Freq: Two times a day (BID) | ORAL | 1 refills | Status: DC

## 2018-03-06 MED ORDER — METOPROLOL TARTRATE 25 MG PO TABS: 12.5000 mg | ORAL_TABLET | Freq: Two times a day (BID) | ORAL | 1 refills | Status: DC

## 2018-03-06 NOTE — Telephone Encounter (Signed)
New Message    *STAT* If patient is at the pharmacy, call can be transferred to refill team.   1. Which medications need to be refilled? (please list name of each medication and dose if known) apixaban (ELIQUIS) 5 MG TABS tablet and metoprolol tartrate (LOPRESSOR) 25 MG tablet  2. Which pharmacy/location (including street and city if local pharmacy) is medication to be sent to? [facility] [address] - [place], [place] - [address]  3. Do they need a 30 day or 90 day supply? 90

## 2018-03-06 NOTE — Telephone Encounter (Signed)
Pt last saw Vin on 08/18/17, last labs 04/21/17 Creat 1.03, age 47, weight 123.8kg, based on specified criteria pt is on appropriate dosage of Eliquis 5mg  BID.  Will refill rx.

## 2018-03-06 NOTE — Telephone Encounter (Signed)
Pt last saw Vin 08/18/17, last labs 04/21/17 Creat 1.03, age 47, weight 123.8kg, based on specified criteria pt is on appropriate dosage of Eliquis 5mg  BID.  Will refill rx.

## 2018-03-17 ENCOUNTER — Telehealth: Payer: Self-pay | Admitting: Cardiology

## 2018-03-17 NOTE — Telephone Encounter (Signed)
Pt is calling today to let us know he was having intermittent chest pain on 8/1. He took 2 nitro tabs and went to [facility]. He stayed over night for observation. He states his stress test and labs were normal. He was ruled out for a NSTEMI and unstable angina. He is planning on keeping his visit on 8/20 with Herma CarsonMichelle Lenze. He would like Dr Delton SeeNelson to know he was recently hospitalized but still is worried about not feeling his best since his heart attack in 2016.   Pt was advised to go to ED with unrelieved CP. Otherwise, be sure to keep his appointment with Herma CarsonMichelle Lenze.   I advised him that Dr Lindaann SloughNelson's RN is out of town at this time, but I would forward this to her and Dr Delton SeeNelson for additional recommendation.

## 2018-03-17 NOTE — Telephone Encounter (Signed)
New Message        Pt c/o of Chest Pain: 1. Are you having CP right now? No  2. Are you experiencing any other symptoms (ex. SOB, nausea, vomiting, sweating)? SOB/ nausea 3. How long have you been experiencing CP? Off and On 4. Is your CP continuous or coming and going? Yes, coming and going 5. Have you taken Nitroglycerin? Yes    Patient was hospitalized 03/12/2018. Patient is still experiencing chest pains off and on. As of now no chest pains.

## 2018-03-31 ENCOUNTER — Encounter: Payer: Self-pay | Admitting: Physician Assistant

## 2018-03-31 ENCOUNTER — Ambulatory Visit: Payer: 59 | Admitting: Physician Assistant

## 2018-03-31 ENCOUNTER — Other Ambulatory Visit: Payer: Self-pay | Admitting: Physician Assistant

## 2018-03-31 ENCOUNTER — Ambulatory Visit (INDEPENDENT_AMBULATORY_CARE_PROVIDER_SITE_OTHER): Payer: 59

## 2018-03-31 VITALS — BP 110/80 | HR 73 | Ht 71.0 in | Wt 270.0 lb

## 2018-03-31 DIAGNOSIS — Z720 Tobacco use: Secondary | ICD-10-CM

## 2018-03-31 DIAGNOSIS — I480 Paroxysmal atrial fibrillation: Secondary | ICD-10-CM

## 2018-03-31 DIAGNOSIS — I2510 Atherosclerotic heart disease of native coronary artery without angina pectoris: Secondary | ICD-10-CM | POA: Diagnosis not present

## 2018-03-31 DIAGNOSIS — I10 Essential (primary) hypertension: Secondary | ICD-10-CM | POA: Diagnosis not present

## 2018-03-31 DIAGNOSIS — I255 Ischemic cardiomyopathy: Secondary | ICD-10-CM

## 2018-03-31 DIAGNOSIS — E782 Mixed hyperlipidemia: Secondary | ICD-10-CM

## 2018-03-31 DIAGNOSIS — I2583 Coronary atherosclerosis due to lipid rich plaque: Secondary | ICD-10-CM

## 2018-03-31 MED ORDER — METOPROLOL TARTRATE 25 MG PO TABS: 25.0000 mg | ORAL_TABLET | Freq: Two times a day (BID) | ORAL | 3 refills | Status: DC

## 2018-03-31 NOTE — Progress Notes (Signed)
Cardiology Office Note    Date:  03/31/2018   ID:  Ian [name], DOB 1971-08-07, MRN [id number]  PCP:  [name], [name], MD  Cardiologist: Tobias AlexanderKatarina [name], MD  Chief Complaint  Patient presents with  . Hospitalization Follow-up    History of Present Illness:  Ian [name] is a 47 y.o. male with history of CAD status post NSTEMI 2016 treated with DES to the RCA with residual 25% mid circumflex LVEF 45 to 50%.  He also had A. fib with RVR at that time with CHA2DS2-VASc equal to 2 and was on aspirin Plavix and Eliquis for 30 days and then aspirin was stopped.  Patient had abnormal stress test in 08/2016 for possible inferior ischemia and underwent cardiac catheterization which showed 80% in-stent restenosis of the RCA and underwent balloon angioplasty.  There was also 90% mid RCA unable to cross the lesion with the balloon so underwent staged orbital atherectomy and DES placement 09/05/2016.  EF 40 to 45% at that time.  Low risk Myoview 04/2017.  Patient also has hypertension, HLD, type 2 diabetes mellitus, tobacco abuse, obesity.   Patient called in stating he was having intermittent chest pain 03/12/2018 and was [facility].  Per patient report he had a stress test and labs that were normal and ruled out for an MI.  Chart reviewed in care everywhere.  Troponin negative BNP 40 CTA negative EKG atrial fibrillation 120 PEEP per minute.  Received Lasix in the ED.  Lexiscan no ischemia prior MI versus attenuation.  It was not gated so they could not distinguish.  Patient comes in for post hospital f/u accompanied by his wife. Still having continuous chest pain-always there since 2016. He went to ER because he became dizzy and felt like he may pass out. Works in the heat.  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  He is working in the heat of the day.  He has to stop and rest and sit down.  He eats terrible.   His last hemoglobin A1c was over 8 and he continues to smoke a pack of cigarettes a day.   Past Medical History:  Diagnosis Date  . Arthritis    "left ankle" (09/04/2016)  . Cardiomyopathy (HCC)    a. EF 40-45%  . Chest pain 04/22/2017  . Complication of anesthesia    "hard to get me out of it sometimes; usually takes me longer to get under"  . Coronary artery disease    a. multiple PCIs to RCA.  . Essential hypertension 12/19/2015  . Hyperlipidemia 05/21/2015  . Hypertension   . NSTEMI (non-ST elevated myocardial infarction) (HCC) 05/2015   [name]/notes 05/23/2015 "minor one"  . PAF (paroxysmal atrial fibrillation) (HCC)   . Tobacco abuse 05/21/2015  . Type II diabetes mellitus (HCC)     Past Surgical History:  Procedure Laterality Date  . CARDIAC CATHETERIZATION  2000, 2005   "less than 20% blockage"  . CARDIAC CATHETERIZATION N/A 05/22/2015   Procedure: Left Heart Cath and Coronary Angiography;  Surgeon: [name], MD;  [facility];  Service: Cardiovascular;  Laterality: N/A;  . CARDIAC CATHETERIZATION N/A 05/22/2015   Procedure: Coronary Stent Intervention;  Surgeon: [name], MD;  [facility];  Service: Cardiovascular;  Laterality: N/A;  . CARDIAC CATHETERIZATION N/A 09/04/2016   Procedure: Left Heart Cath and Coronary Angiography;  Surgeon: Renelda LomaJayadeep  Hoyle BarrS Varanasi, MD;  Location: [facility] INVASIVE CV LAB;  Service: Cardiovascular;  Laterality: N/A;  . CARDIAC CATHETERIZATION N/A 09/04/2016   Procedure: Coronary Balloon Angioplasty;  Surgeon: [name], MD;  Location: [facility] INVASIVE CV LAB;  Service: Cardiovascular;  Laterality: N/A;  . CARDIAC CATHETERIZATION N/A 09/05/2016   Procedure: Coronary Atherectomy;  Surgeon: [name], MD;  [facility];  Service: Cardiovascular;  Laterality: N/A;  . CARDIAC CATHETERIZATION N/A 09/05/2016   Procedure: Coronary Stent Intervention;  Surgeon: [name], MD;  [facility];  Service: Cardiovascular;  Laterality: N/A;  . CARDIAC CATHETERIZATION N/A 09/05/2016   Procedure: Temporary Pacemaker;  Surgeon: [name], MD;  [facility];  Service: Cardiovascular;  Laterality: N/A;  . CORONARY ANGIOPLASTY    . MULTIPLE TOOTH EXTRACTIONS  2013   "all of them"  . TYMPANOSTOMY TUBE PLACEMENT Bilateral 1970s    Current Medications: Current Meds  Medication Sig  . atorvastatin (LIPITOR) 80 MG tablet Take 1 tablet (80 mg total) by mouth daily at 6 PM.  . clopidogrel (PLAVIX) 75 MG tablet TAKE 1 TABLET BY MOUTH ONCE DAILY  . dapagliflozin propanediol (FARXIGA) 5 MG TABS tablet Take 5 mg by mouth daily.  Marland Kitchen. diltiazem (CARDIZEM CD) 120 MG 24 hr capsule Take 1 capsule (120 mg total) by mouth daily.  Marland Kitchen. ELIQUIS 5 MG TABS tablet TAKE 1 TABLET BY MOUTH TWICE DAILY  . fenofibrate (TRICOR) 48 MG tablet Take 1 tablet (48 mg total) by mouth daily.  . furosemide (LASIX) 20 MG tablet TAKE 1 TABLET BY MOUTH ONCE DAILY  . glipiZIDE (GLUCOTROL) 5 MG tablet Take 5-10 mg by mouth See admin instructions. 10mg  in the am and 5mg  in the evening  . lisinopril (PRINIVIL,ZESTRIL) 2.5 MG tablet Take 1 tablet (2.5 mg total) by mouth daily.  . metFORMIN (GLUCOPHAGE) 500 MG tablet Take 1 tablet (500 mg total) by mouth 2 (two) times daily with a meal.  . Multiple Vitamin (MULTIVITAMIN) tablet Take 1 tablet by mouth daily.  . nitroGLYCERIN (NITROSTAT) 0.4 MG SL tablet Place 1 tablet (0.4 mg total) under the tongue every 5 (five) minutes x 3 doses as needed for chest pain.  . Omega-3 Fatty Acids (FISH OIL) 1200 MG CAPS Take 1,200 mg by mouth daily.   . [DISCONTINUED] metoprolol tartrate (LOPRESSOR) 25 MG tablet Take 0.5 tablets (12.5 mg total) by mouth 2 (two) times daily.     Allergies:   Patient has no known allergies.   Social History   Socioeconomic History  . Marital status: Married    Spouse name: Not on file  . Number of children: Not on file  .  Years of education: Not on file  . Highest education level: Not on file  Occupational History  . Not on file  Social Needs  . Financial resource strain: Not on file  . Food insecurity:    Worry: Not on file    Inability: Not on file  . Transportation needs:    Medical: Not on file    Non-medical: Not on file  Tobacco Use  . Smoking status: Current Every Day Smoker    Packs/day: 2.00    Years: 35.00    Pack years: 70.00    Types: Cigarettes, E-cigarettes  . Smokeless tobacco: Never Used  Substance and Sexual Activity  . Alcohol use: Yes    Alcohol/week: 0.0 standard drinks    Comment: 09/04/2016  "might have a few  drinks/year; nothing since 05/2015"  . Drug use: Yes    Types: Cocaine, Marijuana    Comment: 09/04/2016 "haven't touched drugs since 1988"  . Sexual activity: Yes  Lifestyle  . Physical activity:    Days per week: Not on file    Minutes per session: Not on file  . Stress: Not on file  Relationships  . Social connections:    Talks on phone: Not on file    Gets together: Not on file    Attends religious service: Not on file    Active member of club or organization: Not on file    Attends meetings of clubs or organizations: Not on file    Relationship status: Not on file  Other Topics Concern  . 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     Family History:  The patient's family history includes Diabetes in his maternal grandmother, mother, and sister; Heart attack in his father.   ROS:   Please see the history of present illness.    Review of Systems  Constitution: Positive for malaise/fatigue.  HENT: Negative.   Cardiovascular: Positive for chest pain, dyspnea on exertion and irregular heartbeat.  Respiratory: Positive for shortness of breath.   Endocrine: Negative.   Hematologic/Lymphatic: Negative.   Musculoskeletal: Negative.   Gastrointestinal: Negative.   Genitourinary: Negative.     Neurological: Negative.    All other systems reviewed and are negative.   PHYSICAL EXAM:   VS:  BP 110/80   Pulse 73   Ht 5\' 11"  (1.803 m)   Wt 270 lb (122.5 kg)   SpO2 97%   BMI 37.66 kg/m   Physical Exam  GEN: Obese, in no acute distress  Neck: no JVD, carotid bruits, or masses Cardiac:RRR; as of S4 no murmurs, rubs  Respiratory: Decreased breath sounds GI: soft, nontender, nondistended, + BS Ext: without cyanosis, clubbing, or edema, Good distal pulses bilaterally Neuro:  Alert and Oriented x 3 Psych: euthymic mood, full affect  Wt Readings from Last 3 Encounters:  03/31/18 270 lb (122.5 kg)  08/18/17 273 lb (123.8 kg)  05/05/17 272 lb (123.4 kg)      Studies/Labs Reviewed:   EKG:  EKG is ordered today.  The ekg ordered today demonstrates atrial fibrillation at 91 bpm  Recent Labs: 04/21/2017: BUN 12; Creatinine, Ser 1.03; Hemoglobin 15.8; Platelets 145; Potassium 3.9; Sodium 136 05/05/2017: ALT 27   Lipid Panel    Component Value Date/Time   CHOL 144 05/05/2017 0744   TRIG 291 (H) 05/05/2017 0744   HDL 29 (L) 05/05/2017 0744   CHOLHDL 5.0 05/05/2017 0744   CHOLHDL 7.9 05/22/2015 0233   VLDL UNABLE TO CALCULATE IF TRIGLYCERIDE OVER 400 mg/dL 16/05/9603 5409   LDLCALC 57 05/05/2017 0744    Additional studies/ records that were reviewed today include:  CTA 03/12/2018 CTA CHEST W & W/O IV CONTRAST  Collection Time: 03/12/18 5:17 PM  Narrative  Study: Chest CT with CTA reconstructed coronal images  Indication: Short of breath  Thin helical axial sections were obtained through the chest during bolus administration of IV contrast 100 cc Omnipaque 350. Multi-planar maximum intensity projection and volume rendered images were created from the data set. There is no mediastinal or hilar mass. There is a mildly prominent right hilar lymph node measuring 2.3 cm. Pulmonary arteries are adequately opacified. No defects to suggest embolus. The lung parenchyma is clear.  There is a small  5 mm nodular density within the right mid lung image 28/70 Limit views of the upper abdomen are unremarkable.  Impression  Impression:  No evidence for pulmonary emboli.  Mildly prominent right hilar lymph node and small right lung nodule nonspecific. Follow-up could be obtained to assure stability.  Reading Doctor: Ival Bible  Electronic Signature by: Otelia Santee Myoview 8/1/2019Final Impression: 1. No definite evidence of Lexiscan induced myocardial ischemia.  2. Scintigraphic evidence of a prior myocardial infarction versus attenuation artifact in a moderate size area located at the inferior and inferoapical walls of the left ventricle. Unfortunately, since the study was not gated, it is impossible to make that distinction. Clinical correlation required. 3. The study was not gated, therefore ejection fraction was not calculated.   ASSESSMENT:    1. Coronary artery disease due to lipid rich plaque   2. PAF (paroxysmal atrial fibrillation) (HCC)   3. Ischemic cardiomyopathy   4. Essential hypertension   5. Mixed hyperlipidemia   6. Tobacco abuse      PLAN:  In order of problems listed above:  CAD with recent hospitalization at [facility] for chest pain.  Lexiscan negative for ischemia.  CTA negative for PE.  Patient was given Lasix in the emergency room.  History of end STEMI 2016 treated with DES to the RCA LVEF 45 to 50%.  Balloon angioplasty to the RCA for 80% in-stent restenosis 2018 also had staged orbital atherectomy and DES to the mid RCA 09/05/2016 LVEF 40 to 45%.  Low risk Myoview 04/2017.  Patient has constant chest pain.  I wonder if his atrial fibrillation with uncontrolled rates is contributing to this.  He could also have microvascular angina.  Will increase metoprolol to 25 mg twice daily.  Check 14-day monitor and reassess.  May need to add Imdur or Ranexa.  He may also need repeat cardiac catheterization in the future because of his  ongoing symptoms.  Smoking cessation and lifestyle modifications discussed in detail and essential for his overall health.  Persistent atrial fibrillation rates were 120 when he went to the ER, 91 at rest.  He does wear a fit bit and sometimes it gets up to 130 bpm when he is driving his truck.  Increase metoprolol to 25 mg twice daily, continue diltiazem and Eliquis.  Ischemic cardiomyopathy no evidence of heart failure on exam today.  LVEF 40 to 45% last time it was checked in 08/2016  Hypertension blood pressure controlled  Mixed hyperlipidemia LDL was at goal on Lipitor last time it was checked  Tobacco abuse stressed how essential it is for him to quit smoking    Medication Adjustments/Labs and Tests Ordered: Current medicines are reviewed at length with the patient today.  Concerns regarding medicines are outlined above.  Medication changes, Labs and Tests ordered today are listed in the Patient Instructions below. Patient Instructions  Medication Instructions:  Your physician has recommended you make the following change in your medication:  1. INCREASE METOPROLOL TO 25 MG TWICE DAILY.    Labwork: None ordered   Testing/Procedures: Your physician has recommended that you wear a 14- DAY ZIO XT LONG TERM holter monitor. Holter monitors are medical devices that record the heart's electrical activity. Doctors most often use these monitors to diagnose arrhythmias. Arrhythmias are problems with the speed or rhythm of the heartbeat. The monitor is a small, portable device. You can wear one while you do your normal daily activities. This is usually used to diagnose what is  causing palpitations/syncope (passing out).   Follow-Up: Your physician wants you to follow-up in: 3 WEEKS with Dr. [name].   Any Other Special Instructions Will Be Listed Below (If Applicable).     If you need a refill on your cardiac medications before your next appointment, please call your pharmacy.       Signed, Jacolyn Reedy, [name]  03/31/2018 1:23 PM    [facility] [address] [place], [place], [place]  [address] Phone: [phone]; Fax: [phone]

## 2018-03-31 NOTE — Patient Instructions (Addendum)
Medication Instructions:  Your physician has recommended you make the following change in your medication:  1. INCREASE METOPROLOL TO 25 MG TWICE DAILY.    Labwork: None ordered   Testing/Procedures: Your physician has recommended that you wear a 14- DAY ZIO XT LONG TERM holter monitor. Holter monitors are medical devices that record the heart's electrical activity. Doctors most often use these monitors to diagnose arrhythmias. Arrhythmias are problems with the speed or rhythm of the heartbeat. The monitor is a small, portable device. You can wear one while you do your normal daily activities. This is usually used to diagnose what is causing palpitations/syncope (passing out).   Follow-Up: Your physician wants you to follow-up in: 3 WEEKS with Dr. Delton SeeNELSON.   Any Other Special Instructions Will Be Listed Below (If Applicable).     If you need a refill on your cardiac medications before your next appointment, please call your pharmacy.

## 2018-04-23 ENCOUNTER — Telehealth: Payer: Self-pay | Admitting: Physician Assistant

## 2018-04-23 MED ORDER — METOPROLOL TARTRATE 50 MG PO TABS: 50.0000 mg | ORAL_TABLET | Freq: Two times a day (BID) | ORAL | 3 refills | Status: DC

## 2018-04-23 NOTE — Telephone Encounter (Signed)
Patient calling for monitor results. 

## 2018-04-23 NOTE — Telephone Encounter (Signed)
-----   Message from Dyann KiefMichele M Lenze, [name] sent at 04/22/2018  7:43 AM EDT ----- Can you make sure patient increased his metoprolol to 50 mg twice daily.  Holter monitor showed atrial fibrillation with RVR.  Has follow-up with Dr. Delton SeeNelson 9/23

## 2018-04-23 NOTE — Telephone Encounter (Signed)
Patient made aware of his results. Instructed patient to increase his metoprolol to 50 mg BID. Rx sent to preferred pharmacy. Patient will keep appointment scheduled with Dr. Delton SeeNelson on 9/23.

## 2018-05-04 ENCOUNTER — Ambulatory Visit: Payer: 59 | Admitting: Cardiology

## 2018-05-04 ENCOUNTER — Encounter: Payer: Self-pay | Admitting: Cardiology

## 2018-05-04 VITALS — BP 100/72 | HR 95 | Ht 71.0 in | Wt 274.0 lb

## 2018-05-04 DIAGNOSIS — R079 Chest pain, unspecified: Secondary | ICD-10-CM

## 2018-05-04 DIAGNOSIS — I10 Essential (primary) hypertension: Secondary | ICD-10-CM | POA: Diagnosis not present

## 2018-05-04 DIAGNOSIS — I2510 Atherosclerotic heart disease of native coronary artery without angina pectoris: Secondary | ICD-10-CM

## 2018-05-04 DIAGNOSIS — I739 Peripheral vascular disease, unspecified: Secondary | ICD-10-CM

## 2018-05-04 DIAGNOSIS — I2583 Coronary atherosclerosis due to lipid rich plaque: Secondary | ICD-10-CM

## 2018-05-04 DIAGNOSIS — I480 Paroxysmal atrial fibrillation: Secondary | ICD-10-CM | POA: Diagnosis not present

## 2018-05-04 DIAGNOSIS — E785 Hyperlipidemia, unspecified: Secondary | ICD-10-CM

## 2018-05-04 NOTE — Progress Notes (Signed)
Cardiology Office Note    Date:  05/04/2018   ID:  Story Vanvranken, DOB 09/29/1970, MRN [id number]  PCP:  [name], MD  Cardiologist: Tobias Alexander, MD  Chief complain: LLE edema  History of Present Illness:  Ian [name] is a 47 y.o. male with history of CAD status post NSTEMI 2016 treated with DES to the RCA with residual 25% mid circumflex LVEF 45 to 50%.  He also had A. fib with RVR at that time with CHA2DS2-VASc equal to 2 and was on aspirin Plavix and Eliquis for 30 days and then aspirin was stopped.  Patient had abnormal stress test in 08/2016 for possible inferior ischemia and underwent cardiac catheterization which showed 80% in-stent restenosis of the RCA and underwent balloon angioplasty.  There was also 90% mid RCA unable to cross the lesion with the balloon so underwent staged orbital atherectomy and DES placement 09/05/2016.  EF 40 to 45% at that time.  Low risk Myoview 04/2017.  Patient also has hypertension, HLD, type 2 diabetes mellitus, tobacco abuse, obesity.  Patient called in stating he was having intermittent chest pain 03/12/2018 and was admitted overnight at [facility].  Per patient report he had a stress test and labs that were normal and ruled out for an MI.  Chart reviewed in care everywhere.  Troponin negative BNP 40 CTA negative for pulmonary embolism, EKG atrial fibrillation 120 beats per minute.  Received Lasix in the ED.  Lexiscan no ischemia prior MI versus attenuation.  It was not gated so they could not distinguish.  Patient comes in for post hospital f/u accompanied by his wife. Still having continuous chest pain-always there since 2016. He went to ER because he became dizzy and felt like he may pass out. Works in the heat.  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  He is working in the heat of the day.  He has to stop and rest and sit down.  He eats terrible.  His last hemoglobin A1c  was over 8 and he continues to smoke a pack of cigarettes a day.  05/04/2018 -patient is coming after 1 months, he underwent Holter monitoring that showed atrial fibrillation 80% of the monitoring time a lot of times with RVR, his metoprolol was increased to 50 mg p.o. twice daily and since then his chest pain has resolved.  He has noticed mild lower extremity edema left more than right, that has now resolved and he attributes it to the arthritis in his left foot.  Past Medical History:  Diagnosis Date  . Arthritis    "left ankle" (09/04/2016)  . Cardiomyopathy (HCC)    a. EF 40-45%  . Chest pain 04/22/2017  . Complication of anesthesia    "hard to get me out of it sometimes; usually takes me longer to get under"  . Coronary artery disease    a. multiple PCIs to RCA.  . Essential hypertension 12/19/2015  . Hyperlipidemia 05/21/2015  . Hypertension   . NSTEMI (non-ST elevated myocardial infarction) (HCC) 05/2015   [name] 05/23/2015 "minor one"  . PAF (paroxysmal atrial fibrillation) (HCC)   . Tobacco abuse 05/21/2015  . Type II diabetes mellitus (HCC)     Past Surgical History:  Procedure Laterality Date  . CARDIAC CATHETERIZATION  2000, 2005   "less than 20% blockage"  . CARDIAC CATHETERIZATION N/A 05/22/2015   Procedure: Left Heart Cath and Coronary Angiography;  Surgeon: [name], MD;  Location: [facility] INVASIVE CV LAB;  Service: Cardiovascular;  Laterality: N/A;  . CARDIAC CATHETERIZATION N/A 05/22/2015   Procedure: Coronary Stent Intervention;  Surgeon: [name], MD;  Location: [facility] INVASIVE CV LAB;  Service: Cardiovascular;  Laterality: N/A;  . CARDIAC CATHETERIZATION N/A 09/04/2016   Procedure: Left Heart Cath and Coronary Angiography;  Surgeon: [name], MD;  Location: [facility] INVASIVE CV LAB;  Service: Cardiovascular;  Laterality: N/A;  . CARDIAC CATHETERIZATION N/A 09/04/2016   Procedure: Coronary Balloon Angioplasty;  Surgeon: [name], MD;   Location: [facility] INVASIVE CV LAB;  Service: Cardiovascular;  Laterality: N/A;  . CARDIAC CATHETERIZATION N/A 09/05/2016   Procedure: Coronary Atherectomy;  Surgeon: Marykay Lex, MD;  Location: [facility] INVASIVE CV LAB;  Service: Cardiovascular;  Laterality: N/A;  . CARDIAC CATHETERIZATION N/A 09/05/2016   Procedure: Coronary Stent Intervention;  Surgeon: Marykay Lex, MD;  Location: [facility] INVASIVE CV LAB;  Service: Cardiovascular;  Laterality: N/A;  . CARDIAC CATHETERIZATION N/A 09/05/2016   Procedure: Temporary Pacemaker;  Surgeon: Marykay Lex, MD;  Location: [facility] INVASIVE CV LAB;  Service: Cardiovascular;  Laterality: N/A;  . CORONARY ANGIOPLASTY    . MULTIPLE TOOTH EXTRACTIONS  2013   "all of them"  . TYMPANOSTOMY TUBE PLACEMENT Bilateral 1970s    Current Medications: Current Meds  Medication Sig  . atorvastatin (LIPITOR) 80 MG tablet Take 1 tablet (80 mg total) by mouth daily at 6 PM.  . clopidogrel (PLAVIX) 75 MG tablet TAKE 1 TABLET BY MOUTH ONCE DAILY  . dapagliflozin propanediol (FARXIGA) 5 MG TABS tablet Take 5 mg by mouth daily.  Marland Kitchen diltiazem (CARDIZEM CD) 120 MG 24 hr capsule Take 1 capsule (120 mg total) by mouth daily.  Marland Kitchen ELIQUIS 5 MG TABS tablet TAKE 1 TABLET BY MOUTH TWICE DAILY  . fenofibrate (TRICOR) 48 MG tablet Take 1 tablet (48 mg total) by mouth daily.  . furosemide (LASIX) 20 MG tablet TAKE 1 TABLET BY MOUTH ONCE DAILY  . glipiZIDE (GLUCOTROL) 5 MG tablet Take 5-10 mg by mouth See admin instructions. 10mg  in the am and 5mg  in the evening  . lisinopril (PRINIVIL,ZESTRIL) 2.5 MG tablet Take 1 tablet (2.5 mg total) by mouth daily.  . metFORMIN (GLUCOPHAGE) 500 MG tablet Take 1 tablet (500 mg total) by mouth 2 (two) times daily with a meal.  . metoprolol tartrate (LOPRESSOR) 50 MG tablet Take 1 tablet (50 mg total) by mouth 2 (two) times daily.  . Multiple Vitamin (MULTIVITAMIN) tablet Take 1 tablet by mouth daily.  . nitroGLYCERIN (NITROSTAT) 0.4 MG SL tablet Place 1 tablet  (0.4 mg total) under the tongue every 5 (five) minutes x 3 doses as needed for chest pain.  . Omega-3 Fatty Acids (FISH OIL) 1200 MG CAPS Take 1,200 mg by mouth daily.      Allergies:   Patient has no known allergies.   Social History   Socioeconomic History  . Marital status: Married    Spouse name: Not on file  . Number of children: Not on file  . Years of education: Not on file  . Highest education level: Not on file  Occupational History  . Not on file  Social Needs  . Financial resource strain: Not on file  . Food insecurity:    Worry: Not on file    Inability: Not on file  . Transportation needs:    Medical: Not on file    Non-medical: Not on file  Tobacco  Use  . Smoking status: Current Every Day Smoker    Packs/day: 2.00    Years: 35.00    Pack years: 70.00    Types: Cigarettes, E-cigarettes  . Smokeless tobacco: Never Used  Substance and Sexual Activity  . Alcohol use: Yes    Alcohol/week: 0.0 standard drinks    Comment: 09/04/2016  "might have a few drinks/year; nothing since 05/2015"  . Drug use: Yes    Types: Cocaine, Marijuana    Comment: 09/04/2016 "haven't touched drugs since 1988"  . Sexual activity: Yes  Lifestyle  . Physical activity:    Days per week: Not on file    Minutes per session: Not on file  . Stress: Not on file  Relationships  . Social connections:    Talks on phone: Not on file    Gets together: Not on file    Attends religious service: Not on file    Active member of club or organization: Not on file    Attends meetings of clubs or organizations: Not on file    Relationship status: Not on file  Other Topics Concern  . 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     Family History:  The patient's family history includes Diabetes in his maternal grandmother, mother, and sister; Heart attack in his father.   ROS:   Please see the history of present illness.    Review of Systems   Constitution: Positive for malaise/fatigue.  HENT: Negative.   Cardiovascular: Positive for chest pain, dyspnea on exertion and irregular heartbeat.  Respiratory: Positive for shortness of breath.   Endocrine: Negative.   Hematologic/Lymphatic: Negative.   Musculoskeletal: Negative.   Gastrointestinal: Negative.   Genitourinary: Negative.   Neurological: Negative.    All other systems reviewed and are negative.   PHYSICAL EXAM:   VS:  BP 100/72   Pulse 95   Ht 5\' 11"  (1.803 m)   Wt 274 lb (124.3 kg)   SpO2 98%   BMI 38.22 kg/m   Physical Exam  GEN: Obese, in no acute distress  Neck: no JVD, carotid bruits, or masses Cardiac:RRR; as of S4 no murmurs, rubs  Respiratory: Decreased breath sounds GI: soft, nontender, nondistended, + BS Ext: without cyanosis, clubbing, or edema, weak pulse in posterior tibialis and dorsalis pedis on the left. Neuro:  Alert and Oriented x 3 Psych: euthymic mood, full affect  Wt Readings from Last 3 Encounters:  05/04/18 274 lb (124.3 kg)  03/31/18 270 lb (122.5 kg)  08/18/17 273 lb (123.8 kg)      Studies/Labs Reviewed:   EKG:  EKG is ordered today.  The ekg ordered today demonstrates atrial fibrillation at 91 bpm  Recent Labs: 05/05/2017: ALT 27   Lipid Panel    Component Value Date/Time   CHOL 144 05/05/2017 0744   TRIG 291 (H) 05/05/2017 0744   HDL 29 (L) 05/05/2017 0744   CHOLHDL 5.0 05/05/2017 0744   CHOLHDL 7.9 05/22/2015 0233   VLDL UNABLE TO CALCULATE IF TRIGLYCERIDE OVER 400 mg/dL 40/98/1191 4782   LDLCALC 57 05/05/2017 0744    Additional studies/ records that were reviewed today include:  CTA 03/12/2018 CTA CHEST W & W/O IV CONTRAST  Collection Time: 03/12/18 5:17 PM  Narrative  Study: Chest CT with CTA reconstructed coronal images  Indication: Short of breath  Thin helical axial sections were obtained through the chest during bolus administration of IV contrast 100  cc Omnipaque 350. Multi-planar maximum intensity  projection and volume rendered images were created from the data set. There is no mediastinal or hilar mass. There is a mildly prominent right hilar lymph node measuring 2.3 cm. Pulmonary arteries are adequately opacified. No defects to suggest embolus. The lung parenchyma is clear. There is a small 5 mm nodular density within the right mid lung image 28/70 Limit views of the upper abdomen are unremarkable.  Impression  Impression:  No evidence for pulmonary emboli.  Mildly prominent right hilar lymph node and small right lung nodule nonspecific. Follow-up could be obtained to assure stability.  Reading Doctor: Ival BibleVithalani, Roger  Electronic Signature by: Otelia SanteeVithalani, Roger   Lexiscan Myoview 8/1/2019Final Impression: 1. No definite evidence of Lexiscan induced myocardial ischemia.  2. Scintigraphic evidence of a prior myocardial infarction versus attenuation artifact in a moderate size area located at the inferior and inferoapical walls of the left ventricle. Unfortunately, since the study was not gated, it is impossible to make that distinction. Clinical correlation required. 3. The study was not gated, therefore ejection fraction was not calculated.   ASSESSMENT:    1. PAF (paroxysmal atrial fibrillation) (HCC)   2. Coronary artery disease due to lipid rich plaque   3. Essential hypertension   4. Hyperlipidemia, unspecified hyperlipidemia type   5. Claudication (HCC)   6. Chest pain with moderate risk for cardiac etiology      PLAN:  In order of problems listed above:  CAD with recent hospitalization at [facility] for chest pain.  Lexiscan negative for ischemia.  CTA negative for PE.  Patient was given Lasix in the emergency room.  History of end STEMI 2016 treated with DES to the RCA LVEF 45 to 50%.  Balloon angioplasty to the RCA for 80% in-stent restenosis 2018 also had staged orbital atherectomy and DES to the mid RCA 09/05/2016 LVEF 40 to 45%.  Patient chest pain resolved with heart  rate control, he is baseline heart rate still 95 bpm however there is no room to uptitrate metoprolol.  Persistent atrial fibrillation -improved ventricular rates with increased metoprolol dose., he is also advised to hydrate well.  Ischemic cardiomyopathy no evidence of heart failure on exam today.  LVEF 40 to 45% last time it was checked in 08/2016, we will continue metoprolol and lisinopril.  No room for uptitrating.  Hypertension blood pressure controlled  Mixed hyperlipidemia LDL was at goal on Lipitor last time it was checked  Tobacco abuse stressed how essential it is for him to quit smoking  Left lower extremity edema -poor pulses, will obtain arterial duplex, DVT low on differential given chronic Eliquis use.  He is compliant.  Follow-up in 3 months.  Medication Adjustments/Labs and Tests Ordered: Current medicines are reviewed at length with the patient today.  Concerns regarding medicines are outlined above.  Medication changes, Labs and Tests ordered today are listed in the Patient Instructions below. There are no Patient Instructions on file for this visit.   Signed, Tobias AlexanderKatarina Jones Viviani, MD  05/04/2018 4:04 PM    [facility] [address], [place], [place]  [address] Phone: [phone]; Fax: [phone]

## 2018-05-04 NOTE — Patient Instructions (Signed)
Medication Instructions:   Your physician recommends that you continue on your current medications as directed. Please refer to the Current Medication list given to you today.    Testing/Procedures:  Your physician has requested that you have a lower extremity arterial duplex. This test is an ultrasound of the arteries in the legs or arms. It looks at arterial blood flow in the legs and arms. Allow one hour for Lower and Upper Arterial scans. There are no restrictions or special instructions    Follow-Up:  3 MONTHS WITH AN EXTENDER ON DR [name]'S TEAM       If you need a refill on your cardiac medications before your next appointment, please call your pharmacy.

## 2018-05-07 ENCOUNTER — Other Ambulatory Visit: Payer: Self-pay | Admitting: Cardiology

## 2018-05-07 DIAGNOSIS — I739 Peripheral vascular disease, unspecified: Secondary | ICD-10-CM

## 2018-05-15 ENCOUNTER — Ambulatory Visit (HOSPITAL_COMMUNITY)
Admission: RE | Admit: 2018-05-15 | Discharge: 2018-05-15 | Disposition: A | Discharge status: Home / Self Care | Payer: 59 | Source: Ambulatory Visit | Attending: Cardiology | Admitting: Cardiology

## 2018-05-15 DIAGNOSIS — I2583 Coronary atherosclerosis due to lipid rich plaque: Secondary | ICD-10-CM | POA: Diagnosis not present

## 2018-05-15 DIAGNOSIS — I739 Peripheral vascular disease, unspecified: Secondary | ICD-10-CM | POA: Diagnosis not present

## 2018-05-15 DIAGNOSIS — I2510 Atherosclerotic heart disease of native coronary artery without angina pectoris: Secondary | ICD-10-CM | POA: Insufficient documentation

## 2018-05-18 ENCOUNTER — Telehealth: Payer: Self-pay

## 2018-05-18 NOTE — Telephone Encounter (Signed)
-----   Message from [name], MD sent at 05/15/2018  3:56 PM EDT ----- No significant stenosis in both of his legs

## 2018-05-18 NOTE — Telephone Encounter (Signed)
Notes recorded by Sigurd Sos, RN on 05/18/2018 at 8:46 AM EDT The patient has been notified of the result and verbalized understanding. All questions (if any) were answered. Sigurd Sos, RN 05/18/2018 8:46 AM

## 2018-06-18 ENCOUNTER — Telehealth: Payer: Self-pay | Admitting: Cardiology

## 2018-06-18 MED ORDER — CLOPIDOGREL BISULFATE 75 MG PO TABS: 75.0000 mg | ORAL_TABLET | Freq: Every day | ORAL | 3 refills | Status: DC

## 2018-06-18 NOTE — Telephone Encounter (Signed)
Pt's medication was sent to pt's pharmacy as requested. Confirmation received.  °

## 2018-06-18 NOTE — Telephone Encounter (Signed)
New Message    *STAT* If patient is at the pharmacy, call can be transferred to refill team.   1. Which medications need to be refilled? (please list name of each medication and dose if known) clopidogrel (PLAVIX) 75 MG tablet 2. Which pharmacy/location (including street and city if local pharmacy) is medication to be sent to? [facility] [address], [place] - [address]  3. Do they need a 30 day or 90 day supply? 90

## 2018-07-21 ENCOUNTER — Telehealth: Payer: Self-pay | Admitting: Cardiology

## 2018-07-21 NOTE — Telephone Encounter (Signed)
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 Please call

## 2018-07-21 NOTE — Telephone Encounter (Signed)
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  Advised the pts wife to have the physical form faxed to our office at [phone] ATTN:Dr. Delton SeeNelson and Lajoyce CornersIvy.  Wife verbalized understanding and agrees with this plan.

## 2018-09-11 ENCOUNTER — Telehealth: Payer: Self-pay | Admitting: Cardiology

## 2018-09-11 MED ORDER — APIXABAN 5 MG PO TABS: 5.0000 mg | ORAL_TABLET | Freq: Two times a day (BID) | ORAL | 0 refills | Status: DC

## 2018-09-11 NOTE — Telephone Encounter (Signed)
Patient hasn't had blood work in >1 year. Will give 1 month fill w/ out refills. Tried to call pt to set him up for CBC/BMP. Left voicemail

## 2018-09-11 NOTE — Telephone Encounter (Signed)
New message       *STAT* If patient is at the pharmacy, call can be transferred to refill team.   1. Which medications need to be refilled? (please list name of each medication and dose if known)  ELIQUIS 5 MG TABS tablet  2. Which pharmacy/location (including street and city if local pharmacy) is medication to be sent to? [facility] [address] [place] - [address]  3. Do they need a 30 day or 90 day supply? 30 day

## 2018-10-06 ENCOUNTER — Telehealth: Payer: Self-pay | Admitting: Cardiology

## 2018-10-06 NOTE — Telephone Encounter (Signed)
  Patient was in hospital at [facility] in [place] due to his heart and they changed some of his medications which he started on Friday. Wife is calling because she would like to speak to [facility] in regards to this change because they want to make sure it is okay with Dr [name] to do this.

## 2018-10-06 NOTE — Telephone Encounter (Signed)
Pts wife is calling to inform Dr [name] that the pt was admitted to [facility] last Thursday, for chest pain.  Wife states that he did wear a monitor while he was admitted. Pt is now discharged and at home. Wife states that they advised the pt to call our office to schedule an appt within a week or 2, for recent discharge from their hospital. [facility] is not in care everywhere, and wife is aware of that and has requested for [facility] to fax over all hospital notes on the pt, while he was admitted.  Advised the pts wife to have them fax his records to our office at [phone] attention to Dr [name]. Wife states that the Cardiologist at [facility] took him off of diltiazem and increased his metoprolol from 50 mg po bid to 100 mg po bid.  Wife states that when she brings the pt into our office to be seen, they will address his med changes in more depth at that appt. Scheduled the pt an appt for next Monday 10/12/18 at 2 pm with Nada Boozer NP.  Wife is aware to have the pt arrive 15 mins prior to that appt. Advised the pts wife to make sure she brings all his paperwork and meds with him to that appt.  Wife states that the pt is doing better, and is stable resting at home. Informed the pts wife that I will route this message to Dr [name] as an Lorain Childes, to make her aware of this plan.  Advised the pts wife to have the pt continue his med regimen as advised from discharge at [facility], unless we make further changes at his upcoming appt next Monday.  Wife verbalized understanding and agrees with this plan.

## 2018-10-06 NOTE — Telephone Encounter (Signed)
Thank you :)

## 2018-10-12 ENCOUNTER — Other Ambulatory Visit: Payer: Self-pay | Admitting: *Deleted

## 2018-10-12 ENCOUNTER — Encounter: Payer: Self-pay | Admitting: *Deleted

## 2018-10-12 ENCOUNTER — Encounter: Payer: Self-pay | Admitting: Cardiology

## 2018-10-12 ENCOUNTER — Ambulatory Visit: Payer: 59 | Admitting: Cardiology

## 2018-10-12 VITALS — BP 110/78 | HR 85 | Ht 71.0 in | Wt 276.8 lb

## 2018-10-12 DIAGNOSIS — I480 Paroxysmal atrial fibrillation: Secondary | ICD-10-CM

## 2018-10-12 DIAGNOSIS — I2583 Coronary atherosclerosis due to lipid rich plaque: Secondary | ICD-10-CM

## 2018-10-12 DIAGNOSIS — I255 Ischemic cardiomyopathy: Secondary | ICD-10-CM

## 2018-10-12 DIAGNOSIS — Z720 Tobacco use: Secondary | ICD-10-CM

## 2018-10-12 DIAGNOSIS — I4819 Other persistent atrial fibrillation: Secondary | ICD-10-CM

## 2018-10-12 DIAGNOSIS — R0600 Dyspnea, unspecified: Secondary | ICD-10-CM

## 2018-10-12 DIAGNOSIS — E785 Hyperlipidemia, unspecified: Secondary | ICD-10-CM

## 2018-10-12 DIAGNOSIS — Z01812 Encounter for preprocedural laboratory examination: Secondary | ICD-10-CM | POA: Diagnosis not present

## 2018-10-12 DIAGNOSIS — I2510 Atherosclerotic heart disease of native coronary artery without angina pectoris: Secondary | ICD-10-CM

## 2018-10-12 DIAGNOSIS — R0609 Other forms of dyspnea: Secondary | ICD-10-CM | POA: Diagnosis not present

## 2018-10-12 DIAGNOSIS — R079 Chest pain, unspecified: Secondary | ICD-10-CM

## 2018-10-12 DIAGNOSIS — E782 Mixed hyperlipidemia: Secondary | ICD-10-CM

## 2018-10-12 MED ORDER — FUROSEMIDE 20 MG PO TABS: 20.0000 mg | ORAL_TABLET | Freq: Every day | ORAL | 3 refills | Status: DC

## 2018-10-12 NOTE — Progress Notes (Signed)
 Cardiology Office Note   Date:  10/12/2018   ID:  Ian [name], DOB 03/10/1971, MRN [id number]  PCP:  Finnegan, [name], MD  Cardiologist:  Dr. [name]    Chief Complaint  Patient presents with  . Shortness of Breath  . Chest Pain      History of Present Illness: Ian [name] is a 47 y.o. male who presents for post hospital at [facility] in [place],  For chest pain.  He was taken off dilt.  And metoprolol now 100 BID from 50 mg.    history of CAD status post NSTEMI 2016 treated with DES to the RCA with residual 25% mid circumflex LVEF 45 to 50%.  He also had A. fib with RVR at that time with CHA2DS2-VASc equal to 2 and was on aspirin Plavix and Eliquis for 30 days and then aspirin was stopped.  Patient had abnormal stress test in 08/2016 for possible inferior ischemia and underwent cardiac catheterization which showed 80% in-stent restenosis of the RCA and underwent balloon angioplasty.  There was also 90% mid RCA unable to cross the lesion with the balloon so underwent staged orbital atherectomy and DES placement 09/05/2016.  EF 40 to 45% at that time.  Low risk Myoview 04/2017.  Patient also has hypertension, HLD, type 2 diabetes mellitus, tobacco abuse, obesity.  Patient called in stating he was having intermittent chest pain 03/12/2018 and was admitted overnight at [facility].  Per patient report he had a stress test and labs that were normal and ruled out for an MI.  Chart reviewed in care everywhere.  Troponin negative BNP 40 CTA negative for pulmonary embolism, EKG atrial fibrillation 120 beats per minute.  Received Lasix in the ED.  Lexiscan no ischemia prior MI versus attenuation.  It was not gated so they could not distinguish.  Patient comes in for post hospital f/u accompanied by his wife. Still having continuous chest pain-always there since 2016. He went to ER because he became dizzy and felt like he may pass out. 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  He is working in the heat of the day.  He has to stop and rest and sit down.  He eats terrible.  His last hemoglobin A1c was over 8 and he continues to smoke a pack of cigarettes a day.  05/04/2018 -patient is coming after 1 months, he underwent Holter monitoring that showed atrial fibrillation 80% of the monitoring time a lot of times with RVR, his metoprolol was increased to 50 mg p.o. twice daily and since then his chest pain has resolved.  He has noticed mild lower extremity edema left more than right, that has now resolved and he attributes it to the arthritis in his left foot.  Today pt is seen after admit at [facility].  He had echo with continued EF 40 % mildly decreased from previous.  He has had increased chest pain off and on since July, and increase of dyspnea.  Now persistent a fib.  His dilt was stopped and BB increased.  Still with DOE.  Not at rest with exertion.   No nausea or vomiting or diaphoresis.  He prob has OSA but last sleep study was neg.     Past Medical History:  Diagnosis Date  . Arthritis    "left ankle" (09/04/2016)  . Cardiomyopathy (HCC)    a. EF   40-45%  . Chest pain 04/22/2017  . Complication of anesthesia    "hard to get me out of it sometimes; usually takes me longer to get under"  . Coronary artery disease    a. multiple PCIs to RCA.  . Essential hypertension 12/19/2015  . Hyperlipidemia 05/21/2015  . Hypertension   . NSTEMI (non-ST elevated myocardial infarction) (HCC) 05/2015   /notes 05/23/2015 "minor one"  . PAF (paroxysmal atrial fibrillation) (HCC)   . Tobacco abuse 05/21/2015  . Type II diabetes mellitus (HCC)     Past Surgical History:  Procedure Laterality Date  . CARDIAC CATHETERIZATION  2000, 2005   "less than 20% blockage"  . CARDIAC CATHETERIZATION N/A 05/22/2015   Procedure: Left Heart Cath and Coronary Angiography;  Surgeon: [name], MD;  Location: [facility];  Service: Cardiovascular;  Laterality: N/A;  . CARDIAC CATHETERIZATION N/A 05/22/2015   Procedure: Coronary Stent Intervention;  Surgeon: [name], MD;  Location: [facility];  Service: Cardiovascular;  Laterality: N/A;  . CARDIAC CATHETERIZATION N/A 09/04/2016   Procedure: Left Heart Cath and Coronary Angiography;  Surgeon: [name], MD;  Location: [facility];  Service: Cardiovascular;  Laterality: N/A;  . CARDIAC CATHETERIZATION N/A 09/04/2016   Procedure: Coronary Balloon Angioplasty;  Surgeon: [name], MD;  Location: [facility];  Service: Cardiovascular;  Laterality: N/A;  . CARDIAC CATHETERIZATION N/A 09/05/2016   Procedure: Coronary Atherectomy;  Surgeon: David W Harding, MD;  Location: [facility];  Service: Cardiovascular;  Laterality: N/A;  . CARDIAC CATHETERIZATION N/A 09/05/2016   Procedure: Coronary Stent Intervention;  Surgeon: David W Harding, MD;  Location: [facility];  Service: Cardiovascular;  Laterality: N/A;  . CARDIAC CATHETERIZATION N/A 09/05/2016   Procedure: Temporary Pacemaker;  Surgeon: David W Harding, MD;  Location: [facility];  Service: Cardiovascular;  Laterality: N/A;  . CORONARY ANGIOPLASTY    . MULTIPLE TOOTH EXTRACTIONS  2013   "all of them"  . TYMPANOSTOMY TUBE PLACEMENT Bilateral 1970s     Current Outpatient Medications  Medication Sig Dispense Refill  . apixaban (ELIQUIS) 5 MG TABS tablet Take 1 tablet (5 mg total) by mouth 2 (two) times daily. 60 tablet 0  . atorvastatin (LIPITOR) 80 MG tablet Take 1 tablet (80 mg total) by mouth daily at 6 PM. 90 tablet 2  . clopidogrel (PLAVIX) 75 MG tablet Take 1 tablet (75 mg total) by mouth daily. 90 tablet 3  . dapagliflozin propanediol (FARXIGA) 5 MG TABS tablet Take 5 mg by mouth daily.    . fenofibrate (TRICOR) 48 MG tablet Take 1 tablet (48 mg total) by mouth daily. 30 tablet 11  . glipiZIDE (GLUCOTROL) 5 MG tablet Take 5-10 mg  by mouth See admin instructions. 10mg in the am and 5mg in the evening    . lisinopril (PRINIVIL,ZESTRIL) 2.5 MG tablet Take 1 tablet (2.5 mg total) by mouth daily. 90 tablet 2  . metFORMIN (GLUCOPHAGE) 500 MG tablet Take 1 tablet (500 mg total) by mouth 2 (two) times daily with a meal.    . metoprolol tartrate (LOPRESSOR) 100 MG tablet Take 100 mg by mouth 2 (two) times daily.    . Multiple Vitamin (MULTIVITAMIN) tablet Take 1 tablet by mouth daily.    . nitroGLYCERIN (NITROSTAT) 0.4 MG SL tablet Place 1 tablet (0.4 mg total) under the tongue every 5 (five) minutes x 3 doses as needed for chest pain. 25 tablet   12  . Omega-3 Fatty Acids (FISH OIL) 1200 MG CAPS Take 1,200 mg by mouth daily.     . furosemide (LASIX) 20 MG tablet Take 1 tablet (20 mg total) by mouth daily. 90 tablet 3   No current facility-administered medications for this visit.     Allergies:   Patient has no known allergies.    Social History:  The patient  reports that he has been smoking cigarettes and e-cigarettes. He has a 70.00 pack-year smoking history. He has never used smokeless tobacco. He reports current alcohol use. He reports current drug use. Drugs: Cocaine and Marijuana.   Family History:  The patient's family history includes Diabetes in his maternal grandmother, mother, and sister; Heart attack in his father.    ROS:  General:no colds or fevers, no weight changes Skin:no rashes or ulcers HEENT:no blurred vision, no congestion CV:see HPI PUL:see HPI GI:no diarrhea constipation or melena, no indigestion GU:no hematuria, no dysuria MS:no joint pain, no claudication Neuro:no syncope, no lightheadedness Endo:+ diabetes, no thyroid disease  Wt Readings from Last 3 Encounters:  10/12/18 276 lb 12.8 oz (125.6 kg)  05/04/18 274 lb (124.3 kg)  03/31/18 270 lb (122.5 kg)     PHYSICAL EXAM: VS:  BP 110/78   Pulse 85   Ht 5' 11" (1.803 m)   Wt 276 lb 12.8 oz (125.6 kg)   SpO2 93%   BMI 38.61 kg/m  , BMI  Body mass index is 38.61 kg/m. General:Pleasant affect, NAD Skin:Warm and dry, brisk capillary refill HEENT:normocephalic, sclera clear, mucus membranes moist Neck:supple, no JVD, no bruits  Heart:irreg irreg  without murmur, gallup, rub or click Lungs:clear without rales, rhonchi, or wheezes Abd:soft, non tender, + BS, do not palpate liver spleen or masses Ext:no lower ext edema, 2+ pedal pulses, 2+ radial pulses Neuro:alert and oriented X 3, MAE, follows commands, + facial symmetry    EKG:  EKG is ordered today. The ekg ordered today demonstrates a fib Rt ward axis.  No acute changes except persistent a fib.    Recent Labs: No results found for requested labs within last 8760 hours.    Lipid Panel    Component Value Date/Time   CHOL 144 05/05/2017 0744   TRIG 291 (H) 05/05/2017 0744   HDL 29 (L) 05/05/2017 0744   CHOLHDL 5.0 05/05/2017 0744   CHOLHDL 7.9 05/22/2015 0233   VLDL UNABLE TO CALCULATE IF TRIGLYCERIDE OVER 400 mg/dL 05/22/2015 0233   LDLCALC 57 05/05/2017 0744       Other studies Reviewed: Additional studies/ records that were reviewed today include:   Echo 10/02/18 Summary:  1. The study quality is technically difficult, however, Definity used to enhance left ventricular visualization and quality. 2. The left ventricular chamber size is normal. 3. There is mildly decreased left ventricular systolic function. Mild global hypokinesis of the left ventricle is observed. The ejection fraction is estimated to be 40%. 4. There is mild mitral regurgitation observed.   Findings     Technical Comments: The study quality is technically difficult, however, Definity used to enhance left ventricular visualization and quality. The study is technically limited due to patient body habitus.    Left Ventricle: The left ventricular chamber size is normal. There is mildly decreased left ventricular systolic function. Mild global hypokinesis of the  left ventricle is observed. The ejection fraction is estimated to be 40%.  Left ventricular diastolic function is unable to be assessed due to arrhythmia.   Left Atrium: The left   atrial chamber size is normal.   Right Ventricle: The right ventricular cavity size is normal. The right ventricular global systolic function is normal.   Right Atrium: The right atrium is normal.   Aortic Valve: The aortic valve is not well visualized. There is no evidence of aortic regurgitation.   Mitral Valve: The mitral valve leaflets appear normal. There is mild mitral regurgitation observed.   Tricuspid Valve: The tricuspid valve leaflets are morphologically normal. There is trivial tricuspid regurgitation.   Pulmonic Valve: The pulmonic valve is not well visualized. There is no evidence of pulmonic regurgitation.   Pericardium: The pericardium is not well visualized.   Aorta: There is no dilatation of the ascending aorta.   Pulmonary Artery: The main pulmonary artery is not well visualized.   Venous: The inferior vena cava is dilated with <50% inspiratory collapse, suggestive of an elevated right atrial pressure (15mmHg).   Contrast: Definity was used to optimize study.   Measurements  Chambers Name             Value     Normal Range    IVSd (2D)          0.92 cm    (0.6 - 1.1)    LVIDd (2D)          4.94 cm    -          LVIDs (2D)          4.16 cm    (2.2 - 4)     LVPWd (2D)          0.94 cm    (0.6 - 1.1)    Ao root diameter (2D)    3.1 cm    -          Ascending Ao         3.4 cm    -          LA dimension 2D       3.7 cm    -          LA ESV SP 4CH (MOD)     43.1 ml    -          LA ESV SP 2CH (MOD)     31 ml     -          LA ESV BP (MOD)       37 ml     -           LA ESV BP (MOD) index    15.4 ml/m2  (16 - 28)     BP EF (MOD)         39.3 %    (55 - 80)     LV FS (Teichholz) (2D)    15.8 %    (20 - 80)      Mitral Valve Name             Value     Normal Range    MV E-wave Vmax        1.02 m/sec  -          LV E:e' lateral ratio    6.9 ratio   -          LV E:e' septal ratio     9.5 ratio   -          MV PHT            67 msec    -            MVA (PHT)          3.28 cm2   (4 - 6)      Nuc 03/2018   ASSESSMENT AND PLAN:  1. Continued DOE and chest pressure.  Known CAD with neg nuc in Sept 2019. But symptoms persist.  Discussed with Dr. [name] and she recommends cardiac cath to rule out ischemia.    The patient understands that risks included but are not limited to stroke (1 in 1000), death (1 in 1000), kidney failure [usually temporary] (1 in 500), bleeding (1 in 200), allergic reaction [possibly serious] (1 in 200).   Will hold eliquis for 3 doses prior to procedure and they prefer cath on Friday.  I did not change meds for now.    2.  Persistent a fib.  Rate controlled though I suspect with activity HR is increased and this may be cause of dyspnea.  Once ischemia eval complete then possible DCCV - would also consider EP eval for other options.  Will have him follow up next week to decide on DCCV vs. EP or both.   3.  LV dysfunction EF has decreased some, now 40% - eval for ischemia and if cors stable could be due to a fib with RVR.   His dilt was stopped with decrease in EF.   4.   Tobacco use continues he will continue to try and stop  5.  HLD most recent LDL 62, HDL 24 continue statin. Lipitor 80 mg and fenofibrate.  6.  Diabetes type 2 will hold metformin for cath.    Current medicines are reviewed with the patient today.  The patient Has no concerns regarding medicines.  The following changes have  been made:  See above Labs/ tests ordered today include:see above  Disposition:   FU:  see above  Signed, Jadakiss Barish, NP  10/12/2018 5:25 PM    [facility] [address], [place], [place]  [address]/ [address] [place], [place] Phone: [phone]; Fax: [phone]  [phone] 

## 2018-10-12 NOTE — H&P (View-Only) (Signed)
Cardiology Office Note   Date:  10/12/2018   ID:  [name], DOB Jul 02, 1971, MRN [id number]  PCP:  [name], MD  Cardiologist:  Dr. [name]    Chief Complaint  Patient presents with  . Shortness of Breath  . Chest Pain      History of Present Illness: Ian [name] is a 48 y.o. male who presents for post hospital at [facility] in [place],  For chest pain.  He was taken off dilt.  And metoprolol now 100 BID from 50 mg.    history of CAD status post NSTEMI 2016 treated with DES to the RCA with residual 25% mid circumflex LVEF 45 to 50%.  He also had A. fib with RVR at that time with CHA2DS2-VASc equal to 2 and was on aspirin Plavix and Eliquis for 30 days and then aspirin was stopped.  Patient had abnormal stress test in 08/2016 for possible inferior ischemia and underwent cardiac catheterization which showed 80% in-stent restenosis of the RCA and underwent balloon angioplasty.  There was also 90% mid RCA unable to cross the lesion with the balloon so underwent staged orbital atherectomy and DES placement 09/05/2016.  EF 40 to 45% at that time.  Low risk Myoview 04/2017.  Patient also has hypertension, HLD, type 2 diabetes mellitus, tobacco abuse, obesity.  Patient called in stating he was having intermittent chest pain 03/12/2018 and was admitted overnight at [facility].  Per patient report he had a stress test and labs that were normal and ruled out for an MI.  Chart reviewed in care everywhere.  Troponin negative BNP 40 CTA negative for pulmonary embolism, EKG atrial fibrillation 120 beats per minute.  Received Lasix in the ED.  Lexiscan no ischemia prior MI versus attenuation.  It was not gated so they could not distinguish.  Patient comes in for post hospital f/u accompanied by his wife. Still having continuous chest pain-always there since 2016. He went to ER because he became dizzy and felt like he may pass out. 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  He is working in the heat of the day.  He has to stop and rest and sit down.  He eats terrible.  His last hemoglobin A1c was over 8 and he continues to smoke a pack of cigarettes a day.  05/04/2018 -patient is coming after 1 months, he underwent Holter monitoring that showed atrial fibrillation 80% of the monitoring time a lot of times with RVR, his metoprolol was increased to 50 mg p.o. twice daily and since then his chest pain has resolved.  He has noticed mild lower extremity edema left more than right, that has now resolved and he attributes it to the arthritis in his left foot.  Today pt is seen after admit at [facility].  He had echo with continued EF 40 % mildly decreased from previous.  He has had increased chest pain off and on since July, and increase of dyspnea.  Now persistent a fib.  His dilt was stopped and BB increased.  Still with DOE.  Not at rest with exertion.   No nausea or vomiting or diaphoresis.  He prob has OSA but last sleep study was neg.     Past Medical History:  Diagnosis Date  . Arthritis    "left ankle" (09/04/2016)  . Cardiomyopathy (HCC)    a. EF  40-45%  . Chest pain 04/22/2017  . Complication of anesthesia    "hard to get me out of it sometimes; usually takes me longer to get under"  . Coronary artery disease    a. multiple PCIs to RCA.  . Essential hypertension 12/19/2015  . Hyperlipidemia 05/21/2015  . Hypertension   . NSTEMI (non-ST elevated myocardial infarction) (HCC) 05/2015   Ian [name] 05/23/2015 "minor one"  . PAF (paroxysmal atrial fibrillation) (HCC)   . Tobacco abuse 05/21/2015  . Type II diabetes mellitus (HCC)     Past Surgical History:  Procedure Laterality Date  . CARDIAC CATHETERIZATION  2000, 2005   "less than 20% blockage"  . CARDIAC CATHETERIZATION N/A 05/22/2015   Procedure: Left Heart Cath and Coronary Angiography;  Surgeon: [name], MD;  Location: [facility] INVASIVE  CV LAB;  Service: Cardiovascular;  Laterality: N/A;  . CARDIAC CATHETERIZATION N/A 05/22/2015   Procedure: Coronary Stent Intervention;  Surgeon: [name], MD;  Location: [facility] INVASIVE CV LAB;  Service: Cardiovascular;  Laterality: N/A;  . CARDIAC CATHETERIZATION N/A 09/04/2016   Procedure: Left Heart Cath and Coronary Angiography;  Surgeon: [name], MD;  Location: [facility] INVASIVE CV LAB;  Service: Cardiovascular;  Laterality: N/A;  . CARDIAC CATHETERIZATION N/A 09/04/2016   Procedure: Coronary Balloon Angioplasty;  Surgeon: [name], MD;  Location: [facility] INVASIVE CV LAB;  Service: Cardiovascular;  Laterality: N/A;  . CARDIAC CATHETERIZATION N/A 09/05/2016   Procedure: Coronary Atherectomy;  Surgeon: Marykay Lex, MD;  Location: [facility] INVASIVE CV LAB;  Service: Cardiovascular;  Laterality: N/A;  . CARDIAC CATHETERIZATION N/A 09/05/2016   Procedure: Coronary Stent Intervention;  Surgeon: Marykay Lex, MD;  Location: [facility] INVASIVE CV LAB;  Service: Cardiovascular;  Laterality: N/A;  . CARDIAC CATHETERIZATION N/A 09/05/2016   Procedure: Temporary Pacemaker;  Surgeon: Marykay Lex, MD;  Location: [facility] INVASIVE CV LAB;  Service: Cardiovascular;  Laterality: N/A;  . CORONARY ANGIOPLASTY    . MULTIPLE TOOTH EXTRACTIONS  2013   "all of them"  . TYMPANOSTOMY TUBE PLACEMENT Bilateral 1970s     Current Outpatient Medications  Medication Sig Dispense Refill  . apixaban (ELIQUIS) 5 MG TABS tablet Take 1 tablet (5 mg total) by mouth 2 (two) times daily. 60 tablet 0  . atorvastatin (LIPITOR) 80 MG tablet Take 1 tablet (80 mg total) by mouth daily at 6 PM. 90 tablet 2  . clopidogrel (PLAVIX) 75 MG tablet Take 1 tablet (75 mg total) by mouth daily. 90 tablet 3  . dapagliflozin propanediol (FARXIGA) 5 MG TABS tablet Take 5 mg by mouth daily.    . fenofibrate (TRICOR) 48 MG tablet Take 1 tablet (48 mg total) by mouth daily. 30 tablet 11  . glipiZIDE (GLUCOTROL) 5 MG tablet Take 5-10 mg  by mouth See admin instructions.  in the am and  in the evening    . lisinopril (PRINIVIL,ZESTRIL) 2.5 MG tablet Take 1 tablet (2.5 mg total) by mouth daily. 90 tablet 2  . metFORMIN (GLUCOPHAGE) 500 MG tablet Take 1 tablet (500 mg total) by mouth 2 (two) times daily with a meal.    . metoprolol tartrate (LOPRESSOR) 100 MG tablet Take 100 mg by mouth 2 (two) times daily.    . Multiple Vitamin (MULTIVITAMIN) tablet Take 1 tablet by mouth daily.    . nitroGLYCERIN (NITROSTAT) 0.4 MG SL tablet Place 1 tablet (0.4 mg total) under the tongue every 5 (five) minutes x 3 doses as needed for chest pain. 25 tablet  12  . Omega-3 Fatty Acids (FISH OIL) 1200 MG CAPS Take 1,200 mg by mouth daily.     . furosemide (LASIX) 20 MG tablet Take 1 tablet (20 mg total) by mouth daily. 90 tablet 3   No current facility-administered medications for this visit.     Allergies:   Patient has no known allergies.    Social History:  The patient  reports that he has been smoking cigarettes and e-cigarettes. He has a 70.00 pack-year smoking history. He has never used smokeless tobacco. He reports current alcohol use. He reports current drug use. Drugs: Cocaine and Marijuana.   Family History:  The patient's family history includes Diabetes in his maternal grandmother, mother, and sister; Heart attack in his father.    ROS:  General:no colds or fevers, no weight changes Skin:no rashes or ulcers HEENT:no blurred vision, no congestion CV:see HPI PUL:see HPI GI:no diarrhea constipation or melena, no indigestion GU:no hematuria, no dysuria MS:no joint pain, no claudication Neuro:no syncope, no lightheadedness Endo:+ diabetes, no thyroid disease  Wt Readings from Last 3 Encounters:  10/12/18 276 lb 12.8 oz (125.6 kg)  05/04/18 274 lb (124.3 kg)  03/31/18 270 lb (122.5 kg)     PHYSICAL EXAM: VS:  BP 110/78   Pulse 85   Ht 5\' 11"  (1.803 m)   Wt 276 lb 12.8 oz (125.6 kg)   SpO2 93%   BMI 38.61 kg/m  , BMI  Body mass index is 38.61 kg/m. General:Pleasant affect, NAD Skin:Warm and dry, brisk capillary refill HEENT:normocephalic, sclera clear, mucus membranes moist Neck:supple, no JVD, no bruits  Heart:irreg irreg  without murmur, gallup, rub or click Lungs:clear without rales, rhonchi, or wheezes AXE:NMMH, non tender, + BS, do not palpate liver spleen or masses Ext:no lower ext edema, 2+ pedal pulses, 2+ radial pulses Neuro:alert and oriented X 3, MAE, follows commands, + facial symmetry    EKG:  EKG is ordered today. The ekg ordered today demonstrates a fib Rt ward axis.  No acute changes except persistent a fib.    Recent Labs: No results found for requested labs within last 8760 hours.    Lipid Panel    Component Value Date/Time   CHOL 144 05/05/2017 0744   TRIG 291 (H) 05/05/2017 0744   HDL 29 (L) 05/05/2017 0744   CHOLHDL 5.0 05/05/2017 0744   CHOLHDL 7.9 05/22/2015 0233   VLDL UNABLE TO CALCULATE IF TRIGLYCERIDE OVER 400 mg/dL 68/03/8109 3159   LDLCALC 57 05/05/2017 0744       Other studies Reviewed: Additional studies/ records that were reviewed today include:   Echo 10/02/18 Summary:  1. The study quality is technically difficult, however, Definity used to enhance left ventricular visualization and quality. 2. The left ventricular chamber size is normal. 3. There is mildly decreased left ventricular systolic function. Mild global hypokinesis of the left ventricle is observed. The ejection fraction is estimated to be 40%. 4. There is mild mitral regurgitation observed.   Findings     Technical Comments: The study quality is technically difficult, however, Definity used to enhance left ventricular visualization and quality. The study is technically limited due to patient body habitus.    Left Ventricle: The left ventricular chamber size is normal. There is mildly decreased left ventricular systolic function. Mild global hypokinesis of the  left ventricle is observed. The ejection fraction is estimated to be 40%.  Left ventricular diastolic function is unable to be assessed due to arrhythmia.   Left Atrium: The left  atrial chamber size is normal.   Right Ventricle: The right ventricular cavity size is normal. The right ventricular global systolic function is normal.   Right Atrium: The right atrium is normal.   Aortic Valve: The aortic valve is not well visualized. There is no evidence of aortic regurgitation.   Mitral Valve: The mitral valve leaflets appear normal. There is mild mitral regurgitation observed.   Tricuspid Valve: The tricuspid valve leaflets are morphologically normal. There is trivial tricuspid regurgitation.   Pulmonic Valve: The pulmonic valve is not well visualized. There is no evidence of pulmonic regurgitation.   Pericardium: The pericardium is not well visualized.   Aorta: There is no dilatation of the ascending aorta.   Pulmonary Artery: The main pulmonary artery is not well visualized.   Venous: The inferior vena cava is dilated with <50% inspiratory collapse, suggestive of an elevated right atrial pressure ( ).   Contrast: Definity was used to optimize study.   Measurements  Chambers Name             Value     Normal Range    IVSd (2D)          0.92 cm    (0.6 - 1.1)    LVIDd (2D)          4.94 cm    -          LVIDs (2D)          4.16 cm    (2.2 - 4)     LVPWd (2D)          0.94 cm    (0.6 - 1.1)    Ao root diameter (2D)    3.1 cm    -          Ascending Ao         3.4 cm    -          LA dimension 2D       3.7 cm    -          LA ESV SP 4CH (MOD)     43.1 ml    -          LA ESV SP 2CH (MOD)     31 ml     -          LA ESV BP (MOD)       37 ml     -           LA ESV BP (MOD) index    15.4 ml/m2  (16 - 28)     BP EF (MOD)         39.3 %    (55 - 80)     LV FS (Teichholz) (2D)    15.8 %    (20 - 80)      Mitral Valve Name             Value     Normal Range    MV E-wave Vmax        1.02 m/sec  -          LV E:e' lateral ratio    6.9 ratio   -          LV E:e' septal ratio     9.5 ratio   -          MV PHT            67 msec    -  MVA (PHT)          3.28 cm2   (4 - 6)      Nuc 03/2018   ASSESSMENT AND PLAN:  1. Continued DOE and chest pressure.  Known CAD with neg nuc in Sept 2019. But symptoms persist.  Discussed with Dr. [name] and she recommends cardiac cath to rule out ischemia.    The patient understands that risks included but are not limited to stroke (1 in 1000), death (1 in 1000), kidney failure [usually temporary] (1 in 500), bleeding (1 in 200), allergic reaction [possibly serious] (1 in 200).   Will hold eliquis for 3 doses prior to procedure and they prefer cath on Friday.  I did not change meds for now.    2.  Persistent a fib.  Rate controlled though I suspect with activity HR is increased and this may be cause of dyspnea.  Once ischemia eval complete then possible DCCV - would also consider EP eval for other options.  Will have him follow up next week to decide on DCCV vs. EP or both.   3.  LV dysfunction EF has decreased some, now 40% - eval for ischemia and if cors stable could be due to a fib with RVR.   His dilt was stopped with decrease in EF.   4.   Tobacco use continues he will continue to try and stop  5.  HLD most recent LDL 62, HDL 24 continue statin. Lipitor 80 mg and fenofibrate.  6.  Diabetes type 2 will hold metformin for cath.    Current medicines are reviewed with the patient today.  The patient Has no concerns regarding medicines.  The following changes have  been made:  See above Labs/ tests ordered today include:see above  Disposition:   FU:  see above  Signed, Nada Boozer, NP  10/12/2018 5:25 PM    [facility] [address] [place], [place], [place]  [address]/ [address] [place], [place] Phone: [phone]; Fax: [phone]  [phone]

## 2018-10-12 NOTE — Patient Instructions (Addendum)
Your physician recommends that you continue on your current medications as directed. Please refer to the Current Medication list given to you today.    Your physician recommends that you return for lab work in:  TODAY  BMET AND CBC  Your physician recommends that you schedule a follow-up appointment in:  PT HAVING CATH MARCH 6 NEEDS F/U  MAR 14 WITH DR  [name] OR EXTENDER  Your physician has requested that you have a cardiac catheterization. Cardiac catheterization is used to diagnose and/or treat various heart conditions. Doctors may recommend this procedure for a number of different reasons. The most common reason is to evaluate chest pain. Chest pain can be a symptom of coronary artery disease (CAD), and cardiac catheterization can show whether plaque is narrowing or blocking your heart's arteries. This procedure is also used to evaluate the valves, as well as measure the blood flow and oxygen levels in different parts of your heart. For further information please visit [email or web address] Please follow instruction sheet, as given.

## 2018-10-13 ENCOUNTER — Telehealth: Payer: Self-pay | Admitting: *Deleted

## 2018-10-13 LAB — CBC WITH DIFFERENTIAL/PLATELET
BASOS ABS: 0.1 10*3/uL (ref 0.0–0.2)
BASOS: 1 %
EOS (ABSOLUTE): 0.2 10*3/uL (ref 0.0–0.4)
Eos: 2 %
Hematocrit: 49.5 % (ref 37.5–51.0)
Hemoglobin: 17.6 g/dL (ref 13.0–17.7)
IMMATURE GRANULOCYTES: 1 %
Immature Grans (Abs): 0.1 10*3/uL (ref 0.0–0.1)
Lymphocytes Absolute: 3.2 10*3/uL — ABNORMAL HIGH (ref 0.7–3.1)
Lymphs: 32 %
MCH: 33.5 pg — ABNORMAL HIGH (ref 26.6–33.0)
MCHC: 35.6 g/dL (ref 31.5–35.7)
MCV: 94 fL (ref 79–97)
MONOS ABS: 0.8 10*3/uL (ref 0.1–0.9)
Monocytes: 8 %
NEUTROS PCT: 56 %
Neutrophils Absolute: 5.7 10*3/uL (ref 1.4–7.0)
PLATELETS: 154 10*3/uL (ref 150–450)
RBC: 5.25 x10E6/uL (ref 4.14–5.80)
RDW: 12.7 % (ref 11.6–15.4)
WBC: 10 10*3/uL (ref 3.4–10.8)

## 2018-10-13 LAB — BASIC METABOLIC PANEL
BUN / CREAT RATIO: 15 (ref 9–20)
BUN: 18 mg/dL (ref 6–24)
CALCIUM: 9.8 mg/dL (ref 8.7–10.2)
CHLORIDE: 102 mmol/L (ref 96–106)
CO2: 22 mmol/L (ref 20–29)
Creatinine, Ser: 1.18 mg/dL (ref 0.76–1.27)
GFR calc non Af Amer: 72 mL/min/{1.73_m2} (ref >59)
GFR, EST AFRICAN AMERICAN: 83 mL/min/{1.73_m2} (ref >59)
GLUCOSE: 82 mg/dL (ref 65–99)
POTASSIUM: 4.3 mmol/L (ref 3.5–5.2)
Sodium: 141 mmol/L (ref 134–144)

## 2018-10-13 NOTE — Telephone Encounter (Addendum)
Pt's wife calling to clarify instructions for holding Eliquis prior to [facility] 10/16/18 7 AM.  Pt's wife advised I will forward to Nada Boozer, NP for review and recommendations.

## 2018-10-14 NOTE — Telephone Encounter (Addendum)
Pt contacted pre-catheterization scheduled at [facility] for: Friday October 16, 2018 9 AM Verified arrival time and place: [facility] Main Entrance A at: 7 AM  No solid food after midnight prior to cath, clear liquids until 5 AM day of procedure. Contrast allergy: no  Hold: Eliquis-last dose AM 10/14/18 until post procedure -pt aware, confirmed with Vernona Rieger. Metformin-day of procedure and 48 hours post procedure.  Glipizide-AM of procedure. Farxiga-AM of procedure. Furosemide-AM of procedure.  Except hold medications AM meds can be  taken pre-cath with sip of water including: ASA 81 mg Clopidogrel 75 mg  Confirmed patient has responsible person to drive home post procedure and observe 24 hours after arriving home: yes  I reviewed all  instructions with patient, including instructions for holding Eliquis-last dose AM 10/14/18 until post procedure, patient verbalized understanding, thanked me for call.

## 2018-10-16 ENCOUNTER — Encounter (HOSPITAL_COMMUNITY)
Admission: RE | Disposition: A | Discharge status: Home / Self Care | Payer: Self-pay | Source: Home / Self Care | Attending: Interventional Cardiology

## 2018-10-16 ENCOUNTER — Ambulatory Visit (HOSPITAL_COMMUNITY)
Admission: RE | Admit: 2018-10-16 | Discharge: 2018-10-16 | Disposition: A | Discharge status: Home / Self Care | Payer: 59 | Attending: Interventional Cardiology | Admitting: Interventional Cardiology

## 2018-10-16 ENCOUNTER — Other Ambulatory Visit: Payer: Self-pay

## 2018-10-16 DIAGNOSIS — F17210 Nicotine dependence, cigarettes, uncomplicated: Secondary | ICD-10-CM | POA: Diagnosis not present

## 2018-10-16 DIAGNOSIS — R0602 Shortness of breath: Secondary | ICD-10-CM | POA: Diagnosis not present

## 2018-10-16 DIAGNOSIS — E669 Obesity, unspecified: Secondary | ICD-10-CM | POA: Diagnosis not present

## 2018-10-16 DIAGNOSIS — Z7984 Long term (current) use of oral hypoglycemic drugs: Secondary | ICD-10-CM | POA: Insufficient documentation

## 2018-10-16 DIAGNOSIS — I4819 Other persistent atrial fibrillation: Secondary | ICD-10-CM | POA: Diagnosis not present

## 2018-10-16 DIAGNOSIS — Z7901 Long term (current) use of anticoagulants: Secondary | ICD-10-CM | POA: Diagnosis not present

## 2018-10-16 DIAGNOSIS — I252 Old myocardial infarction: Secondary | ICD-10-CM | POA: Diagnosis not present

## 2018-10-16 DIAGNOSIS — Z7902 Long term (current) use of antithrombotics/antiplatelets: Secondary | ICD-10-CM | POA: Insufficient documentation

## 2018-10-16 DIAGNOSIS — Z955 Presence of coronary angioplasty implant and graft: Secondary | ICD-10-CM | POA: Diagnosis not present

## 2018-10-16 DIAGNOSIS — Z79899 Other long term (current) drug therapy: Secondary | ICD-10-CM | POA: Diagnosis not present

## 2018-10-16 DIAGNOSIS — I25118 Atherosclerotic heart disease of native coronary artery with other forms of angina pectoris: Secondary | ICD-10-CM

## 2018-10-16 DIAGNOSIS — Z6838 Body mass index (BMI) 38.0-38.9, adult: Secondary | ICD-10-CM | POA: Insufficient documentation

## 2018-10-16 DIAGNOSIS — Z8249 Family history of ischemic heart disease and other diseases of the circulatory system: Secondary | ICD-10-CM | POA: Diagnosis not present

## 2018-10-16 DIAGNOSIS — I429 Cardiomyopathy, unspecified: Secondary | ICD-10-CM | POA: Diagnosis not present

## 2018-10-16 DIAGNOSIS — I2584 Coronary atherosclerosis due to calcified coronary lesion: Secondary | ICD-10-CM | POA: Insufficient documentation

## 2018-10-16 DIAGNOSIS — Z833 Family history of diabetes mellitus: Secondary | ICD-10-CM | POA: Diagnosis not present

## 2018-10-16 DIAGNOSIS — I10 Essential (primary) hypertension: Secondary | ICD-10-CM | POA: Diagnosis not present

## 2018-10-16 DIAGNOSIS — E785 Hyperlipidemia, unspecified: Secondary | ICD-10-CM | POA: Insufficient documentation

## 2018-10-16 DIAGNOSIS — E119 Type 2 diabetes mellitus without complications: Secondary | ICD-10-CM | POA: Insufficient documentation

## 2018-10-16 HISTORY — PX: INTRAVASCULAR PRESSURE WIRE/FFR STUDY: CATH118243

## 2018-10-16 HISTORY — PX: LEFT HEART CATH AND CORONARY ANGIOGRAPHY: CATH118249

## 2018-10-16 LAB — POCT ACTIVATED CLOTTING TIME: Activated Clotting Time: 395 seconds

## 2018-10-16 LAB — GLUCOSE, CAPILLARY: Glucose-Capillary: 107 mg/dL — ABNORMAL HIGH (ref 70–99)

## 2018-10-16 SURGERY — LEFT HEART CATH AND CORONARY ANGIOGRAPHY
Anesthesia: LOCAL

## 2018-10-16 MED ORDER — VERAPAMIL HCL 2.5 MG/ML IV SOLN
INTRAVENOUS | Status: DC | PRN
  Administered 2018-10-16: 10 mL via INTRA_ARTERIAL

## 2018-10-16 MED ORDER — IOHEXOL 350 MG/ML SOLN
INTRAVENOUS | Status: DC | PRN
  Administered 2018-10-16: 80 mL via INTRACARDIAC

## 2018-10-16 MED ORDER — LIDOCAINE HCL (PF) 1 % IJ SOLN
INTRAMUSCULAR | Status: AC
  Filled 2018-10-16: qty 30

## 2018-10-16 MED ORDER — HEPARIN SODIUM (PORCINE) 1000 UNIT/ML IJ SOLN
INTRAMUSCULAR | Status: DC | PRN
  Administered 2018-10-16: 8000 [IU] via INTRAVENOUS
  Administered 2018-10-16: 6000 [IU] via INTRAVENOUS

## 2018-10-16 MED ORDER — LIDOCAINE HCL (PF) 1 % IJ SOLN
INTRAMUSCULAR | Status: DC | PRN
  Administered 2018-10-16: 2 mL

## 2018-10-16 MED ORDER — FENTANYL CITRATE (PF) 100 MCG/2ML IJ SOLN
INTRAMUSCULAR | Status: DC | PRN
  Administered 2018-10-16: 25 ug via INTRAVENOUS

## 2018-10-16 MED ORDER — APIXABAN 5 MG PO TABS: 5.0000 mg | ORAL_TABLET | Freq: Two times a day (BID) | ORAL | 0 refills | Status: DC

## 2018-10-16 MED ORDER — SODIUM CHLORIDE 0.9% FLUSH: 3.0000 mL | Freq: Two times a day (BID) | INTRAVENOUS | Status: DC

## 2018-10-16 MED ORDER — ONDANSETRON HCL 4 MG/2ML IJ SOLN
4.0000 mg | Freq: Four times a day (QID) | INTRAMUSCULAR | Status: DC | PRN
  Administered 2018-10-16: 4 mg via INTRAVENOUS
  Filled 2018-10-16: qty 2

## 2018-10-16 MED ORDER — HEPARIN (PORCINE) IN NACL 1000-0.9 UT/500ML-% IV SOLN
INTRAVENOUS | Status: AC
  Filled 2018-10-16: qty 1000

## 2018-10-16 MED ORDER — TRAMADOL HCL 50 MG PO TABS
50.0000 mg | ORAL_TABLET | ORAL | Status: AC
  Administered 2018-10-16: 50 mg via ORAL
  Filled 2018-10-16: qty 1

## 2018-10-16 MED ORDER — SODIUM CHLORIDE 0.9 % IV SOLN: 250.0000 mL | INTRAVENOUS | Status: DC | PRN

## 2018-10-16 MED ORDER — MORPHINE SULFATE (PF) 2 MG/ML IV SOLN
2.0000 mg | INTRAVENOUS | Status: AC
  Administered 2018-10-16: 2 mg via INTRAVENOUS
  Filled 2018-10-16: qty 1

## 2018-10-16 MED ORDER — SODIUM CHLORIDE 0.9% FLUSH: 3.0000 mL | INTRAVENOUS | Status: DC | PRN

## 2018-10-16 MED ORDER — VERAPAMIL HCL 2.5 MG/ML IV SOLN
INTRAVENOUS | Status: AC
  Filled 2018-10-16: qty 2

## 2018-10-16 MED ORDER — MIDAZOLAM HCL 2 MG/2ML IJ SOLN
INTRAMUSCULAR | Status: AC
  Filled 2018-10-16: qty 2

## 2018-10-16 MED ORDER — METFORMIN HCL 500 MG PO TABS: 500.0000 mg | ORAL_TABLET | Freq: Two times a day (BID) | ORAL | Status: DC

## 2018-10-16 MED ORDER — MIDAZOLAM HCL 2 MG/2ML IJ SOLN
INTRAMUSCULAR | Status: DC | PRN
  Administered 2018-10-16: 2 mg via INTRAVENOUS

## 2018-10-16 MED ORDER — HEPARIN (PORCINE) IN NACL 1000-0.9 UT/500ML-% IV SOLN
INTRAVENOUS | Status: DC | PRN
  Administered 2018-10-16 (×2): 500 mL

## 2018-10-16 MED ORDER — ASPIRIN 81 MG PO CHEW: 81.0000 mg | CHEWABLE_TABLET | ORAL | Status: DC

## 2018-10-16 MED ORDER — ACETAMINOPHEN 325 MG PO TABS
650.0000 mg | ORAL_TABLET | ORAL | Status: DC | PRN
  Administered 2018-10-16: 650 mg via ORAL
  Filled 2018-10-16: qty 2

## 2018-10-16 MED ORDER — SODIUM CHLORIDE 0.9 % WEIGHT BASED INFUSION
3.0000 mL/kg/h | INTRAVENOUS | Status: AC
  Administered 2018-10-16: 3 mL/kg/h via INTRAVENOUS

## 2018-10-16 MED ORDER — SODIUM CHLORIDE 0.9 % WEIGHT BASED INFUSION: 1.0000 mL/kg/h | INTRAVENOUS | Status: DC

## 2018-10-16 MED ORDER — FENTANYL CITRATE (PF) 100 MCG/2ML IJ SOLN
INTRAMUSCULAR | Status: AC
  Filled 2018-10-16: qty 2

## 2018-10-16 MED ORDER — TRAMADOL HCL 50 MG PO TABS: 50.0000 mg | ORAL_TABLET | Freq: Once | ORAL | Status: DC

## 2018-10-16 MED ORDER — NICOTINE 21 MG/24HR TD PT24
21.0000 mg | MEDICATED_PATCH | Freq: Every day | TRANSDERMAL | Status: DC
  Filled 2018-10-16: qty 1

## 2018-10-16 MED ORDER — HEPARIN SODIUM (PORCINE) 1000 UNIT/ML IJ SOLN
INTRAMUSCULAR | Status: AC
  Filled 2018-10-16: qty 1

## 2018-10-16 MED ORDER — SODIUM CHLORIDE 0.9 % IV SOLN: INTRAVENOUS | Status: AC

## 2018-10-16 SURGICAL SUPPLY — 13 items
CATH 5FR JL3.5 JR4 ANG PIG MP (CATHETERS) ×2 IMPLANT
CATH LAUNCHER 6FR EBU3.5 (CATHETERS) ×2 IMPLANT
DEVICE RAD COMP TR BAND LRG (VASCULAR PRODUCTS) ×2 IMPLANT
GLIDESHEATH SLEND SS 6F .021 (SHEATH) ×2 IMPLANT
GUIDEWIRE INQWIRE 1.5J.035X260 (WIRE) ×1 IMPLANT
GUIDEWIRE PRESSURE COMET II (WIRE) ×2 IMPLANT
INQWIRE 1.5J .035X260CM (WIRE) ×2
KIT HEART LEFT (KITS) ×2 IMPLANT
KIT HEMO VALVE WATCHDOG (MISCELLANEOUS) ×2 IMPLANT
PACK CARDIAC CATHETERIZATION (CUSTOM PROCEDURE TRAY) ×2 IMPLANT
SHEATH PROBE COVER 6X72 (BAG) ×2 IMPLANT
TRANSDUCER W/STOPCOCK (MISCELLANEOUS) ×2 IMPLANT
TUBING CIL FLEX 10 FLL-RA (TUBING) ×2 IMPLANT

## 2018-10-16 NOTE — Progress Notes (Addendum)
Pt complained of 9/10 headache upon arrival to unit post cath. No relief after food and drink and rest. Tylenol given with very little relief. Ice pack given to patient to place on head. Pain radiates from back of head, to top of head, to behind right eye. PA Vin paged. Orders given for Tramadol 50 mg. Pt stated pain was dulled but still at an 8/10. Now nauseous, Zofran given. New orders for morphine and nicotine patch given.  Patient pain level down to a 3. Vin PA notified and said pt is okay to go since pain level is down and neuro exam was fine. Pt informed to call office if pain comes back after D/C.

## 2018-10-16 NOTE — Discharge Instructions (Signed)
Drink plenty of fluids over next 48 hours and keep right wrist elevated at heart level for 24 hours ° °Radial Site Care ° °This sheet gives you information about how to care for yourself after your procedure. Your health care provider may also give you more specific instructions. If you have problems or questions, contact your health care provider. °What can I expect after the procedure? °After the procedure, it is common to have: °· Bruising and tenderness at the catheter insertion area. °Follow these instructions at home: °Medicines °· Take over-the-counter and prescription medicines only as told by your health care provider. °Insertion site care °· Follow instructions from your health care provider about how to take care of your insertion site. Make sure you: °? Wash your hands with soap and water before you change your bandage (dressing). If soap and water are not available, use hand sanitizer. °? Remove your dressing as told by your health care provider. In 24-48 hours °· Check your insertion site every day for signs of infection. Check for: °? Redness, swelling, or pain. °? Fluid or blood. °? Pus or a bad smell. °? Warmth. °· Do not take baths, swim, or use a hot tub until your health care provider approves. °· You may shower 24-48 hours after the procedure, or as directed by your health care provider. °? Remove the dressing and gently wash the site with plain soap and water. °? Pat the area dry with a clean towel. °? Do not rub the site. That could cause bleeding. °· Do not apply powder or lotion to the site. °Activity ° °· For 24 hours after the procedure, or as directed by your health care provider: °? Do not flex or bend the affected arm. °? Do not push or pull heavy objects with the affected arm. °? Do not drive yourself home from the hospital or clinic. You may drive 24 hours after the procedure unless your health care provider tells you not to. °? Do not operate machinery or power tools. °· Do not lift  anything that is heavier than 10 lb (4.5 kg), or the limit that you are told, until your health care provider says that it is safe. For 5 days °· Ask your health care provider when it is okay to: °? Return to work or school. °? Resume usual physical activities or sports. °? Resume sexual activity. °General instructions °· If the catheter site starts to bleed, raise your arm and put firm pressure on the site. If the bleeding does not stop, get help right away. This is a medical emergency. °· If you went home on the same day as your procedure, a responsible adult should be with you for the first 24 hours after you arrive home. °· Keep all follow-up visits as told by your health care provider. This is important. °Contact a health care provider if: °· You have a fever. °· You have redness, swelling, or yellow drainage around your insertion site. °Get help right away if: °· You have unusual pain at the radial site. °· The catheter insertion area swells very fast. °· The insertion area is bleeding, and the bleeding does not stop when you hold steady pressure on the area. °· Your arm or hand becomes pale, cool, tingly, or numb. °These symptoms may represent a serious problem that is an emergency. Do not wait to see if the symptoms will go away. Get medical help right away. Call your local emergency services (911 in the [place] Do not   drive yourself to the hospital. °Summary °· After the procedure, it is common to have bruising and tenderness at the site. °· Follow instructions from your health care provider about how to take care of your radial site wound. Check the wound every day for signs of infection. °· Do not lift anything that is heavier than 10 lb (4.5 kg), or the limit that you are told, until your health care provider says that it is safe. °This information is not intended to replace advice given to you by your health care provider. Make sure you discuss any questions you have with your health care  provider. °Document Released: 08/31/2010 Document Revised: 09/03/2017 Document Reviewed: 09/03/2017 °Elsevier Interactive Patient Education © 2019 [employer] ° °

## 2018-10-16 NOTE — Interval H&P Note (Signed)
Cath Lab Visit (complete for each Cath Lab visit)  Clinical Evaluation Leading to the Procedure:   ACS: No.  Non-ACS:    Anginal Classification: CCS III  Anti-ischemic medical therapy: Minimal Therapy (1 class of medications)  Non-Invasive Test Results: Low-risk stress test findings: cardiac mortality <1%/year  Prior CABG: No previous CABG      History and Physical Interval Note:  10/16/2018 8:38 AM  Ian [name]  has presented today for surgery, with the diagnosis of cp  The various methods of treatment have been discussed with the patient and family. After consideration of risks, benefits and other options for treatment, the patient has consented to  Procedure(s): LEFT HEART CATH AND CORONARY ANGIOGRAPHY (N/A) as a surgical intervention .  The patient's history has been reviewed, patient examined, no change in status, stable for surgery.  I have reviewed the patient's chart and labs.  Questions were answered to the patient's satisfaction.     Lance Muss

## 2018-10-19 ENCOUNTER — Encounter (HOSPITAL_COMMUNITY): Payer: Self-pay | Admitting: Interventional Cardiology

## 2018-10-22 MED FILL — Heparin Sodium (Porcine) Inj 1000 Unit/ML: INTRAMUSCULAR | Qty: 10 | Status: AC

## 2018-10-26 ENCOUNTER — Telehealth: Payer: Self-pay | Admitting: Physician Assistant

## 2018-10-26 NOTE — Progress Notes (Signed)
Cardiology Office Note    Date:  10/27/2018   ID:  Dreven Alteri, DOB 1971-06-08, MRN [id number]  PCP:  [name], MD  Cardiologist: Tobias Alexander, MD EPS: None  Chief Complaint  Patient presents with   Hospitalization Follow-up    History of Present Illness:  Ian [name] is a 48 y.o. male with history of CAD status post NSTEMI 2016 treated with DES to the RCA with residual 25% mid circumflex, LVEF 45 to 50%.  He also had atrial fibrillation with RVR at that time, CHA2DS2-VASc was 2 and he was on aspirin Plavix and Eliquis for 30 days and then aspirin was stopped.  Abnormal stress test 2018 for possible inferior ischemia and cardiac cath showed 80% in-stent restenosis of the RCA treated with balloon angioplasty.  There was also a 90% mid RCA unable to cross with the balloon so underwent staged orbital atherectomy and DES placement 09/05/2016.  EF 40 to 45% at that time.  Patient also has hypertension, DM type II, HLD, tobacco abuse and obesity.  03/2018 overnight admission at [facility] CTA negative for PE, Lexiscan no ischemia.  Holter monitor 04/2018 showed atrial fibrillation 80% a lot of the time  times RVR metoprolol increased to 50 mg twice daily and chest pain resolved.  Patient saw Nada Boozer, NP 10/12/2018 complaining of increased chest pain off and on and increased dyspnea on exertion.  His diltiazem was stopped and beta-blocker increased.  She discussed with Dr. [name] who recommended cardiac catheterization.  Eliquis was held for 3 doses.  Atrial fibrillation was controlled although she felt he might have increased heart rate with activity.  Cardiac cath showed widely patent stents with only mild restenosis, 50% mid circumflex nonsignificant by DFR, proximal LAD to mid LAD 25%, no aortic valve stenosis.  Medical therapy recommended.  Patient comes in accompanied his wife. Still having sensation of squeezing left chest-occurs at rest or with exertion. Doesn't make him stop  what he's doing. He does have chronic dyspnea on exertion. Can last all day or just minutes. Similar to when he gets a chest cold. Still smoking 1-1 1/2 ppd. Wheezes some. Had nodule on [facility] at [facility]. Wears a fitbit and HR hasn't gotten too high recently on current doses of metoprolol.  Past Medical History:  Diagnosis Date   Arthritis    "left ankle" (09/04/2016)   Cardiomyopathy (HCC)    a. EF 40-45%   Chest pain 04/22/2017   Complication of anesthesia    "hard to get me out of it sometimes; usually takes me longer to get under"   Coronary artery disease    a. multiple PCIs to RCA.   Essential hypertension 12/19/2015   Hyperlipidemia 05/21/2015   Hypertension    NSTEMI (non-ST elevated myocardial infarction) (HCC) 05/2015   [name] 05/23/2015 "minor one"   PAF (paroxysmal atrial fibrillation) (HCC)    Tobacco abuse 05/21/2015   Type II diabetes mellitus [facility])     Past Surgical History:  Procedure Laterality Date   CARDIAC CATHETERIZATION  2000, 2005   "less than 20% blockage"   CARDIAC CATHETERIZATION N/A 05/22/2015   Procedure: Left Heart Cath and Coronary Angiography;  Surgeon: [name], MD;  Location: [facility] INVASIVE CV LAB;  Service: Cardiovascular;  Laterality: N/A;   CARDIAC CATHETERIZATION N/A 05/22/2015   Procedure: Coronary Stent Intervention;  Surgeon: [name], MD;  Location: [facility] INVASIVE CV LAB;  Service: Cardiovascular;  Laterality: N/A;   CARDIAC CATHETERIZATION N/A 09/04/2016   Procedure: Left  Heart Cath and Coronary Angiography;  Surgeon: [name], MD;  Location: [facility] INVASIVE CV LAB;  Service: Cardiovascular;  Laterality: N/A;   CARDIAC CATHETERIZATION N/A 09/04/2016   Procedure: Coronary Balloon Angioplasty;  Surgeon: [name], MD;  Location: [facility] INVASIVE CV LAB;  Service: Cardiovascular;  Laterality: N/A;   CARDIAC CATHETERIZATION N/A 09/05/2016   Procedure: Coronary Atherectomy;  Surgeon: Marykay Lex, MD;   Location: [facility] INVASIVE CV LAB;  Service: Cardiovascular;  Laterality: N/A;   CARDIAC CATHETERIZATION N/A 09/05/2016   Procedure: Coronary Stent Intervention;  Surgeon: Marykay Lex, MD;  Location: [facility] INVASIVE CV LAB;  Service: Cardiovascular;  Laterality: N/A;   CARDIAC CATHETERIZATION N/A 09/05/2016   Procedure: Temporary Pacemaker;  Surgeon: Marykay Lex, MD;  Location: [facility] INVASIVE CV LAB;  Service: Cardiovascular;  Laterality: N/A;   CORONARY ANGIOPLASTY     INTRAVASCULAR PRESSURE WIRE/FFR STUDY N/A 10/16/2018   Procedure: INTRAVASCULAR PRESSURE WIRE/FFR STUDY;  Surgeon: [name], MD;  Location: [facility] INVASIVE CV LAB;  Service: Cardiovascular;  Laterality: N/A;   LEFT HEART CATH AND CORONARY ANGIOGRAPHY N/A 10/16/2018   Procedure: LEFT HEART CATH AND CORONARY ANGIOGRAPHY;  Surgeon: [name], MD;  Location: [facility] INVASIVE CV LAB;  Service: Cardiovascular;  Laterality: N/A;   MULTIPLE TOOTH EXTRACTIONS  2013   "all of them"   TYMPANOSTOMY TUBE PLACEMENT Bilateral 1970s    Current Medications: Current Meds  Medication Sig   apixaban (ELIQUIS) 5 MG TABS tablet Take 1 tablet (5 mg total) by mouth 2 (two) times daily.   atorvastatin (LIPITOR) 80 MG tablet Take 1 tablet (80 mg total) by mouth daily at 6 PM.   Cinnamon 500 MG capsule Take 500 mg by mouth daily.   clopidogrel (PLAVIX) 75 MG tablet Take 1 tablet (75 mg total) by mouth daily.   dapagliflozin propanediol (FARXIGA) 5 MG TABS tablet Take 5 mg by mouth daily.   furosemide (LASIX) 20 MG tablet Take 1 tablet (20 mg total) by mouth daily.   glipiZIDE (GLUCOTROL) 5 MG tablet Take 5 mg by mouth 2 (two) times daily before a meal.    lisinopril (PRINIVIL,ZESTRIL) 2.5 MG tablet Take 1 tablet (2.5 mg total) by mouth daily.   metFORMIN (GLUCOPHAGE) 500 MG tablet Take 1 tablet (500 mg total) by mouth 2 (two) times daily with a meal.   metoprolol tartrate (LOPRESSOR) 100 MG tablet Take 100 mg by mouth 2 (two)  times daily.   nitroGLYCERIN (NITROSTAT) 0.4 MG SL tablet Place 1 tablet (0.4 mg total) under the tongue every 5 (five) minutes x 3 doses as needed for chest pain.   Omega-3 Fatty Acids (FISH OIL) 1200 MG CAPS Take 1,200 mg by mouth daily.      Allergies:   Patient has no known allergies.   Social History   Socioeconomic History   Marital status: Married    Spouse name: Not on file   Number of children: Not on file   Years of education: Not on file   Highest education level: Not on file  Occupational History   Not on file  Social Needs   Financial resource strain: Not on file   Food insecurity:    Worry: Not on file    Inability: Not on file   Transportation needs:    Medical: Not on file    Non-medical: Not on file  Tobacco Use   Smoking status: Current Every Day Smoker    Packs/day: 2.00    Years: 35.00  Pack years: 70.00    Types: Cigarettes, E-cigarettes   Smokeless tobacco: Never Used  Substance and Sexual Activity   Alcohol use: Yes    Alcohol/week: 0.0 standard drinks    Comment: 09/04/2016  "might have a few drinks/year; nothing since 05/2015"   Drug use: Yes    Types: Cocaine, Marijuana    Comment: 09/04/2016 "haven't touched drugs since 1988"   Sexual activity: Yes  Lifestyle   Physical activity:    Days per week: Not on file    Minutes per session: Not on file   Stress: Not on file  Relationships   Social connections:    Talks on phone: Not on file    Gets together: Not on file    Attends religious service: Not on file    Active member of club or organization: Not on file    Attends meetings of clubs or organizations: Not on file    Relationship status: Not on file  Other Topics Concern   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     Family History:  The patient's family history includes Diabetes in his maternal grandmother, mother, and sister; Heart attack in his father.     ROS:   Please see the history of present illness.    Review of Systems  Constitution: Negative.  HENT: Negative.   Cardiovascular: Positive for chest pain and dyspnea on exertion.  Respiratory: Negative.   Endocrine: Negative.   Hematologic/Lymphatic: Negative.   Musculoskeletal: Negative.   Gastrointestinal: Negative.   Genitourinary: Negative.   Neurological: Negative.    All other systems reviewed and are negative.   PHYSICAL EXAM:   VS:  BP 116/80    Pulse 94    Ht 5\' 11"  (1.803 m)    Wt 276 lb 1.9 oz (125.2 kg)    SpO2 95%    BMI 38.51 kg/m   Physical Exam  GEN: Obese, in no acute distress  Neck: no JVD, carotid bruits, or masses Cardiac:RRR; no murmurs, rubs, or gallops  Respiratory: Decreased breath sounds but clear to auscultation bilaterally, normal work of breathing GI: soft, nontender, nondistended, + BS Ext: without cyanosis, clubbing, or edema, Good distal pulses bilaterally Neuro:  Alert and Oriented x 3 Psych: euthymic mood, full affect  Wt Readings from Last 3 Encounters:  10/27/18 276 lb 1.9 oz (125.2 kg)  10/16/18 275 lb (124.7 kg)  10/12/18 276 lb 12.8 oz (125.6 kg)      Studies/Labs Reviewed:   EKG:  EKG is not ordered today.  Recent Labs: 10/12/2018: BUN 18; Creatinine, Ser 1.18; Hemoglobin 17.6; Platelets 154; Potassium 4.3; Sodium 141   Lipid Panel    Component Value Date/Time   CHOL 144 05/05/2017 0744   TRIG 291 (H) 05/05/2017 0744   HDL 29 (L) 05/05/2017 0744   CHOLHDL 5.0 05/05/2017 0744   CHOLHDL 7.9 05/22/2015 0233   VLDL UNABLE TO CALCULATE IF TRIGLYCERIDE OVER 400 mg/dL 16/05/9603 5409   LDLCALC 57 05/05/2017 0744    Additional studies/ records that were reviewed today include:  Cardiac cath 10/16/18  Previously placed Prox RCA to Mid RCA drug eluting stents are widely patent, with only mild restenosis.  Mid Cx lesion is 50% stenosed. Not significant by DFR.  Prox LAD to Mid LAD lesion is 25% stenosed.  LV end diastolic  pressure is normal.  There is no aortic valve stenosis.   COntinue aggressive medical therapy.  Echo 10/02/18 Summary:  1. The study quality is technically difficult, however, Definity used to enhance left ventricular visualization and quality. 2. The left ventricular chamber size is normal. 3. There is mildly decreased left ventricular systolic function. Mild global hypokinesis of the left ventricle is observed. The ejection fraction is estimated to be 40%. 4. There is mild mitral regurgitation observed.   Findings       Technical Comments: The study quality is technically difficult, however, Definity used to enhance left ventricular visualization and quality.  The study is technically limited due to patient body habitus.     Left Ventricle: The left ventricular chamber size is normal.  There is mildly decreased left ventricular systolic function.  Mild global hypokinesis of the left ventricle is observed.  The ejection fraction is estimated to be 40%.  Left ventricular diastolic function is unable to be assessed due to arrhythmia.    Left Atrium: The left atrial chamber size is normal.    Right Ventricle: The right ventricular cavity size is normal.  The right ventricular global systolic function is normal.    Right Atrium: The right atrium is normal.    Aortic Valve: The aortic valve is not well visualized.  There is no evidence of aortic regurgitation.    Mitral Valve: The mitral valve leaflets appear normal.  There is mild mitral regurgitation observed.    Tricuspid Valve: The tricuspid valve leaflets are morphologically normal.  There is trivial tricuspid regurgitation.    Pulmonic Valve: The pulmonic valve is not well visualized.  There is no evidence of pulmonic regurgitation.    Pericardium: The pericardium is not well visualized.    Aorta: There is no dilatation of the ascending aorta.    Pulmonary Artery: The main pulmonary artery is not  well visualized.    Venous: The inferior vena cava is dilated with <50% inspiratory collapse, suggestive of an elevated right atrial pressure ( ).    Contrast: Definity was used to optimize study.    Measurements  Chambers Name                         Value         Normal Range      IVSd (2D)                    0.92 cm       (0.6 - 1.1)       LVIDd (2D)                   4.94 cm       -                  LVIDs (2D)                   4.16 cm       (2.2 - 4)         LVPWd (2D)                   0.94 cm       (0.6 - 1.1)       Ao root diameter (2D)        3.1 cm        -                  Ascending Ao  3.4 cm        -                  LA dimension 2D              3.7 cm        -                  LA ESV SP 4CH (MOD)          43.1 ml       -                  LA ESV SP 2CH (MOD)          31 ml         -                  LA ESV BP (MOD)              37 ml         -                  LA ESV BP (MOD) index        15.4 ml/m2    (16 - 28)         BP EF (MOD)                  39.3 %        (55 - 80)         LV FS (Teichholz) (2D)       15.8 %        (20 - 80)          Mitral Valve Name                         Value         Normal Range      MV E-wave Vmax               1.02 m/sec    -                  LV E:e' lateral ratio        6.9 ratio     -                  LV E:e' septal ratio         9.5 ratio     -                  MV PHT                       67 msec       -                  MVA (PHT)                    3.28 cm2      (4 - 6)          CT at [facility] 04/2018 IMPRESSION Impression:  No evidence for pulmonary emboli.  Mildly prominent right hilar lymph node and small right lung nodule nonspecific. Follow-up could be obtained to assure stability.  ASSESSMENT:    1. Coronary artery disease due to lipid rich plaque   2. PAF (paroxysmal atrial fibrillation) (HCC)   3. Ischemic cardiomyopathy  4. Essential hypertension   5. Mixed hyperlipidemia   6. Tobacco abuse     7. Dyspnea on exertion   8. Lung nodules   9. Pulmonary emphysema, unspecified emphysema type (HCC)      PLAN:  In order of problems listed above:  CAD with history of non-STEMI 2016 treated with DES to the RCA, balloon angioplasty to the RCA for 80% in-stent restenosis 2018 also staged orbital atherectomy and DES to the mid RCA 09/05/2016 LVEF 40 to 45%.  Low risk Myoview 04/2017, persistent chest pain and repeat cardiac catheterization 10/16/2018 with widely patent stents in the proximal RCA to mid RCA and only mild restenosis in the mid circumflex 50% not significant by DFR.  Proximal LAD to mid LAD 25%.  Medical therapy recommended.  Patient with constant mild low-grade left chest pain that he thinks is more pulmonary related.  Persistent atrial fibrillation with question of rate control but patient going to keep his Fitbit on regularly and keep track of his heart rates to make sure does not go above 140 with exertion.  He is asking to see someone in the A. fib clinic to see if this could be causing some of his symptoms and effects.  He does have normal left atrial size.  He has been in atrial fibrillation pretty consistently since September 2019.  Ischemic cardiomyopathy ejection fraction 40% no heart failure on exam.  Hypertension blood pressure well controlled  Hyperlipidemia on Lipitor  Tobacco abuse continues to smoke 1 to 1-1/2 packs of cigarettes daily.  Long discussion about the importance of smoking cessation especially given the fact that he has diabetes, coronary artery disease and pulmonary nodules on prior CT.  He says he is going to try to quit.  Will refer to pulmonary.  Lung nodules and emphysema on prior chest x-ray and CT done at [facility] and is in care everywhere.  Referring to pulmonary.  Medication Adjustments/Labs and Tests Ordered: Current medicines are reviewed at length with the patient today.  Concerns regarding medicines are outlined above.  Medication  changes, Labs and Tests ordered today are listed in the Patient Instructions below. Patient Instructions  Medication Instructions:  Your physician recommends that you continue on your current medications as directed. Please refer to the Current Medication list given to you today.  If you need a refill on your cardiac medications before your next appointment, please call your pharmacy.   Lab work: None Ordered  If you have labs (blood work) drawn today and your tests are completely normal, you will receive your results only by:  MyChart Message (if you have MyChart) OR  A paper copy in the mail If you have any lab test that is abnormal or we need to change your treatment, we will call you to review the results.  Testing/Procedures: None ordered  Follow-Up: You have been referred to Pulmonology  You have been referred to the Atrial Fibrillation Clinic   At [facility], you and your health needs are our priority.  As part of our continuing mission to provide you with exceptional heart care, we have created designated Provider Care Teams.  These Care Teams include your primary Cardiologist (physician) and Advanced Practice Providers (APPs -  Physician Assistants and Nurse Practitioners) who all work together to provide you with the care you need, when you need it.  You will need a follow up appointment in 6 months  Please call our office 2 months in advance to schedule this appointment.  You may see Tobias Alexander, MD or one of the following Advanced Practice Providers on your designated Care Team:    Robbie Lis, [name]  Dayna Dunn, [name]  Jacolyn Reedy, [name]  Any Other Special Instructions Will Be Listed Below (If Applicable).  Your provider recommends that you maintain 150 minutes per week of moderate aerobic activity.  Steps to Quit Smoking  Smoking tobacco can be bad for your health. It can also affect almost every organ in your body. Smoking puts you and people  around you at risk for many serious long-lasting (chronic) diseases. Quitting smoking is hard, but it is one of the best things that you can do for your health. It is never too late to quit. What are the benefits of quitting smoking? When you quit smoking, you lower your risk for getting serious diseases and conditions. They can include:  Lung cancer or lung disease.  Heart disease.  Stroke.  Heart attack.  Not being able to have children (infertility).  Weak bones (osteoporosis) and broken bones (fractures). If you have coughing, wheezing, and shortness of breath, those symptoms may get better when you quit. You may also get sick less often. If you are pregnant, quitting smoking can help to lower your chances of having a baby of low birth weight. What can I do to help me quit smoking? Talk with your doctor about what can help you quit smoking. Some things you can do (strategies) include:  Quitting smoking totally, instead of slowly cutting back how much you smoke over a period of time.  Going to in-person counseling. You are more likely to quit if you go to many counseling sessions.  Using resources and support systems, such as: ? Agricultural engineer with a Veterinary surgeon. ? Phone quitlines. ? Automotive engineer. ? Support groups or group counseling. ? Text messaging programs. ? Mobile phone apps or applications.  Taking medicines. Some of these medicines may have nicotine in them. If you are pregnant or breastfeeding, do not take any medicines to quit smoking unless your doctor says it is okay. Talk with your doctor about counseling or other things that can help you. Talk with your doctor about using more than one strategy at the same time, such as taking medicines while you are also going to in-person counseling. This can help make quitting easier. What things can I do to make it easier to quit? Quitting smoking might feel very hard at first, but there is a lot that you can do to  make it easier. Take these steps:  Talk to your family and friends. Ask them to support and encourage you.  Call phone quitlines, reach out to support groups, or work with a Veterinary surgeon.  Ask people who smoke to not smoke around you.  Avoid places that make you want (trigger) to smoke, such as: ? Bars. ? Parties. ? Smoke-break areas at work.  Spend time with people who do not smoke.  Lower the stress in your life. Stress can make you want to smoke. Try these things to help your stress: ? Getting regular exercise. ? Deep-breathing exercises. ? Yoga. ? Meditating. ? Doing a body scan. To do this, close your eyes, focus on one area of your body at a time from head to toe, and notice which parts of your body are tense. Try to relax the muscles in those areas.  Download or buy apps on your mobile phone or tablet that can help you stick to your quit plan. There  are many free apps, such as QuitGuide from the [employer] developer(Centers for Disease Control and Prevention). You can find more support from smokefree.gov and other websites. This information is not intended to replace advice given to you by your health care provider. Make sure you discuss any questions you have with your health care provider. Document Released: 05/25/2009 Document Revised: 03/26/2016 Document Reviewed: 12/13/2014 Elsevier Interactive Patient Education  [address] Elsevier Inc.      Signed, Jacolyn ReedyMichele Shivaan Tierno, [place] JerseyPA-C  10/27/2018 12:12 PM    [facility] [address], [place], [place]  [address] Phone: [phone]; Fax: [phone]

## 2018-10-26 NOTE — Telephone Encounter (Signed)
Call patient to see if he would want to reschedule due to the coronavirus.  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  He still has atrial fibrillation and some other issues that need to be addressed and would like to keep his appointment.

## 2018-10-27 ENCOUNTER — Other Ambulatory Visit: Payer: Self-pay

## 2018-10-27 ENCOUNTER — Ambulatory Visit: Payer: 59 | Admitting: Physician Assistant

## 2018-10-27 ENCOUNTER — Encounter: Payer: Self-pay | Admitting: Physician Assistant

## 2018-10-27 VITALS — BP 116/80 | HR 94 | Ht 71.0 in | Wt 276.1 lb

## 2018-10-27 DIAGNOSIS — I255 Ischemic cardiomyopathy: Secondary | ICD-10-CM | POA: Diagnosis not present

## 2018-10-27 DIAGNOSIS — I480 Paroxysmal atrial fibrillation: Secondary | ICD-10-CM | POA: Diagnosis not present

## 2018-10-27 DIAGNOSIS — I10 Essential (primary) hypertension: Secondary | ICD-10-CM

## 2018-10-27 DIAGNOSIS — R0600 Dyspnea, unspecified: Secondary | ICD-10-CM | POA: Insufficient documentation

## 2018-10-27 DIAGNOSIS — I2510 Atherosclerotic heart disease of native coronary artery without angina pectoris: Secondary | ICD-10-CM

## 2018-10-27 DIAGNOSIS — E782 Mixed hyperlipidemia: Secondary | ICD-10-CM

## 2018-10-27 DIAGNOSIS — I2583 Coronary atherosclerosis due to lipid rich plaque: Secondary | ICD-10-CM

## 2018-10-27 DIAGNOSIS — Z720 Tobacco use: Secondary | ICD-10-CM

## 2018-10-27 DIAGNOSIS — J439 Emphysema, unspecified: Secondary | ICD-10-CM

## 2018-10-27 DIAGNOSIS — R0609 Other forms of dyspnea: Secondary | ICD-10-CM

## 2018-10-27 DIAGNOSIS — R918 Other nonspecific abnormal finding of lung field: Secondary | ICD-10-CM

## 2018-10-27 NOTE — Patient Instructions (Signed)
Medication Instructions:  Your physician recommends that you continue on your current medications as directed. Please refer to the Current Medication list given to you today.  If you need a refill on your cardiac medications before your next appointment, please call your pharmacy.   Lab work: None Ordered  If you have labs (blood work) drawn today and your tests are completely normal, you will receive your results only by: Marland Kitchen MyChart Message (if you have MyChart) OR . A paper copy in the mail If you have any lab test that is abnormal or we need to change your treatment, we will call you to review the results.  Testing/Procedures: None ordered  Follow-Up: You have been referred to Pulmonology  You have been referred to the Atrial Fibrillation Clinic   At [facility], you and your health needs are our priority.  As part of our continuing mission to provide you with exceptional heart care, we have created designated Provider Care Teams.  These Care Teams include your primary Cardiologist (physician) and Advanced Practice Providers (APPs -  Physician Assistants and Nurse Practitioners) who all work together to provide you with the care you need, when you need it. . You will need a follow up appointment in 6 months  Please call our office 2 months in advance to schedule this appointment.  You may see Tobias Alexander, MD or one of the following Advanced Practice Providers on your designated Care Team:   . Robbie Lis, [name] . Dayna Dunn, [name] . Jacolyn Reedy, [name]  Any Other Special Instructions Will Be Listed Below (If Applicable).  Your provider recommends that you maintain 150 minutes per week of moderate aerobic activity.  Steps to Quit Smoking  Smoking tobacco can be bad for your health. It can also affect almost every organ in your body. Smoking puts you and people around you at risk for many serious long-lasting (chronic) diseases. Quitting smoking is hard, but it is one of  the best things that you can do for your health. It is never too late to quit. What are the benefits of quitting smoking? When you quit smoking, you lower your risk for getting serious diseases and conditions. They can include:  Lung cancer or lung disease.  Heart disease.  Stroke.  Heart attack.  Not being able to have children (infertility).  Weak bones (osteoporosis) and broken bones (fractures). If you have coughing, wheezing, and shortness of breath, those symptoms may get better when you quit. You may also get sick less often. If you are pregnant, quitting smoking can help to lower your chances of having a baby of low birth weight. What can I do to help me quit smoking? Talk with your doctor about what can help you quit smoking. Some things you can do (strategies) include:  Quitting smoking totally, instead of slowly cutting back how much you smoke over a period of time.  Going to in-person counseling. You are more likely to quit if you go to many counseling sessions.  Using resources and support systems, such as: ? Agricultural engineer with a Veterinary surgeon. ? Phone quitlines. ? Automotive engineer. ? Support groups or group counseling. ? Text messaging programs. ? Mobile phone apps or applications.  Taking medicines. Some of these medicines may have nicotine in them. If you are pregnant or breastfeeding, do not take any medicines to quit smoking unless your doctor says it is okay. Talk with your doctor about counseling or other things that can help you. Talk with  your doctor about using more than one strategy at the same time, such as taking medicines while you are also going to in-person counseling. This can help make quitting easier. What things can I do to make it easier to quit? Quitting smoking might feel very hard at first, but there is a lot that you can do to make it easier. Take these steps:  Talk to your family and friends. Ask them to support and encourage you.   Call phone quitlines, reach out to support groups, or work with a Veterinary surgeon.  Ask people who smoke to not smoke around you.  Avoid places that make you want (trigger) to smoke, such as: ? Bars. ? Parties. ? Smoke-break areas at work.  Spend time with people who do not smoke.  Lower the stress in your life. Stress can make you want to smoke. Try these things to help your stress: ? Getting regular exercise. ? Deep-breathing exercises. ? Yoga. ? Meditating. ? Doing a body scan. To do this, close your eyes, focus on one area of your body at a time from head to toe, and notice which parts of your body are tense. Try to relax the muscles in those areas.  Download or buy apps on your mobile phone or tablet that can help you stick to your quit plan. There are many free apps, such as QuitGuide from the [employer] developer for Disease Control and Prevention). You can find more support from smokefree.gov and other websites. This information is not intended to replace advice given to you by your health care provider. Make sure you discuss any questions you have with your health care provider. Document Released: 05/25/2009 Document Revised: 03/26/2016 Document Reviewed: 12/13/2014 Elsevier Interactive Patient Education  2019 [employer].

## 2018-11-16 ENCOUNTER — Ambulatory Visit (HOSPITAL_COMMUNITY): Admission: RE | Admit: 2018-11-16 | Payer: 59 | Source: Ambulatory Visit | Admitting: Nurse Practitioner

## 2018-11-16 ENCOUNTER — Other Ambulatory Visit: Payer: Self-pay

## 2018-11-18 ENCOUNTER — Telehealth: Payer: Self-pay | Admitting: Cardiology

## 2018-11-18 MED ORDER — HYDROMORPHONE HCL 1 MG/ML IJ SOLN
1.00 | INTRAMUSCULAR | Status: DC
Start: ? — End: 2018-11-18

## 2018-11-18 MED ORDER — GENERIC EXTERNAL MEDICATION
3.38 | Status: DC
Start: 2018-11-17 — End: 2018-11-18

## 2018-11-18 MED ORDER — LACTATED RINGERS IV SOLN
75.00 | INTRAVENOUS | Status: DC
Start: ? — End: 2018-11-18

## 2018-11-18 MED ORDER — INSULIN LISPRO 100 UNIT/ML ~~LOC~~ SOLN
SUBCUTANEOUS | Status: DC
Start: 2018-11-17 — End: 2018-11-18

## 2018-11-18 MED ORDER — MAGNESIUM HYDROXIDE 400 MG/5ML PO SUSP
30.00 | ORAL | Status: DC
Start: ? — End: 2018-11-18

## 2018-11-18 MED ORDER — DURASORB UNDERPAD MISC
4.00 | Status: DC
Start: ? — End: 2018-11-18

## 2018-11-18 MED ORDER — SODIUM CHLORIDE FLUSH 0.9 % IV SOLN
3.00 | INTRAVENOUS | Status: DC
Start: 2018-11-17 — End: 2018-11-18

## 2018-11-18 MED ORDER — ALUMINUM-MAGNESIUM-SIMETHICONE 200-200-20 MG/5ML PO SUSP
30.00 | ORAL | Status: DC
Start: ? — End: 2018-11-18

## 2018-11-18 MED ORDER — OXYCODONE HCL 5 MG PO TABS
5.00 | ORAL_TABLET | ORAL | Status: DC
Start: ? — End: 2018-11-18

## 2018-11-18 MED ORDER — ACETAMINOPHEN 325 MG PO TABS
650.00 | ORAL_TABLET | ORAL | Status: DC
Start: ? — End: 2018-11-18

## 2018-11-18 MED ORDER — METOPROLOL TARTRATE 50 MG PO TABS
100.00 | ORAL_TABLET | ORAL | Status: DC
Start: 2018-11-17 — End: 2018-11-18

## 2018-11-18 MED ORDER — DEXTROSE 50 % IV SOLN
25.00 | INTRAVENOUS | Status: DC
Start: ? — End: 2018-11-18

## 2018-11-18 MED ORDER — GLUCOSE 40 % PO GEL
15.00 | ORAL | Status: DC
Start: ? — End: 2018-11-18

## 2018-11-18 MED ORDER — POLYETHYLENE GLYCOL 3350 17 G PO PACK
17.00 | PACK | ORAL | Status: DC
Start: 2018-11-18 — End: 2018-11-18

## 2018-11-18 MED ORDER — CULTURELLE PO CAPS
1.00 | ORAL_CAPSULE | ORAL | Status: DC
Start: 2018-11-18 — End: 2018-11-18

## 2018-11-18 MED ORDER — DEXTROSE 50 % IV SOLN
50.00 | INTRAVENOUS | Status: DC
Start: ? — End: 2018-11-18

## 2018-11-18 MED ORDER — FUROSEMIDE 20 MG PO TABS
20.00 | ORAL_TABLET | ORAL | Status: DC
Start: 2018-11-18 — End: 2018-11-18

## 2018-11-18 MED ORDER — ONDANSETRON HCL 4 MG/2ML IJ SOLN
4.00 | INTRAMUSCULAR | Status: DC
Start: ? — End: 2018-11-18

## 2018-11-18 MED ORDER — OXYCODONE HCL 5 MG PO TABS
10.00 | ORAL_TABLET | ORAL | Status: DC
Start: ? — End: 2018-11-18

## 2018-11-18 MED ORDER — DIPHENHYDRAMINE HCL 50 MG/ML IJ SOLN
25.00 | INTRAMUSCULAR | Status: DC
Start: ? — End: 2018-11-18

## 2018-11-18 MED ORDER — LISINOPRIL 5 MG PO TABS
2.50 | ORAL_TABLET | ORAL | Status: DC
Start: 2018-11-18 — End: 2018-11-18

## 2018-11-18 MED ORDER — NITROGLYCERIN 0.4 MG SL SUBL
0.40 | SUBLINGUAL_TABLET | SUBLINGUAL | Status: DC
Start: ? — End: 2018-11-18

## 2018-11-18 NOTE — Telephone Encounter (Addendum)
Pt had emergent appendectomy along with umbilical hernia repair performed on 4/6 and the surgeon instructed to hold Eliquis until follow up on 4/14. Wife is questioning whether this is appropriate. Encouraged wife to contact surgeon's office to clarify holding for 7 days. Of note patient is also on plavix daily. Wife verbalizes understanding and will confirm with surgeon office.

## 2018-11-18 NOTE — Telephone Encounter (Signed)
Patient was just released from hospital, they had changed some of his medications.  She wants to verify his medications.

## 2018-11-23 ENCOUNTER — Ambulatory Visit (HOSPITAL_COMMUNITY)
Admission: RE | Admit: 2018-11-23 | Discharge: 2018-11-23 | Disposition: A | Discharge status: Home / Self Care | Payer: 59 | Source: Ambulatory Visit | Attending: Nurse Practitioner | Admitting: Nurse Practitioner

## 2018-11-23 ENCOUNTER — Telehealth (HOSPITAL_COMMUNITY): Payer: Self-pay | Admitting: *Deleted

## 2018-11-23 ENCOUNTER — Encounter (HOSPITAL_COMMUNITY): Payer: Self-pay | Admitting: Nurse Practitioner

## 2018-11-23 ENCOUNTER — Other Ambulatory Visit: Payer: Self-pay

## 2018-11-23 DIAGNOSIS — I4819 Other persistent atrial fibrillation: Secondary | ICD-10-CM

## 2018-11-23 NOTE — Telephone Encounter (Signed)
HR 77  No symptoms reported, No cardiac complaints.

## 2018-11-23 NOTE — Telephone Encounter (Signed)
Pt Eliquis is on hold since appendectomy and hernia repair one week ago, On hold until 11/24/2018.  Medications reconciled and notes put in on blood thinner.

## 2018-11-23 NOTE — Progress Notes (Signed)
Electrophysiology TeleHealth Note   Due to national recommendations of social distancing due to COVID 19, Audio/video telehealth visit is felt to be most appropriate for this patient at this time.  See MyChart message/consent below from today for patient consent regarding telehealth for the Atrial Fibrillation Clinic.    Date:  11/23/2018   ID:  Ian [name], DOB 1971/05/03, MRN [id number]  Location: home  Provider location: [address] [place], [place] [address] Evaluation Performed: New patient consult  PCP:  [name], [name], MD  Primary Cardiologist: none  Primary Electrophysiologist:Dr. Delton SeeNelson  AO:[id number]:prsisitent afib   History of Present Illness: Ian BalloonCraig [name] is a 48 y.o. male who presents via audio  conferencing for a telehealth visit today.   The patient is referred for new consultation regarding persistent afib by  Herma CarsonMichelle Lenze, PA.  He is a 48 y.o.obese male with history of CAD status post NSTEMI 2016 treated with DES to the RCA with residual 25% mid circumflex, LVEF 45 to 50%. Repeat cath 2018 showed 80% in-stent stenosis of the RCA treated with balloon angioplasty and 90% mid RCA unable to cross with the balloon so underwent staged orbital atherectomy and DES placement 09/05/2016.   He has some ongoing chest discomfort and shortness of breath at rest and with exertion. When he had f/u with Elon JesterMichele 3/17, he was referred for options to restore SR for afib, persisitnet since September of last year.  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 Comes home infrequently. He eats on the road and diet is very poor. He drinks 2 pots of coffee daily, as well as smoking around 2 pks cigarettes a day.No alcohol or drug use. His wife does state that he snores, some apnea witnessed. Had a sleep study years ago, has gained additional  weight since then. He could not tolerate cpap mask in the past. He was on  cardizem in the past, but stopped with EF at 40%. Metoprolol has been increased since then and by his fitbit, HR's are staying for the most part less than 100 bpm..  He  developed acute appendicitis 4/4 and had emergency surgery with repair of umbilical hernia   on 4/6 [facility] [place], [place]. He continued Plavix but surgeon wanted him to hold eliquis. He returns to surgeon tomorrow and thinks he will be able to restar then.  Today, he denies symptoms of palpitations, chest pain, shortness of breath, orthopnea, PND, lower extremity edema, claudication, dizziness, presyncope, syncope, bleeding, or neurologic sequela. The patient is tolerating medications without difficulties and is otherwise without complaint today.   he denies symptoms of cough, fevers, chills, or new SOB worrisome for COVID 19.     Atrial Fibrillation Risk Factors:  he does have symptoms or diagnosis of sleep apnea. he is not compliant with CPAP therapy. he does not have a history of rheumatic fever. he does not have a history of alcohol use. The patient does not have a history of early familial atrial fibrillation or other arrhythmias.  he has a BMI of There is no height or weight on file to calculate BMI.. There were no vitals filed for this visit.  Past Medical History:  Diagnosis Date  . Arthritis    "left ankle" (09/04/2016)  . Cardiomyopathy (HCC)    a. EF 40-45%  . Chest pain 04/22/2017  . Complication of anesthesia    "hard to  get me out of it sometimes; usually takes me longer to get under"  . Coronary artery disease    a. multiple PCIs to RCA.  . Essential hypertension 12/19/2015  . Hyperlipidemia 05/21/2015  . Hypertension   . NSTEMI (non-ST elevated myocardial infarction) (HCC) 05/2015   [name] 05/23/2015 "minor one"  . PAF (paroxysmal atrial fibrillation) (HCC)   . Tobacco abuse 05/21/2015  . Type II diabetes mellitus (HCC)    Past Surgical History:  Procedure Laterality Date  . CARDIAC  CATHETERIZATION  2000, 2005   "less than 20% blockage"  . CARDIAC CATHETERIZATION N/A 05/22/2015   Procedure: Left Heart Cath and Coronary Angiography;  Surgeon: [name], MD;  Location: [facility] INVASIVE CV LAB;  Service: Cardiovascular;  Laterality: N/A;  . CARDIAC CATHETERIZATION N/A 05/22/2015   Procedure: Coronary Stent Intervention;  Surgeon: [name], MD;  Location: [facility] INVASIVE CV LAB;  Service: Cardiovascular;  Laterality: N/A;  . CARDIAC CATHETERIZATION N/A 09/04/2016   Procedure: Left Heart Cath and Coronary Angiography;  Surgeon: [name], MD;  Location[facility] INVASIVE CV LAB;  Service: Cardiovascular;  Laterality: N/A;  . CARDIAC CATHETERIZATION N/A 09/04/2016   Procedure: Coronary Balloon Angioplasty;  Surgeon: [name], MD;  Location: [facility] INVASIVE CV LAB;  Service: Cardiovascular;  Laterality: N/A;  . CARDIAC CATHETERIZATION N/A 09/05/2016   Procedure: Coronary Atherectomy;  Surgeon: Marykay Lex, MD;  Location: [facility] INVASIVE CV LAB;  Service: Cardiovascular;  Laterality: N/A;  . CARDIAC CATHETERIZATION N/A 09/05/2016   Procedure: Coronary Stent Intervention;  Surgeon: Marykay Lex, MD;  Location: [facility] INVASIVE CV LAB;  Service: Cardiovascular;  Laterality: N/A;  . CARDIAC CATHETERIZATION N/A 09/05/2016   Procedure: Temporary Pacemaker;  Surgeon: Marykay Lex, MD;  Location: [facility]VAS[facility] CV LAB;  Service: Cardiovascular;  Laterality: N/A;  . CORONARY ANGIOPLASTY    . INTRAVASCULAR PRESSURE WIRE/FFR STUDY N/A 10/16/2018   Procedure: INTRAVASCULAR PRESSURE WIRE/FFR STUDY;  Surgeon: [name], MD;  Location: [facility] INVASIVE CV LAB;  Service: Cardiovascular;  Laterality: N/A;  . LEFT HEART CATH AND CORONARY ANGIOGRAPHY N/A 10/16/2018   Procedure: LEFT HEART CATH AND CORONARY ANGIOGRAPHY;  Surgeon: [name], MD;  Location: [facility] INVASIVE CV LAB;  Service: Cardiovascular;  Laterality: N/A;  . MULTIPLE TOOTH EXTRACTIONS  2013   "all of them"   . TYMPANOSTOMY TUBE PLACEMENT Bilateral 1970s     Current Outpatient Medications  Medication Sig Dispense Refill  . apixaban (ELIQUIS) 5 MG TABS tablet Take 1 tablet (5 mg total) by mouth 2 (two) times daily. (Patient not taking: Reported on 11/23/2018) 60 tablet 0  . atorvastatin (LIPITOR) 80 MG tablet Take 1 tablet (80 mg total) by mouth daily at 6 PM. 90 tablet 2  . Cinnamon 500 MG capsule Take 500 mg by mouth 2 (two) times daily.     . clopidogrel (PLAVIX) 75 MG tablet Take 1 tablet (75 mg total) by mouth daily. 90 tablet 3  . dapagliflozin propanediol (FARXIGA) 5 MG TABS tablet Take 5 mg by mouth daily.    . furosemide (LASIX) 20 MG tablet Take 1 tablet (20 mg total) by mouth daily. 90 tablet 3  . glipiZIDE (GLUCOTROL) 5 MG tablet Take 5 mg by mouth 2 (two) times daily before a meal.     . lisinopril (PRINIVIL,ZESTRIL) 2.5 MG tablet Take 1 tablet (2.5 mg total) by mouth daily. 90 tablet 2  . metFORMIN (GLUCOPHAGE) 500 MG tablet Take 1 tablet (500 mg total) by mouth  2 (two) times daily with a meal.    . metoprolol tartrate (LOPRESSOR) 100 MG tablet Take 100 mg by mouth 2 (two) times daily.    . nitroGLYCERIN (NITROSTAT) 0.4 MG SL tablet Place 1 tablet (0.4 mg total) under the tongue every 5 (five) minutes x 3 doses as needed for chest pain. (Patient not taking: Reported on 11/23/2018) 25 tablet 12  . Omega-3 Fatty Acids (FISH OIL) 1200 MG CAPS Take 1,200 mg by mouth daily.      No current facility-administered medications for this encounter.     Allergies:   Patient has no known allergies.   Social History:  The patient  reports that he has been smoking cigarettes and e-cigarettes. He has a 70.00 pack-year smoking history. He has never used smokeless tobacco. He reports current alcohol use. He reports current drug use. Drugs: Cocaine and Marijuana.   Family History:  The patient's  family history includes Diabetes in his maternal grandmother, mother, and sister; Heart attack in his  father.    ROS:  Please see the history of present illness.   All other systems are personally reviewed and negative.   Exam: NA   Recent Labs: 10/12/2018: BUN 18; Creatinine, Ser 1.18; Hemoglobin 17.6; Platelets 154; Potassium 4.3; Sodium 141  personally reviewed    Other studies personally reviewed: Additional studies/ records that were reviewed today include:echo,labs, ekg's cardiac cath reports, cardiac notes and recent surgery notes.  Prior radiographs: 04/2017-Attenuated upper lobe pulmonary vessels consistent with emphysematous change. 2. No active cardiopulmonary disease.   Last EKG showed afib at 73 bpm, qtc 453 ms. All ekg's in Epic since 04/2017 shoHolter minitoring for total of 12 days.  Atrial fibrillation for 85% of monitoring time.  Frequent PACs.  Everage heart rate 88 BPM but frequent episodes of atrial fibrillation ith RVR.   Holter monitor-03/2018-Atrial fibrillation for 85% of monitoring time. Everage heart rate 88 BPM but frequent episodes of atrial fibrillation ith RVR.w rate controlled afib  The patient presents wearable device technology (fitbit)report for my review today. States HR's are below 100 bpm at rest.     ASSESSMENT AND PLAN:  1. Persistent  atrial fibrillation Options to restore SR discussed with pt and wife today during audio visit discussed included Tikosyn(dofetilide) or sotalol. Hospitalization for either one of these is currently on hold with Covid 19.  He will check on price of tikosyn. He is not an candidate for an ablation at this time as he has not tried AAD therapy and his lifestyle issues It will be seen if we can restore SR, and if  restoration of SR brings him any relief of chest pain or exertional dyspnea. He has other risk factors that may contribute to his symptoms. He was encouraged to resume eliquis as soon as possible.Sees surgeon tomorrow and hospes to resume.   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  Social distancing reinforced today.   Follow-up: he will be put on our list to address hospitalization for Tikosyn when restrictions lifted Currently he is well rate controlled  Current medicines are reviewed at length with the patient today.   The patient does not have concerns regarding his medicines.  The following changes were made today:  none  Labs/ tests ordered today include: none No orders of the defined types were placed in this encounter.   Patient Risk:  after full review of this patients clinical status, I feel that they are at high risk at this time.   Today, I have spent 45 minutes with the patient with telehealth technology discussing the above plan.   Don Perking NP  11/23/2018 2:20 PM  Afib Clinic [facility] [address] [place], [place] [address] [phone]   I hereby voluntarily request, consent and authorize the Atrial Fibrillation Clinic and its employed or contracted physicians, physician assistants, nurse practitioners or other licensed health care  professionals (the Practitioner), to provide me with telemedicine health care services (the "Services") as deemed necessary by the treating Practitioner. I acknowledge and consent to receive the Services by the Practitioner via telemedicine. I understand that the telemedicine visit will involve communicating with the Practitioner through live audiovisual communication technology and the disclosure of certain medical information by electronic transmission. I acknowledge that I have been given the opportunity to request an in-person assessment or other available alternative prior to the telemedicine visit and am voluntarily participating in the telemedicine visit.   I understand that I have the right to withhold or withdraw my consent to the use of telemedicine in the course of my care at any time, without affecting my right to future care or treatment, and that the Practitioner or I may terminate the telemedicine visit at any time. I understand that I have the right to inspect all information obtained and/or recorded in the course of the telemedicine visit and may receive copies of available information for a reasonable fee.  I understand that some of the potential risks of receiving the Services via telemedicine include:   Delay or interruption in medical evaluation due to technological equipment failure or disruption;  Information transmitted may not be sufficient (e.g. poor resolution of images) to allow for appropriate medical decision making by the Practitioner; and/or  In rare instances, security protocols could fail, causing a breach of personal health information.   Furthermore, I acknowledge that it is my responsibility to provide information about my medical history, conditions and care that is complete and accurate to the best of my ability. I acknowledge that Practitioner's advice, recommendations, and/or decision may be based on factors not within their control, such as incomplete or inaccurate  data provided by me or distortions of diagnostic images or specimens that may result from electronic transmissions. I understand that the practice of medicine is not an exact science and that Practitioner makes no warranties or guarantees regarding treatment outcomes. I acknowledge that I will receive a copy of this consent concurrently upon execution via email to the email address I last provided but may also request a printed copy by calling the  office of the Atrial Fibrillation Clinic.  I understand that my insurance will be billed for this visit.   I have read or had this consent read to me.  I understand the contents of this consent, which adequately explains the benefits and risks of the Services being provided via telemedicine.  I have been provided ample opportunity to ask questions regarding this consent and the Services and have had my questions answered to my satisfaction.  I give my informed consent for the services to be provided through the use of telemedicine in my medical care  By participating in this telemedicine visit I agree to the above.

## 2018-11-27 ENCOUNTER — Telehealth: Payer: Self-pay | Admitting: Cardiology

## 2018-11-27 NOTE — Telephone Encounter (Addendum)
Patient instructed to resume eliquis today as surgeon released him to restart. Pt states since his appt earlier this week he has noticed his HRs are much better controlled in the 50-70 range and his energy/breathing is much better. Wonders if he maybe back in normal rhythm. Pt verbalized understanding to resume eliquis today.

## 2018-11-27 NOTE — Telephone Encounter (Signed)
Pt had surgery 2 weeks ago for ruptured appendix and hernia repair. He has been off of his Eliquis for two weeks, and he wants to know if or when Dr. [name] wants him to start taking the medication again. He said he feels great not being on the medication.

## 2019-01-29 ENCOUNTER — Encounter (HOSPITAL_COMMUNITY): Payer: Self-pay

## 2019-03-22 ENCOUNTER — Telehealth: Payer: Self-pay | Admitting: Cardiology

## 2019-03-22 MED ORDER — APIXABAN 5 MG PO TABS: 5.0000 mg | ORAL_TABLET | Freq: Two times a day (BID) | ORAL | 1 refills | Status: DC

## 2019-03-22 NOTE — Telephone Encounter (Signed)
New message    *STAT* If patient is at the pharmacy, call can be transferred to refill team.   1. Which medications need to be refilled? (please list name of each medication and dose if known) apixaban (ELIQUIS) 5 MG TABS tablet  2. Which pharmacy/location (including street and city if local pharmacy) is medication to be sent to?[place], [place]  3. Do they need a 30 day or 90 day supply? [place]

## 2019-03-22 NOTE — Telephone Encounter (Signed)
Pt last saw Roderic Palau, NP on 11/23/18, last labs 10/12/18 Creat 1.18, age 48, weight 125.2kg, based on specified criteria pt is on appropriate dosage of Eliquis 5mg  BID.  Will refill rx.

## 2019-06-01 ENCOUNTER — Other Ambulatory Visit: Payer: Self-pay | Admitting: Cardiology

## 2019-06-01 ENCOUNTER — Telehealth: Payer: Self-pay | Admitting: Cardiology

## 2019-06-01 MED ORDER — LISINOPRIL 2.5 MG PO TABS: 2.5000 mg | ORAL_TABLET | Freq: Every day | ORAL | 0 refills | Status: DC

## 2019-06-01 MED ORDER — CLOPIDOGREL BISULFATE 75 MG PO TABS: ORAL_TABLET | ORAL | 0 refills | Status: DC

## 2019-06-01 NOTE — Telephone Encounter (Signed)
°*  STAT* If patient is at the pharmacy, call can be transferred to refill team.   1. Which medications need to be refilled? (please list name of each medication and dose if known)  clopidogrel (PLAVIX) 75 MG tablet lisinopril (PRINIVIL,ZESTRIL) 2.5 MG tablet  2. Which pharmacy/location (including street and city if local pharmacy) is medication to be sent to?  [place], [place] - [place]  3. Do they need a 30 day or 90 day supply? 90  Pt is out of refills for these medications. 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

## 2019-06-01 NOTE — Telephone Encounter (Signed)
Clopidogrel and Lisinopril refills sent to [facility] in [place], [place] to fill for a 90 day supply on each as requested. Marland Kitchen

## 2019-07-20 ENCOUNTER — Ambulatory Visit: Payer: 59 | Admitting: Cardiology

## 2019-07-20 ENCOUNTER — Other Ambulatory Visit: Payer: Self-pay

## 2019-07-20 ENCOUNTER — Encounter: Payer: Self-pay | Admitting: Cardiology

## 2019-07-20 ENCOUNTER — Encounter: Payer: Self-pay | Admitting: *Deleted

## 2019-07-20 VITALS — BP 126/84 | HR 52 | Ht 71.0 in | Wt 280.2 lb

## 2019-07-20 DIAGNOSIS — I2510 Atherosclerotic heart disease of native coronary artery without angina pectoris: Secondary | ICD-10-CM

## 2019-07-20 DIAGNOSIS — E118 Type 2 diabetes mellitus with unspecified complications: Secondary | ICD-10-CM

## 2019-07-20 DIAGNOSIS — E782 Mixed hyperlipidemia: Secondary | ICD-10-CM

## 2019-07-20 DIAGNOSIS — I10 Essential (primary) hypertension: Secondary | ICD-10-CM

## 2019-07-20 DIAGNOSIS — I480 Paroxysmal atrial fibrillation: Secondary | ICD-10-CM | POA: Diagnosis not present

## 2019-07-20 DIAGNOSIS — Z9861 Coronary angioplasty status: Secondary | ICD-10-CM

## 2019-07-20 DIAGNOSIS — I2583 Coronary atherosclerosis due to lipid rich plaque: Secondary | ICD-10-CM

## 2019-07-20 MED ORDER — SILDENAFIL CITRATE 50 MG PO TABS: 50.0000 mg | ORAL_TABLET | Freq: Every day | ORAL | 6 refills | Status: DC | PRN

## 2019-07-20 MED ORDER — FUROSEMIDE 40 MG PO TABS: 40.0000 mg | ORAL_TABLET | Freq: Every day | ORAL | 2 refills | Status: DC

## 2019-07-20 NOTE — Progress Notes (Signed)
Cardiology Office Note    Date:  07/20/2019   ID:  [name], DOB [birth year], MRN [id number]  PCP:  [name], MD  Cardiologist: Tobias Alexander, MD EPS: None  Reason for visit; 6 months follow up  History of Present Illness:  Ian [name] is a 48 y.o. male with history of CAD status post NSTEMI 2016 treated with DES to the RCA with residual 25% mid circumflex, LVEF 45 to 50%.  He also had atrial fibrillation with RVR at that time, CHA2DS2-VASc was 2 and he was on aspirin Plavix and Eliquis for 30 days and then aspirin was stopped.  Abnormal stress test 2018 for possible inferior ischemia and cardiac cath showed 80% in-stent restenosis of the RCA treated with balloon angioplasty.  There was also a 90% mid RCA unable to cross with the balloon so underwent staged orbital atherectomy and DES placement 09/05/2016.  EF 40 to 45% at that time.  Patient also has hypertension, DM type II, HLD, tobacco abuse and obesity.  03/2018 overnight admission at [facility] CTA negative for PE, Lexiscan no ischemia.  Holter monitor 04/2018 showed atrial fibrillation 80% a lot of the time  times RVR metoprolol increased to 50 mg twice daily and chest pain resolved.  Patient saw Nada Boozer, NP 10/12/2018 complaining of increased chest pain.   Cardiac cath showed widely patent stents with only mild restenosis, 50% mid circumflex nonsignificant by DFR, proximal LAD to mid LAD 25%, no aortic valve stenosis.  Medical therapy recommended.  07/20/2019 -this is 6 months follow-up, the patient has been feeling well, he denies any chest pain or shortness of breath, he has mild lower extremity edema occasionally.  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  He has been experiencing a lot of joint and muscle pain.  Past Medical History:  Diagnosis Date  . Arthritis    "left ankle" (09/04/2016)  . Cardiomyopathy (HCC)    a. EF 40-45%  . Chest pain 04/22/2017  . Complication  of anesthesia    "hard to get me out of it sometimes; usually takes me longer to get under"  . Coronary artery disease    a. multiple PCIs to RCA.  . Essential hypertension 12/19/2015  . Hyperlipidemia 05/21/2015  . Hypertension   . NSTEMI (non-ST elevated myocardial infarction) (HCC) 05/2015   [name] 05/23/2015 "minor one"  . PAF (paroxysmal atrial fibrillation) (HCC)   . Tobacco abuse 05/21/2015  . Type II diabetes mellitus (HCC)     Past Surgical History:  Procedure Laterality Date  . CARDIAC CATHETERIZATION  2000, 2005   "less than 20% blockage"  . CARDIAC CATHETERIZATION N/A 05/22/2015   Procedure: Left Heart Cath and Coronary Angiography;  Surgeon: [name], MD;  Location: [facility] INVASIVE CV LAB;  Service: Cardiovascular;  Laterality: N/A;  . CARDIAC CATHETERIZATION N/A 05/22/2015   Procedure: Coronary Stent Intervention;  Surgeon: [name], MD;  Location: [facility] INVASIVE CV LAB;  Service: Cardiovascular;  Laterality: N/A;  . CARDIAC CATHETERIZATION N/A 09/04/2016   Procedure: Left Heart Cath and Coronary Angiography;  Surgeon: [name], MD;  Location: [facility] INVASIVE CV LAB;  Service: Cardiovascular;  Laterality: N/A;  . CARDIAC CATHETERIZATION N/A 09/04/2016   Procedure: Coronary Balloon Angioplasty;  Surgeon: [name], MD;  Location: [facility] INVASIVE CV LAB;  Service: Cardiovascular;  Laterality: N/A;  . CARDIAC CATHETERIZATION N/A 09/05/2016   Procedure: Coronary Atherectomy;  Surgeon: Onalee Hua  Starr Lake, MD;  Location: [facility] INVASIVE CV LAB;  Service: Cardiovascular;  Laterality: N/A;  . CARDIAC CATHETERIZATION N/A 09/05/2016   Procedure: Coronary Stent Intervention;  Surgeon: Marykay Lex, MD;  Location: [facility] INVASIVE CV LAB;  Service: Cardiovascular;  Laterality: N/A;  . CARDIAC CATHETERIZATION N/A 09/05/2016   Procedure: Temporary Pacemaker;  Surgeon: Marykay Lex, MD;  Location: [facility] INVASIVE CV LAB;  Service: Cardiovascular;  Laterality: N/A;  . CORONARY  ANGIOPLASTY    . INTRAVASCULAR PRESSURE WIRE/FFR STUDY N/A 10/16/2018   Procedure: INTRAVASCULAR PRESSURE WIRE/FFR STUDY;  Surgeon: [name], MD;  Location: [facility] INVASIVE CV LAB;  Service: Cardiovascular;  Laterality: N/A;  . LEFT HEART CATH AND CORONARY ANGIOGRAPHY N/A 10/16/2018   Procedure: LEFT HEART CATH AND CORONARY ANGIOGRAPHY;  Surgeon: [name], MD;  Location: [facility] INVASIVE CV LAB;  Service: Cardiovascular;  Laterality: N/A;  . MULTIPLE TOOTH EXTRACTIONS  2013   "all of them"  . TYMPANOSTOMY TUBE PLACEMENT Bilateral 1970s    Current Medications: Current Meds  Medication Sig  . albuterol (VENTOLIN HFA) 108 (90 Base) MCG/ACT inhaler Inhale into the lungs as needed.  Marland Kitchen apixaban (ELIQUIS) 5 MG TABS tablet Take 1 tablet (5 mg total) by mouth 2 (two) times daily.  Marland Kitchen atorvastatin (LIPITOR) 80 MG tablet Take 1 tablet (80 mg total) by mouth daily at 6 PM.  . Cinnamon 500 MG capsule Take 500 mg by mouth 2 (two) times daily.   . clopidogrel (PLAVIX) 75 MG tablet TAKE 1 TABLET (  TOTAL) BY MOUTH DAILY  . dapagliflozin propanediol (FARXIGA) 5 MG TABS tablet Take 5 mg by mouth daily.  Marland Kitchen glipiZIDE (GLUCOTROL) 5 MG tablet Take 5 mg by mouth 2 (two) times daily before a meal.   . lisinopril (ZESTRIL) 2.5 MG tablet Take 1 tablet (2.5 mg total) by mouth daily.  . metFORMIN (GLUCOPHAGE) 500 MG tablet Take 1 tablet (500 mg total) by mouth 2 (two) times daily with a meal.  . metoprolol tartrate (LOPRESSOR) 100 MG tablet Take 100 mg by mouth 2 (two) times daily.  . Omega-3 Fatty Acids (FISH OIL) 1200 MG CAPS Take 1,200 mg by mouth daily.   . [DISCONTINUED] furosemide (LASIX) 20 MG tablet Take 1 tablet (20 mg total) by mouth daily.  . [DISCONTINUED] nitroGLYCERIN (NITROSTAT) 0.4 MG SL tablet Place 1 tablet (0.4 mg total) under the tongue every 5 (five) minutes x 3 doses as needed for chest pain.     Allergies:   Patient has no known allergies.   Social History   Socioeconomic  History  . Marital status: Married    Spouse name: Not on file  . Number of children: Not on file  . Years of education: Not on file  . Highest education level: Not on file  Occupational History  . Not on file  Social Needs  . Financial resource strain: Not on file  . Food insecurity    Worry: Not on file    Inability: Not on file  . Transportation needs    Medical: Not on file    Non-medical: Not on file  Tobacco Use  . Smoking status: Current Every Day Smoker    Packs/day: 2.00    Years: 35.00    Pack years: 70.00    Types: Cigarettes, E-cigarettes  . Smokeless tobacco: Never Used  Substance and Sexual Activity  . Alcohol use: Yes    Alcohol/week: 0.0 standard drinks    Comment: 09/04/2016  "might have a few drinks/year;  nothing since 05/2015"  . Drug use: Yes    Types: Cocaine, Marijuana    Comment: 09/04/2016 "haven't touched drugs since 1988"  . Sexual activity: Yes  Lifestyle  . Physical activity    Days per week: Not on file    Minutes per session: Not on file  . Stress: Not on file  Relationships  . Social Musicianconnections    Talks on phone: Not on file    Gets together: Not on file    Attends religious service: Not on file    Active member of club or organization: Not on file    Attends meetings of clubs or organizations: Not on file    Relationship status: Not on file  Other Topics Concern  . Not on file  Social History Narrative   Employed as a Naval architectTruck Driver. [place]     Family History:  The patient's family history includes Diabetes in his maternal grandmother, mother, and sister; Heart attack in his father.   ROS:   Please see the history of present illness.    Review of Systems  Constitution: Negative.  HENT: Negative.   Cardiovascular: Positive for chest pain and dyspnea on exertion.  Respiratory: Negative.   Endocrine: Negative.   Hematologic/Lymphatic: Negative.   Musculoskeletal: Negative.   Gastrointestinal: Negative.    Genitourinary: Negative.   Neurological: Negative.    All other systems reviewed and are negative.   PHYSICAL EXAM:   VS:  BP 126/84   Pulse (!) 52   Ht 5\' 11"  (1.803 m)   Wt 280 lb 3.2 oz (127.1 kg)   SpO2 98%   BMI 39.08 kg/m   Physical Exam  GEN: Obese, in no acute distress  Neck: no JVD, carotid bruits, or masses Cardiac:RRR; no murmurs, rubs, or gallops  Respiratory: Decreased breath sounds but clear to auscultation bilaterally, normal work of breathing GI: soft, nontender, nondistended, + BS Ext: without cyanosis, clubbing, or edema, Good distal pulses bilaterally Neuro:  Alert and Oriented x 3 Psych: euthymic mood, full affect  Wt Readings from Last 3 Encounters:  07/20/19 280 lb 3.2 oz (127.1 kg)  10/27/18 276 lb 1.9 oz (125.2 kg)  10/16/18 275 lb (124.7 kg)      Studies/Labs Reviewed:   EKG:  EKG is not ordered today.  Recent Labs: 10/12/2018: BUN 18; Creatinine, Ser 1.18; Hemoglobin 17.6; Platelets 154; Potassium 4.3; Sodium 141   Lipid Panel    Component Value Date/Time   CHOL 144 05/05/2017 0744   TRIG 291 (H) 05/05/2017 0744   HDL 29 (L) 05/05/2017 0744   CHOLHDL 5.0 05/05/2017 0744   CHOLHDL 7.9 05/22/2015 0233   VLDL UNABLE TO CALCULATE IF TRIGLYCERIDE OVER 400 mg/dL 16/10/960410/05/2015 54090233   LDLCALC 57 05/05/2017 0744    Additional studies/ records that were reviewed today include:  Cardiac cath 10/16/18  Previously placed Prox RCA to Mid RCA drug eluting stents are widely patent, with only mild restenosis.  Mid Cx lesion is 50% stenosed. Not significant by DFR.  Prox LAD to Mid LAD lesion is 25% stenosed.  LV end diastolic pressure is normal.  There is no aortic valve stenosis.   Continue aggressive medical therapy.    Echo 10/02/18 Summary:  1. The study quality is technically difficult, however, Definity used to enhance left ventricular visualization and quality. 2. The left ventricular chamber size is normal. 3. There is mildly decreased  left ventricular systolic function. Mild global hypokinesis of the left ventricle is  observed. The ejection fraction is estimated to be 40%. 4. There is mild mitral regurgitation observed.   Findings       Technical Comments: The study quality is technically difficult, however, Definity used to enhance left ventricular visualization and quality.  The study is technically limited due to patient body habitus.     Left Ventricle: The left ventricular chamber size is normal.  There is mildly decreased left ventricular systolic function.  Mild global hypokinesis of the left ventricle is observed.  The ejection fraction is estimated to be 40%.  Left ventricular diastolic function is unable to be assessed due to arrhythmia.    Left Atrium: The left atrial chamber size is normal.    Right Ventricle: The right ventricular cavity size is normal.  The right ventricular global systolic function is normal.    Right Atrium: The right atrium is normal.    Aortic Valve: The aortic valve is not well visualized.  There is no evidence of aortic regurgitation.    Mitral Valve: The mitral valve leaflets appear normal.  There is mild mitral regurgitation observed.    Tricuspid Valve: The tricuspid valve leaflets are morphologically normal.  There is trivial tricuspid regurgitation.    Pulmonic Valve: The pulmonic valve is not well visualized.  There is no evidence of pulmonic regurgitation.    Pericardium: The pericardium is not well visualized.    Aorta: There is no dilatation of the ascending aorta.    Pulmonary Artery: The main pulmonary artery is not well visualized.    Venous: The inferior vena cava is dilated with <50% inspiratory collapse, suggestive of an elevated right atrial pressure ( ).    Contrast: Definity was used to optimize study.    Measurements  Chambers Name                         Value         Normal Range      IVSd (2D)                    0.92 cm        (0.6 - 1.1)       LVIDd (2D)                   4.94 cm       -                  LVIDs (2D)                   4.16 cm       (2.2 - 4)         LVPWd (2D)                   0.94 cm       (0.6 - 1.1)       Ao root diameter (2D)        3.1 cm        -                  Ascending Ao                 3.4 cm        -                  LA dimension 2D  3.7 cm        -                  LA ESV SP 4CH (MOD)          43.1 ml       -                  LA ESV SP 2CH (MOD)          31 ml         -                  LA ESV BP (MOD)              37 ml         -                  LA ESV BP (MOD) index        15.4 ml/m2    (16 - 28)         BP EF (MOD)                  39.3 %        (55 - 80)         LV FS (Teichholz) (2D)       15.8 %        (20 - 80)          Mitral Valve Name                         Value         Normal Range      MV E-wave Vmax               1.02 m/sec    -                  LV E:e' lateral ratio        6.9 ratio     -                  LV E:e' septal ratio         9.5 ratio     -                  MV PHT                       67 msec       -                  MVA (PHT)                    3.28 cm2      (4 - 6)          CT at [facility] 04/2018 IMPRESSION Impression:  No evidence for pulmonary emboli.  Mildly prominent right hilar lymph node and small right lung nodule nonspecific. Follow-up could be obtained to assure stability.  ASSESSMENT:    1. Essential hypertension   2. Coronary artery disease due to lipid rich plaque   3. PAF (paroxysmal atrial fibrillation) ([place])   4. Type 2 diabetes mellitus with complication, without long-term current use of insulin ([place])   5. Mixed hyperlipidemia   6. S/P PTCA (percutaneous transluminal coronary angioplasty)      PLAN:  In order of problems listed above:  CAD with history of non-STEMI 2016 treated with DES to the RCA, balloon angioplasty to the RCA for 80% in-stent restenosis 2018 also staged orbital atherectomy and DES to the  mid RCA 09/05/2016 LVEF 40 to 45%.  repeat cardiac catheterization 10/16/2018 with widely patent stents in the proximal RCA to mid RCA and only mild restenosis in the mid circumflex 50% not significant by DFR.  Proximal LAD to mid LAD 25%.  Medical therapy recommended.  His EKG is unchanged from prior. 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  Paroxysmal atrial fibrillation  -Currently in sinus rhythm.  He is on Eliquis, and has no bleeding.  Ischemic cardiomyopathy ejection fraction 40% no heart failure on exam.  Hypertension blood pressure well controlled  Hyperlipidemia on Lipitor, he is experiencing significant myalgias he is advised to stop for a week and see if that improves his symptoms.  Tobacco abuse continues to smoke 1 to 1-1/2 packs of cigarettes daily.  Long discussion about the importance of smoking cessation especially given the fact that he has diabetes, coronary artery disease and pulmonary nodules on prior CT.  He says he is going to try to quit.    Lung nodules and emphysema on prior chest x-ray and CT done at [facility] and is in care everywhere.  Referring to pulmonary.  Erectile dysfunction, will start sildenafil as needed, and discontinue nitroglycerin he is advised about that.  Lower extremity edema-probably combination of sedentary lifestyle and salt intake, will increase Lasix to 40 mg p.o. daily,  he is advised to use compression socks and avoid salt. We will obtain labs including lipids, hemoglobin A1c, CBC, CMP, TSH today. Medication Adjustments/Labs and Tests Ordered: Current medicines are reviewed at length with the patient today.  Concerns regarding medicines are outlined above.  Medication changes, Labs and Tests ordered today are listed in the Patient Instructions below. Patient Instructions  Medication Instructions:   STOP TAKING NITROGLYCERIN  DR. [name] HAS PRESCRIBED YOU SILDENAFIL (VIAGRA) 50 MG BY MOUTH  DAILY AS NEEDED FOR ERECTILE DYSFUNCTION.  YOU CANNOT TAKE THIS MEDICATION WITH NITROGLYCERIN.   INCREASE YOUR LASIX TO 40 MG BY MOUTH DAILY  *If you need a refill on your cardiac medications before your next appointment, please call your pharmacy*    Lab Work:  TODAY--CMET, CBC W DIFF, TSH, HEMOGLOBIN A1C, AND LIPIDS  If you have labs (blood work) drawn today and your tests are completely normal, you will receive your results only by: Marland Kitchen MyChart Message (if you have MyChart) OR . A paper copy in the mail If you have any lab test that is abnormal or we need to change your treatment, we will call you to review the results.    Follow-Up: At [facility], you and your health needs are our priority.  As part of our continuing mission to provide you with exceptional heart care, we have created designated Provider Care Teams.  These Care Teams include your primary Cardiologist (physician) and Advanced Practice Providers (APPs -  Physician Assistants and Nurse Practitioners) who all work together to provide you with the care you need, when you need it.  Your next appointment:   6 month(s)  The format for your next appointment:   In Person  Provider:   Tobias Alexander, MD   *HOLD YOUR ATORVASTATIN FOR ONE WEEK ONLY THEN CALL OUR OFFICE AT [phone] OR [facility] [place] HOW YOUR SYMPTOMS ARE SINCE STOPPING*    Signed, Tobias Alexander,  MD  07/20/2019 10:21 AM    [facility] [address] [place], [place], [place]  [address] Phone: [phone]; Fax: [phone]

## 2019-07-20 NOTE — Patient Instructions (Addendum)
Medication Instructions:   STOP TAKING NITROGLYCERIN  DR. [name] HAS PRESCRIBED YOU SILDENAFIL (VIAGRA) 50 MG BY MOUTH DAILY AS NEEDED FOR ERECTILE DYSFUNCTION.  YOU CANNOT TAKE THIS MEDICATION WITH NITROGLYCERIN.   INCREASE YOUR LASIX TO 40 MG BY MOUTH DAILY  *If you need a refill on your cardiac medications before your next appointment, please call your pharmacy*    Lab Work:  TODAY--CMET, CBC W DIFF, TSH, HEMOGLOBIN A1C, AND LIPIDS  If you have labs (blood work) drawn today and your tests are completely normal, you will receive your results only by: Marland Kitchen MyChart Message (if you have MyChart) OR . A paper copy in the mail If you have any lab test that is abnormal or we need to change your treatment, we will call you to review the results.    Follow-Up: At [facility], you and your health needs are our priority.  As part of our continuing mission to provide you with exceptional heart care, we have created designated Provider Care Teams.  These Care Teams include your primary Cardiologist (physician) and Advanced Practice Providers (APPs -  Physician Assistants and Nurse Practitioners) who all work together to provide you with the care you need, when you need it.  Your next appointment:   6 month(s)  The format for your next appointment:   In Person  Provider:   Ena Dawley, MD   *HOLD YOUR ATORVASTATIN FOR ONE WEEK ONLY THEN CALL OUR OFFICE AT [phone] OR MYCHART [place] HOW YOUR SYMPTOMS ARE SINCE STOPPING*

## 2019-07-21 LAB — CBC WITH DIFFERENTIAL/PLATELET
Basophils Absolute: 0.1 10*3/uL (ref 0.0–0.2)
Basos: 1 %
EOS (ABSOLUTE): 0.2 10*3/uL (ref 0.0–0.4)
Eos: 2 %
Hematocrit: 53.7 % — ABNORMAL HIGH (ref 37.5–51.0)
Hemoglobin: 19.2 g/dL — ABNORMAL HIGH (ref 13.0–17.7)
Immature Grans (Abs): 0 10*3/uL (ref 0.0–0.1)
Immature Granulocytes: 0 %
Lymphocytes Absolute: 2.4 10*3/uL (ref 0.7–3.1)
Lymphs: 27 %
MCH: 33.9 pg — ABNORMAL HIGH (ref 26.6–33.0)
MCHC: 35.8 g/dL — ABNORMAL HIGH (ref 31.5–35.7)
MCV: 95 fL (ref 79–97)
Monocytes Absolute: 0.8 10*3/uL (ref 0.1–0.9)
Monocytes: 9 %
Neutrophils Absolute: 5.3 10*3/uL (ref 1.4–7.0)
Neutrophils: 61 %
Platelets: 166 10*3/uL (ref 150–450)
RBC: 5.67 x10E6/uL (ref 4.14–5.80)
RDW: 12.5 % (ref 11.6–15.4)
WBC: 8.8 10*3/uL (ref 3.4–10.8)

## 2019-07-21 LAB — TSH: TSH: 1.13 u[IU]/mL (ref 0.450–4.500)

## 2019-07-21 LAB — COMPREHENSIVE METABOLIC PANEL
ALT: 22 IU/L (ref 0–44)
AST: 14 IU/L (ref 0–40)
Albumin/Globulin Ratio: 2 (ref 1.2–2.2)
Albumin: 4.3 g/dL (ref 4.0–5.0)
Alkaline Phosphatase: 107 IU/L (ref 39–117)
BUN/Creatinine Ratio: 12 (ref 9–20)
BUN: 12 mg/dL (ref 6–24)
Bilirubin Total: 0.6 mg/dL (ref 0.0–1.2)
CO2: 25 mmol/L (ref 20–29)
Calcium: 9.4 mg/dL (ref 8.7–10.2)
Chloride: 101 mmol/L (ref 96–106)
Creatinine, Ser: 0.97 mg/dL (ref 0.76–1.27)
GFR calc Af Amer: 105 mL/min/{1.73_m2} (ref >59)
GFR calc non Af Amer: 91 mL/min/{1.73_m2} (ref >59)
Globulin, Total: 2.2 g/dL (ref 1.5–4.5)
Glucose: 130 mg/dL — ABNORMAL HIGH (ref 65–99)
Potassium: 4.2 mmol/L (ref 3.5–5.2)
Sodium: 141 mmol/L (ref 134–144)
Total Protein: 6.5 g/dL (ref 6.0–8.5)

## 2019-07-21 LAB — HEMOGLOBIN A1C
Est. average glucose Bld gHb Est-mCnc: 148 mg/dL
Hgb A1c MFr Bld: 6.8 % — ABNORMAL HIGH (ref 4.8–5.6)

## 2019-07-21 LAB — LIPID PANEL
Chol/HDL Ratio: 3.5 ratio (ref 0.0–5.0)
Cholesterol, Total: 95 mg/dL — ABNORMAL LOW (ref 100–199)
HDL: 27 mg/dL — ABNORMAL LOW (ref >39)
LDL Chol Calc (NIH): 40 mg/dL (ref 0–99)
Triglycerides: 169 mg/dL — ABNORMAL HIGH (ref 0–149)
VLDL Cholesterol Cal: 28 mg/dL (ref 5–40)

## 2019-08-04 ENCOUNTER — Telehealth: Payer: Self-pay | Admitting: Cardiology

## 2019-08-04 NOTE — Telephone Encounter (Signed)
New message:     Patient calling concerning his last stress test and would like for them results to be sent to his e-mail [email or web address]. please call patient back.

## 2019-08-04 NOTE — Telephone Encounter (Signed)
Returned call to pt.  Pt wanted a copy of his recent ECHO.  After some digging, discovered he had had his ECHO at [facility].  Advised he would need to call [facility] to get that documentation.  Pt thanked nurse for assistance.

## 2019-08-23 ENCOUNTER — Other Ambulatory Visit: Payer: Self-pay

## 2019-08-23 ENCOUNTER — Telehealth: Payer: Self-pay | Admitting: Cardiology

## 2019-08-23 MED ORDER — METOPROLOL TARTRATE 100 MG PO TABS: 100.0000 mg | ORAL_TABLET | Freq: Two times a day (BID) | ORAL | 2 refills | Status: DC

## 2019-08-23 MED ORDER — LISINOPRIL 2.5 MG PO TABS: 2.5000 mg | ORAL_TABLET | Freq: Every day | ORAL | 3 refills | Status: DC

## 2019-08-23 MED ORDER — CLOPIDOGREL BISULFATE 75 MG PO TABS: ORAL_TABLET | ORAL | 3 refills | Status: DC

## 2019-08-23 NOTE — Telephone Encounter (Signed)
Pt's medications were sent to pt's pharmacy as requested. Confirmation received.  

## 2019-08-23 NOTE — Telephone Encounter (Signed)
 *  STAT* If patient is at the pharmacy, call can be transferred to refill team.   1. Which medications need to be refilled? (please list name of each medication and dose if known)   clopidogrel (PLAVIX) 75 MG tablet lisinopril (ZESTRIL) 2.5 MG tablet  2. Which pharmacy/location (including street and city if local pharmacy) is medication to be sent to? [facility] [address], [place] - [address]  3. Do they need a 30 day or 90 day supply? 90 day

## 2019-09-24 ENCOUNTER — Encounter: Payer: Self-pay | Admitting: *Deleted

## 2019-09-24 ENCOUNTER — Other Ambulatory Visit: Payer: Self-pay | Admitting: Cardiology

## 2019-09-24 MED ORDER — APIXABAN 5 MG PO TABS: 5.0000 mg | ORAL_TABLET | Freq: Two times a day (BID) | ORAL | 1 refills | Status: DC

## 2019-09-24 NOTE — Telephone Encounter (Signed)
New Message      *STAT* If patient is at the pharmacy, call can be transferred to refill team.   1. Which medications need to be refilled? (please list name of each medication and dose if known) apixaban (ELIQUIS) 5 MG TABS tablet  2. Which pharmacy/location (including street and city if local pharmacy) is medication to be sent to? [facility] [address], [place] - [address]  3. Do they need a 30 day or 90 day supply? 90 day

## 2019-09-24 NOTE — Telephone Encounter (Signed)
Pt last saw Dr [name] 07/20/19, last labs 07/20/19 Creat 0.97, age 49, weight 127.1kg, based on specified criteria pt is on appropriate dosage of Eliquis 5mg  BID.  Will refill rx.

## 2019-09-24 NOTE — Telephone Encounter (Signed)
This encounter was created in error - please disregard.

## 2020-02-08 ENCOUNTER — Other Ambulatory Visit: Payer: Self-pay

## 2020-02-08 ENCOUNTER — Encounter: Payer: Self-pay | Admitting: Cardiology

## 2020-02-08 ENCOUNTER — Ambulatory Visit: Payer: 59 | Admitting: Cardiology

## 2020-02-08 ENCOUNTER — Encounter: Payer: Self-pay | Admitting: *Deleted

## 2020-02-08 VITALS — BP 124/76 | HR 76 | Ht 71.0 in | Wt 272.8 lb

## 2020-02-08 DIAGNOSIS — I480 Paroxysmal atrial fibrillation: Secondary | ICD-10-CM

## 2020-02-08 DIAGNOSIS — I10 Essential (primary) hypertension: Secondary | ICD-10-CM | POA: Diagnosis not present

## 2020-02-08 DIAGNOSIS — I2510 Atherosclerotic heart disease of native coronary artery without angina pectoris: Secondary | ICD-10-CM

## 2020-02-08 DIAGNOSIS — Z720 Tobacco use: Secondary | ICD-10-CM

## 2020-02-08 DIAGNOSIS — E782 Mixed hyperlipidemia: Secondary | ICD-10-CM

## 2020-02-08 DIAGNOSIS — I739 Peripheral vascular disease, unspecified: Secondary | ICD-10-CM

## 2020-02-08 DIAGNOSIS — G4733 Obstructive sleep apnea (adult) (pediatric): Secondary | ICD-10-CM

## 2020-02-08 DIAGNOSIS — R0609 Other forms of dyspnea: Secondary | ICD-10-CM

## 2020-02-08 DIAGNOSIS — Z9861 Coronary angioplasty status: Secondary | ICD-10-CM | POA: Diagnosis not present

## 2020-02-08 DIAGNOSIS — R0600 Dyspnea, unspecified: Secondary | ICD-10-CM

## 2020-02-08 MED ORDER — FUROSEMIDE 40 MG PO TABS
40.0000 mg | ORAL_TABLET | Freq: Two times a day (BID) | ORAL | 1 refills | Status: DC
Start: 2020-02-08 — End: 2020-07-31

## 2020-02-08 NOTE — Progress Notes (Signed)
Cardiology Office Note    Date:  02/08/2020   ID:  Ian [name], DOB [birth year], MRN [id number]  PCP:  Patient, No Pcp Per  Cardiologist: Tobias Alexander, MD EPS: None  Reason for visit: Dyspnea on exertion, fatigue, lower extremity edema.  History of Present Illness:  Ian [name] is a 49 y.o. male with history of CAD status post NSTEMI 2016 treated with DES to the RCA with residual 25% mid circumflex, LVEF 45 to 50%.  He also had atrial fibrillation with RVR at that time, CHA2DS2-VASc was 2 and he was on aspirin Plavix and Eliquis for 30 days and then aspirin was stopped.  Abnormal stress test 2018 for possible inferior ischemia and cardiac cath showed 80% in-stent restenosis of the RCA treated with balloon angioplasty.  There was also a 90% mid RCA unable to cross with the balloon so underwent staged orbital atherectomy and DES placement 09/05/2016.  EF 40 to 45% at that time.  Patient also has hypertension, DM type II, HLD, tobacco abuse and obesity.  03/2018 overnight admission at [facility] CTA negative for PE, Lexiscan no ischemia.  Holter monitor 04/2018 showed atrial fibrillation 80% a lot of the time  times RVR metoprolol increased to 50 mg twice daily and chest pain resolved.  Patient saw Nada Boozer, NP 10/12/2018 complaining of increased chest pain.   Cardiac cath showed widely patent stents with only mild restenosis, 50% mid circumflex nonsignificant by DFR, proximal LAD to mid LAD 25%, no aortic valve stenosis.  Medical therapy recommended.  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  He has noticed worsening lower extremity edema but no orthopnea or proximal nocturnal dyspnea.  He has been started on CPAP machine and noticed worsening fatigue since then.  He denies any palpitations no chest pain.   He has been compliant with his medications.  He has also noticed that his heart rate has been elevated between 70-140/minute.  He has also been experiencing significant claudications walking short distances..  Past Medical History:  Diagnosis Date  . Arthritis    "left ankle" (09/04/2016)  . Cardiomyopathy (HCC)    a. EF 40-45%  . Chest pain 04/22/2017  . Complication of anesthesia    "hard to get me out of it sometimes; usually takes me longer to get under"  . Coronary artery disease    a. multiple PCIs to RCA.  . Essential hypertension 12/19/2015  . Hyperlipidemia 05/21/2015  . Hypertension   . NSTEMI (non-ST elevated myocardial infarction) (HCC) 05/2015   [name] 05/23/2015 "minor one"  . PAF (paroxysmal atrial fibrillation) (HCC)   . Tobacco abuse 05/21/2015  . Type II diabetes mellitus (HCC)     Past Surgical History:  Procedure Laterality Date  . CARDIAC CATHETERIZATION  2000, 2005   "less than 20% blockage"  . CARDIAC CATHETERIZATION N/A 05/22/2015   Procedure: Left Heart Cath and Coronary Angiography;  Surgeon: [name], MD;  Location: [facility] INVASIVE CV LAB;  Service: Cardiovascular;  Laterality: N/A;  . CARDIAC CATHETERIZATION N/A 05/22/2015   Procedure: Coronary Stent Intervention;  Surgeon: [name], MD;  Location: [facility] INVASIVE CV LAB;  Service: Cardiovascular;  Laterality: N/A;  . CARDIAC CATHETERIZATION N/A 09/04/2016   Procedure: Left Heart Cath and Coronary Angiography;  Surgeon: [name], MD;  Location: [facility] INVASIVE CV LAB;  Service: Cardiovascular;  Laterality: N/A;  . CARDIAC CATHETERIZATION N/A 09/04/2016   Procedure: Coronary Balloon Angioplasty;  Surgeon: [name], MD;  Location: [facility] INVASIVE CV LAB;  Service: Cardiovascular;  Laterality: N/A;  . CARDIAC CATHETERIZATION N/A 09/05/2016   Procedure: Coronary Atherectomy;  Surgeon: Marykay Lex, MD;  Location: [facility] INVASIVE CV LAB;  Service: Cardiovascular;  Laterality: N/A;  .  CARDIAC CATHETERIZATION N/A 09/05/2016   Procedure: Coronary Stent Intervention;  Surgeon: Marykay Lex, MD;  Location: [facility] INVASIVE CV LAB;  Service: Cardiovascular;  Laterality: N/A;  . CARDIAC CATHETERIZATION N/A 09/05/2016   Procedure: Temporary Pacemaker;  Surgeon: Marykay Lex, MD;  Location: [facility] INVASIVE CV LAB;  Service: Cardiovascular;  Laterality: N/A;  . CORONARY ANGIOPLASTY    . INTRAVASCULAR PRESSURE WIRE/FFR STUDY N/A 10/16/2018   Procedure: INTRAVASCULAR PRESSURE WIRE/FFR STUDY;  Surgeon: [name], MD;  Location: [facility] INVASIVE CV LAB;  Service: Cardiovascular;  Laterality: N/A;  . LEFT HEART CATH AND CORONARY ANGIOGRAPHY N/A 10/16/2018   Procedure: LEFT HEART CATH AND CORONARY ANGIOGRAPHY;  Surgeon: [name], MD;  Location: [facility] INVASIVE CV LAB;  Service: Cardiovascular;  Laterality: N/A;  . MULTIPLE TOOTH EXTRACTIONS  2013   "all of them"  . TYMPANOSTOMY TUBE PLACEMENT Bilateral 1970s    Current Medications: Current Meds  Medication Sig  . albuterol (VENTOLIN HFA) 108 (90 Base) MCG/ACT inhaler Inhale into the lungs as needed.  Marland Kitchen apixaban (ELIQUIS) 5 MG TABS tablet Take 1 tablet (5 mg total) by mouth 2 (two) times daily.  Marland Kitchen atorvastatin (LIPITOR) 80 MG tablet Take 1 tablet (80 mg total) by mouth daily at 6 PM.  . Cinnamon 500 MG capsule Take 500 mg by mouth 2 (two) times daily.   . clopidogrel (PLAVIX) 75 MG tablet TAKE 1 TABLET (  TOTAL) BY MOUTH DAILY  . dapagliflozin propanediol (FARXIGA) 5 MG TABS tablet Take 5 mg by mouth daily.  Marland Kitchen glipiZIDE (GLUCOTROL) 5 MG tablet Take 5 mg by mouth 2 (two) times daily before a meal.   . lisinopril (ZESTRIL) 2.5 MG tablet Take 1 tablet (2.5 mg total) by mouth daily.  . metFORMIN (GLUCOPHAGE) 500 MG tablet Take 1 tablet (500 mg total) by mouth 2 (two) times daily with a meal.  . metoprolol tartrate (LOPRESSOR) 100 MG tablet Take 1 tablet (100 mg total) by mouth 2 (two) times daily.  . Omega-3 Fatty Acids (FISH OIL)  1200 MG CAPS Take 1,200 mg by mouth daily.   . sildenafil (VIAGRA) 50 MG tablet Take 1 tablet (50 mg total) by mouth daily as needed for erectile dysfunction.  . [DISCONTINUED] furosemide (LASIX) 40 MG tablet Take 1 tablet (40 mg total) by mouth daily.     Allergies:   Patient has no known allergies.   Social History   Socioeconomic History  . Marital status: Married    Spouse name: Not on file  . Number of children: Not on file  . Years of education: Not on file  . Highest education level: Not on file  Occupational History  . Not on file  Tobacco Use  . Smoking status: Current Every Day Smoker    Packs/day: 2.00    Years: 35.00    Pack years: 70.00    Types: Cigarettes, E-cigarettes  . Smokeless tobacco: Never Used  Vaping Use  . Vaping Use: Never used  Substance and Sexual Activity  . Alcohol use: Yes    Alcohol/week: 0.0 standard drinks  Comment: 09/04/2016  "might have a few drinks/year; nothing since 05/2015"  . Drug use: Yes    Types: Cocaine, Marijuana    Comment: 09/04/2016 "haven't touched drugs since 1988"  . Sexual activity: Yes  Other Topics Concern  . Not on file  Social History Narrative   Employed as a Naval architectTruck Driver. [place]   Social Determinants of Health   Financial Resource Strain:   . Difficulty of Paying Living Expenses:   Food Insecurity:   . Worried About Programme researcher, broadcasting/film/videounning Out of Food in the Last Year:   . Baristaan Out of Food in the Last Year:   Transportation Needs:   . Freight forwarderLack of Transportation (Medical):   Marland Kitchen. Lack of Transportation (Non-Medical):   Physical Activity:   . Days of Exercise per Week:   . Minutes of Exercise per Session:   Stress:   . Feeling of Stress :   Social Connections:   . Frequency of Communication with Friends and Family:   . Frequency of Social Gatherings with Friends and Family:   . Attends Religious Services:   . Active Member of Clubs or Organizations:   . Attends BankerClub or Organization Meetings:   Marland Kitchen.  Marital Status:      Family History:  The patient's family history includes Diabetes in his maternal grandmother, mother, and sister; Heart attack in his father.   ROS:   Please see the history of present illness.    Review of Systems  Constitutional: Negative.  HENT: Negative.   Cardiovascular: Positive for chest pain and dyspnea on exertion.  Respiratory: Negative.   Endocrine: Negative.   Hematologic/Lymphatic: Negative.   Musculoskeletal: Negative.   Gastrointestinal: Negative.   Genitourinary: Negative.   Neurological: Negative.    All other systems reviewed and are negative.   PHYSICAL EXAM:   VS:  BP 124/76   Pulse 76   Ht 5\' 11"  (1.803 m)   Wt 272 lb 12.8 oz (123.7 kg)   SpO2 96%   BMI 38.05 kg/m   Physical Exam  GEN: Obese, in no acute distress  Neck: no JVD, carotid bruits, or masses Cardiac:RRR; no murmurs, rubs, or gallops  Respiratory: Decreased breath sounds but clear to auscultation bilaterally, normal work of breathing GI: soft, nontender, nondistended, + BS Ext: without cyanosis, clubbing, or edema, Good distal pulses bilaterally Neuro:  Alert and Oriented x 3 Psych: euthymic mood, full affect  Wt Readings from Last 3 Encounters:  02/08/20 272 lb 12.8 oz (123.7 kg)  07/20/19 280 lb 3.2 oz (127.1 kg)  10/27/18 276 lb 1.9 oz (125.2 kg)    He is EKG today shows atrial fibrillation, previously sinus bradycardia.  This was personally reviewed.  Studies/Labs Reviewed:    Recent Labs: 07/20/2019: ALT 22; BUN 12; Creatinine, Ser 0.97; Hemoglobin 19.2; Platelets 166; Potassium 4.2; Sodium 141; TSH 1.130   Lipid Panel    Component Value Date/Time   CHOL 95 (L) 07/20/2019 1026   TRIG 169 (H) 07/20/2019 1026   HDL 27 (L) 07/20/2019 1026   CHOLHDL 3.5 07/20/2019 1026   CHOLHDL 7.9 05/22/2015 0233   VLDL UNABLE TO CALCULATE IF TRIGLYCERIDE OVER 400 mg/dL 40/98/119110/05/2015 47820233   LDLCALC 40 07/20/2019 1026    Additional studies/ records that were reviewed  today include:  Cardiac cath 10/16/18  Previously placed Prox RCA to Mid RCA drug eluting stents are widely patent, with only mild restenosis.  Mid Cx lesion is 50% stenosed. Not significant by DFR.  Prox LAD  to Mid LAD lesion is 25% stenosed.  LV end diastolic pressure is normal.  There is no aortic valve stenosis.   Continue aggressive medical therapy.    Echo 10/02/18 Summary:  1. The study quality is technically difficult, however, Definity used to enhance left ventricular visualization and quality. 2. The left ventricular chamber size is normal. 3. There is mildly decreased left ventricular systolic function. Mild global hypokinesis of the left ventricle is observed. The ejection fraction is estimated to be 40%. 4. There is mild mitral regurgitation observed.   Findings       Technical Comments: The study quality is technically difficult, however, Definity used to enhance left ventricular visualization and quality.  The study is technically limited due to patient body habitus.     Left Ventricle: The left ventricular chamber size is normal.  There is mildly decreased left ventricular systolic function.  Mild global hypokinesis of the left ventricle is observed.  The ejection fraction is estimated to be 40%.  Left ventricular diastolic function is unable to be assessed due to arrhythmia.    Left Atrium: The left atrial chamber size is normal.    Right Ventricle: The right ventricular cavity size is normal.  The right ventricular global systolic function is normal.    Right Atrium: The right atrium is normal.    Aortic Valve: The aortic valve is not well visualized.  There is no evidence of aortic regurgitation.    Mitral Valve: The mitral valve leaflets appear normal.  There is mild mitral regurgitation observed.    Tricuspid Valve: The tricuspid valve leaflets are morphologically normal.  There is trivial tricuspid regurgitation.    Pulmonic Valve: The  pulmonic valve is not well visualized.  There is no evidence of pulmonic regurgitation.    Pericardium: The pericardium is not well visualized.    Aorta: There is no dilatation of the ascending aorta.    Pulmonary Artery: The main pulmonary artery is not well visualized.    Venous: The inferior vena cava is dilated with <50% inspiratory collapse, suggestive of an elevated right atrial pressure ( ).    Contrast: Definity was used to optimize study.    Measurements  Chambers Name                         Value         Normal Range      IVSd (2D)                    0.92 cm       (0.6 - 1.1)       LVIDd (2D)                   4.94 cm       -                  LVIDs (2D)                   4.16 cm       (2.2 - 4)         LVPWd (2D)                   0.94 cm       (0.6 - 1.1)       Ao root diameter (2D)        3.1 cm        -  Ascending Ao                 3.4 cm        -                  LA dimension 2D              3.7 cm        -                  LA ESV SP 4CH (MOD)          43.1 ml       -                  LA ESV SP 2CH (MOD)          31 ml         -                  LA ESV BP (MOD)              37 ml         -                  LA ESV BP (MOD) index        15.4 ml/m2    (16 - 28)         BP EF (MOD)                  39.3 %        (55 - 80)         LV FS (Teichholz) (2D)       15.8 %        (20 - 80)          Mitral Valve Name                         Value         Normal Range      MV E-wave Vmax               1.02 m/sec    -                  LV E:e' lateral ratio        6.9 ratio     -                  LV E:e' septal ratio         9.5 ratio     -                  MV PHT                       67 msec       -                  MVA (PHT)                    3.28 cm2      (4 - 6)          CT at [facility] 04/2018 IMPRESSION Impression:  No evidence for pulmonary emboli.  Mildly prominent right hilar lymph node and small right lung nodule nonspecific. Follow-up  could be obtained to assure stability.  ASSESSMENT:    1. Coronary artery disease  involving native coronary artery of native heart without angina pectoris   2. Essential hypertension   3. PAF (paroxysmal atrial fibrillation) (HCC)   4. S/P PTCA (percutaneous transluminal coronary angioplasty)   5. Tobacco abuse   6. Mixed hyperlipidemia   7. Dyspnea on exertion   8. OSA (obstructive sleep apnea)   9. Claudication [facility])    PLAN:  In order of problems listed above:  CAD with history of non-STEMI 2016 treated with DES to the RCA, balloon angioplasty to the RCA for 80% in-stent restenosis 2018 also staged orbital atherectomy and DES to the mid RCA 09/05/2016 LVEF 40 to 45%.  repeat cardiac catheterization 10/16/2018 with widely patent stents in the proximal RCA to mid RCA and only mild restenosis in the mid circumflex 50% not significant by DFR.  Proximal LAD to mid LAD 25%.  Medical therapy recommended.   He now has more significant symptoms, he continues to smoke and is now in atrial fibrillation that tends to have heart rate about 100, however we need to rule out ischemia and we will schedule him for a Lexiscan nuclear stress test.  Acute on chronic diastolic CHF, history of ischemic cardiomyopathy with ejection fraction of 40% -We will obtain repeat echocardiogram -Increase Lasix to 40 mg p.o. 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  Paroxysmal atrial fibrillation  -Currently in atrial fibrillation, if he has normal stress test and does not improve with diuresis, we will plan for cardioversion.  He is on Eliquis compliant and has no bleeding.  Claudications -Obtain bilateral lower extremity arterial ultrasound  Hypertension blood pressure well controlled  Hyperlipidemia on Lipitor, will continue.  Tobacco abuse continues to smoke 1 to 1-1/2 packs of cigarettes daily.  Long discussion about the importance of  smoking cessation especially given the fact that he has diabetes, coronary artery disease and pulmonary nodules on prior CT.  He says he is going to try to quit.    Lung nodules and emphysema on prior chest x-ray and CT done at [facility] and is in care everywhere.  Referring to pulmonary.   Medication Adjustments/Labs and Tests Ordered: Current medicines are reviewed at length with the patient today.  Concerns regarding medicines are outlined above.  Medication changes, Labs and Tests ordered today are listed in the Patient Instructions below. Patient Instructions  Medication Instructions:   INCREASE YOUR LASIX TO 40 MG BY MOUTH TWICE DAILY  *If you need a refill on your cardiac medications before your next appointment, please call your pharmacy*  Testing/Procedures:  Your physician has requested that you have an echocardiogram. Echocardiography is a painless test that uses sound waves to create images of your heart. It provides your doctor with information about the size and shape of your heart and how well your heart's chambers and valves are working. This procedure takes approximately one hour. There are no restrictions for this procedure.  Your physician has requested that you have a lower  extremity arterial duplex. This test is an ultrasound of the arteries in the legs or arms. It looks at arterial blood flow in the legs and arms. Allow one hour for Lower and Upper Arterial scans. There are no restrictions or special instructions  Your physician has requested that you have a lexiscan myoview. For further information please visit [email or web address] Please follow instruction sheet, as given.  PLEASE DO ON D-SPECT PER DR. [name]   Follow-Up:  2-3 WEEKS  WITH AN EXTENDER IN THE OFFICE PER DR. [name]      Signed, Tobias Alexander, MD  02/08/2020 12:39 PM    [facility] [address] [place], [place], [place]  [address] Phone: [phone]; Fax: [phone]

## 2020-02-08 NOTE — Patient Instructions (Signed)
Medication Instructions:   INCREASE YOUR LASIX TO 40 MG BY MOUTH TWICE DAILY  *If you need a refill on your cardiac medications before your next appointment, please call your pharmacy*  Testing/Procedures:  Your physician has requested that you have an echocardiogram. Echocardiography is a painless test that uses sound waves to create images of your heart. It provides your doctor with information about the size and shape of your heart and how well your heart's chambers and valves are working. This procedure takes approximately one hour. There are no restrictions for this procedure.  Your physician has requested that you have a lower  extremity arterial duplex. This test is an ultrasound of the arteries in the legs or arms. It looks at arterial blood flow in the legs and arms. Allow one hour for Lower and Upper Arterial scans. There are no restrictions or special instructions  Your physician has requested that you have a lexiscan myoview. For further information please visit [email or web address] Please follow instruction sheet, as given.  PLEASE DO ON D-SPECT PER DR. [name]   Follow-Up:  2-3 WEEKS WITH AN EXTENDER IN THE OFFICE PER DR. [name]

## 2020-02-29 ENCOUNTER — Other Ambulatory Visit: Payer: Self-pay | Admitting: Cardiology

## 2020-02-29 DIAGNOSIS — I739 Peripheral vascular disease, unspecified: Secondary | ICD-10-CM

## 2020-03-02 ENCOUNTER — Telehealth (HOSPITAL_COMMUNITY): Payer: Self-pay | Admitting: *Deleted

## 2020-03-02 ENCOUNTER — Encounter (HOSPITAL_COMMUNITY): Payer: Self-pay | Admitting: *Deleted

## 2020-03-02 NOTE — Telephone Encounter (Signed)
Left message on voicemail in reference to upcoming appointment scheduled for 03/06/20. Phone number given for a call back so details instructions can be given. My chart letter with instructions sent.  Ricky Ala

## 2020-03-03 ENCOUNTER — Telehealth: Payer: Self-pay | Admitting: Cardiology

## 2020-03-03 MED ORDER — METOPROLOL TARTRATE 100 MG PO TABS: 100.0000 mg | ORAL_TABLET | Freq: Two times a day (BID) | ORAL | 3 refills | Status: DC

## 2020-03-03 NOTE — Telephone Encounter (Signed)
Pt's medication was sent to pt's pharmacy as requested. Confirmation received.  °

## 2020-03-03 NOTE — Telephone Encounter (Signed)
*  STAT* If patient is at the pharmacy, call can be transferred to refill team.   1. Which medications need to be refilled? (please list name of each medication and dose if known)   metoprolol tartrate (LOPRESSOR) 100 MG tablet     2. Which pharmacy/location (including street and city if local pharmacy) is medication to be sent to?[facility] - [place], [place] - [address]  3. Do they need a 30 day or 90 day supply? 90 day supply

## 2020-03-06 ENCOUNTER — Ambulatory Visit (HOSPITAL_COMMUNITY)
Admission: RE | Admit: 2020-03-06 | Discharge: 2020-03-06 | Disposition: A | Discharge status: Home / Self Care | Payer: 59 | Source: Ambulatory Visit | Attending: Cardiology | Admitting: Cardiology

## 2020-03-06 ENCOUNTER — Ambulatory Visit (HOSPITAL_BASED_OUTPATIENT_CLINIC_OR_DEPARTMENT_OTHER): Payer: 59

## 2020-03-06 ENCOUNTER — Other Ambulatory Visit: Payer: Self-pay

## 2020-03-06 DIAGNOSIS — I2510 Atherosclerotic heart disease of native coronary artery without angina pectoris: Secondary | ICD-10-CM | POA: Insufficient documentation

## 2020-03-06 DIAGNOSIS — Z9861 Coronary angioplasty status: Secondary | ICD-10-CM

## 2020-03-06 DIAGNOSIS — I480 Paroxysmal atrial fibrillation: Secondary | ICD-10-CM

## 2020-03-06 DIAGNOSIS — R0609 Other forms of dyspnea: Secondary | ICD-10-CM

## 2020-03-06 DIAGNOSIS — I739 Peripheral vascular disease, unspecified: Secondary | ICD-10-CM

## 2020-03-06 DIAGNOSIS — R0600 Dyspnea, unspecified: Secondary | ICD-10-CM | POA: Insufficient documentation

## 2020-03-06 LAB — MYOCARDIAL PERFUSION IMAGING
LV dias vol: 120 mL (ref 62–150)
LV sys vol: 68 mL
Peak HR: 81 {beats}/min
Rest HR: 73 {beats}/min
SDS: 1
SRS: 0
SSS: 1
TID: 1.07

## 2020-03-06 LAB — ECHOCARDIOGRAM COMPLETE
Height: 71 in
S' Lateral: 3.7 cm
Weight: 4352 oz

## 2020-03-06 MED ORDER — REGADENOSON 0.4 MG/5ML IV SOLN
0.4000 mg | Freq: Once | INTRAVENOUS | Status: AC
  Administered 2020-03-06: 0.4 mg via INTRAVENOUS

## 2020-03-06 MED ORDER — PERFLUTREN LIPID MICROSPHERE
1.0000 mL | INTRAVENOUS | Status: AC | PRN
  Administered 2020-03-06: 1 mL via INTRAVENOUS

## 2020-03-06 MED ORDER — TECHNETIUM TC 99M TETROFOSMIN IV KIT
10.9000 | PACK | Freq: Once | INTRAVENOUS | Status: AC | PRN
  Administered 2020-03-06: 10.9 via INTRAVENOUS
  Filled 2020-03-06: qty 11

## 2020-03-06 MED ORDER — TECHNETIUM TC 99M TETROFOSMIN IV KIT
32.7000 | PACK | Freq: Once | INTRAVENOUS | Status: AC | PRN
  Administered 2020-03-06: 32.7 via INTRAVENOUS
  Filled 2020-03-06: qty 33

## 2020-03-09 ENCOUNTER — Telehealth: Payer: Self-pay | Admitting: Cardiology

## 2020-03-09 NOTE — Telephone Encounter (Signed)
Patient is returning Ivy's call. Transferred call to [facility].

## 2020-03-13 NOTE — Progress Notes (Signed)
Cardiology Office Note    Date:  03/15/2020   ID:  Ian [name], DOB 11-23-1970, MRN [id number]  PCP:  Patient, No Pcp Per  Cardiologist: Tobias Alexander, MD EPS: None  No chief complaint on file.   History of Present Illness:  Ian [name] is a 49 y.o. male with history of CAD status post NSTEMI 2016 treated with DES to the RCA with residual 25% mid circumflex, LVEF 45 to 50%.  He also had atrial fibrillation with RVR at that time, CHA2DS2-VASc was 2 and he was on aspirin Plavix and Eliquis for 30 days and then aspirin was stopped.  Abnormal stress test 2018 for possible inferior ischemia and cardiac cath showed 80% in-stent restenosis of the RCA treated with balloon angioplasty.  There was also a 90% mid RCA unable to cross with the balloon so underwent staged orbital atherectomy and DES placement 09/05/2016.  EF 40 to 45% at that time.  Patient also has hypertension, DM type II, HLD, tobacco abuse and obesity.   03/2018 overnight admission at [facility] CTA negative for PE, Lexiscan no ischemia.  Holter monitor 04/2018 showed atrial fibrillation 80% a lot of the time  times RVR metoprolol increased to 50 mg twice daily and chest pain resolved.   Cardiac cath 10/2018 showed widely patent stents with only mild restenosis, 50% mid circumflex nonsignificant by DFR, proximal LAD to mid LAD 25%, no aortic valve stenosis. Medical therapy recommended.   Patient saw Dr. [name] 02/08/20 complaining of DOE, elevated HR's and claudications walking short distance.  Lexiscan Myoview 03/06/2020 no ischemia.  Echo 03/06/2020 LVEF 50 to 55% slightly improved from 40 to 45% in 2018.  Lower extremity ABIs showed evidence of moderate left lower extremity arterial disease.  To see Dr. Allyson Sabal 03/15/2020  Patient comes in for f/u. 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 Smoking 1 1/2 ppd for 35 yrs. Was supposed to see pulmonary but got  cancelled with covid19.  Past Medical History:  Diagnosis Date  . Arthritis    "left ankle" (09/04/2016)  . Cardiomyopathy (HCC)    a. EF 40-45%  . Chest pain 04/22/2017  . Complication of anesthesia    "hard to get me out of it sometimes; usually takes me longer to get under"  . Coronary artery disease    a. multiple PCIs to RCA.  . Essential hypertension 12/19/2015  . Hyperlipidemia 05/21/2015  . Hypertension   . NSTEMI (non-ST elevated myocardial infarction) (HCC) 05/2015   [name] 05/23/2015 "minor one"  . PAF (paroxysmal atrial fibrillation) (HCC)   . Tobacco abuse 05/21/2015  . Type II diabetes mellitus (HCC)     Past Surgical History:  Procedure Laterality Date  . CARDIAC CATHETERIZATION  2000, 2005   "less than 20% blockage"  . CARDIAC CATHETERIZATION N/A 05/22/2015   Procedure: Left Heart Cath and Coronary Angiography;  Surgeon: [name], MD;  Location: [facility] INVASIVE CV LAB;  Service: Cardiovascular;  Laterality: N/A;  . CARDIAC CATHETERIZATION N/A 05/22/2015   Procedure: Coronary Stent Intervention;  Surgeon: [name], MD;  Location: [facility] INVASIVE CV LAB;  Service: Cardiovascular;  Laterality: N/A;  . CARDIAC CATHETERIZATION N/A 09/04/2016   Procedure: Left Heart Cath and Coronary Angiography;  Surgeon: [name], MD;  Location: [facility] INVASIVE CV LAB;  Service: Cardiovascular;  Laterality: N/A;  . CARDIAC CATHETERIZATION N/A 09/04/2016   Procedure:  Coronary Balloon Angioplasty;  Surgeon: [name], MD;  Location: [facility] INVASIVE CV LAB;  Service: Cardiovascular;  Laterality: N/A;  . CARDIAC CATHETERIZATION N/A 09/05/2016   Procedure: Coronary Atherectomy;  Surgeon: Marykay Lex, MD;  Location: [facility] INVASIVE CV LAB;  Service: Cardiovascular;  Laterality: N/A;  . CARDIAC CATHETERIZATION N/A 09/05/2016   Procedure: Coronary Stent Intervention;  Surgeon: Marykay Lex, MD;  Location: [facility] INVASIVE CV LAB;  Service: Cardiovascular;  Laterality: N/A;  .  CARDIAC CATHETERIZATION N/A 09/05/2016   Procedure: Temporary Pacemaker;  Surgeon: Marykay Lex, MD;  Location: [facility] INVASIVE CV LAB;  Service: Cardiovascular;  Laterality: N/A;  . CORONARY ANGIOPLASTY    . INTRAVASCULAR PRESSURE WIRE/FFR STUDY N/A 10/16/2018   Procedure: INTRAVASCULAR PRESSURE WIRE/FFR STUDY;  Surgeon: [name], MD;  Location: [facility] INVASIVE CV LAB;  Service: Cardiovascular;  Laterality: N/A;  . LEFT HEART CATH AND CORONARY ANGIOGRAPHY N/A 10/16/2018   Procedure: LEFT HEART CATH AND CORONARY ANGIOGRAPHY;  Surgeon: [name], MD;  Location: [facility] INVASIVE CV LAB;  Service: Cardiovascular;  Laterality: N/A;  . MULTIPLE TOOTH EXTRACTIONS  2013   "all of them"  . TYMPANOSTOMY TUBE PLACEMENT Bilateral 1970s    Current Medications: Current Meds  Medication Sig  . apixaban (ELIQUIS) 5 MG TABS tablet Take 1 tablet (5 mg total) by mouth 2 (two) times daily.  Marland Kitchen atorvastatin (LIPITOR) 80 MG tablet Take 1 tablet (80 mg total) by mouth daily at 6 PM.  . Cinnamon 500 MG capsule Take 500 mg by mouth 2 (two) times daily.   . clopidogrel (PLAVIX) 75 MG tablet TAKE 1 TABLET (  TOTAL) BY MOUTH DAILY  . dapagliflozin propanediol (FARXIGA) 5 MG TABS tablet Take 5 mg by mouth daily.  . furosemide (LASIX) 40 MG tablet Take 1 tablet (40 mg total) by mouth 2 (two) times daily.  Marland Kitchen glipiZIDE (GLUCOTROL) 5 MG tablet Take 5 mg by mouth 2 (two) times daily before a meal.   . lisinopril (ZESTRIL) 2.5 MG tablet Take 1 tablet (2.5 mg total) by mouth daily.  . metFORMIN (GLUCOPHAGE) 500 MG tablet Take 1 tablet (500 mg total) by mouth 2 (two) times daily with a meal.  . metoprolol tartrate (LOPRESSOR) 100 MG tablet Take 1 tablet (100 mg total) by mouth 2 (two) times daily.  . Omega-3 Fatty Acids (FISH OIL) 1200 MG CAPS Take 1,200 mg by mouth daily.   . sildenafil (VIAGRA) 50 MG tablet Take 1 tablet (50 mg total) by mouth daily as needed for erectile dysfunction.     Allergies:   Patient  has no known allergies.   Social History   Socioeconomic History  . Marital status: Married    Spouse name: Not on file  . Number of children: Not on file  . Years of education: Not on file  . Highest education level: Not on file  Occupational History  . Not on file  Tobacco Use  . Smoking status: Current Every Day Smoker    Packs/day: 2.00    Years: 35.00    Pack years: 70.00    Types: Cigarettes, E-cigarettes  . Smokeless tobacco: Never Used  Vaping Use  . Vaping Use: Never used  Substance and Sexual Activity  . Alcohol use: Yes    Alcohol/week: 0.0 standard drinks    Comment: 09/04/2016  "might have a few drinks/year; nothing since 05/2015"  . Drug use: Yes    Types: Cocaine, Marijuana    Comment: 09/04/2016 "haven't touched drugs since  1988"  . Sexual activity: Yes  Other Topics Concern  . 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   Social Determinants of Health   Financial Resource Strain:   . Difficulty of Paying Living Expenses:   Food Insecurity:   . Worried About Programme researcher, broadcasting/film/video in the Last Year:   . Barista in the Last Year:   Transportation Needs:   . Freight forwarder (Medical):   Marland Kitchen Lack of Transportation (Non-Medical):   Physical Activity:   . Days of Exercise per Week:   . Minutes of Exercise per Session:   Stress:   . Feeling of Stress :   Social Connections:   . Frequency of Communication with Friends and Family:   . Frequency of Social Gatherings with Friends and Family:   . Attends Religious Services:   . Active Member of Clubs or Organizations:   . Attends Banker Meetings:   Marland Kitchen Marital Status:      Family History:  The patient's family history includes Diabetes in his maternal grandmother, mother, and sister; Heart attack in his father.   ROS:   Please see the history of present illness.    ROS All other systems reviewed and are negative.   PHYSICAL  EXAM:   VS:  BP 118/84   Pulse (!) 58   Ht 5\' 11"  (1.803 m)   Wt 268 lb (121.6 kg)   SpO2 98%   BMI 37.38 kg/m   Physical Exam  GEN: Obese, in no acute distress  Neck: no JVD, carotid bruits, or masses Cardiac:distant HS, RRR; no murmurs, rubs, or gallops  Respiratory:  Decreased breath sounds but clear to auscultation bilaterally, normal work of breathing GI: soft, nontender, nondistended, + BS Ext: without cyanosis, clubbing, or edema, Good distal pulses bilaterally Neuro:  Alert and Oriented x 3 Psych: euthymic mood, full affect  Wt Readings from Last 3 Encounters:  03/15/20 268 lb (121.6 kg)  03/06/20 (!) 272 lb (123.4 kg)  02/08/20 272 lb 12.8 oz (123.7 kg)      Studies/Labs Reviewed:   EKG:  EKG is not ordered today.   Recent Labs: 07/20/2019: ALT 22; BUN 12; Creatinine, Ser 0.97; Hemoglobin 19.2; Platelets 166; Potassium 4.2; Sodium 141; TSH 1.130   Lipid Panel    Component Value Date/Time   CHOL 95 (L) 07/20/2019 1026   TRIG 169 (H) 07/20/2019 1026   HDL 27 (L) 07/20/2019 1026   CHOLHDL 3.5 07/20/2019 1026   CHOLHDL 7.9 05/22/2015 0233   VLDL UNABLE TO CALCULATE IF TRIGLYCERIDE OVER 400 mg/dL 07/22/2015 28/76/8115   LDLCALC 40 07/20/2019 1026    Additional studies/ records that were reviewed today include:   Lexiscan 7/26/2021Nuclear stress EF: 43%.  This is an intermediate risk study.  The left ventricular ejection fraction is moderately decreased (30-44%).  There was no ST segment deviation noted during stress.   Normal resting and stress perfusion. No ischemia or infarction EF LVE with global hypokinesis EF 43% Intermediate risk based on EF   03/06/2020 IMPRESSIONS     1. Poor acoustic windows Basal inferior/inferoseptal hypokinesis . Left  ventricular ejection fraction, by estimation, is 50 to 55%). Left  ventricular diastolic parameters are indeterminate.   2. Right ventricular systolic function is mildly reduced. The right  ventricular size is  mildly enlarged.   3. Right atrial size was mildly dilated.   4. The mitral  valve is grossly normal. Mild mitral valve regurgitation.   5. The aortic valve is normal in structure. Aortic valve regurgitation is  not visualized.   6. The inferior vena cava is normal in size with greater than 50%  respiratory variability, suggesting right atrial pressure of 3 mmHg.   Comparison(s): 08/30/16 EF 40-45%.   FINDINGS   Left Ventricle: Poor acoustic windows Basal inferior/inferoseptal  hypokinesis. Left ventricular ejection fraction, by estimation, is 50 to  55%. The left ventricle has low normal function. The left ventricle  demonstrates regional wall motion  abnormalities. The left ventricular internal cavity size was normal in  size. There is no left ventricular hypertrophy. Left ventricular diastolic  parameters are indeterminate.   Right Ventricle: The right ventricular size is mildly enlarged. Right  vetricular wall thickness was not assessed. Right ventricular systolic  function is mildly reduced.   Left Atrium: Left atrial size was normal in size.   Right Atrium: Right atrial size was mildly dilated.   Pericardium: There is no evidence of pericardial effusion.   Mitral Valve: The mitral valve is grossly normal. Mild mitral annular  calcification. Mild mitral valve regurgitation.   Tricuspid Valve: The tricuspid valve is normal in structure. Tricuspid  valve regurgitation is trivial.   Aortic Valve: The aortic valve is normal in structure. Aortic valve  regurgitation is not visualized.   Pulmonic Valve: The pulmonic valve was normal in structure. Pulmonic valve  regurgitation is mild.   Aorta: The aortic root is normal in size and structure.   Venous: The inferior vena cava is normal in size with greater than 50%  respiratory variability, suggesting right atrial pressure of 3 mmHg.   IAS/Shunts: The interatrial septum was not assessed.     Cardiac cath  10/16/2018  Previously placed Prox RCA to Mid RCA drug eluting stents are widely patent, with only mild restenosis.  Mid Cx lesion is 50% stenosed. Not significant by DFR.  Prox LAD to Mid LAD lesion is 25% stenosed.  LV end diastolic pressure is normal.  There is no aortic valve stenosis.   COntinue aggressive medical therapy.    Lower extremity ABIs 03/06/2020 Summary:  Right: Resting right ankle-brachial index is within normal range. No  evidence of significant right lower extremity arterial disease. The right  toe-brachial index is normal.   ABIs decreased by .12 and TBIs increased by .19.  Left: Resting left ankle-brachial index indicates moderate left lower  extremity arterial disease. The left toe-brachial index is abnormal.   ABIs decreased by .38 and TBIs decreased by .45.    *See table(s) above for measurements and observations.  See LE Arterial duplex report.   Vascular consult recommended.  Electronically signed by Lorine Bears MD on 03/09/2020 at 7:46:56  AM.Scheduled to consult with Dr. Allyson Sabal on March 15, 2020.         Final       ASSESSMENT:    1. Dyspnea on exertion   2. Claudication (HCC)   3. Coronary artery disease involving native coronary artery of native heart without angina pectoris   4. Paroxysmal atrial fibrillation (HCC)   5. Chronic diastolic CHF (congestive heart failure) (HCC)   6. Essential hypertension   7. Hyperlipidemia, unspecified hyperlipidemia type   8. Tobacco abuse      PLAN:  In order of problems listed above:  Dyspnea on exertion with no evidence of ischemia on Lexiscan 03/06/2020 echo with improved LVEF 50 to 55%. Patient's HR gets up with strenuous  exertion associated with shortness of breath. Will check Zio monitor  PAD with moderate left lower ext arterial disease on ABIs to see Dr. Allyson SabalBerry 03/15/2020  CAD status post NSTEMI 2016 treated with DES to the RCA with residual 25% mid circumflex, in-stent restenosis 2018  treated with balloon angioplasty with 90% mid RCA unable to cross with balloon so underwent staged orbital atherectomy and DES placement 09/05/2016, last cath 10/2018 patent stents with only mild restenosis 50% mid circumflex 25% proximal to mid LAD, and no ischemia on Lexiscan 03/06/2020  PAF with RVR in the setting of MI in 2016 and again on Holter 2019 -HR up to 120 with heavy exertion on metoprolol 100 mg bid. Will check holter.  Chronic diastolic CHF compensated   Hypertension well controlled  Hyperlipidemia LDL 40 07/2019  Tobacco abuse smoking 1 1/2 ppd and hasn't been able to quit with patches/gum/wellbutrin. 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 Never had CT. Will refer to pulmonary.  Medication Adjustments/Labs and Tests Ordered: Current medicines are reviewed at length with the patient today.  Concerns regarding medicines are outlined above.  Medication changes, Labs and Tests ordered today are listed in the Patient Instructions below. Patient Instructions  Medication Instructions:  Your physician recommends that you continue on your current medications as directed. Please refer to the Current Medication list given to you today.  *If you need a refill on your cardiac medications before your next appointment, please call your pharmacy*   Lab Work: None If you have labs (blood work) drawn today and your tests are completely normal, you will receive your results only by: Marland Kitchen. MyChart Message (if you have MyChart) OR . A paper copy in the mail If you have any lab test that is abnormal or we need to change your treatment, we will call you to review the results.   Testing/Procedures: Your physician has recommended that you wear a zio monitor. These monitors are medical devices that record the heart's electrical activity. Doctors most often use these monitors to diagnose arrhythmias. Arrhythmias are problems with the speed or rhythm of the heartbeat. The monitor is a small, portable device.  You can wear one while you do your normal daily activities. This is usually used to diagnose what is causing palpitations/syncope (passing out).     Follow-Up: At [facility], you and your health needs are our priority.  As part of our continuing mission to provide you with exceptional heart care, we have created designated Provider Care Teams.  These Care Teams include your primary Cardiologist (physician) and Advanced Practice Providers (APPs -  Physician Assistants and Nurse Practitioners) who all work together to provide you with the care you need, when you need it.  We recommend signing up for the patient portal called "MyChart".  Sign up information is provided on this After Visit Summary.  MyChart is used to connect with patients for Virtual Visits (Telemedicine).  Patients are able to view lab/test results, encounter notes, upcoming appointments, etc.  Non-urgent messages can be sent to your provider as well.   To learn more about what you can do with MyChart, go to [email or web address].    Your next appointment:   07/19/2020  The format for your next appointment:   In Person  Provider:   Tobias AlexanderKatarina [name], MD   Other Instructions  Christena DeemZIO XT- Long Term Monitor Instructions   Your physician has requested you wear your ZIO patch monitor__14_____days.   This is a single patch monitor.  [employer] supplies one  patch monitor per enrollment.  Additional stickers are not available.   Please do not apply patch if you will be having a Nuclear Stress Test, Echocardiogram, Cardiac CT, MRI, or Chest Xray during the time frame you would be wearing the monitor. The patch cannot be worn during these tests.  You cannot remove and re-apply the ZIO XT patch monitor.   Your ZIO patch monitor will be sent USPS Priority mail from [facility] directly to your home address. The monitor may also be mailed to a PO BOX if home delivery is not available.   It may take 3-5 days to receive your  monitor after you have been enrolled.   Once you have received you monitor, please review enclosed instructions.  Your monitor has already been registered assigning a specific monitor serial # to you.   Applying the monitor   Shave hair from upper left chest.   Hold abrader disc by orange tab.  Rub abrader in 40 strokes over left upper chest as indicated in your monitor instructions.   Clean area with 4 enclosed alcohol pads .  Use all pads to assure are is cleaned thoroughly.  Let dry.   Apply patch as indicated in monitor instructions.  Patch will be place under collarbone on left side of chest with arrow pointing upward.   Rub patch adhesive wings for 2 minutes.Remove white label marked "1".  Remove white label marked "2".  Rub patch adhesive wings for 2 additional minutes.   While looking in a mirror, press and release button in center of patch.  A small green light will flash 3-4 times .  This will be your only indicator the monitor has been turned on.     Do not shower for the first 24 hours.  You may shower after the first 24 hours.   Press button if you feel a symptom. You will hear a small click.  Record Date, Time and Symptom in the Patient Log Book.   When you are ready to remove patch, follow instructions on last 2 pages of Patient Log Book.  Stick patch monitor onto last page of Patient Log Book.   Place Patient Log Book in [place] box.  Use locking tab on box and tape box closed securely.  The [employer] and [employer] has [employer] on it.  Please place in mailbox as soon as possible.  Your physician should have your test results approximately 7 days after the monitor has been mailed back to [facility].   Call [facility] Customer Care at [phone] if you have questions regarding your ZIO XT patch monitor.  Call them immediately if you see an orange light blinking on your monitor.   If your monitor falls off in less than 4 days contact our Monitor department at  [phone].  If your monitor becomes loose or falls off after 4 days call [employer] at [phone] for suggestions on securing your monitor.       Signed, Jacolyn Reedy, [name]  03/15/2020 8:25 AM    [facility] [address] [place], [place], [place]  [address] Phone: [phone]; Fax: [phone]

## 2020-03-15 ENCOUNTER — Ambulatory Visit: Payer: 59 | Admitting: Physician Assistant

## 2020-03-15 ENCOUNTER — Ambulatory Visit: Payer: 59 | Admitting: Cardiovascular Disease

## 2020-03-15 ENCOUNTER — Encounter: Payer: Self-pay | Admitting: Cardiovascular Disease

## 2020-03-15 ENCOUNTER — Encounter: Payer: Self-pay | Admitting: Physician Assistant

## 2020-03-15 ENCOUNTER — Other Ambulatory Visit: Payer: Self-pay

## 2020-03-15 ENCOUNTER — Telehealth: Payer: Self-pay | Admitting: Radiology

## 2020-03-15 VITALS — BP 120/78 | HR 89 | Ht 71.0 in | Wt 267.6 lb

## 2020-03-15 VITALS — BP 118/84 | HR 58 | Ht 71.0 in | Wt 268.0 lb

## 2020-03-15 DIAGNOSIS — R0600 Dyspnea, unspecified: Secondary | ICD-10-CM

## 2020-03-15 DIAGNOSIS — I480 Paroxysmal atrial fibrillation: Secondary | ICD-10-CM | POA: Diagnosis not present

## 2020-03-15 DIAGNOSIS — I10 Essential (primary) hypertension: Secondary | ICD-10-CM

## 2020-03-15 DIAGNOSIS — I2510 Atherosclerotic heart disease of native coronary artery without angina pectoris: Secondary | ICD-10-CM | POA: Diagnosis not present

## 2020-03-15 DIAGNOSIS — I739 Peripheral vascular disease, unspecified: Secondary | ICD-10-CM | POA: Diagnosis not present

## 2020-03-15 DIAGNOSIS — R0609 Other forms of dyspnea: Secondary | ICD-10-CM

## 2020-03-15 DIAGNOSIS — Z01812 Encounter for preprocedural laboratory examination: Secondary | ICD-10-CM

## 2020-03-15 DIAGNOSIS — E785 Hyperlipidemia, unspecified: Secondary | ICD-10-CM

## 2020-03-15 DIAGNOSIS — I5032 Chronic diastolic (congestive) heart failure: Secondary | ICD-10-CM

## 2020-03-15 DIAGNOSIS — Z720 Tobacco use: Secondary | ICD-10-CM

## 2020-03-15 LAB — BASIC METABOLIC PANEL
BUN/Creatinine Ratio: 18 (ref 9–20)
BUN: 19 mg/dL (ref 6–24)
CO2: 23 mmol/L (ref 20–29)
Calcium: 9.8 mg/dL (ref 8.7–10.2)
Chloride: 99 mmol/L (ref 96–106)
Creatinine, Ser: 1.07 mg/dL (ref 0.76–1.27)
GFR calc Af Amer: 92 mL/min/{1.73_m2} (ref >59)
GFR calc non Af Amer: 80 mL/min/{1.73_m2} (ref >59)
Glucose: 103 mg/dL — ABNORMAL HIGH (ref 65–99)
Potassium: 4.5 mmol/L (ref 3.5–5.2)
Sodium: 139 mmol/L (ref 134–144)

## 2020-03-15 LAB — CBC
Hematocrit: 52.7 % — ABNORMAL HIGH (ref 37.5–51.0)
Hemoglobin: 18.5 g/dL — ABNORMAL HIGH (ref 13.0–17.7)
MCH: 33.2 pg — ABNORMAL HIGH (ref 26.6–33.0)
MCHC: 35.1 g/dL (ref 31.5–35.7)
MCV: 95 fL (ref 79–97)
Platelets: 161 10*3/uL (ref 150–450)
RBC: 5.57 x10E6/uL (ref 4.14–5.80)
RDW: 12.5 % (ref 11.6–15.4)
WBC: 7.1 10*3/uL (ref 3.4–10.8)

## 2020-03-15 NOTE — Telephone Encounter (Signed)
Enrolled patient for a 14 day Zio monitor to be mailed to patiens home.  

## 2020-03-15 NOTE — Assessment & Plan Note (Signed)
Mr. Husted was referred to me by Dr. [name] for evaluation of symptomatic PAD.  He does have multiple cardiac risk factors and known CAD.  He had lifestyle limiting left lower extremity claudication for at least 4 years which is now somewhat disabling.  He had Doppler studies performed 03/06/2020 revealing a right ABI of 0.99 with a left of 0.53 with what appears to be an occluded left common iliac artery.  He wishes to proceed with angiography and potential endovascular therapy for lifestyle limiting claudication.  He will need to stop his Eliquis 2 days prior to the procedure.  I reiterated the importance of smoking cessation for maintaining patency of any stent.

## 2020-03-15 NOTE — Patient Instructions (Addendum)
Medication Instructions:  Your physician recommends that you continue on your current medications as directed. Please refer to the Current Medication list given to you today.  *If you need a refill on your cardiac medications before your next appointment, please call your pharmacy*   Lab Work: None If you have labs (blood work) drawn today and your tests are completely normal, you will receive your results only by: Marland Kitchen MyChart Message (if you have MyChart) OR . A paper copy in the mail If you have any lab test that is abnormal or we need to change your treatment, we will call you to review the results.   Testing/Procedures: Your physician has recommended that you wear a zio monitor. These monitors are medical devices that record the heart's electrical activity. Doctors most often use these monitors to diagnose arrhythmias. Arrhythmias are problems with the speed or rhythm of the heartbeat. The monitor is a small, portable device. You can wear one while you do your normal daily activities. This is usually used to diagnose what is causing palpitations/syncope (passing out).     Follow-Up: At [facility], you and your health needs are our priority.  As part of our continuing mission to provide you with exceptional heart care, we have created designated Provider Care Teams.  These Care Teams include your primary Cardiologist (physician) and Advanced Practice Providers (APPs -  Physician Assistants and Nurse Practitioners) who all work together to provide you with the care you need, when you need it.  We recommend signing up for the patient portal called "MyChart".  Sign up information is provided on this After Visit Summary.  MyChart is used to connect with patients for Virtual Visits (Telemedicine).  Patients are able to view lab/test results, encounter notes, upcoming appointments, etc.  Non-urgent messages can be sent to your provider as well.   To learn more about what you can do with  MyChart, go to [email or web address].    Your next appointment:   07/19/2020  The format for your next appointment:   In Person  Provider:   Tobias Alexander, MD   Other Instructions  Ian [name]- Long Term Monitor Instructions   Your physician has requested you wear your ZIO patch monitor__14_____days.   This is a single patch monitor.  [employer] supplies one patch monitor per enrollment.  Additional stickers are not available.   Please do not apply patch if you will be having a Nuclear Stress Test, Echocardiogram, Cardiac CT, MRI, or Chest Xray during the time frame you would be wearing the monitor. The patch cannot be worn during these tests.  You cannot remove and re-apply the ZIO XT patch monitor.   Your ZIO patch monitor will be sent USPS Priority mail from [facility] directly to your home address. The monitor may also be mailed to a PO BOX if home delivery is not available.   It may take 3-5 days to receive your monitor after you have been enrolled.   Once you have received you monitor, please review enclosed instructions.  Your monitor has already been registered assigning a specific monitor serial # to you.   Applying the monitor   Shave hair from upper left chest.   Hold abrader disc by orange tab.  Rub abrader in 40 strokes over left upper chest as indicated in your monitor instructions.   Clean area with 4 enclosed alcohol pads .  Use all pads to assure are is cleaned thoroughly.  Let dry.   Apply patch as indicated  in monitor instructions.  Patch will be place under collarbone on left side of chest with arrow pointing upward.   Rub patch adhesive wings for 2 minutes.Remove white label marked "1".  Remove white label marked "2".  Rub patch adhesive wings for 2 additional minutes.   While looking in a mirror, press and release button in center of patch.  A small green light will flash 3-4 times .  This will be your only indicator the monitor has been turned on.      Do not shower for the first 24 hours.  You may shower after the first 24 hours.   Press button if you feel a symptom. You will hear a small click.  Record Date, Time and Symptom in the Patient Log Book.   When you are ready to remove patch, follow instructions on last 2 pages of Patient Log Book.  Stick patch monitor onto last page of Patient Log Book.   Place Patient Log Book in [place] box.  Use locking tab on box and tape box closed securely.  The [employer] and [employer] has [employer] on it.  Please place in mailbox as soon as possible.  Your physician should have your test results approximately 7 days after the monitor has been mailed back to [facility].   Call [facility] Customer Care at [phone] if you have questions regarding your ZIO XT patch monitor.  Call them immediately if you see an orange light blinking on your monitor.   If your monitor falls off in less than 4 days contact our Monitor department at [phone].  If your monitor becomes loose or falls off after 4 days call [employer] at [phone] for suggestions on securing your monitor.

## 2020-03-15 NOTE — Patient Instructions (Addendum)
Medication Instructions:  CONTINUE current medications *If you need a refill on your cardiac medications before your next appointment, please call your pharmacy*   Lab Work: BMET & CBC - prior to PV procedure  You will need to have the coronavirus test completed prior to your procedure. An appointment has been made at 03/24/2020 on 2:50pm. This is a Drive Up Visit at the [address] [place]. Please tell them that you are there for pre-procedure testing. Someone will direct you to the appropriate testing line. Stay in your car and someone will be with you shortly. Please make sure to have all other labs completed before this test because you will need to stay quarantined until your procedure. Please take your insurance card to this test.    If you have labs (blood work) drawn today and your tests are completely normal, you will receive your results only by: Marland Kitchen MyChart Message (if you have MyChart) OR . A paper copy in the mail If you have any lab test that is abnormal or we need to change your treatment, we will call you to review the results.   Testing/Procedures: PV angiogram with Dr. Allyson Sabal @ [facility]  Lower Extremity Arterial Doppler 1 week post-procedure   Follow-Up: At [facility], you and your health needs are our priority.  As part of our continuing mission to provide you with exceptional heart care, we have created designated Provider Care Teams.  These Care Teams include your primary Cardiologist (physician) and Advanced Practice Providers (APPs -  Physician Assistants and Nurse Practitioners) who all work together to provide you with the care you need, when you need it.  We recommend signing up for the patient portal called "MyChart".  Sign up information is provided on this After Visit Summary.  MyChart is used to connect with patients for Virtual Visits (Telemedicine).  Patients are able to view lab/test results, encounter notes, upcoming appointments, etc.   Non-urgent messages can be sent to your provider as well.   To learn more about what you can do with MyChart, go to [email or web address].    Your next appointment:   2 week(s)  The format for your next appointment:   In Person  Provider:   Nanetta Batty, MD   Other Instructions     [facility] CARDIOVASCULAR DIVISION [facility] [address] [place] [address] Dept: [phone] Loc: [phone]  Ian [name]  03/15/2020  You are scheduled for a Peripheral Angiogram on Monday, August 16 with Dr. Nanetta [name].  1. Please arrive at the [facility] (Main Entrance A) at [facility]: [address] [place], [place] [address] at 7:30 AM (This time is two hours before your procedure to ensure your preparation). Free valet parking service is available.   Special note: Every effort is made to have your procedure done on time. Please understand that emergencies sometimes delay scheduled procedures.  2. Diet: Do not eat solid foods after midnight.  The patient may have clear liquids until 5am upon the day of the procedure.  3. Labs: You will need to have blood drawn TODAY 03/15/2020  4. Medication instructions in preparation for your procedure:  HOLD Eliquis 2 days prior to procedure  HOLD ALL diabetic medications the morning of procedure and hold Metformin for 48 hours post-procedure    On the morning of your procedure, take your Plavix/Clopidogrel and any morning medicines NOT listed above.  You may use sips of  water.  5. Plan for one night stay--bring personal belongings. 6. Bring a current list of your medications and current insurance cards. 7. You MUST have a responsible person to drive you home. 8. Someone MUST be with you the first 24 hours after you arrive home or your discharge will be delayed. 9. Please wear clothes that are easy to get on and off and wear slip-on shoes.  Thank you for allowing [place]  to care for you!   -- [facility]

## 2020-03-15 NOTE — H&P (View-Only) (Signed)
03/15/2020 Ian [name]   03/19/1971  [id number]  Primary Physician Patient, No Pcp Per Primary Cardiologist: Runell Gess MD Nicholes Calamity, [place]  HPI:  Ian [name] is a 49 y.o. moderately overweight married Caucasian male father of 3 children, grandfather of 3 grandchildren accompanied by his wife Ian [name] today.  He was referred by Dr. [name], his cardiologist, for peripheral vascular evaluation because of lifestyle imaging claudication.  He works as a Agricultural consultant.  His risk factors include 60-pack-year tobacco abuse continue to smoke 1-1/2 packs a day recalcitrant to risk factor modification.  He has treated diabetes and hyperlipidemia.  He also has PAF on Eliquis.  His family history is remarkable for father who apparently had CAD.  He has had multiple coronary interventions dating back to 2016 when he had RCA stent.  He had orbital atherectomy his RCA restenting 09/05/2016.  He has had claudication for 4 years primarily on the left side with recent Doppler studies performed 03/06/2020 revealing a right ABI that was normal and a left of 0.53 with an occluded left common iliac artery.   Current Meds  Medication Sig  . apixaban (ELIQUIS) 5 MG TABS tablet Take 1 tablet (5 mg total) by mouth 2 (two) times daily.  Marland Kitchen atorvastatin (LIPITOR) 80 MG tablet Take 1 tablet (80 mg total) by mouth daily at 6 PM.  . Cinnamon 500 MG capsule Take 500 mg by mouth 2 (two) times daily.   . clopidogrel (PLAVIX) 75 MG tablet TAKE 1 TABLET (75MG  TOTAL) BY MOUTH DAILY  . dapagliflozin propanediol (FARXIGA) 5 MG TABS tablet Take 5 mg by mouth daily.  . furosemide (LASIX) 40 MG tablet Take 1 tablet (40 mg total) by mouth 2 (two) times daily.  glipiZIDE (GLUCOTROL) 5 MG tablet Take 5 mg by mouth 2 (two) times daily before a meal.   . lisinopril (ZESTRIL) 2.5 MG tablet Take 1 tablet (2.5 mg total) by mouth daily.  . metFORMIN (GLUCOPHAGE) 500 MG tablet Take 1 tablet (500 mg total) by mouth 2  (two) times daily with a meal.  . metoprolol tartrate (LOPRESSOR) 100 MG tablet Take 1 tablet (100 mg total) by mouth 2 (two) times daily.  . Omega-3 Fatty Acids (FISH OIL) 1200 MG CAPS Take 1,200 mg by mouth daily.   . sildenafil (VIAGRA) 50 MG tablet Take 1 tablet (50 mg total) by mouth daily as needed for erectile dysfunction.     No Known Allergies  Social History   Socioeconomic History  . Marital status: Married    Spouse name: Not on file  . Number of children: Not on file  . Years of education: Not on file  . Highest education level: Not on file  Occupational History  . Not on file  Tobacco Use  . Smoking status: Current Every Day Smoker    Packs/day: 2.00    Years: 35.00    Pack years: 70.00    Types: Cigarettes  . Smokeless tobacco: Never Used  Vaping Use  . Vaping Use: Never used  Substance and Sexual Activity  . Alcohol use: Yes    Alcohol/week: 0.0 standard drinks    Comment: 09/04/2016  "might have a few drinks/year; nothing since 05/2015"  . Drug use: Yes    Types: Cocaine, Marijuana    Comment: 09/04/2016 "haven't touched drugs since 1988"  . Sexual activity: Yes  Other Topics Concern  . Not on file  Social History Narrative   Employed as a  Truck Driver. Resides in [place] and [place].   Social Determinants of Health   Financial Resource Strain:   . Difficulty of Paying Living Expenses:   Food Insecurity:   . Worried About Running Out of Food in the Last Year:   . Ran Out of Food in the Last Year:   Transportation Needs:   . Lack of Transportation (Medical):   . Lack of Transportation (Non-Medical):   Physical Activity:   . Days of Exercise per Week:   . Minutes of Exercise per Session:   Stress:   . Feeling of Stress :   Social Connections:   . Frequency of Communication with Friends and Family:   . Frequency of Social Gatherings with Friends and Family:   . Attends Religious Services:   . Active Member of Clubs or Organizations:   . Attends  Club or Organization Meetings:   . Marital Status:   Intimate Partner Violence:   . Fear of Current or Ex-Partner:   . Emotionally Abused:   . Physically Abused:   . Sexually Abused:      Review of Systems: General: negative for chills, fever, night sweats or weight changes.  Cardiovascular: negative for chest pain, dyspnea on exertion, edema, orthopnea, palpitations, paroxysmal nocturnal dyspnea or shortness of breath Dermatological: negative for rash Respiratory: negative for cough or wheezing Urologic: negative for hematuria Abdominal: negative for nausea, vomiting, diarrhea, bright red blood per rectum, melena, or hematemesis Neurologic: negative for visual changes, syncope, or dizziness All other systems reviewed and are otherwise negative except as noted above.    Blood pressure 120/78, pulse 89, height 5' 11" (1.803 m), weight 267 lb 9.6 oz (121.4 kg), SpO2 100 %.  General appearance: alert and no distress Neck: no adenopathy, no carotid bruit, no JVD, supple, symmetrical, trachea midline and thyroid not enlarged, symmetric, no tenderness/mass/nodules Lungs: clear to auscultation bilaterally Heart: regular rate and rhythm, S1, S2 normal, no murmur, click, rub or gallop Extremities: extremities normal, atraumatic, no cyanosis or edema Pulses: Diminished blood pedal pulse Skin: Skin color, texture, turgor normal. No rashes or lesions Neurologic: Alert and oriented X 3, normal strength and tone. Normal symmetric reflexes. Normal coordination and gait  EKG not performed today  ASSESSMENT AND PLAN:   Peripheral arterial disease (HCC) Mr. Swart was referred to me by Dr. [name] for evaluation of symptomatic PAD.  He does have multiple cardiac risk factors and known CAD.  He had lifestyle limiting left lower extremity claudication for at least 4 years which is now somewhat disabling.  He had Doppler studies performed 03/06/2020 revealing a right ABI of 0.99 with a left of 0.53  with what appears to be an occluded left common iliac artery.  He wishes to proceed with angiography and potential endovascular therapy for lifestyle limiting claudication.  He will need to stop his Eliquis 2 days prior to the procedure.  I reiterated the importance of smoking cessation for maintaining patency of any stent.      Keltin Baird J. Chavez Rosol MD FACP,FACC,FAHA, FSCAI 03/15/2020 10:37 AM 

## 2020-03-15 NOTE — Progress Notes (Signed)
03/15/2020 Ian [name]   03/19/1971  [id number]  Primary Physician Patient, No Pcp Per Primary Cardiologist: Runell Gess MD Nicholes Calam[place], [place]  HPI:  Ian [name] is a 49 y.o. moderately overweight married Caucasian male father of 3 children, grandfather of 3 grandchildren accompanied by his wife Ian [name] today.  He was referred by Dr. [name], his cardiologist, for peripheral vascular evaluation because of lifestyle imaging claudication.  He works as a Agricultural consultant.  His risk factors include 60-pack-year tobacco abuse continue to smoke 1-1/2 packs a day recalcitrant to risk factor modification.  He has treated diabetes and hyperlipidemia.  He also has PAF on Eliquis.  His family history is remarkable for father who apparently had CAD.  He has had multiple coronary interventions dating back to 2016 when he had RCA stent.  He had orbital atherectomy his RCA restenting 09/05/2016.  He has had claudication for 4 years primarily on the left side with recent Doppler studies performed 03/06/2020 revealing a right ABI that was normal and a left of 0.53 with an occluded left common iliac artery.   Current Meds  Medication Sig  . apixaban (ELIQUIS) 5 MG TABS tablet Take 1 tablet (5 mg total) by mouth 2 (two) times daily.  Marland Kitchen atorvastatin (LIPITOR) 80 MG tablet Take 1 tablet (80 mg total) by mouth daily at 6 PM.  . Cinnamon 500 MG capsule Take 500 mg by mouth 2 (two) times daily.   . clopidogrel (PLAVIX) 75 MG tablet TAKE 1 TABLET (75MG  TOTAL) BY MOUTH DAILY  . dapagliflozin propanediol (FARXIGA) 5 MG TABS tablet Take 5 mg by mouth daily.  . furosemide (LASIX) 40 MG tablet Take 1 tablet (40 mg total) by mouth 2 (two) times daily.  glipiZIDE (GLUCOTROL) 5 MG tablet Take 5 mg by mouth 2 (two) times daily before a meal.   . lisinopril (ZESTRIL) 2.5 MG tablet Take 1 tablet (2.5 mg total) by mouth daily.  . metFORMIN (GLUCOPHAGE) 500 MG tablet Take 1 tablet (500 mg total) by mouth 2  (two) times daily with a meal.  . metoprolol tartrate (LOPRESSOR) 100 MG tablet Take 1 tablet (100 mg total) by mouth 2 (two) times daily.  . Omega-3 Fatty Acids (FISH OIL) 1200 MG CAPS Take 1,200 mg by mouth daily.   . sildenafil (VIAGRA) 50 MG tablet Take 1 tablet (50 mg total) by mouth daily as needed for erectile dysfunction.     No Known Allergies  Social History   Socioeconomic History  . Marital status: Married    Spouse name: Not on file  . Number of children: Not on file  . Years of education: Not on file  . Highest education level: Not on file  Occupational History  . Not on file  Tobacco Use  . Smoking status: Current Every Day Smoker    Packs/day: 2.00    Years: 35.00    Pack years: 70.00    Types: Cigarettes  . Smokeless tobacco: Never Used  Vaping Use  . Vaping Use: Never used  Substance and Sexual Activity  . Alcohol use: Yes    Alcohol/week: 0.0 standard drinks    Comment: 09/04/2016  "might have a few drinks/year; nothing since 05/2015"  . Drug use: Yes    Types: Cocaine, Marijuana    Comment: 09/04/2016 "haven't touched drugs since 1988"  . Sexual activity: Yes  Other Topics Concern  . Not on file  Social History Narrative   Employed as a  Truck Hospital doctor. Resides in [place] and [place].   Social Determinants of Health   Financial Resource Strain:   . Difficulty of Paying Living Expenses:   Food Insecurity:   . Worried About Programme researcher, broadcasting/film/video in the Last Year:   . Barista in the Last Year:   Transportation Needs:   . Freight forwarder (Medical):   Marland Kitchen Lack of Transportation (Non-Medical):   Physical Activity:   . Days of Exercise per Week:   . Minutes of Exercise per Session:   Stress:   . Feeling of Stress :   Social Connections:   . Frequency of Communication with Friends and Family:   . Frequency of Social Gatherings with Friends and Family:   . Attends Religious Services:   . Active Member of Clubs or Organizations:   . Attends  Banker Meetings:   Marland Kitchen Marital Status:   Intimate Partner Violence:   . Fear of Current or Ex-Partner:   . Emotionally Abused:   Marland Kitchen Physically Abused:   . Sexually Abused:      Review of Systems: General: negative for chills, fever, night sweats or weight changes.  Cardiovascular: negative for chest pain, dyspnea on exertion, edema, orthopnea, palpitations, paroxysmal nocturnal dyspnea or shortness of breath Dermatological: negative for rash Respiratory: negative for cough or wheezing Urologic: negative for hematuria Abdominal: negative for nausea, vomiting, diarrhea, bright red blood per rectum, melena, or hematemesis Neurologic: negative for visual changes, syncope, or dizziness All other systems reviewed and are otherwise negative except as noted above.    Blood pressure 120/78, pulse 89, height 5\' 11"  (1.803 m), weight 267 lb 9.6 oz (121.4 kg), SpO2 100 %.  General appearance: alert and no distress Neck: no adenopathy, no carotid bruit, no JVD, supple, symmetrical, trachea midline and thyroid not enlarged, symmetric, no tenderness/mass/nodules Lungs: clear to auscultation bilaterally Heart: regular rate and rhythm, S1, S2 normal, no murmur, click, rub or gallop Extremities: extremities normal, atraumatic, no cyanosis or edema Pulses: Diminished blood pedal pulse Skin: Skin color, texture, turgor normal. No rashes or lesions Neurologic: Alert and oriented X 3, normal strength and tone. Normal symmetric reflexes. Normal coordination and gait  EKG not performed today  ASSESSMENT AND PLAN:   Peripheral arterial disease [facility]) Mr. Monnier was referred to me by Dr. Mliss Fritz for evaluation of symptomatic PAD.  He does have multiple cardiac risk factors and known CAD.  He had lifestyle limiting left lower extremity claudication for at least 4 years which is now somewhat disabling.  He had Doppler studies performed 03/06/2020 revealing a right ABI of 0.99 with a left of 0.53  with what appears to be an occluded left common iliac artery.  He wishes to proceed with angiography and potential endovascular therapy for lifestyle limiting claudication.  He will need to stop his Eliquis 2 days prior to the procedure.  I reiterated the importance of smoking cessation for maintaining patency of any stent.      03/08/2020 MD FACP,FACC,FAHA, [facility] 03/15/2020 10:37 AM

## 2020-03-17 ENCOUNTER — Other Ambulatory Visit: Payer: Self-pay | Admitting: Cardiovascular Disease

## 2020-03-17 DIAGNOSIS — I739 Peripheral vascular disease, unspecified: Secondary | ICD-10-CM

## 2020-03-20 ENCOUNTER — Telehealth: Payer: Self-pay | Admitting: Cardiovascular Disease

## 2020-03-20 NOTE — Telephone Encounter (Signed)
Left message for patient to discuss 03/24/20 COVID screening. 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 Contacted [facility] testing location and patient can drive in to their test site and walk-up for testing.   Have also sent staff message to Janne Napoleon RN to see if rapid COVID screening could be done day of procedure 8/16 - response pending  Will need to reschedule patient for [facility] site.

## 2020-03-20 NOTE — Telephone Encounter (Signed)
Patient can do rapid COVID screening day of procedure per Janne Napoleon RN  Tried to call patient but no answer MyChart message sent

## 2020-03-20 NOTE — Telephone Encounter (Signed)
Patient is returning call.  °

## 2020-03-20 NOTE — Telephone Encounter (Signed)
Spoke with patient who would like to do COVID screening at [facility] due to accessibility. He cannot do same-day COVID screening as he lives 3 hours away already and would need to then arrived at 630am to complete this prior.   Will get pre-procedure testing scheduled and contact patient via MyChart

## 2020-03-21 NOTE — Telephone Encounter (Signed)
COVID screening moved from [place] to [facility]. Patient aware via MyChart message

## 2020-03-23 ENCOUNTER — Telehealth: Payer: Self-pay | Admitting: *Deleted

## 2020-03-23 NOTE — Telephone Encounter (Signed)
Patient is calling to follow up regarding monitor and Abdominal Aortogram With Lower Extremity . He states he has concerns regarding whether or not he needs to wait until after procedure scheduled for 03/27/20 with Dr. Allyson Sabal to apply monitor. In addition he would like to know if he needs to bring his CPAP machine in during procedure. He states he is about to go to sleep and if he is not available when call is returned, a detailed voice message will be fine. Please advise.

## 2020-03-23 NOTE — Telephone Encounter (Addendum)
Pt contacted pre-abdominal aortogram scheduled at [facility] for: Monday March 27, 2020 9:30 AM Verified arrival time and place: [facility]) at: 7:30 AM   No solid food after midnight prior to cath, clear liquids until 5 AM day of procedure.  Hold: Eliquis-none 03/25/20 until post procedure Farxiga-AM of procedure Glipizide-AM of procedure Metformin-day of procedure and 48 hours post procedure Lasix-AM of procedure. Viagra-72 hours pre-procedure  Except hold medications AM meds can be  taken pre-cath with sips of water including: ASA 81 mg Plavix 75 mg  Confirmed patient has responsible adult to drive home post procedure and observe 24 hours after arriving home: yes  You are allowed ONE visitor in the waiting room during your procedure. Both you and your visitor must wear a mask once you enter the hospital.      COVID-19 Pre-Screening Questions:  . In the past 14 days have you had a new cough associated with shortness of breath, unexplained body aches, fever (100.4 or greater) or sudden loss of taste or sense of smell? no . In the past 14 days have you been around anyone with known Covid 19? no . Have you been vaccinated for COVID-19? No-pt states he has not had any immunization for COVID-19       Reviewed procedure/mask/visitor instructions, COVID-19 questions with patient

## 2020-03-23 NOTE — Telephone Encounter (Signed)
Spoke to patient-advised ok to wait until after procedure to place monitor and no need to take CPAP machine.

## 2020-03-24 ENCOUNTER — Other Ambulatory Visit (HOSPITAL_COMMUNITY): Payer: 59

## 2020-03-24 ENCOUNTER — Other Ambulatory Visit
Admission: RE | Admit: 2020-03-24 | Discharge: 2020-03-24 | Disposition: A | Discharge status: Home / Self Care | Payer: 59 | Source: Ambulatory Visit | Attending: Cardiovascular Disease | Admitting: Cardiovascular Disease

## 2020-03-24 ENCOUNTER — Other Ambulatory Visit: Payer: Self-pay

## 2020-03-24 ENCOUNTER — Telehealth: Payer: Self-pay | Admitting: Physician Assistant

## 2020-03-24 DIAGNOSIS — Z20822 Contact with and (suspected) exposure to covid-19: Secondary | ICD-10-CM | POA: Insufficient documentation

## 2020-03-24 DIAGNOSIS — Z01812 Encounter for preprocedural laboratory examination: Secondary | ICD-10-CM | POA: Diagnosis present

## 2020-03-24 NOTE — Telephone Encounter (Signed)
Patient's wife is calling about the even monitor he is supposed to be wearing.

## 2020-03-25 LAB — SARS CORONAVIRUS 2 (TAT 6-24 HRS): SARS Coronavirus 2: NEGATIVE

## 2020-03-27 ENCOUNTER — Encounter (HOSPITAL_COMMUNITY)
Admission: RE | Disposition: A | Discharge status: Home / Self Care | Payer: Self-pay | Source: Home / Self Care | Attending: Cardiovascular Disease

## 2020-03-27 ENCOUNTER — Other Ambulatory Visit: Payer: Self-pay

## 2020-03-27 ENCOUNTER — Ambulatory Visit (HOSPITAL_COMMUNITY)
Admission: RE | Admit: 2020-03-27 | Discharge: 2020-03-28 | Disposition: A | Discharge status: Home / Self Care | Payer: 59 | Attending: Cardiovascular Disease | Admitting: Cardiovascular Disease

## 2020-03-27 DIAGNOSIS — E785 Hyperlipidemia, unspecified: Secondary | ICD-10-CM | POA: Diagnosis not present

## 2020-03-27 DIAGNOSIS — Z79899 Other long term (current) drug therapy: Secondary | ICD-10-CM | POA: Diagnosis not present

## 2020-03-27 DIAGNOSIS — E669 Obesity, unspecified: Secondary | ICD-10-CM | POA: Insufficient documentation

## 2020-03-27 DIAGNOSIS — I2583 Coronary atherosclerosis due to lipid rich plaque: Secondary | ICD-10-CM | POA: Diagnosis not present

## 2020-03-27 DIAGNOSIS — I739 Peripheral vascular disease, unspecified: Secondary | ICD-10-CM | POA: Diagnosis present

## 2020-03-27 DIAGNOSIS — E118 Type 2 diabetes mellitus with unspecified complications: Secondary | ICD-10-CM | POA: Diagnosis present

## 2020-03-27 DIAGNOSIS — I252 Old myocardial infarction: Secondary | ICD-10-CM | POA: Insufficient documentation

## 2020-03-27 DIAGNOSIS — Z7902 Long term (current) use of antithrombotics/antiplatelets: Secondary | ICD-10-CM | POA: Insufficient documentation

## 2020-03-27 DIAGNOSIS — I480 Paroxysmal atrial fibrillation: Secondary | ICD-10-CM | POA: Diagnosis present

## 2020-03-27 DIAGNOSIS — Z6838 Body mass index (BMI) 38.0-38.9, adult: Secondary | ICD-10-CM | POA: Diagnosis not present

## 2020-03-27 DIAGNOSIS — I70212 Atherosclerosis of native arteries of extremities with intermittent claudication, left leg: Secondary | ICD-10-CM | POA: Diagnosis not present

## 2020-03-27 DIAGNOSIS — I10 Essential (primary) hypertension: Secondary | ICD-10-CM | POA: Diagnosis present

## 2020-03-27 DIAGNOSIS — Z7901 Long term (current) use of anticoagulants: Secondary | ICD-10-CM | POA: Diagnosis not present

## 2020-03-27 DIAGNOSIS — I2510 Atherosclerotic heart disease of native coronary artery without angina pectoris: Secondary | ICD-10-CM | POA: Diagnosis not present

## 2020-03-27 DIAGNOSIS — Z7984 Long term (current) use of oral hypoglycemic drugs: Secondary | ICD-10-CM | POA: Insufficient documentation

## 2020-03-27 DIAGNOSIS — E1151 Type 2 diabetes mellitus with diabetic peripheral angiopathy without gangrene: Secondary | ICD-10-CM | POA: Diagnosis not present

## 2020-03-27 DIAGNOSIS — Z955 Presence of coronary angioplasty implant and graft: Secondary | ICD-10-CM | POA: Diagnosis not present

## 2020-03-27 DIAGNOSIS — F17210 Nicotine dependence, cigarettes, uncomplicated: Secondary | ICD-10-CM | POA: Insufficient documentation

## 2020-03-27 HISTORY — DX: Peripheral vascular disease, unspecified: I73.9

## 2020-03-27 HISTORY — PX: PERIPHERAL VASCULAR INTERVENTION: CATH118257

## 2020-03-27 HISTORY — PX: ABDOMINAL AORTOGRAM W/LOWER EXTREMITY: CATH118223

## 2020-03-27 LAB — POCT ACTIVATED CLOTTING TIME
Activated Clotting Time: 356 seconds
Activated Clotting Time: 279 seconds
Activated Clotting Time: 224 seconds
Activated Clotting Time: 164 seconds

## 2020-03-27 LAB — GLUCOSE, CAPILLARY
Glucose-Capillary: 164 mg/dL — ABNORMAL HIGH (ref 70–99)
Glucose-Capillary: 147 mg/dL — ABNORMAL HIGH (ref 70–99)
Glucose-Capillary: 164 mg/dL — ABNORMAL HIGH (ref 70–99)
Glucose-Capillary: 125 mg/dL — ABNORMAL HIGH (ref 70–99)

## 2020-03-27 SURGERY — ABDOMINAL AORTOGRAM W/LOWER EXTREMITY
Anesthesia: LOCAL | Laterality: Bilateral

## 2020-03-27 MED ORDER — SODIUM CHLORIDE 0.9 % WEIGHT BASED INFUSION
3.0000 mL/kg/h | INTRAVENOUS | Status: DC
  Administered 2020-03-27: 3 mL/kg/h via INTRAVENOUS

## 2020-03-27 MED ORDER — HEPARIN (PORCINE) IN NACL 1000-0.9 UT/500ML-% IV SOLN
INTRAVENOUS | Status: AC
  Filled 2020-03-27: qty 1000

## 2020-03-27 MED ORDER — MORPHINE SULFATE (PF) 2 MG/ML IV SOLN: 2.0000 mg | INTRAVENOUS | Status: DC | PRN

## 2020-03-27 MED ORDER — FUROSEMIDE 40 MG PO TABS
40.0000 mg | ORAL_TABLET | Freq: Two times a day (BID) | ORAL | Status: DC
  Administered 2020-03-27 – 2020-03-28 (×2): 40 mg via ORAL
  Filled 2020-03-27 (×2): qty 1

## 2020-03-27 MED ORDER — FENTANYL CITRATE (PF) 100 MCG/2ML IJ SOLN
INTRAMUSCULAR | Status: AC
  Filled 2020-03-27: qty 2

## 2020-03-27 MED ORDER — ASPIRIN 81 MG PO CHEW: 81.0000 mg | CHEWABLE_TABLET | ORAL | Status: DC

## 2020-03-27 MED ORDER — HYDRALAZINE HCL 20 MG/ML IJ SOLN: 5.0000 mg | INTRAMUSCULAR | Status: DC | PRN

## 2020-03-27 MED ORDER — METOPROLOL TARTRATE 100 MG PO TABS
100.0000 mg | ORAL_TABLET | Freq: Two times a day (BID) | ORAL | Status: DC
  Administered 2020-03-27 – 2020-03-28 (×2): 100 mg via ORAL
  Filled 2020-03-27 (×2): qty 1

## 2020-03-27 MED ORDER — IODIXANOL 320 MG/ML IV SOLN
INTRAVENOUS | Status: DC | PRN
  Administered 2020-03-27: 200 mL via INTRA_ARTERIAL

## 2020-03-27 MED ORDER — ACETAMINOPHEN 325 MG PO TABS: 650.0000 mg | ORAL_TABLET | ORAL | Status: DC | PRN

## 2020-03-27 MED ORDER — LABETALOL HCL 5 MG/ML IV SOLN: 10.0000 mg | INTRAVENOUS | Status: DC | PRN

## 2020-03-27 MED ORDER — HEPARIN (PORCINE) IN NACL 1000-0.9 UT/500ML-% IV SOLN
INTRAVENOUS | Status: DC | PRN
  Administered 2020-03-27 (×2): 500 mL

## 2020-03-27 MED ORDER — CLOPIDOGREL BISULFATE 75 MG PO TABS
75.0000 mg | ORAL_TABLET | Freq: Every day | ORAL | Status: DC
  Administered 2020-03-28: 75 mg via ORAL
  Filled 2020-03-27: qty 1

## 2020-03-27 MED ORDER — LIDOCAINE HCL (PF) 1 % IJ SOLN
INTRAMUSCULAR | Status: DC | PRN
  Administered 2020-03-27 (×2): 20 mL via SUBCUTANEOUS

## 2020-03-27 MED ORDER — SODIUM CHLORIDE 0.9 % IV SOLN: 250.0000 mL | INTRAVENOUS | Status: DC | PRN

## 2020-03-27 MED ORDER — CLOPIDOGREL BISULFATE 75 MG PO TABS: 75.0000 mg | ORAL_TABLET | Freq: Every day | ORAL | Status: DC

## 2020-03-27 MED ORDER — FENTANYL CITRATE (PF) 100 MCG/2ML IJ SOLN
INTRAMUSCULAR | Status: DC | PRN
  Administered 2020-03-27: 25 ug via INTRAVENOUS

## 2020-03-27 MED ORDER — DAPAGLIFLOZIN PROPANEDIOL 5 MG PO TABS
5.0000 mg | ORAL_TABLET | Freq: Every day | ORAL | Status: DC
  Administered 2020-03-27 – 2020-03-28 (×2): 5 mg via ORAL
  Filled 2020-03-27 (×2): qty 1

## 2020-03-27 MED ORDER — ALBUTEROL SULFATE HFA 108 (90 BASE) MCG/ACT IN AERS: 1.0000 | INHALATION_SPRAY | Freq: Four times a day (QID) | RESPIRATORY_TRACT | Status: DC | PRN

## 2020-03-27 MED ORDER — SODIUM CHLORIDE 0.9 % WEIGHT BASED INFUSION
1.0000 mL/kg/h | INTRAVENOUS | Status: DC
  Administered 2020-03-27: 1 mL/kg/h via INTRAVENOUS

## 2020-03-27 MED ORDER — SODIUM CHLORIDE 0.9 % IV SOLN: INTRAVENOUS | Status: AC

## 2020-03-27 MED ORDER — ASPIRIN EC 81 MG PO TBEC
81.0000 mg | DELAYED_RELEASE_TABLET | Freq: Every day | ORAL | Status: DC
  Administered 2020-03-28: 81 mg via ORAL
  Filled 2020-03-27: qty 1

## 2020-03-27 MED ORDER — SODIUM CHLORIDE 0.9% FLUSH: 3.0000 mL | Freq: Two times a day (BID) | INTRAVENOUS | Status: DC

## 2020-03-27 MED ORDER — MIDAZOLAM HCL 2 MG/2ML IJ SOLN
INTRAMUSCULAR | Status: DC | PRN
  Administered 2020-03-27: 1 mg via INTRAVENOUS

## 2020-03-27 MED ORDER — SODIUM CHLORIDE 0.9% FLUSH: 3.0000 mL | INTRAVENOUS | Status: DC | PRN

## 2020-03-27 MED ORDER — ATORVASTATIN CALCIUM 80 MG PO TABS
80.0000 mg | ORAL_TABLET | Freq: Every day | ORAL | Status: DC
  Administered 2020-03-27: 80 mg via ORAL
  Filled 2020-03-27: qty 1

## 2020-03-27 MED ORDER — GLIPIZIDE 10 MG PO TABS
10.0000 mg | ORAL_TABLET | Freq: Two times a day (BID) | ORAL | Status: DC
  Administered 2020-03-27 – 2020-03-28 (×2): 10 mg via ORAL
  Filled 2020-03-27 (×2): qty 1

## 2020-03-27 MED ORDER — LIDOCAINE HCL (PF) 1 % IJ SOLN
INTRAMUSCULAR | Status: AC
  Filled 2020-03-27: qty 30

## 2020-03-27 MED ORDER — ONDANSETRON HCL 4 MG/2ML IJ SOLN: 4.0000 mg | Freq: Four times a day (QID) | INTRAMUSCULAR | Status: DC | PRN

## 2020-03-27 MED ORDER — LISINOPRIL 2.5 MG PO TABS
2.5000 mg | ORAL_TABLET | Freq: Every day | ORAL | Status: DC
  Administered 2020-03-28: 2.5 mg via ORAL
  Filled 2020-03-27: qty 1

## 2020-03-27 MED ORDER — ALBUTEROL SULFATE (2.5 MG/3ML) 0.083% IN NEBU: 2.5000 mg | INHALATION_SOLUTION | Freq: Four times a day (QID) | RESPIRATORY_TRACT | Status: DC | PRN

## 2020-03-27 MED ORDER — HEPARIN SODIUM (PORCINE) 1000 UNIT/ML IJ SOLN
INTRAMUSCULAR | Status: DC | PRN
  Administered 2020-03-27: 12000 [IU] via INTRAVENOUS

## 2020-03-27 MED ORDER — HEPARIN SODIUM (PORCINE) 1000 UNIT/ML IJ SOLN
INTRAMUSCULAR | Status: AC
  Filled 2020-03-27: qty 2

## 2020-03-27 MED ORDER — MIDAZOLAM HCL 2 MG/2ML IJ SOLN
INTRAMUSCULAR | Status: AC
  Filled 2020-03-27: qty 2

## 2020-03-27 SURGICAL SUPPLY — 22 items
BALLN MUSTANG 5.0X40 75 (BALLOONS) ×3
BALLN MUSTANG 9X20X75 (BALLOONS) ×3
CATH ANGIO 5F PIGTAIL 65CM (CATHETERS) ×3
CATHETER NAVICROSS ST 65CM (CATHETERS) ×3
GLIDEWIRE ANGLED SS 035X260CM (WIRE) ×3
KIT ENCORE 26 ADVANTAGE (KITS) ×3
KIT PV (KITS) ×3
SHEATH BRITE TIP 6FR 35CM (SHEATH) ×3
SHEATH BRITE TIP 7FR 35CM (SHEATH) ×3
SHEATH PINNACLE 5F 10CM (SHEATH) ×3
SHEATH PINNACLE 7F 10CM (SHEATH) ×3
SHEATH PROBE COVER 6X72 (BAG) ×3
STENT VIABAHN 8X29X80 VBX (Permanent Stent) ×3 IMPLANT
STOPCOCK MORSE 400PSI 3WAY (MISCELLANEOUS) ×6
SYR MEDRAD MARK 7 150ML (SYRINGE) ×3
TAPE VIPERTRACK RADIOPAQ (MISCELLANEOUS) ×2
TAPE VIPERTRACK RADIOPAQUE (MISCELLANEOUS) ×1
TRANSDUCER W/STOPCOCK (MISCELLANEOUS) ×3
TRAY PV CATH (CUSTOM PROCEDURE TRAY) ×3
TUBING CIL FLEX 10 FLL-RA (TUBING) ×3
WIRE HITORQ VERSACORE ST 145CM (WIRE) ×3
WIRE VERSACORE LOC 115CM (WIRE) ×3

## 2020-03-27 NOTE — Interval H&P Note (Signed)
History and Physical Interval Note:  03/27/2020 9:22 AM  Ian [name]  has presented today for surgery, with the diagnosis of PAD.  The various methods of treatment have been discussed with the patient and family. After consideration of risks, benefits and other options for treatment, the patient has consented to  Procedure(s): ABDOMINAL AORTOGRAM W/LOWER EXTREMITY (Bilateral) as a surgical intervention.  The patient's history has been reviewed, patient examined, no change in status, stable for surgery.  I have reviewed the patient's chart and labs.  Questions were answered to the patient's satisfaction.     Nanetta Batty

## 2020-03-27 NOTE — Progress Notes (Signed)
Patient given turkey sandwich meal.

## 2020-03-27 NOTE — Progress Notes (Signed)
Site area:Left femoral   Site Prior to Removal: level 0  Pressure Applied For:  20 mins  Minutes Beginning at:  1323  Manual:Yes  Patient Status During Pull: without complaints, A/O, resting comfortably  Post Pull Groin Site: level 0  Post Pull Instructions Given:  yes, verbalized and demonstrated understanding  Post Pull Pulses Present:  left DP  Dressing Applied:  Gauze, tegaderm  Bedrest started at:   1345  (6 hrs per order)

## 2020-03-27 NOTE — Progress Notes (Signed)
Site area: rt groin fa sheath Site Prior to Removal:  Level 0 Pressure Applied For: 20 minutes Manual:   yes Patient Status During Pull:  stable Post Pull Site:  Level 0 Post Pull Instructions Given:  yes Post Pull Pulses Present: rt dp palpable Dressing Applied:  Gauze and tegaderm Bedrest begins @  Comments:   

## 2020-03-28 ENCOUNTER — Telehealth: Payer: Self-pay | Admitting: Cardiovascular Disease

## 2020-03-28 ENCOUNTER — Encounter (HOSPITAL_COMMUNITY): Payer: Self-pay | Admitting: Cardiovascular Disease

## 2020-03-28 DIAGNOSIS — E1151 Type 2 diabetes mellitus with diabetic peripheral angiopathy without gangrene: Secondary | ICD-10-CM | POA: Diagnosis not present

## 2020-03-28 DIAGNOSIS — I739 Peripheral vascular disease, unspecified: Secondary | ICD-10-CM | POA: Diagnosis not present

## 2020-03-28 LAB — CBC
HCT: 51.5 % (ref 39.0–52.0)
Hemoglobin: 17.4 g/dL — ABNORMAL HIGH (ref 13.0–17.0)
MCH: 32.9 pg (ref 26.0–34.0)
MCHC: 33.8 g/dL (ref 30.0–36.0)
MCV: 97.4 fL (ref 80.0–100.0)
Platelets: 128 10*3/uL — ABNORMAL LOW (ref 150–400)
RBC: 5.29 MIL/uL (ref 4.22–5.81)
RDW: 12.2 % (ref 11.5–15.5)
WBC: 7.7 10*3/uL (ref 4.0–10.5)
nRBC: 0 % (ref 0.0–0.2)

## 2020-03-28 LAB — BASIC METABOLIC PANEL
Anion gap: 10 (ref 5–15)
BUN: 12 mg/dL (ref 6–20)
CO2: 27 mmol/L (ref 22–32)
Calcium: 9.2 mg/dL (ref 8.9–10.3)
Chloride: 102 mmol/L (ref 98–111)
Creatinine, Ser: 1.04 mg/dL (ref 0.61–1.24)
GFR calc Af Amer: >60 mL/min (ref >60)
GFR calc non Af Amer: >60 mL/min (ref >60)
Glucose, Bld: 105 mg/dL — ABNORMAL HIGH (ref 70–99)
Potassium: 4.3 mmol/L (ref 3.5–5.1)
Sodium: 139 mmol/L (ref 135–145)

## 2020-03-28 LAB — GLUCOSE, CAPILLARY
Glucose-Capillary: 139 mg/dL — ABNORMAL HIGH (ref 70–99)
Glucose-Capillary: 155 mg/dL — ABNORMAL HIGH (ref 70–99)

## 2020-03-28 MED ORDER — PANTOPRAZOLE SODIUM 40 MG PO TBEC
40.0000 mg | DELAYED_RELEASE_TABLET | Freq: Every day | ORAL | Status: DC
  Administered 2020-03-28: 40 mg via ORAL
  Filled 2020-03-28: qty 1

## 2020-03-28 MED ORDER — APIXABAN 5 MG PO TABS
5.0000 mg | ORAL_TABLET | Freq: Two times a day (BID) | ORAL | Status: DC
  Administered 2020-03-28: 5 mg via ORAL
  Filled 2020-03-28: qty 1

## 2020-03-28 MED ORDER — ASPIRIN 81 MG PO TBEC: 81.0000 mg | DELAYED_RELEASE_TABLET | Freq: Every day | ORAL | 0 refills | Status: DC

## 2020-03-28 MED ORDER — PANTOPRAZOLE SODIUM 40 MG PO TBEC: 40.0000 mg | DELAYED_RELEASE_TABLET | Freq: Every day | ORAL | 0 refills | Status: DC

## 2020-03-28 NOTE — Progress Notes (Signed)
D/c tele and IV. Went over AVS with pt and his wife and all questions were answered.   Dava Rensch S Nicholad Kautzman, RN  

## 2020-03-28 NOTE — Telephone Encounter (Signed)
Spoke with pt, aware dr berry said 3 days.

## 2020-03-28 NOTE — Telephone Encounter (Signed)
    Pt would like to ask when he can take a shower. He had his procedure yesterday

## 2020-03-28 NOTE — Discharge Summary (Addendum)
Discharge Summary    Patient ID: Ian [name] MRN: [id number]; DOB: 1970/11/01  Admit date: 03/27/2020 Discharge date: 03/28/2020  Primary Care Provider: Patient, No Pcp Per  Primary Cardiologist: Ena Dawley, MD  / PV - Dr. Gwenlyn Found Primary Electrophysiologist:  None   Discharge Diagnoses    Principal Problem:   Claudication in peripheral vascular disease [facility]) Active Problems:   Hyperlipidemia   Type 2 diabetes mellitus with complication, without long-term current use of insulin (HCC)   PAF (paroxysmal atrial fibrillation) ([place])   Essential hypertension   Coronary artery disease due to lipid rich plaque   Peripheral arterial disease ([place])   Diagnostic Studies/Procedures    PV Angio 03/27/20 PV Angiogram/Intervention  History obtained from chart review.Ian [name] a 49 y.o.moderately overweight married Caucasian male father of 3 children, grandfather of 3 grandchildren accompanied by his wife Ian [name] today. He was referred by Dr. [name], his cardiologist, for peripheral vascular evaluation because of lifestyle imaging claudication. He works as a Programmer, systems. His risk factors include 60-pack-year tobacco abuse continue to smoke 1-1/2 packs a day recalcitrant to risk factor modification. He has treated diabetes and hyperlipidemia. He also has PAF on Eliquis. His family history is remarkable for father who apparently had CAD. He has had multiple coronary interventions dating back to 2016 when he had RCA stent. He had orbital atherectomy his RCA restenting 09/05/2016. He has had claudication for 4 years primarily on the left side with recent Doppler studies performed 03/06/2020 revealing a right ABI that was normal and a left of 0.53 with an occluded left common iliac artery.  He presents today for outpatient elective peripheral angiography potential endovascular therapy for left lower extremity lifestyle limiting claudication.  Pre Procedure Diagnosis:  Peripheral arterial disease  Post Procedure Diagnosis: Peripheral arterial disease  Operators: Dr. Quay Burow  Procedures Performed:               1.  Ultrasound-guided right and left common femoral access               2.  Abdominal aortogram/bilateral iliac angiogram/bifemoral runoff               3.  PTA/VBX covered stenting left common iliac artery CTO                 PROCEDURE DESCRIPTION:   The patient was brought to the second floor [facility] cath lab in the the postabsorptive state. He was premedicated with IV Versed and fentanyl. His right and left groins were prepped and shaved in usual sterile fashion. Xylocaine 1% was used for local anesthesia. A 5 French sheath was inserted into the right common femoral artery using standard Seldinger technique.  Ultrasound was used to guide access in both the right and left common femoral arteries.  Direct visualization of the artery was obtained and a digital image was saved in patient's chart.  The pigtail catheters placed the distal abdominal aorta.  Distal abdominal aortography, bilateral iliac angiography with bifemoral runoff was performed using bolus chase, digital subtraction and step table technique.  Omnipaque dye was used for the entirety of the case.  Retrograde aortic pressures monitored during the case.  Angiographic Data:   1: Abdominal aorta-widely patent and minimally atherosclerotic at the level of the iliac bifurcation 2: Right lower extremity-essentially normal with three-vessel runoff 3: Left lower extremity-short segment CTO proximal left common iliac artery stenosis, 75% calcified exophytic plaque in the left common femoral artery with otherwise normal  SFA, popliteal and three-vessel runoff  IMPRESSION: Ian [name] has short segment CTO proximal left common iliac artery as well as moderate disease in his left common femoral artery.  My plan is to perform PTA and covered stenting of his left common iliac  artery CTO.  Procedure Description: The left common iliac artery was accessed with a 6 French sheath demonstrating flow beyond the sheath followed by a 7 French right hip.  Patient received a total of 12,000 units of heparin with an ACT of 279 at the end.  Using an 035 90 cm long straight Navicross endhole catheter along with a stiff angled Glidewire I was able to cross the CTO with minimal difficulty.  Once demonstrating that was intraluminal within the aorta I exchanged for an 035 versa core wire.  I then predilated with a 5 mm x 4 cm long balloon and carefully placed an 8 mm x 29 mm long VBX covered stent at the ostium of the left common iliac artery artery just above the takeoff of the hypogastric encompassing the CTO.  I deployed at 10 atm.  I then postdilated with a 9 mm x 2 cm noncompliant balloon up to 10 atm resulting reduction of a short segment proximal left common iliac artery CTO to 0% residual with excellent flow.  The patient has residual 75% calcified left common femoral artery stenosis which will need to watch carefully although I suspect his symptoms will resolve.  Final Impression: Successful left common iliac artery CTO intervention with a VBX covered stent for lifestyle of any claudication.  Patient was already on Plavix and Eliquis.  Held his Eliquis 3 days prior to the procedure.  My plan is to begin him on "triple therapy" with low-dose aspirin, Plavix and Eliquis for 30 days followed by Plavix and Eliquis which she was on prior to the procedure.  The Eliquis can be restarted tomorrow.  The sheaths will be removed pressure held once ACT is demonstrated less than 170.  He will be hydrated overnight and discharged home in the morning.  We will get lower extremity arterial Doppler studies in our [facility] line office next week and I will see him back the week after for follow-up.  He left the lab in stable condition.   Quay Burow. MD, [facility] 03/27/2020 10:59 AM   _____________     History of Present Illness     Ian [name] is a 49 y.o. male with CAD s/p NSTEMI 2016 with DES to the RCA, orbital atherectomy/DES to ISR of RCA in 2018, residual moderate LCx disease, mild LV dysfunction EF 45-50%, persistent atrial fibrillation, HTN, DM2, HLD, tobacco abuse, obesity. Recently seen in the office in June 2021 for dyspnea, elevated HR and claudication. Lexiscan nuc was without ischemia and 2DE cho showed EF 50-55% slightly improved from prior. EKG in June showed recurrent atrial fibrillation. At f/u 03/15/20, outpatient Zio planned to assess burden (previously 80% on a prior monitor). He has had claudication for 4 years primarily on the left side with recent Doppler studies performed 03/06/2020 revealing a right ABI that was normal and a left of 0.53 with an occluded left common iliac artery.He was admitted for planned PV angio.   Hospital Course     He underwent procedure above with successful left common iliac artery CTO intervention with a VBX covered stent for lifestyle of any claudication. Dr. Gwenlyn Found recommends to begin him on "triple therapy" with low-dose aspirin, Plavix and Eliquis for 30 days followed by Plavix and  Eliquis. He was cleared to restart Eliquis today. He will also be placed on PPI during this 30 days. Bleeding precautions reviewed. The patient indicates Dr. 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 Gwenlyn Found in follow-up for when he may return. Dr. Kennon Holter team has arranged f/u duplex on 8/23 and OV on 9/13. The patient feels well today and tolerated procedure without complication. Dr. Gwenlyn Found has seen and examined the patient today and feels he is stable for discharge. He was instructed to hold his metformin for at least 48 hours post cath, to resume the morning of 03/30/20. Dr. Burt Knack has seen and examined the patient today and feels he is stable for discharge.   Did the patient have an acute coronary syndrome (MI,  NSTEMI, STEMI, etc) this admission?:  No                               Did the patient have a percutaneous coronary intervention (stent / angioplasty)?:  No.   _____________  Discharge Vitals Vital Signs. BP (!) 136/98 (BP Location: Left Arm)    Pulse 65    Temp 98.4 F (36.9 C) (Oral)    Resp 17    Ht 5' 11"  (1.803 m)    Wt 124.1 kg    SpO2 93%    BMI 38.17 kg/m  General: Well developed, well nourished WM, in no acute distress Head: Normocephalic, atraumatic, sclera non-icteric, no xanthomas, nares are without discharge. Neck: Negative for carotid bruits. JVP not elevated. Lungs: Clear bilaterally to auscultation without wheezes, rales, or rhonchi. Breathing is unlabored. Heart: Irregularly irregular, S1 S2 without murmurs, rubs, or gallops. (Rate controlled afib on tele, similar to 01/2020 EKG.) Abdomen: Soft, non-tender, non-distended with normoactive bowel sounds. No rebound/guarding. Extremities: No clubbing or cyanosis. No edema. Distal pedal pulses are in tact and equal bilaterally. Bilateral groin sites with mild ecchymosis but no bruits or residual bleeding Neuro: Alert and oriented X 3. Moves all extremities spontaneously. Psych:  Responds to questions appropriately with a normal affect.  Labs & Radiologic Studies    CBC Recent Labs    03/28/20 0247  WBC 7.7  HGB 17.4*  HCT 51.5  MCV 97.4  PLT 124*   Basic Metabolic Panel Recent Labs    03/28/20 0247  NA 139  K 4.3  CL 102  CO2 27  GLUCOSE 105*  BUN 12  CREATININE 1.04  CALCIUM 9.2    PERIPHERAL VASCULAR CATHETERIZATION  Result Date: 03/27/2020  [id number] LOCATION:  FACILITY: [facility] PHYSICIAN: Quay Burow, M.D. Jul 24, 1971 DATE OF PROCEDURE:  03/27/2020 DATE OF DISCHARGE: PV Angiogram/Intervention History obtained from chart review.Ian [name] is a 49 y.o. moderately overweight married Caucasian male father of 3 children, grandfather of 3 grandchildren accompanied by his wife Ian [name] today.  He was referred by Dr.  [name], his cardiologist, for peripheral vascular evaluation because of lifestyle imaging claudication.  He works as a Programmer, systems.  His risk factors include 60-pack-year tobacco abuse continue to smoke 1-1/2 packs a day recalcitrant to risk factor modification.  He has treated diabetes and hyperlipidemia.  He also has PAF on Eliquis.  His family history is remarkable for father who apparently had CAD.  He has had multiple coronary interventions dating back to 2016 when he had RCA stent.  He had orbital atherectomy his RCA restenting 09/05/2016.  He has had claudication for 4  years primarily on the left side with recent Doppler studies performed 03/06/2020 revealing a right ABI that was normal and a left of 0.53 with an occluded left common iliac artery.  He presents today for outpatient elective peripheral angiography potential endovascular therapy for left lower extremity lifestyle limiting claudication. Pre Procedure Diagnosis: Peripheral arterial disease Post Procedure Diagnosis: Peripheral arterial disease Operators: Dr. Quay Burow Procedures Performed:  1.  Ultrasound-guided right and left common femoral access  2.  Abdominal aortogram/bilateral iliac angiogram/bifemoral runoff  3.  PTA/VBX covered stenting left common iliac artery CTO  PROCEDURE DESCRIPTION: The patient was brought to the second floor [facility] cath lab in the the postabsorptive state. He was premedicated with IV Versed and fentanyl. His right and left groins were prepped and shaved in usual sterile fashion. Xylocaine 1% was used for local anesthesia. A 5 French sheath was inserted into the right common femoral artery using standard Seldinger technique.  Ultrasound was used to guide access in both the right and left common femoral arteries.  Direct visualization of the artery was obtained and a digital image was saved in patient's chart.  The pigtail catheters placed the distal abdominal aorta.  Distal abdominal  aortography, bilateral iliac angiography with bifemoral runoff was performed using bolus chase, digital subtraction and step table technique.  Omnipaque dye was used for the entirety of the case.  Retrograde aortic pressures monitored during the case. Angiographic Data: 1: Abdominal aorta-widely patent and minimally atherosclerotic at the level of the iliac bifurcation 2: Right lower extremity-essentially normal with three-vessel runoff 3: Left lower extremity-short segment CTO proximal left common iliac artery stenosis, 75% calcified exophytic plaque in the left common femoral artery with otherwise normal SFA, popliteal and three-vessel runoff   Ian [name] has short segment CTO proximal left common iliac artery as well as moderate disease in his left common femoral artery.  My plan is to perform PTA and covered stenting of his left common iliac artery CTO. Procedure Description: The left common iliac artery was accessed with a 6 French sheath demonstrating flow beyond the sheath followed by a 7 French right hip.  Patient received a total of 12,000 units of heparin with an ACT of 279 at the end.  Using an 035 90 cm long straight Navicross endhole catheter along with a stiff angled Glidewire I was able to cross the CTO with minimal difficulty.  Once demonstrating that was intraluminal within the aorta I exchanged for an 035 versa core wire.  I then predilated with a 5 mm x 4 cm long balloon and carefully placed an 8 mm x 29 mm long VBX covered stent at the ostium of the left common iliac artery artery just above the takeoff of the hypogastric encompassing the CTO.  I deployed at 10 atm.  I then postdilated with a 9 mm x 2 cm noncompliant balloon up to 10 atm resulting reduction of a short segment proximal left common iliac artery CTO to 0% residual with excellent flow.  The patient has residual 75% calcified left common femoral artery stenosis which will need to watch carefully although I suspect his symptoms  will resolve. Final Impression: Successful left common iliac artery CTO intervention with a VBX covered stent for lifestyle of any claudication.  Patient was already on Plavix and Eliquis.  Held his Eliquis 3 days prior to the procedure.  My plan is to begin him on "triple therapy" with low-dose aspirin, Plavix and Eliquis for 30 days followed by Plavix  and Eliquis which she was on prior to the procedure.  The Eliquis can be restarted tomorrow.  The sheaths will be removed pressure held once ACT is demonstrated less than 170.  He will be hydrated overnight and discharged home in the morning.  We will get lower extremity arterial Doppler studies in our [facility] line office next week and I will see him back the week after for follow-up.  He left the lab in stable condition. Quay Burow. MD, [facility] 03/27/2020 10:59 AM   VAS [place] ABI WITH/WO TBI  Result Date: 03/09/2020 LOWER EXTREMITY DOPPLER STUDY Indications: History of PAD. Patient presents today with complaints of bilateral              claudication symptoms after walking a short distance x 1.5 years.              The pain is more on the left side and it starts at the groin and              goes down the leg. High Risk Factors: Hypertension, hyperlipidemia, Diabetes, current smoker,                    coronary artery disease.  Comparison Study: In 05/2018, an arterial Doppler showed an ABI of 1.11 on the                   right and .91 on the left. Performing Technologist: Sharlett Iles RVT  Examination Guidelines: A complete evaluation includes at minimum, Doppler waveform signals and systolic blood pressure reading at the level of bilateral brachial, anterior tibial, and posterior tibial arteries, when vessel segments are accessible. Bilateral testing is considered an integral part of a complete examination. Photoelectric Plethysmograph (PPG) waveforms and toe systolic pressure readings are included as required and additional duplex testing as needed. Limited  examinations for reoccurring indications may be performed as noted.  ABI Findings: +---------+------------------+-----+-----------+--------+  Right     Rt Pressure (mmHg) Index Waveform    Comment   +---------+------------------+-----+-----------+--------+  Brachial  118                                            +---------+------------------+-----+-----------+--------+  ATA       107                0.91  triphasic             +---------+------------------+-----+-----------+--------+  PTA       114                0.97  multiphasic           +---------+------------------+-----+-----------+--------+  PERO      117                0.99  multiphasic           +---------+------------------+-----+-----------+--------+  Great Toe 134                1.14  Normal                +---------+------------------+-----+-----------+--------+ +---------+------------------+-----+----------+---------+  Left      Lt Pressure (mmHg) Index Waveform   Comment    +---------+------------------+-----+----------+---------+  Brachial  112                                            +---------+------------------+-----+----------+---------+  ATA       59                 0.50  monophasic            +---------+------------------+-----+----------+---------+  PTA       63                 0.53  monophasic            +---------+------------------+-----+----------+---------+  PERO                               absent     inaudible  +---------+------------------+-----+----------+---------+  Great Toe 41                 0.35  Abnormal              +---------+------------------+-----+----------+---------+ +-------+-----------+-----------+------------+------------+  ABI/TBI Today's ABI Today's TBI Previous ABI Previous TBI  +-------+-----------+-----------+------------+------------+  Right   .99         1.14        1.11         .95           +-------+-----------+-----------+------------+------------+  Left    .53         .35         .91          .80            +-------+-----------+-----------+------------+------------+ Bilateral ABIs and left TBIs appear decreased compared to prior study on 05/15/2018. Left TBIs appear increased compared to prior study on 05/15/2018.  Summary: Right: Resting right ankle-brachial index is within normal range. No evidence of significant right lower extremity arterial disease. The right toe-brachial index is normal. ABIs decreased by .12 and TBIs increased by .19. Left: Resting left ankle-brachial index indicates moderate left lower extremity arterial disease. The left toe-brachial index is abnormal. ABIs decreased by .38 and TBIs decreased by .45.  *See table(s) above for measurements and observations. See LE Arterial duplex report. Vascular consult recommended. Electronically signed by Kathlyn Sacramento MD on 03/09/2020 at 7:46:56 AM.Scheduled to consult with Dr. Gwenlyn Found on March 15, 2020.    Final    MYOCARDIAL PERFUSION IMAGING  Result Date: 03/06/2020  Nuclear stress EF: 43%.  This is an intermediate risk study.  The left ventricular ejection fraction is moderately decreased (30-44%).  There was no ST segment deviation noted during stress.  Normal resting and stress perfusion. No ischemia or infarction EF LVE with global hypokinesis EF 43% Intermediate risk based on EF   ECHOCARDIOGRAM COMPLETE  Result Date: 03/06/2020    ECHOCARDIOGRAM REPORT   Patient Name:   Ian [name] Date of Exam: 03/06/2020 Medical Rec #:  [id number]      Height:       71.0 in Accession #:    [id number]     Weight:       272.0 lb Date of Birth:  [birth year]      BSA:          2.403 m Patient Age:    89 years       BP:           124/76 mmHg Patient Gender: M              HR:           74 bpm. Exam Location:  [place] Procedure: 2D Echo, Cardiac Doppler, Color Doppler and Intracardiac  Opacification Agent Indications:    I25.1 CAD  History:        Patient has prior history of Echocardiogram examinations, most                 recent 08/30/2016.  Cardiomyopathy, CAD, Arrythmias:Atrial                 Fibrillation, Signs/Symptoms:Shortness of Breath; Risk                 Factors:Hypertension, Diabetes, Dyslipidemia, Current Smoker and                 Obesity.  Sonographer:    Jessee Avers, RDCS Referring Phys: [id number] [place]  1. Poor acoustic windows Basal inferior/inferoseptal hypokinesis . Left ventricular ejection fraction, by estimation, is 50 to 55%). Left ventricular diastolic parameters are indeterminate.  2. Right ventricular systolic function is mildly reduced. The right ventricular size is mildly enlarged.  3. Right atrial size was mildly dilated.  4. The mitral valve is grossly normal. Mild mitral valve regurgitation.  5. The aortic valve is normal in structure. Aortic valve regurgitation is not visualized.  6. The inferior vena cava is normal in size with greater than 50% respiratory variability, suggesting right atrial pressure of 3 mmHg. Comparison(s): 08/30/16 EF 40-45%. FINDINGS  Left Ventricle: Poor acoustic windows Basal inferior/inferoseptal hypokinesis. Left ventricular ejection fraction, by estimation, is 50 to 55%. The left ventricle has low normal function. The left ventricle demonstrates regional wall motion abnormalities. The left ventricular internal cavity size was normal in size. There is no left ventricular hypertrophy. Left ventricular diastolic parameters are indeterminate. Right Ventricle: The right ventricular size is mildly enlarged. Right vetricular wall thickness was not assessed. Right ventricular systolic function is mildly reduced. Left Atrium: Left atrial size was normal in size. Right Atrium: Right atrial size was mildly dilated. Pericardium: There is no evidence of pericardial effusion. Mitral Valve: The mitral valve is grossly normal. Mild mitral annular calcification. Mild mitral valve regurgitation. Tricuspid Valve: The tricuspid valve is normal in structure. Tricuspid valve regurgitation is  trivial. Aortic Valve: The aortic valve is normal in structure. Aortic valve regurgitation is not visualized. Pulmonic Valve: The pulmonic valve was normal in structure. Pulmonic valve regurgitation is mild. Aorta: The aortic root is normal in size and structure. Venous: The inferior vena cava is normal in size with greater than 50% respiratory variability, suggesting right atrial pressure of 3 mmHg. IAS/Shunts: The interatrial septum was not assessed.  LEFT VENTRICLE PLAX 2D LVIDd:         5.10 cm LVIDs:         3.70 cm LV PW:         1.10 cm LV IVS:        1.10 cm LVOT diam:     2.40 cm LV SV:         86 LV SV Index:   36 LVOT Area:     4.52 cm  RIGHT VENTRICLE RV Basal diam:  4.10 cm RV S prime:     9.97 cm/s TAPSE (M-mode): 1.8 cm LEFT ATRIUM             Index       RIGHT ATRIUM           Index LA diam:        4.70 cm 1.96 cm/m  RA Pressure: 3.00 mmHg LA Vol (A2C):   59.1 ml 24.60 ml/m RA Area:  22.70 cm LA Vol (A4C):   46.1 ml 19.18 ml/m RA Volume:   70.50 ml  29.34 ml/m LA Biplane Vol: 52.5 ml 21.85 ml/m  AORTIC VALVE LVOT Vmax:   99.05 cm/s LVOT Vmean:  66.450 cm/s LVOT VTI:    0.190 m  AORTA Ao Root diam: 3.30 cm Ao Asc diam:  3.50 cm TRICUSPID VALVE Estimated RAP:  3.00 mmHg  SHUNTS Systemic VTI:  0.19 m Systemic Diam: 2.40 cm Dorris Carnes MD Electronically signed by Dorris Carnes MD Signature Date/Time: 03/06/2020/10:56:14 PM    Final    VAS [place] LOWER EXTREMITY ARTERIAL DUPLEX  Result Date: 03/09/2020 LOWER EXTREMITY ARTERIAL DUPLEX STUDY Indications: History of PAD. Patient presents today with complaints of bilateral              claudication symptoms after walking a short distance x 1.5 years.              The pain is more on the left side and it starts at the groin and              goes down the leg. High Risk Factors: Hypertension, hyperlipidemia, Diabetes, current smoker,                    coronary artery disease.  Current ABI: .99 on the right and .53 on the left Comparison Study: In 05/2018,  a lower arterial duplex showed no evidence of                   arterial stenosis or occlusive diseased in the right lower                   extremity and 30-49% stenosis in the left distal CFA, high end                   range. Performing Technologist: Sharlett Iles RVT  Examination Guidelines: A complete evaluation includes B-mode imaging, spectral Doppler, color Doppler, and power Doppler as needed of all accessible portions of each vessel. Bilateral testing is considered an integral part of a complete examination. Limited examinations for reoccurring indications may be performed as noted. Aorta: +--------+-------+----------+----------+--------+--------+---------------------+           AP (cm) Trans (cm) PSV (cm/s) Waveform Thrombus Shape                  +--------+-------+----------+----------+--------+--------+---------------------+  Proximal                                                 not visualized due to                                                            overlying bowel gas    +--------+-------+----------+----------+--------+--------+---------------------+  Mid                         77                                                  +--------+-------+----------+----------+--------+--------+---------------------+  Distal                      52                                                  +--------+-------+----------+----------+--------+--------+---------------------+   +-----------+--------+-----+---------------+----------------+------------------+  RIGHT       PSV cm/s Ratio Stenosis        Waveform         Comments            +-----------+--------+-----+---------------+----------------+------------------+  CIA Prox    142                            biphasic                             +-----------+--------+-----+---------------+----------------+------------------+  CIA Mid     204                            triphasic                             +-----------+--------+-----+---------------+----------------+------------------+  CIA Distal  232            50-74% stenosis triphasic        >50% stenosis, low                                                               end range           +-----------+--------+-----+---------------+----------------+------------------+  EIA Prox    111                            multiphasic                          +-----------+--------+-----+---------------+----------------+------------------+  EIA Mid     127                            triphasic                            +-----------+--------+-----+---------------+----------------+------------------+  EIA Distal  154            30-49% stenosis triphasic        <50% stenosis, low                                                               end range           +-----------+--------+-----+---------------+----------------+------------------+  CFA Prox    111  triphasic                            +-----------+--------+-----+---------------+----------------+------------------+  DFA         70                             barely triphasic                     +-----------+--------+-----+---------------+----------------+------------------+  SFA Prox    103                            triphasic                            +-----------+--------+-----+---------------+----------------+------------------+  SFA Mid     137                            triphasic                            +-----------+--------+-----+---------------+----------------+------------------+  SFA Distal  99                             triphasic                            +-----------+--------+-----+---------------+----------------+------------------+  POP Prox    70                             triphasic                            +-----------+--------+-----+---------------+----------------+------------------+  POP Mid     50                             triphasic                             +-----------+--------+-----+---------------+----------------+------------------+  POP Distal  70                             triphasic                            +-----------+--------+-----+---------------+----------------+------------------+  TP Trunk    74                             triphasic                            +-----------+--------+-----+---------------+----------------+------------------+  ATA Prox    36                             biphasic                             +-----------+--------+-----+---------------+----------------+------------------+  ATA Mid     69                             triphasic                            +-----------+--------+-----+---------------+----------------+------------------+  ATA Distal  86                             triphasic                            +-----------+--------+-----+---------------+----------------+------------------+  PTA Prox    54                             triphasic                            +-----------+--------+-----+---------------+----------------+------------------+  PTA Mid     75                             triphasic                            +-----------+--------+-----+---------------+----------------+------------------+  PTA Distal  181            30-49% stenosis triphasic                            +-----------+--------+-----+---------------+----------------+------------------+  PERO Prox   82                             triphasic                            +-----------+--------+-----+---------------+----------------+------------------+  PERO Mid    34                             biphasic                             +-----------+--------+-----+---------------+----------------+------------------+  PERO Distal 44                             biphasic                             +-----------+--------+-----+---------------+----------------+------------------+  +-----------+--------+-----+--------+----------+-------------------------------+   LEFT        PSV cm/s Ratio Stenosis Waveform   Comments                         +-----------+--------+-----+--------+----------+-------------------------------+  CIA Prox    0              occluded                                             +-----------+--------+-----+--------+----------+-------------------------------+  CIA Mid     36                      monophasic                                  +-----------+--------+-----+--------+----------+-------------------------------+  CIA Distal  41                      monophasic                                  +-----------+--------+-----+--------+----------+-------------------------------+  EIA Prox    45                      monophasic                                  +-----------+--------+-----+--------+----------+-------------------------------+  EIA Mid     61                      monophasic                                  +-----------+--------+-----+--------+----------+-------------------------------+  EIA Distal  43                      monophasic                                  +-----------+--------+-----+--------+----------+-------------------------------+  CFA Prox    91                      monophasic bulky calcified plaque                                                           throughout the CFA               +-----------+--------+-----+--------+----------+-------------------------------+  DFA         48                      monophasic                                  +-----------+--------+-----+--------+----------+-------------------------------+  SFA Prox    71                      monophasic                                  +-----------+--------+-----+--------+----------+-------------------------------+  SFA Mid     46                      monophasic                                  +-----------+--------+-----+--------+----------+-------------------------------+  SFA Distal  47                      monophasic                                   +-----------+--------+-----+--------+----------+-------------------------------+  POP Prox    30                      monophasic                                  +-----------+--------+-----+--------+----------+-------------------------------+  POP Mid     24                      monophasic                                  +-----------+--------+-----+--------+----------+-------------------------------+  POP Distal  23                      monophasic                                  +-----------+--------+-----+--------+----------+-------------------------------+  TP Trunk    30                      monophasic                                  +-----------+--------+-----+--------+----------+-------------------------------+  ATA Prox    19                      monophasic                                  +-----------+--------+-----+--------+----------+-------------------------------+  ATA Mid     31                      monophasic                                  +-----------+--------+-----+--------+----------+-------------------------------+  ATA Distal  11                      monophasic                                  +-----------+--------+-----+--------+----------+-------------------------------+  PTA Prox    27                      monophasic                                  +-----------+--------+-----+--------+----------+-------------------------------+  PTA Mid     26                      monophasic                                  +-----------+--------+-----+--------+----------+-------------------------------+  PTA Distal  27                      monophasic                                  +-----------+--------+-----+--------+----------+-------------------------------+  PERO Prox                                      unable to visualize; absent of                                                   flow in area, possible                                                           occlusion                         +-----------+--------+-----+--------+----------+-------------------------------+  PERO Mid    12                      monophasic                                  +-----------+--------+-----+--------+----------+-------------------------------+  PERO Distal 8                       monophasic                                  +-----------+--------+-----+--------+----------+-------------------------------+  Summary: Right: Mild progression is noted compared to previous study. Heterogenous plaque throughout. >50% stenosis in the mid and distal common iliac artery, low end range. <50% stenosis in the left distal external iliac artery, low end range. Three vessel run-off with 30-49% stenosis noted in the distal PTA. Left: Severe progression is noted compared to previous study. Heterogenous plaque throughout; bulky calcified plaque noted throughout the CFA. Possible occlusion in the proximal common iliac artery. Probable two vessel run-off via ATA and PTA; possible proximal peroneal artery occlusion.  See table(s) above for measurements and observations. See ABI report. Technically challenging exam due to body habitus, overlying bowel gas and non-fasting. Vascular consult recommended. Scheduled to consult with Dr. Gwenlyn Found on March 15, 2020. Electronically signed by Kathlyn Sacramento MD on 03/09/2020 at 7:50:53 AM.    Final    Disposition   Pt is being discharged home today in good condition.  Follow-up Plans & Appointments     Follow-up Information    [facility] Follow [facility].   Specialty: Cardiology Why: See appointments below - repeat lower extremity ultrasound on 04/03/20 at 12pm and office visit on 04/24/20 with Dr. Gwenlyn Found. Please arrive 10-15 minutes early to check in to each. Contact information: [place] [phone]  Discharge Instructions    Diet - low sodium heart healthy   Complete by: As directed    Discharge instructions    Complete by: As directed    IMPORTANT!!! Metformin needs to be stopped at least 48 hours after a heart cath. You can restart this the morning of 03/30/20.  You were started on aspirin in addition to your clopidogrel (Plavix) and apixaban (Eliquis). You will take the aspirin for 30 days only then stop this. You will also take a medicine called pantoprazole (Protonix) during these 30 days to protect your stomach. After you stop the aspirin you will still continue to take the clopidogrel (Plavix) and apixaban (Eliquis) like you were doing before.  If you notice any bleeding such as blood in stool, black tarry stools, blood in urine, nosebleeds or any other unusual bleeding, call your doctor immediately. It is not normal to have this kind of bleeding while on a blood thinner and usually indicates there is an underlying problem with one of your body systems that needs to be checked out.   Increase activity slowly   Complete by: As directed    No personal driving for 2 days. You may not return to work until cleared by Dr. Gwenlyn Found. No lifting over 5 lbs for 1 week. No sexual activity for 1 week. Keep procedure site clean & dry. If you notice increased pain, swelling, bleeding or pus, call/return!  You may shower, but no soaking baths/hot tubs/pools for 1 week.      Discharge Medications   Allergies as of 03/28/2020   No Known Allergies     Medication List    TAKE these medications   albuterol 108 (90 Base) MCG/ACT inhaler Commonly known as: VENTOLIN HFA Inhale 1-2 puffs into the lungs every 6 (six) hours as needed for wheezing or shortness of breath.   apixaban 5 MG Tabs tablet Commonly known as: Eliquis Take 1 tablet (5 mg total) by mouth 2 (two) times daily.   aspirin 81 MG EC tablet Take 1 tablet (81 mg total) by mouth daily. Swallow whole. For 30 days ONLY then stop. Start taking on: March 29, 2020   atorvastatin 80 MG tablet Commonly known as: LIPITOR Take 1 tablet (80 mg total) by  mouth daily at 6 PM.   Cinnamon 500 MG capsule Take 500 mg by mouth 2 (two) times daily.   clopidogrel 75 MG tablet Commonly known as: PLAVIX TAKE 1 TABLET (75MG TOTAL) BY MOUTH DAILY What changed:   how much to take  how to take this  when to take this   Farxiga 5 MG Tabs tablet Generic drug: dapagliflozin propanediol Take 5 mg by mouth daily.   Fish Oil 1200 MG Caps Take 1,200 mg by mouth daily.   furosemide 40 MG tablet Commonly known as: LASIX Take 1 tablet (40 mg total) by mouth 2 (two) times daily.   glipiZIDE 10 MG tablet Commonly known as: GLUCOTROL Take 10 mg by mouth 2 (two) times daily before a meal.   lisinopril 2.5 MG tablet Commonly known as: ZESTRIL Take 1 tablet (2.5 mg total) by mouth daily.   metFORMIN 500 MG tablet Commonly known as: GLUCOPHAGE Take 1 tablet (500 mg total) by mouth 2 (two) times daily with a meal. Notes to patient: IMPORTANT!!! Metformin needs to be stopped at least 48 hours after a heart cath. You can restart this the morning of 03/30/20.   metoprolol tartrate 100 MG tablet Commonly known as: LOPRESSOR Take 1 tablet (  100 mg total) by mouth 2 (two) times daily.   pantoprazole 40 MG tablet Commonly known as: PROTONIX Take 1 tablet (40 mg total) by mouth daily. Take for 30 days (while taking aspirin) then stop. Start taking on: March 29, 2020   sildenafil 50 MG tablet Commonly known as: VIAGRA Take 1 tablet (50 mg total) by mouth daily as needed for erectile dysfunction.          Outstanding Labs/Studies   LE arterial duplex 8/23  Duration of Discharge Encounter   Greater than 30 minutes including physician time.  Signed, Charlie Pitter, [name] 03/28/2020, 10:28 AM  Patient seen, examined. Available data reviewed. Agree with findings, assessment, and plan as outlined by Melina Copa, [name].  The patient is independently interviewed and examined.  Bilateral groin sites are clear with mild ecchymoses on the right, but no  hematoma.  Both feet are warm and well perfused.  The patient had an uncomplicated peripheral vascular intervention yesterday.  He is stable for discharge home.  Follow-up is arranged with Dr. Gwenlyn Found.  He will return to work after he sees Dr. Gwenlyn Found in follow-up.  We will add empiric pantoprazole since he will be discharged on triple anticoagulant therapy.  Sherren Mocha, M.D. 03/28/2020 10:46 AM

## 2020-03-29 ENCOUNTER — Telehealth: Payer: Self-pay | Admitting: Cardiovascular Disease

## 2020-03-29 NOTE — Telephone Encounter (Signed)
    Pt c/o medication issue:  1. Name of Medication:   atorvastatin (LIPITOR) 80 MG tablet    2. How are you currently taking this medication (dosage and times per day)? Take 1 tablet (80 mg total) by mouth daily at 6 PM.  3. Are you having a reaction (difficulty breathing--STAT)?   4. What is your medication issue? Pt would like to clarify, he said Dr. [name] stopped this medication and would like to ask if he needs to take this medication again.   Also, he wanted to ask about his f/u with Dr. Allyson Sabal. He was ask to go back to work in 2 weeks but his f/u is on 09/13. He wanted to know if he had to wait to go back to work until he sees Dr. Allyson Sabal, he also said he cant take off work for a month if that's the case.

## 2020-03-29 NOTE — Telephone Encounter (Signed)
Called and spoke with pt, he states he was told to stop his atorvastatin by Dr.[name]. Notified that the only place in the chart where I see he was told to stop his Atorvastatin was a note from 07/20/19-    Provider:   Tobias Alexander, MD   *HOLD YOUR ATORVASTATIN FOR ONE WEEK ONLY THEN CALL OUR OFFICE AT [phone] OR [facility] HOW YOUR SYMPTOMS ARE [place]*    Signed, Tobias Alexander, MD  07/20/2019 10:21 AM    [facility] [address] [place], [place], [place]  [address] Phone: [phone]; Fax: [phone]   Pt states this was right and he did stop and that his symptoms went away and he called back in and states he was told to stop it completely.  notified I did not see this and that he should contact Dr.[name]'s office regarding this.  Pt also states he was asked to go back to work in 2 weeks but his follow up with Dr.Berry isn't until 9/13. He cannot take a full month off of work. Pt states he has an ultrasound coming up (8/23)  Notified I would send this message to Dr.Berry to review and advise. Pt verbalized understanding with no other questions at this time.

## 2020-03-29 NOTE — Telephone Encounter (Signed)
Pt was instructed on 07/20/2019 to stop taking Atorvastatin for one week and call us back to inform us if his myalgias improved with hold, per Dr. [name]. Pt failed to call our office back to report if his symptoms improved or not since stopping, therefore he has not been on any statin since 07/20/2019.  Pt just had a PV Angio done on 8/16 by his PV Physician, Dr. Allyson Sabal. Pt was advised to restart his atorvastatin then, but pt has not taken this since Dec of last year.  Pt states that while being off of atorvastatin, his myalgias significantly improved.  Pt is aware that he will have to be on some kind of statin or management for his CAD/HLP/PAD, but he would like for Dr. [name] or Dr. Allyson Sabal to advise on a regimen that is more tolerable than atorvastatin.  Informed the pt that I will route this message to Dr. [name] to further review and advise on an alternative regimen,  and follow-up with the pt thereafter.  Pt verbalized understanding and agrees with this plan.

## 2020-03-29 NOTE — Telephone Encounter (Signed)
Pt can discus statin Rx with Dr [name]

## 2020-03-29 NOTE — Telephone Encounter (Signed)
Patient called back and wanted Dr. [name] and Ivy's advice on the issue

## 2020-03-30 ENCOUNTER — Other Ambulatory Visit (HOSPITAL_COMMUNITY): Payer: Self-pay | Admitting: Cardiovascular Disease

## 2020-03-30 DIAGNOSIS — Z95828 Presence of other vascular implants and grafts: Secondary | ICD-10-CM

## 2020-03-30 DIAGNOSIS — I739 Peripheral vascular disease, unspecified: Secondary | ICD-10-CM

## 2020-03-30 MED ORDER — ROSUVASTATIN CALCIUM 20 MG PO TABS
20.0000 mg | ORAL_TABLET | Freq: Every day | ORAL | 1 refills | Status: DC
Start: 2020-03-30 — End: 2020-06-02

## 2020-03-30 NOTE — Telephone Encounter (Signed)
Patient can try rosuvastatin at a reduced dose to see if tolerable before appointment with Dr. [name].  Recommend rosuvastatin 20mg  daily.

## 2020-03-30 NOTE — Telephone Encounter (Signed)
I will also CC our Pharmacist in on this conversation, to receive their input on preferred and alternative statin for this pt.

## 2020-03-30 NOTE — Telephone Encounter (Signed)
Spoke with the pt and informed him that per Dr. [name] and our Pharmacist, we will discontinue his atorvastatin (updated this in his allergies as an intolerance), and start him on low dose rosuvastatin 20 mg po daily. Confirmed the pharmacy of choice with the pt. Informed the pt that I will only send in a months supply to his pharmacy, to make sure he tolerates this appropriately.  Advised him if he is tolerating this appropriately, to notify our office when he is getting low on this supply, and so we can kick this to the pharmacy for a 90 day supply thereafter.  Advised him to keep all his follow-up appts with our office and Dr. Hazle Coca office, as scheduled.  Pt verbalized understanding and agrees with this plan.

## 2020-03-30 NOTE — Telephone Encounter (Signed)
Patient just had surgery.  He is going to wait until he is  returning to normal activity before applying cardiac event monitor.

## 2020-03-30 NOTE — Telephone Encounter (Signed)
Patient was returning our call

## 2020-03-30 NOTE — Telephone Encounter (Signed)
Left message for patient to call back  

## 2020-04-03 ENCOUNTER — Ambulatory Visit (HOSPITAL_COMMUNITY)
Admission: RE | Admit: 2020-04-03 | Discharge: 2020-04-03 | Disposition: A | Discharge status: Home / Self Care | Payer: 59 | Source: Ambulatory Visit | Attending: Cardiology | Admitting: Cardiology

## 2020-04-03 ENCOUNTER — Telehealth: Payer: Self-pay | Admitting: Cardiovascular Disease

## 2020-04-03 ENCOUNTER — Other Ambulatory Visit: Payer: Self-pay

## 2020-04-03 ENCOUNTER — Ambulatory Visit (HOSPITAL_BASED_OUTPATIENT_CLINIC_OR_DEPARTMENT_OTHER)
Admission: RE | Admit: 2020-04-03 | Discharge: 2020-04-03 | Disposition: A | Discharge status: Home / Self Care | Payer: 59 | Source: Ambulatory Visit | Attending: Cardiology | Admitting: Cardiology

## 2020-04-03 ENCOUNTER — Other Ambulatory Visit (HOSPITAL_COMMUNITY): Payer: Self-pay | Admitting: Cardiovascular Disease

## 2020-04-03 DIAGNOSIS — Z95828 Presence of other vascular implants and grafts: Secondary | ICD-10-CM

## 2020-04-03 DIAGNOSIS — I739 Peripheral vascular disease, unspecified: Secondary | ICD-10-CM | POA: Insufficient documentation

## 2020-04-03 NOTE — Telephone Encounter (Signed)
New message:    Patient calling concerning a note for work. Patient states he was just in here and did not sign a release form. Please call patient back.

## 2020-04-03 NOTE — Telephone Encounter (Signed)
Letter has been written and sent to patient.

## 2020-04-03 NOTE — Telephone Encounter (Signed)
Called and spoke to patient. He recently had an abdominal aortogram/pv angio w/ CTO intervention of his left common iliac artery with a VBX covered stent on 8/16. He had his vascular ultrasounds today in the [place] office. Patient is a [employer] and is asking if he can have a letter clearing him to go back to work. He states that he feels fines and denies having any complications. Made patient aware that I will send a message to Dr. Allyson Sabal for review.

## 2020-04-03 NOTE — Telephone Encounter (Signed)
Okay to get a letter to return to work.  No lifting anything more than 25 pounds until after 7 days from his procedure.

## 2020-04-10 ENCOUNTER — Other Ambulatory Visit: Payer: Self-pay

## 2020-04-10 ENCOUNTER — Institutional Professional Consult (permissible substitution): Payer: 59 | Admitting: Emergency Medicine

## 2020-04-10 ENCOUNTER — Telehealth: Payer: Self-pay | Admitting: Cardiology

## 2020-04-10 MED ORDER — APIXABAN 5 MG PO TABS: 5.0000 mg | ORAL_TABLET | Freq: Two times a day (BID) | ORAL | 1 refills | Status: DC

## 2020-04-10 NOTE — Telephone Encounter (Signed)
*  STAT* If patient is at the pharmacy, call can be transferred to refill team.   1. Which medications need to be refilled? (please list name of each medication and dose if known) apixaban (ELIQUIS) 5 MG TABS tablet  2. Which pharmacy/location (including street and city if local pharmacy) is medication to be sent to? [facility] [address] [place] - [address]  3. Do they need a 30 day or 90 day supply? 90

## 2020-04-10 NOTE — Telephone Encounter (Signed)
Last OV 03/15/20 Scr 1.04 on 03/28/20 49 years old 124kg Eliquis sent to pharmacy

## 2020-04-24 ENCOUNTER — Ambulatory Visit: Payer: 59 | Admitting: Cardiovascular Disease

## 2020-04-24 ENCOUNTER — Other Ambulatory Visit: Payer: Self-pay

## 2020-04-24 ENCOUNTER — Encounter: Payer: Self-pay | Admitting: Cardiovascular Disease

## 2020-04-24 VITALS — BP 116/76 | HR 59 | Ht 71.0 in | Wt 265.0 lb

## 2020-04-24 DIAGNOSIS — I739 Peripheral vascular disease, unspecified: Secondary | ICD-10-CM | POA: Diagnosis not present

## 2020-04-24 NOTE — Assessment & Plan Note (Signed)
Ian [name] returns a for follow-up.  He was initially referred to me for claudication.  His Doppler suggest an occluded iliac which I found on angiography on 03/27/2020.  I was able to cross this, and placed a VBX stent successfully.  His Dopplers normalized and his claudication has resolved.  He currently is on Eliquis for his PAF and aspirin Plavix.  Told him to discontinue his aspirin and continue his Plavix.  We will recheck lower extremity arterial Doppler studies in 6 months.

## 2020-04-24 NOTE — Patient Instructions (Signed)
Medication Instructions:  Your physician recommends that you continue on your current medications as directed. Please refer to the Current Medication list given to you today.  *If you need a refill on your cardiac medications before your next appointment, please call your pharmacy*    Testing/Procedures:  In 6 months--prior to appointment with Dr. Allyson Sabal: Your physician has requested that you have a aorta and iliac duplex. During this test, an ultrasound is used to evaluate blood flow to the aorta and iliac arteries. Allow one hour for this exam. Do not eat after midnight the day before and avoid carbonated beverages.    Follow-Up: At [facility], you and your health needs are our priority.  As part of our continuing mission to provide you with exceptional heart care, we have created designated Provider Care Teams.  These Care Teams include your primary Cardiologist (physician) and Advanced Practice Providers (APPs -  Physician Assistants and Nurse Practitioners) who all work together to provide you with the care you need, when you need it.  We recommend signing up for the patient portal called "MyChart".  Sign up information is provided on this After Visit Summary.  MyChart is used to connect with patients for Virtual Visits (Telemedicine).  Patients are able to view lab/test results, encounter notes, upcoming appointments, etc.  Non-urgent messages can be sent to your provider as well.   To learn more about what you can do with MyChart, go to [email or web address].    Your next appointment:   6 month(s)  The format for your next appointment:   In Person  Provider:   You may see Nanetta Batty, MD or one of the following Advanced Practice Providers on your designated Care Team:    Corine Shelter, [name]  [place], [place]  Edd Fabian, [place]    Other Instructions Please call our office 2 months in advance to schedule your follow-up appointment with Dr. Allyson Sabal.

## 2020-04-24 NOTE — Progress Notes (Signed)
04/24/2020 Ian [name]   1971-05-18  [id number]  Primary Physician Patient, No Pcp Per Primary Cardiologist: Runell Gess MD FACP, [place], [place], [place]  HPI:  Ian [name] is a 49 y.o.  moderately overweight married Caucasian male father of 3 children, grandfather of 3 grandchildren who I last saw in the office 03/15/2020.  He is fairly tearful today, because his wife Ian [name] who is with him in the office is has left him. He was referred by Dr. [name], his cardiologist, for peripheral vascular evaluation because of lifestyle imaging claudication.  He works as a Agricultural consultant.  His risk factors include 60-pack-year tobacco abuse continue to smoke 1-1/2 packs a day recalcitrant to risk factor modification.  He has treated diabetes and hyperlipidemia.  He also has PAF on Eliquis.  His family history is remarkable for father who apparently had CAD.  He has had multiple coronary interventions dating back to 2016 when he had RCA stent.  He had orbital atherectomy his RCA restenting 09/05/2016.  He has had claudication for 4 years primarily on the left side with recent Doppler studies performed 03/06/2020 revealing a right ABI that was normal and a left of 0.53 with an occluded left common iliac artery.  I performed peripheral angiography on him 03/27/2020 revealing an occluded left common iliac artery which I was able to cross and stented with a VBX covered stent.  He was discharged home the following day.  His Dopplers performed 04/03/2020 essentially normalized and his claudication has resolved.  Unfortunately, he does continue to smoke however.   Current Meds  Medication Sig  . albuterol (VENTOLIN HFA) 108 (90 Base) MCG/ACT inhaler Inhale 1-2 puffs into the lungs every 6 (six) hours as needed for wheezing or shortness of breath.   Marland Kitchen apixaban (ELIQUIS) 5 MG TABS tablet Take 1 tablet (5 mg total) by mouth 2 (two) times daily.  Marland Kitchen aspirin EC 81 MG EC tablet Take 1 tablet (81 mg total) by  mouth daily. Swallow whole. For 30 days ONLY then stop.  . Cinnamon 500 MG capsule Take 500 mg by mouth 2 (two) times daily.   . clopidogrel (PLAVIX) 75 MG tablet TAKE 1 TABLET (75MG  TOTAL) BY MOUTH DAILY (Patient taking differently: Take 75 mg by mouth daily. TAKE 1 TABLET (75MG  TOTAL) BY MOUTH DAILY)  . dapagliflozin propanediol (FARXIGA) 5 MG TABS tablet Take 5 mg by mouth daily.  . furosemide (LASIX) 40 MG tablet Take 1 tablet (40 mg total) by mouth 2 (two) times daily.  glipiZIDE (GLUCOTROL) 10 MG tablet Take 10 mg by mouth 2 (two) times daily before a meal.   . lisinopril (ZESTRIL) 2.5 MG tablet Take 1 tablet (2.5 mg total) by mouth daily.  . metFORMIN (GLUCOPHAGE) 500 MG tablet Take 1 tablet (500 mg total) by mouth 2 (two) times daily with a meal.  . metoprolol tartrate (LOPRESSOR) 100 MG tablet Take 1 tablet (100 mg total) by mouth 2 (two) times daily.  . Omega-3 Fatty Acids (FISH OIL) 1200 MG CAPS Take 1,200 mg by mouth daily.   . pantoprazole (PROTONIX) 40 MG tablet Take 1 tablet (40 mg total) by mouth daily. Take for 30 days (while taking aspirin) then stop.  . rosuvastatin (CRESTOR) 20 MG tablet Take 1 tablet (20 mg total) by mouth daily.  . sildenafil (VIAGRA) 50 MG tablet Take 1 tablet (50 mg total) by mouth daily as needed for erectile dysfunction.     Allergies  Allergen Reactions  .  Atorvastatin Other (See Comments)    Pt reports causes myalgias.     Social History   Socioeconomic History  . Marital status: Married    Spouse name: Not on file  . Number of children: Not on file  . Years of education: Not on file  . Highest education level: Not on file  Occupational History  . Not on file  Tobacco Use  . Smoking status: Current Every Day Smoker    Packs/day: 2.00    Years: 35.00    Pack years: 70.00    Types: Cigarettes  . Smokeless tobacco: Never Used  Vaping Use  . Vaping Use: Never used  Substance and Sexual Activity  . Alcohol use: Yes    Alcohol/week:  0.0 standard drinks    Comment: 09/04/2016  "might have a few drinks/year; nothing since 05/2015"  . Drug use: Yes    Types: Cocaine, Marijuana    Comment: 09/04/2016 "haven't touched drugs since 1988"  . Sexual activity: Yes  Other Topics Concern  . 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   Social Determinants of Health   Financial Resource Strain:   . Difficulty of Paying Living Expenses: Not on file  Food Insecurity:   . Worried About Programme researcher, broadcasting/film/video in the Last Year: Not on file  . Ran Out of Food in the Last Year: Not on file  Transportation Needs:   . Lack of Transportation (Medical): Not on file  . Lack of Transportation (Non-Medical): Not on file  Physical Activity:   . Days of Exercise per Week: Not on file  . Minutes of Exercise per Session: Not on file  Stress:   . Feeling of Stress : Not on file  Social Connections:   . Frequency of Communication with Friends and Family: Not on file  . Frequency of Social Gatherings with Friends and Family: Not on file  . Attends Religious Services: Not on file  . Active Member of Clubs or Organizations: Not on file  . Attends Banker Meetings: Not on file  . Marital Status: Not on file  Intimate Partner Violence:   . Fear of Current or Ex-Partner: Not on file  . Emotionally Abused: Not on file  . Physically Abused: Not on file  . Sexually Abused: Not on file     Review of Systems: General: negative for chills, fever, night sweats or weight changes.  Cardiovascular: negative for chest pain, dyspnea on exertion, edema, orthopnea, palpitations, paroxysmal nocturnal dyspnea or shortness of breath Dermatological: negative for rash Respiratory: negative for cough or wheezing Urologic: negative for hematuria Abdominal: negative for nausea, vomiting, diarrhea, bright red blood per rectum, melena, or hematemesis Neurologic: negative for visual changes, syncope,  or dizziness All other systems reviewed and are otherwise negative except as noted above.    Blood pressure 116/76, pulse (!) 59, height 5\' 11"  (1.803 m), weight 265 lb (120.2 kg).  General appearance: alert and no distress Neck: no adenopathy, no carotid bruit, no JVD, supple, symmetrical, trachea midline and thyroid not enlarged, symmetric, no tenderness/mass/nodules Lungs: clear to auscultation bilaterally Heart: regular rate and rhythm, S1, S2 normal, no murmur, click, rub or gallop Extremities: extremities normal, atraumatic, no cyanosis or edema Pulses: 2+ and symmetric Skin: Skin color, texture, turgor normal. No rashes or lesions Neurologic: Alert and oriented X 3, normal strength and tone. Normal symmetric reflexes. Normal coordination and gait  EKG not  performed today  ASSESSMENT AND PLAN:   Claudication in peripheral vascular disease [facility]) Mr. Buda returns a for follow-up.  He was initially referred to me for claudication.  His Doppler suggest an occluded iliac which I found on angiography on 03/27/2020.  I was able to cross this, and placed a VBX stent successfully.  His Dopplers normalized and his claudication has resolved.  He currently is on Eliquis for his PAF and aspirin Plavix.  Told him to discontinue his aspirin and continue his Plavix.  We will recheck lower extremity arterial Doppler studies in 6 months.      Runell Gess MD FACP,FACC,FAHA, [facility] 04/24/2020 4:19 PM

## 2020-04-25 ENCOUNTER — Ambulatory Visit: Payer: 59 | Admitting: Cardiovascular Disease

## 2020-05-17 ENCOUNTER — Institutional Professional Consult (permissible substitution): Payer: 59 | Admitting: Pulmonary Disease

## 2020-06-02 ENCOUNTER — Other Ambulatory Visit: Payer: Self-pay | Admitting: Cardiovascular Disease

## 2020-06-02 ENCOUNTER — Telehealth: Payer: Self-pay | Admitting: Cardiovascular Disease

## 2020-06-02 MED ORDER — ROSUVASTATIN CALCIUM 20 MG PO TABS: 20.0000 mg | ORAL_TABLET | Freq: Every day | ORAL | 1 refills | Status: DC

## 2020-06-02 NOTE — Telephone Encounter (Signed)
Pt c/o BP issue: STAT if pt c/o blurred vision, one-sided weakness or slurred speech  1. What are your last 5 BP readings? 100/60 (no additional readings available)  2. Are you having any other symptoms (ex. Dizziness, headache, blurred vision, passed out)? No   3. What is your BP issue? Patient states he lost about 20 lbs and his BP has dropped. He would like to know if one of his medications need to be adjusted. Please advise.

## 2020-06-02 NOTE — Telephone Encounter (Signed)
Pt called in as he told by my PCP he may be able to stop or decrease some of his BP medications since his BP is 100/62 at last visit and he is down 20 lbs. Informed patient that his not very different from his previous when in the offoce and at this time should not make adjustments unless he is feeling bad or wait until his Dec. appt with Dr. [name]. Pt stated he is out of Crestor and has been taking his Plavix as ordered. Crestor refills sent in earlier today and patient stated he will pick it up tomorrow when he gets the rest of his medications. No additional questions at this time.

## 2020-06-02 NOTE — Telephone Encounter (Signed)
*  STAT* If patient is at the pharmacy, call can be transferred to refill team.   1. Which medications need to be refilled? (please list name of each medication and dose if known) rosuvastatin (CRESTOR) 20 MG tablet  2. Which pharmacy/location (including street and city if local pharmacy) is medication to be sent to? [id number] - [place], [place] - [address]  3. Do they need a 30 day or 90 day supply? 90 day supply

## 2020-07-19 ENCOUNTER — Ambulatory Visit: Payer: 59 | Admitting: Cardiology

## 2020-07-19 ENCOUNTER — Encounter: Payer: Self-pay | Admitting: Cardiology

## 2020-07-19 ENCOUNTER — Other Ambulatory Visit: Payer: Self-pay

## 2020-07-19 VITALS — BP 118/74 | HR 74 | Ht 71.0 in | Wt 263.2 lb

## 2020-07-19 DIAGNOSIS — I2510 Atherosclerotic heart disease of native coronary artery without angina pectoris: Secondary | ICD-10-CM | POA: Diagnosis not present

## 2020-07-19 DIAGNOSIS — I2583 Coronary atherosclerosis due to lipid rich plaque: Secondary | ICD-10-CM | POA: Diagnosis not present

## 2020-07-19 DIAGNOSIS — I10 Essential (primary) hypertension: Secondary | ICD-10-CM | POA: Diagnosis not present

## 2020-07-19 DIAGNOSIS — I5033 Acute on chronic diastolic (congestive) heart failure: Secondary | ICD-10-CM

## 2020-07-19 DIAGNOSIS — I739 Peripheral vascular disease, unspecified: Secondary | ICD-10-CM

## 2020-07-19 DIAGNOSIS — I4819 Other persistent atrial fibrillation: Secondary | ICD-10-CM

## 2020-07-19 NOTE — Patient Instructions (Signed)

## 2020-07-19 NOTE — Progress Notes (Signed)
Cardiology Office Note    Date:  07/19/2020   ID:  Ian [name], DOB [birth year], MRN [id number]  PCP:  Benjaman Kindler, MD  Cardiologist: Tobias Alexander, MD EPS: None  Reason for visit: 6 months follow-up for CAD  History of Present Illness:  Ian [name] is a 49 y.o. male with history of CAD status post NSTEMI 2016 treated with DES to the RCA with residual 25% mid circumflex, LVEF 45 to 50%.  He also had atrial fibrillation with RVR at that time, CHA2DS2-VASc was 2 and he was on aspirin Plavix and Eliquis for 30 days and then aspirin was stopped.  Abnormal stress test 2018 for possible inferior ischemia and cardiac cath showed 80% in-stent restenosis of the RCA treated with balloon angioplasty.  There was also a 90% mid RCA unable to cross with the balloon so underwent staged orbital atherectomy and DES placement 09/05/2016.  EF 40 to 45% at that time.  Patient also has hypertension, DM type II, HLD, tobacco abuse and obesity. Holter monitor 04/2018 showed atrial fibrillation 80% a lot of the time  times RVR metoprolol increased to 50 mg twice daily and chest pain resolved.  Patient saw Nada Boozer, NP 10/12/2018 complaining of increased chest pain.   Cardiac cath showed widely patent stents with only mild restenosis, 50% mid circumflex nonsignificant by DFR, proximal LAD to mid LAD 25%, no aortic valve stenosis.  Medical therapy recommended.  Patient is coming after 6 months, he has lost 20 pounds and is cutting down on amount of cigarettes currently smoking about 15.  He is lipid numbers and hemoglobin A1c have improved.  He continues to work as a Naval architect and denies any chest pain or shortness of breath.  He denies orthostatic hypotension, no recent claudication and no lower extremity edema.  Past Medical History:  Diagnosis Date  . Arthritis    "left ankle" (09/04/2016)  . Cardiomyopathy (HCC)    a. EF 40-45% by prior echo. .b. 50-55% in 02/2020.  Marland Kitchen Complication of anesthesia     "hard to get me out of it sometimes; usually takes me longer to get under"  . Coronary artery disease    a. multiple PCIs to RCA.  . Essential hypertension 12/19/2015  . Hyperlipidemia 05/21/2015  . Hypertension   . NSTEMI (non-ST elevated myocardial infarction) (HCC) 05/2015   [name] 05/23/2015 "minor one"  . PAD (peripheral artery disease) (HCC)   . PAF (paroxysmal atrial fibrillation) (HCC)   . Tobacco abuse 05/21/2015  . Type II diabetes mellitus (HCC)    Past Surgical History:  Procedure Laterality Date  . ABDOMINAL AORTOGRAM W/LOWER EXTREMITY Bilateral 03/27/2020   Procedure: ABDOMINAL AORTOGRAM W/LOWER EXTREMITY;  Surgeon: Runell Gess, MD;  Location: [facility] INVASIVE CV LAB;  Service: Cardiovascular;  Laterality: Bilateral;  . CARDIAC CATHETERIZATION  2000, 2005   "less than 20% blockage"  . CARDIAC CATHETERIZATION N/A 05/22/2015   Procedure: Left Heart Cath and Coronary Angiography;  Surgeon: [name], MD;  Location: [facility] INVASIVE CV LAB;  Service: Cardiovascular;  Laterality: N/A;  . CARDIAC CATHETERIZATION N/A 05/22/2015   Procedure: Coronary Stent Intervention;  Surgeon: [name], MD;  Location: [facility] INVASIVE CV LAB;  Service: Cardiovascular;  Laterality: N/A;  . CARDIAC CATHETERIZATION N/A 09/04/2016   Procedure: Left Heart Cath and Coronary Angiography;  Surgeon: [name], MD;  Location: [facility] INVASIVE CV LAB;  Service: Cardiovascular;  Laterality: N/A;  . CARDIAC CATHETERIZATION N/A 09/04/2016   Procedure: Coronary Balloon  Angioplasty;  Surgeon: [name], MD;  Location: [facility] INVASIVE CV LAB;  Service: Cardiovascular;  Laterality: N/A;  . CARDIAC CATHETERIZATION N/A 09/05/2016   Procedure: Coronary Atherectomy;  Surgeon: Marykay Lex, MD;  Location: [facility] INVASIVE CV LAB;  Service: Cardiovascular;  Laterality: N/A;  . CARDIAC CATHETERIZATION N/A 09/05/2016   Procedure: Coronary Stent Intervention;  Surgeon: Marykay Lex, MD;  Location: [facility]  INVASIVE CV LAB;  Service: Cardiovascular;  Laterality: N/A;  . CARDIAC CATHETERIZATION N/A 09/05/2016   Procedure: Temporary Pacemaker;  Surgeon: Marykay Lex, MD;  Location: [facility] INVASIVE CV LAB;  Service: Cardiovascular;  Laterality: N/A;  . CORONARY ANGIOPLASTY    . INTRAVASCULAR PRESSURE WIRE/FFR STUDY N/A 10/16/2018   Procedure: INTRAVASCULAR PRESSURE WIRE/FFR STUDY;  Surgeon: [name], MD;  Location: [facility] INVASIVE CV LAB;  Service: Cardiovascular;  Laterality: N/A;  . LEFT HEART CATH AND CORONARY ANGIOGRAPHY N/A 10/16/2018   Procedure: LEFT HEART CATH AND CORONARY ANGIOGRAPHY;  Surgeon: [name], MD;  Location: [facility] INVASIVE CV LAB;  Service: Cardiovascular;  Laterality: N/A;  . MULTIPLE TOOTH EXTRACTIONS  2013   "all of them"  . PERIPHERAL VASCULAR INTERVENTION  03/27/2020   Procedure: PERIPHERAL VASCULAR INTERVENTION;  Surgeon: Runell Gess, MD;  Location: [facility] INVASIVE CV LAB;  Service: Cardiovascular;;  Left iliac artery   . TYMPANOSTOMY TUBE PLACEMENT Bilateral 1970s   Current Medications: Current Meds  Medication Sig  . albuterol (VENTOLIN HFA) 108 (90 Base) MCG/ACT inhaler Inhale 1-2 puffs into the lungs every 6 (six) hours as needed for wheezing or shortness of breath.   Marland Kitchen apixaban (ELIQUIS) 5 MG TABS tablet Take 1 tablet (5 mg total) by mouth 2 (two) times daily.  . Cinnamon 500 MG capsule Take 500 mg by mouth 2 (two) times daily.   . clopidogrel (PLAVIX) 75 MG tablet TAKE 1 TABLET (  TOTAL) BY MOUTH DAILY  . dapagliflozin propanediol (FARXIGA) 5 MG TABS tablet Take 5 mg by mouth daily.  . furosemide (LASIX) 40 MG tablet Take 1 tablet (40 mg total) by mouth 2 (two) times daily.  Marland Kitchen glipiZIDE (GLUCOTROL) 10 MG tablet Take 10 mg by mouth 2 (two) times daily before a meal.   . lisinopril (ZESTRIL) 2.5 MG tablet Take 1 tablet (2.5 mg total) by mouth daily.  . metFORMIN (GLUCOPHAGE) 500 MG tablet Take 1 tablet (500 mg total) by mouth 2 (two) times daily with a  meal.  . metoprolol tartrate (LOPRESSOR) 100 MG tablet Take 1 tablet (100 mg total) by mouth 2 (two) times daily.  . Omega-3 Fatty Acids (FISH OIL) 1200 MG CAPS Take 1,200 mg by mouth daily.   . rosuvastatin (CRESTOR) 20 MG tablet Take 1 tablet (20 mg total) by mouth daily.  . sildenafil (VIAGRA) 50 MG tablet Take 1 tablet (50 mg total) by mouth daily as needed for erectile dysfunction.    Allergies:   Atorvastatin   Social History   Socioeconomic History  . Marital status: Married    Spouse name: Not on file  . Number of children: Not on file  . Years of education: Not on file  . Highest education level: Not on file  Occupational History  . Not on file  Tobacco Use  . Smoking status: Current Every Day Smoker    Packs/day: 2.00    Years: 35.00    Pack years: 70.00    Types: Cigarettes  . Smokeless tobacco: Never Used  Vaping Use  . Vaping Use: Never used  Substance and Sexual Activity  . Alcohol use: Yes    Alcohol/week: 0.0 standard drinks    Comment: 09/04/2016  "might have a few drinks/year; nothing since 05/2015"  . Drug use: Yes    Types: Cocaine, Marijuana    Comment: 09/04/2016 "haven't touched drugs since 1988"  . Sexual activity: Yes  Other Topics Concern  . 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   Social Determinants of Health   Financial Resource Strain:   . Difficulty of Paying Living Expenses: Not on file  Food Insecurity:   . Worried About Programme researcher, broadcasting/film/video in the Last Year: Not on file  . Ran Out of Food in the Last Year: Not on file  Transportation Needs:   . Lack of Transportation (Medical): Not on file  . Lack of Transportation (Non-Medical): Not on file  Physical Activity:   . Days of Exercise per Week: Not on file  . Minutes of Exercise per Session: Not on file  Stress:   . Feeling of Stress : Not on file  Social Connections:   . Frequency of Communication with Friends and Family:  Not on file  . Frequency of Social Gatherings with Friends and Family: Not on file  . Attends Religious Services: Not on file  . Active Member of Clubs or Organizations: Not on file  . Attends Banker Meetings: Not on file  . Marital Status: Not on file     Family History:  The patient's family history includes Diabetes in his maternal grandmother, mother, and sister; Heart attack in his father.   ROS:   Please see the history of present illness.    Review of Systems  Constitutional: Negative.  HENT: Negative.   Cardiovascular: Positive for chest pain and dyspnea on exertion.  Respiratory: Negative.   Endocrine: Negative.   Hematologic/Lymphatic: Negative.   Musculoskeletal: Negative.   Gastrointestinal: Negative.   Genitourinary: Negative.   Neurological: Negative.    All other systems reviewed and are negative.  PHYSICAL EXAM:   VS:  BP 118/74   Pulse 74   Ht 5\' 11"  (1.803 m)   Wt 263 lb 3.2 oz (119.4 kg)   SpO2 95%   BMI 36.71 kg/m   Physical Exam  GEN: Obese, in no acute distress  Neck: no JVD, carotid bruits, or masses Cardiac:RRR; no murmurs, rubs, or gallops  Respiratory: Decreased breath sounds but clear to auscultation bilaterally, normal work of breathing GI: soft, nontender, nondistended, + BS Ext: without cyanosis, clubbing, or edema, Good distal pulses bilaterally Neuro:  Alert and Oriented x 3 Psych: euthymic mood, full affect  Wt Readings from Last 3 Encounters:  07/19/20 263 lb 3.2 oz (119.4 kg)  04/24/20 265 lb (120.2 kg)  03/27/20 273 lb 11.2 oz (124.1 kg)    He is EKG today shows atrial fibrillation, previously sinus bradycardia.  This was personally reviewed.  Studies/Labs Reviewed:   Recent Labs: 07/20/2019: ALT 22; TSH 1.130 03/28/2020: BUN 12; Creatinine, Ser 1.04; Hemoglobin 17.4; Platelets 128; Potassium 4.3; Sodium 139   Lipid Panel    Component Value Date/Time   CHOL 95 (L) 07/20/2019 1026   TRIG 169 (H) 07/20/2019 1026    HDL 27 (L) 07/20/2019 1026   CHOLHDL 3.5 07/20/2019 1026   CHOLHDL 7.9 05/22/2015 0233   VLDL UNABLE TO CALCULATE IF TRIGLYCERIDE OVER 400 mg/dL 07/22/2015 40/98/1191   LDLCALC 40 07/20/2019 1026   Additional  studies/ records that were reviewed today include:  Cardiac cath 10/16/18  Previously placed Prox RCA to Mid RCA drug eluting stents are widely patent, with only mild restenosis.  Mid Cx lesion is 50% stenosed. Not significant by DFR.  Prox LAD to Mid LAD lesion is 25% stenosed.  LV end diastolic pressure is normal.  There is no aortic valve stenosis.   Continue aggressive medical therapy.    Echo 03/06/2020  1. Poor acoustic windows Basal inferior/inferoseptal hypokinesis . Left  ventricular ejection fraction, by estimation, is 50 to 55%). Left  ventricular diastolic parameters are indeterminate.  2. Right ventricular systolic function is mildly reduced. The right  ventricular size is mildly enlarged.  3. Right atrial size was mildly dilated.  4. The mitral valve is grossly normal. Mild mitral valve regurgitation.  5. The aortic valve is normal in structure. Aortic valve regurgitation is  not visualized.  6. The inferior vena cava is normal in size with greater than 50%  respiratory variability, suggesting right atrial pressure of 3 mmHg.   Comparison(s): 08/30/16 EF 40-45%.    CT at [facility] 04/2018 IMPRESSION Impression:  No evidence for pulmonary emboli.  Mildly prominent right hilar lymph node and small right lung nodule nonspecific. Follow-up could be obtained to assure stability.    ASSESSMENT:    1. Coronary artery disease due to lipid rich plaque   2. PAD (peripheral artery disease) (HCC)   3. Essential hypertension   4. Acute on chronic diastolic CHF (congestive heart failure), NYHA class 3 (HCC)   5. Persistent atrial fibrillation (HCC)    PLAN:  In order of problems listed above:  CAD with history of non-STEMI 2016 treated with DES to the RCA,  balloon angioplasty to the RCA for 80% in-stent restenosis 2018 also staged orbital atherectomy and DES to the mid RCA 09/05/2016 LVEF 40 to 45%.  repeat cardiac catheterization 10/16/2018 with widely patent stents in the proximal RCA to mid RCA and only mild restenosis in the mid circumflex 50% not significant by DFR.  Proximal LAD to mid LAD 25%.  Medical therapy recommended.   We will continue Eliquis and Plavix, no aspirin.  We will continue metoprolol and lisinopril as well as Crestor.  He has no side effects.  Acute on chronic diastolic CHF, history of ischemic cardiomyopathy with ejection fraction of 40% -Most recent echocardiogram showed improvement of LVEF from 40 to 45% to 50 to 55% He appears euvolemic, I will continue Lasix to 40 mg p.o. twice daily, his most recent creatinine was normal.  Persistent atrial fibrillation  -No bleeding, hemoglobin is 17, no palpitations.  We will continue rate control.  Peripheral arterial disease -Followed by Dr. Gery PrayBarry  Hypertension blood pressure well controlled  Hyperlipidemia on Crestor 20 mg daily, he is tolerating it well, his most recent lipids were LDL 53, HDL 25 and triglycerides 139.  Tobacco abuse continues to smoke but cutting down, he is encouraged to continue decreasing edema.    Medication Adjustments/Labs and Tests Ordered: Current medicines are reviewed at length with the patient today.  Concerns regarding medicines are outlined above.  Medication changes, Labs and Tests ordered today are listed in the Patient Instructions below. Patient Instructions  Medication Instructions:   Your physician recommends that you continue on your current medications as directed. Please refer to the Current Medication list given to you today.  *If you need a refill on your cardiac medications before your next appointment, please call your pharmacy*   Follow-Up:  At [facility], you and your health needs are our priority.  As part of our  continuing mission to provide you with exceptional heart care, we have created designated Provider Care Teams.  These Care Teams include your primary Cardiologist (physician) and Advanced Practice Providers (APPs -  Physician Assistants and Nurse Practitioners) who all work together to provide you with the care you need, when you need it.  We recommend signing up for the patient portal called "MyChart".  Sign up information is provided on this After Visit Summary.  MyChart is used to connect with patients for Virtual Visits (Telemedicine).  Patients are able to view lab/test results, encounter notes, upcoming appointments, etc.  Non-urgent messages can be sent to your provider as well.   To learn more about what you can do with MyChart, go to [email or web address].    Your next appointment:   6 month(s)  The format for your next appointment:   In Person  Provider:   Tobias Alexander, MD        Signed, Tobias Alexander, MD  07/19/2020 8:35 AM    [facility] [address] [place], [place], [place]  [address] Phone: [phone]; Fax: [phone]

## 2020-07-31 ENCOUNTER — Other Ambulatory Visit: Payer: Self-pay | Admitting: Cardiology

## 2020-07-31 NOTE — Telephone Encounter (Signed)
Pt's medication was sent to pt's pharmacy as requested. Confirmation received.  °

## 2020-09-18 ENCOUNTER — Other Ambulatory Visit: Payer: Self-pay

## 2020-09-18 MED ORDER — CLOPIDOGREL BISULFATE 75 MG PO TABS: ORAL_TABLET | ORAL | 3 refills | Status: DC

## 2020-09-18 NOTE — Telephone Encounter (Signed)
Pt's medication was sent to pt's pharmacy as requested. Confirmation received.  °

## 2020-10-12 MED ORDER — APIXABAN 5 MG PO TABS: 5.0000 mg | ORAL_TABLET | Freq: Two times a day (BID) | ORAL | 1 refills | Status: DC

## 2020-10-24 ENCOUNTER — Ambulatory Visit (HOSPITAL_BASED_OUTPATIENT_CLINIC_OR_DEPARTMENT_OTHER)
Admission: RE | Admit: 2020-10-24 | Discharge: 2020-10-24 | Disposition: A | Discharge status: Home / Self Care | Payer: 59 | Source: Ambulatory Visit | Attending: Cardiovascular Disease | Admitting: Cardiovascular Disease

## 2020-10-24 ENCOUNTER — Other Ambulatory Visit: Payer: Self-pay

## 2020-10-24 ENCOUNTER — Ambulatory Visit (HOSPITAL_COMMUNITY)
Admission: RE | Admit: 2020-10-24 | Discharge: 2020-10-24 | Disposition: A | Discharge status: Home / Self Care | Payer: 59 | Source: Ambulatory Visit | Attending: Cardiovascular Disease | Admitting: Cardiovascular Disease

## 2020-10-24 ENCOUNTER — Other Ambulatory Visit (HOSPITAL_COMMUNITY): Payer: Self-pay | Admitting: Cardiovascular Disease

## 2020-10-24 DIAGNOSIS — I739 Peripheral vascular disease, unspecified: Secondary | ICD-10-CM

## 2020-10-24 DIAGNOSIS — Z95828 Presence of other vascular implants and grafts: Secondary | ICD-10-CM | POA: Insufficient documentation

## 2020-11-09 ENCOUNTER — Encounter: Payer: Self-pay | Admitting: Cardiology

## 2020-11-23 NOTE — Telephone Encounter (Signed)
Monitor was never returned and marked "lost" in [place] system, Order will be cancelled

## 2020-12-04 MED ORDER — ROSUVASTATIN CALCIUM 20 MG PO TABS: 20.0000 mg | ORAL_TABLET | Freq: Every day | ORAL | 1 refills | Status: DC

## 2021-01-15 ENCOUNTER — Ambulatory Visit: Payer: 59 | Admitting: Cardiology

## 2021-04-12 ENCOUNTER — Other Ambulatory Visit: Payer: Self-pay | Admitting: Cardiovascular Disease

## 2021-04-13 NOTE — Telephone Encounter (Signed)
Prescription refill request for Eliquis received. Indication:afib Last office visit:nelson 07/19/20 Scr:1.1 06/02/20 Age: 37m PVXYIA:165.5VZ

## 2021-06-16 ENCOUNTER — Other Ambulatory Visit: Payer: Self-pay | Admitting: Cardiovascular Disease

## 2021-10-02 ENCOUNTER — Encounter (HOSPITAL_COMMUNITY): Payer: Self-pay

## 2023-05-05 LAB — HM DIABETES EYE EXAM

## 2023-06-01 NOTE — Progress Notes (Signed)
Cardiology Office Note:  .   Date:  06/10/2023  ID:  [name], DOB 05/23/1971, MRN [id number] PCP: Park Meo, FNP  [facility] Providers Cardiologist:  Nanetta Batty, MD  History of Present Illness: .   Ian [name] is a 52 y.o. male with a past medical history of CAD, atrial fibrillation, peripheral artery disease, HTN, HLD, type 2 DM, tobacco use. Patient is followed by Dr. Allyson Sabal, was previously followed by Dr. [name]. Presents today for an overdue annual follow up appointment.   Per chart review, patient previously had NSTEMI in 2016.  Underwent successful PCI of the mid RCA with drug-eluting stent.  Echocardiogram showed EF 45-50%.  He was noted to have A-fib at that time and was started on Eliquis.  Later, echocardiogram in 08/2016 showed EF 40-45%.  Patient underwent nuclear stress test on 08/30/16 that suggested ischemia.  He underwent left heart catheterization 09/04/16 that showed 80% stenosis/in-stent restenosis of prior stent that was treated with scoring balloon angioplasty.  Also had a second mid RCA lesion that was 90% stenosed and passed the stent.  Unable to cross this lesion with balloon.  Underwent staged intervention with successful orbital atherectomy based PCI of the distal portion of the mid RCA, overlapping the more proximal stent with another DES.  Patient wore a cardiac monitor in 03/2018 that showed atrial fibrillation 85% of the time, average heart rate 88 bpm but with frequent episodes of A-fib with RVR.  His metoprolol dose was increased.  Patient underwent left heart catheterization on 10/16/2018 that showed previously placed proximal-mid RCA stents were widely patent with only mild restenosis, 50% stenosis in mid circumflex that was not significant, and 25% stenosis in proximal-mid LAD.  Recommended medical therapy.  Echocardiogram in 02/2020 showed EF 50-55%, mildly reduced RV function, mild MR.  Nuclear stress test in 02/2020 showed no ischemia.  Patient  was referred to Dr. Allyson Sabal in 2021 for peripheral artery disease.Underwent abdominal aortogram on 03/27/2020 that showed the abdominal aorta was widely patent, the right lower extremity was essentially normal with three-vessel runoff, and the left lower extremity had short segment CTO proximal left common iliac artery stenosis, 75% calcified exophytic plaque in the left common femoral artery with otherwise normal SFA, popliteal, three-vessel runoff.  He underwent successful left common iliac artery CTO intervention with stent.  Follow-up ABIs in 10/2020 were in normal range bilaterally.  Today, patient presents to reestablish cardiology care after living in [facility] for the past few years. When in [place], he denies having any cardiac events. However, he did report having a stent placed in the left groin due to a 100% blockage in the main artery of the left leg. Patient does not recall exactly where the stent was placed and he cannot remember the exact month/year.   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 Patient denies any chest pain on exertion, but he does sometimes get short of breath on exertion. Requesting that stress test be ordered for DOT physical. Patient also reports occasional orthopnea and ankle edema. Has not been taking lasix. He has a history of atrial fibrillation and denies palpitations. Is compliant with his blood thinner. Denies bleeding or bruising   The patient reports occasional dizziness, which they attribute to adjusting to new bifocal glasses. They deny any episodes of syncope. The patient also reports discomfort in the left hip, similar  to the pain experienced prior to the stent placement. The pain is exacerbated by walking and is localized to the hip and thigh area, not the calf. The patient is a smoker and has made several unsuccessful attempts to quit.  ROS:  Patient denies chest pain, ankle edema, worsening shortness of breath, orthopnea, syncope, near syncope   Studies Reviewed: .   Cardiac Studies & Procedures   CARDIAC CATHETERIZATION  CARDIAC CATHETERIZATION 10/16/2018  Narrative  Previously placed Prox RCA to Mid RCA drug eluting stents are widely patent, with only mild restenosis.  Mid Cx lesion is 50% stenosed. Not significant by DFR.  Prox LAD to Mid LAD lesion is 25% stenosed.  LV end diastolic pressure is normal.  There is no aortic valve stenosis.  COntinue aggressive medical therapy.  Findings Coronary Findings Diagnostic  Dominance: Right  Left Anterior Descending Prox LAD to Mid LAD lesion is 25% stenosed.  Left Circumflex Mid Cx lesion is 50% stenosed. The lesion is eccentric. Pressure wire/FFR was performed on the lesion. DFR 0.94 DFR of circumflex with [place]et wire 0.94.  Right Coronary Artery Vessel is large. Ost RCA lesion is 35% stenosed. The lesion is tubular. Prox RCA lesion is 5% stenosed. The lesion is discrete and eccentric. The lesion was previously treated using a drug eluting stent and angioplasty between 2-30 days ago. Previously placed Prox RCA to Mid RCA drug eluting stent is widely patent. Previously placed Mid RCA drug eluting stent is widely patent. The lesion is segmental, concentric and irregular. The lesion is calcified. Tandem heavily calcified lesions in a roughly 85%.  Acute Marginal Branch Vessel is small in size.  First Right Posterolateral Branch Vessel is small in size. Vessel is angiographically normal.  Second Right Posterolateral Branch Vessel is moderate in size. Vessel is angiographically normal.  Third Right Posterolateral Branch Vessel is small in size.  Intervention  No interventions have been documented.   CARDIAC CATHETERIZATION  CARDIAC CATHETERIZATION 09/05/2016  Narrative Images from the original result were not included.   Prox RCA stent, 60 %stenosed.  Noncompliant balloon angioplasty performed for in-stent restenosis. Post intervention, there is a 5% residual stenosis.  _____________________________  Mid RCA lesion beyond the stent, 85 %stenosed. Tandem calcified lesions  Orbital Atherecomy using CSI Diamondback catheter was performed.  A STENT SYNERGY DES 3.5X32 drug eluting stent was successfully placed, and overlaps previously placed stent. (post-dilated to 3.8 mm, overlap was dilated to 4.1 mm)  Post intervention, there is a 0% residual stenosis.  Successful orbital atherectomy based PCI of the distal portion of the mid RCA overlapping the more proximal stent with another Synergy DES stent. This was preceded by repeat balloon angioplasty of the more proximal stent using a high pressure noncompliant balloon.  Plan:  Patient will return to [facility] postprocedure unit for sheath removal  Continue dual independent therapy  Smoking cessation counseling  Anticipate discharge tomorrow    Bryan Lemma, M.D., M.S. Interventional Cardiologist  Pager # [phone] Phone # [phone] [address] [place], [place] [address]  Findings Coronary Findings Diagnostic  Dominance: Right  Right Coronary Artery Vessel is large. The lesion is tubular. The lesion is discrete and eccentric. The lesion was previously treated using a drug eluting stent and angioplasty between 2-30 days ago. Previously placed Prox RCA to Mid RCA drug eluting stent is widely patent. The lesion is segmental, concentric and irregular. The lesion is calcified. Tandem heavily calcified lesions in a roughly 85%.  Acute Marginal Branch Vessel is  small in size.  First Right Posterolateral Branch Vessel is small in size. Vessel is angiographically normal.  Second Right Posterolateral Branch Vessel is moderate in size. Vessel is angiographically normal.  Third Right Posterolateral Branch Vessel is small in size.  Intervention  Prox RCA  lesion Angioplasty Lesion length: 8 mm. Lesion crossed with guidewire using a WIRE VIPER ADVANCE COR .012TIP. Angioplasty alone was performed using a BALLOON [place] MOZEC 4.0X8. Maximum pressure: 18 atm. Inflation time: 30 sec. The pre-interventional distal flow is normal (TIMI 3).  The post-interventional distal flow is normal (TIMI 3). The intervention was successful . No complications occurred at this lesion. GUIDE CATH MACH 1 43F AL.75 (Yes) There was recoil at the original angioplasty site. Therefore I decided to treat this area prior to orbital atherectomy distally. There is a 5% residual stenosis post intervention.  Prox RCA to Mid RCA lesion Angioplasty (Also treats lesions: Mid RCA) Lesion length: 25 mm. Lesion crossed with guidewire using a WIRE VIPER ADVANCE COR .012TIP. Pre-stent angioplasty was performed using a BALLOON MOZEC 3.0X14. Maximum pressure: 14 atm. Inflation time: 20 sec. Following CSI Atherectomy A STENT SYNERGY DES 3.5X32 drug eluting stent was successfully placed, and overlaps previously placed stent. Minimum lumen area: 3.8 mm. Stent strut is well apposed. Post-stent angioplasty was performed using a BALLOON [place] MOZEC 4.0X8. Maximum pressure: 16 atm. Inflation time: 30 sec. With stent balloon to high pressure. Then the [place] Balloon was used for the overlap site. The pre-interventional distal flow is normal (TIMI 3).  The intervention was successful . No complications occurred at this lesion. GUIDE CATH MACH 1 43F AL.75 (Yes) --&gt; CSI &amp; PTCA done over ViperWire, then DES over WIRE ASAHI PROWATER 180CM (Yes) There is a 0% residual stenosis post intervention.  Mid RCA lesion Angioplasty (Also treats lesions: Prox RCA to Mid RCA) See details in Prox RCA to Mid RCA lesion. Atherectomy Rotational atherectomy was performed using a CROWN DIAMONDBACK CLASSIC 1.25. Atherectomy device crossed the lesion. The intervention was successful. There is a 0% residual stenosis post  intervention.   STRESS TESTS  MYOCARDIAL PERFUSION IMAGING 03/06/2020  Interpretation Summary  Nuclear stress EF: 43%.  This is an intermediate risk study.  The left ventricular ejection fraction is moderately decreased (30-44%).  There was no ST segment deviation noted during stress.  Normal resting and stress perfusion. No ischemia or infarction EF LVE with global hypokinesis EF 43% Intermediate risk based on EF   ECHOCARDIOGRAM  ECHOCARDIOGRAM COMPLETE 03/06/2020  Narrative ECHOCARDIOGRAM REPORT    Patient Name:   ARVLE PRIDEAUX Date of Exam: 03/06/2020 Medical Rec #:  [id number]      Height:       71.0 in Accession #:    [id number]     Weight:       272.0 lb Date of Birth:  August 31, 1970      BSA:          2.403 m Patient Age:    49 years       BP:           124/76 mmHg Patient Gender: M              HR:           74 bpm. Exam Location:  [address]  Procedure: 2D Echo, Cardiac Doppler, Color Doppler and Intracardiac Opacification Agent  Indications:    I25.1 CAD  History:        Patient has prior history of Echocardiogram examinations, most  recent 08/30/2016. Cardiomyopathy, CAD, Arrythmias:Atrial Fibrillation, Signs/Symptoms:Shortness of Breath; Risk Factors:Hypertension, Diabetes, Dyslipidemia, Current Smoker and Obesity.  Sonographer:    Garald Braver, RDCS Referring Phys: [id number] Faustino Congress [name]  IMPRESSIONS   1. Poor acoustic windows Basal inferior/inferoseptal hypokinesis . Left ventricular ejection fraction, by estimation, is 50 to 55%). Left ventricular diastolic parameters are indeterminate. 2. Right ventricular systolic function is mildly reduced. The right ventricular size is mildly enlarged. 3. Right atrial size was mildly dilated. 4. The mitral valve is grossly normal. Mild mitral valve regurgitation. 5. The aortic valve is normal in structure. Aortic valve regurgitation is not visualized. 6. The inferior vena cava is normal in size with greater  than 50% respiratory variability, suggesting right atrial pressure of 3 mmHg.  Comparison(s): 08/30/16 EF 40-45%.  FINDINGS Left Ventricle: Poor acoustic windows Basal inferior/inferoseptal hypokinesis. Left ventricular ejection fraction, by estimation, is 50 to 55%. The left ventricle has low normal function. The left ventricle demonstrates regional wall motion abnormalities. The left ventricular internal cavity size was normal in size. There is no left ventricular hypertrophy. Left ventricular diastolic parameters are indeterminate.  Right Ventricle: The right ventricular size is mildly enlarged. Right vetricular wall thickness was not assessed. Right ventricular systolic function is mildly reduced.  Left Atrium: Left atrial size was normal in size.  Right Atrium: Right atrial size was mildly dilated.  Pericardium: There is no evidence of pericardial effusion.  Mitral Valve: The mitral valve is grossly normal. Mild mitral annular calcification. Mild mitral valve regurgitation.  Tricuspid Valve: The tricuspid valve is normal in structure. Tricuspid valve regurgitation is trivial.  Aortic Valve: The aortic valve is normal in structure. Aortic valve regurgitation is not visualized.  Pulmonic Valve: The pulmonic valve was normal in structure. Pulmonic valve regurgitation is mild.  Aorta: The aortic root is normal in size and structure.  Venous: The inferior vena cava is normal in size with greater than 50% respiratory variability, suggesting right atrial pressure of 3 mmHg.  IAS/Shunts: The interatrial septum was not assessed.   LEFT VENTRICLE PLAX 2D LVIDd:         5.10 cm LVIDs:         3.70 cm LV PW:         1.10 cm LV IVS:        1.10 cm LVOT diam:     2.40 cm LV SV:         86 LV SV Index:   36 LVOT Area:     4.52 cm   RIGHT VENTRICLE RV Basal diam:  4.10 cm RV S prime:     9.97 cm/s TAPSE (M-mode): 1.8 cm  LEFT ATRIUM             Index       RIGHT ATRIUM            Index LA diam:        4.70 cm 1.96 cm/m  RA Pressure: 3.00 mmHg LA Vol (A2C):   59.1 ml 24.60 ml/m RA Area:     22.70 cm LA Vol (A4C):   46.1 ml 19.18 ml/m RA Volume:   70.50 ml  29.34 ml/m LA Biplane Vol: 52.5 ml 21.85 ml/m AORTIC VALVE LVOT Vmax:   99.05 cm/s LVOT Vmean:  66.450 cm/s LVOT VTI:    0.190 m  AORTA Ao Root diam: 3.30 cm Ao Asc diam:  3.50 cm  TRICUSPID VALVE Estimated RAP:  3.00 mmHg  SHUNTS Systemic VTI:  0.19 m  Systemic Diam: 2.40 cm  Dietrich Pates MD Electronically signed by Dietrich Pates MD Signature Date/Time: 03/06/2020/10:56:14 PM    Final    MONITORS  CARDIAC EVENT MONITOR 03/31/2018  Narrative  Holter minitoring for total of 12 days.  Atrial fibrillation for 85% of monitoring time.  Frequent PACs.  Everage heart rate 88 BPM but frequent episodes of atrial fibrillation ith RVR.  Atrial fibrillation for 85% of monitoring time. Everage heart rate 88 BPM but frequent episodes of atrial fibrillation ith RVR.  Increase metoprolol to 50 mg po BID.           Risk Assessment/Calculations:    CHA2DS2-VASc Score = 4  This indicates a 4.8% annual risk of stroke. The patient's score is based upon: CHF History: 1 HTN History: 1 Diabetes History: 1 Stroke History: 0 Vascular Disease History: 1 Age Score: 0 Gender Score: 0    Physical Exam:   VS:  BP 123/82 (BP Location: Right Arm, Patient Position: Sitting, Cuff Size: Large)   Pulse (!) 103   Ht 5\' 11"  (1.803 m)   Wt 249 lb (112.9 kg)   BMI 34.73 kg/m    Wt Readings from Last 3 Encounters:  06/10/23 249 lb (112.9 kg)  06/09/23 257 lb (116.6 kg)  07/19/20 263 lb 3.2 oz (119.4 kg)    GEN: Well nourished, well developed in no acute distress. Sitting comfortably in the chair  NECK: No JVD CARDIAC: irregular rate and rhythm. no murmurs, rubs, gallops. Radial pulses 2+ bilaterally  RESPIRATORY:  Clear to auscultation without rales, wheezing or rhonchi  ABDOMEN: Soft, non-tender,  non-distended EXTREMITIES:  No edema; No deformity   ASSESSMENT AND PLAN: .    CAD - Previously underwent stenting of the mid RCA in 2016. In 2018, underwent scoring balloon angioplasty of the mid RCA for in-stent restenosis. Also underwent staged PCI of a second mid RCA lesion with orbital atherectomy and DES  - Most recent cath from 09/2018 showed patent prox-mid RCA stents, 50% stenosis in mid CX, 25% stenosis in prox-mid LAD  - Patient denies chest pain. He is due for DOT physical in 08/2023 and has not had a nuclear stress test completed since 2021. He requested nuclear stress test in preparation for DOT physical  - Ordered nuclear stress test - he is unable to walk on treadmill due to back and hip pain. Will use lexiscan as stress  - Patient has not been on ASA or plavix- with history of stenting to the RCA and peripheral artery stenting in the past few years (exact date unknown, but was in the past 2 years), recommended that patient resume plavix 75 mg daily  - Continue crestor 20 mg daily  - Continue metoprolol tartrate 100 mg BID   Longstanding Persistent Atrial Fibrillation  - Cardiac monitor in 2019 showed 85% afib burden  - Currently on metoprolol tartrate 100 mg BID. He denies palpitations, elevated HR at home   - EKG today showed atrial fibrillation, HR 103 BPM - Continue metoprolol tartrate 100 mg BID for HR control  - Continue eliquis 5 mg BID. Patient denies bleeding on eliquis. Hemoglobin 19.1 yesterday with PCP   PAD - Previously underwent left common iliac artery CTO intervention with stenting in 2021  - ABIs from 10/2020 normal bilaterally - Patient reports having a stent placed to his "left hip" blood vessel when living in [facility]. Records not available  - Ordered repeat ABIs and US aorta/IVC/Iliacs complete for monitoring  - Continue crestor  20 mg daily. Resume plavix as above   HTN  - BP well controlled  - Continue lisinopril 2.5 mg daily, metoprolol tartrate  100 mg BID - K 4.3, creatinine 0.86 yesterday with PCP   HLD  - Lipid panel yesterday showed LDL 68, Hdl 34, triglycerides 164, total cholesterol 126 - Continue crestor 20 mg daily   Ischemic Cardiomyopathy  Mild MR  DOE  - EF previously 40-45% in 2018. Most recent echo from 02/2020 showed EF 50-55%, mildly reduced RV function, mild MR - Patient reports dyspnea on exertion today. Ordered repeat echocardiogram  - Patient has not been on lasix. Reports that it was stopped by a provider in [place]. Reports occasional ankle edema, orthopnea, and dyspnea on exertion - Resume lasix 40 mg PRN for ankle edema, weight gain  - Continue metoprolol tartrate 100 mg BID, lisinopril 2.5 mg daily, farxiga 10 mg daily  - Creatinine 0.86 and K 4.3 yesterday   Tobacco Use  - Counseled patient on tobacco cessation   Informed Consent   Shared Decision Making/Informed Consent The risks [chest pain, shortness of breath, cardiac arrhythmias, dizziness, blood pressure fluctuations, myocardial infarction, stroke/transient ischemic attack, nausea, vomiting, allergic reaction, radiation exposure, metallic taste sensation and life-threatening complications (estimated to be 1 in 10,000)], benefits (risk stratification, diagnosing coronary artery disease, treatment guidance) and alternatives of a nuclear stress test were discussed in detail with Mr. Berkshire and he agrees to proceed.     Dispo: Follow up in 4-6 weeks   Signed, Jonita Albee, [name]

## 2023-06-09 ENCOUNTER — Encounter: Payer: Self-pay | Admitting: Family Medicine

## 2023-06-09 ENCOUNTER — Ambulatory Visit: Payer: BC Managed Care – PPO | Admitting: Family Medicine

## 2023-06-09 VITALS — BP 120/78 | HR 98 | Temp 97.7°F | Ht 71.0 in | Wt 257.0 lb

## 2023-06-09 DIAGNOSIS — E118 Type 2 diabetes mellitus with unspecified complications: Secondary | ICD-10-CM | POA: Diagnosis not present

## 2023-06-09 DIAGNOSIS — H1031 Unspecified acute conjunctivitis, right eye: Secondary | ICD-10-CM | POA: Diagnosis not present

## 2023-06-09 DIAGNOSIS — F17210 Nicotine dependence, cigarettes, uncomplicated: Secondary | ICD-10-CM | POA: Diagnosis not present

## 2023-06-09 DIAGNOSIS — I10 Essential (primary) hypertension: Secondary | ICD-10-CM

## 2023-06-09 DIAGNOSIS — Z0001 Encounter for general adult medical examination with abnormal findings: Secondary | ICD-10-CM

## 2023-06-09 DIAGNOSIS — J449 Chronic obstructive pulmonary disease, unspecified: Secondary | ICD-10-CM

## 2023-06-09 DIAGNOSIS — Z114 Encounter for screening for human immunodeficiency virus [HIV]: Secondary | ICD-10-CM

## 2023-06-09 DIAGNOSIS — Z1159 Encounter for screening for other viral diseases: Secondary | ICD-10-CM

## 2023-06-09 DIAGNOSIS — H1013 Acute atopic conjunctivitis, bilateral: Secondary | ICD-10-CM

## 2023-06-09 DIAGNOSIS — I480 Paroxysmal atrial fibrillation: Secondary | ICD-10-CM

## 2023-06-09 DIAGNOSIS — Z0000 Encounter for general adult medical examination without abnormal findings: Secondary | ICD-10-CM | POA: Insufficient documentation

## 2023-06-09 DIAGNOSIS — E782 Mixed hyperlipidemia: Secondary | ICD-10-CM | POA: Diagnosis not present

## 2023-06-09 MED ORDER — POLYMYXIN B-TRIMETHOPRIM 10000-0.1 UNIT/ML-% OP SOLN: 1.0000 [drp] | Freq: Four times a day (QID) | OPHTHALMIC | 0 refills | Status: AC

## 2023-06-09 MED ORDER — OLOPATADINE HCL 0.1 % OP SOLN: 1.0000 [drp] | Freq: Two times a day (BID) | OPHTHALMIC | 12 refills | Status: DC

## 2023-06-09 NOTE — Assessment & Plan Note (Signed)
Today your medical history was reviewed and routine physical exam with labs was performed. Recommend 150 minutes of moderate intensity exercise weekly and consuming a well-balanced diet. Advised to stop smoking if a smoker, avoid smoking if a non-smoker, limit alcohol consumption to 1 drink per day for women and 2 drinks per day for men, and avoid illicit drug use. Counseled on safe sex practices and offered STI testing today. Counseled in mental health awareness and when to seek medical care. Vaccine maintenance discussed. Appropriate health maintenance items reviewed. Return to office in 1 year for annual physical exam.  

## 2023-06-09 NOTE — Assessment & Plan Note (Signed)
3-5 minute discussion regarding the harms of tobacco use, the benefits of cessation, and methods of cessation. Discussed that there are medication options to help with cessation. Provided printed education on steps to quit smoking. He has tried wellbutrin, nicotine products, and chantix in the past. Would not like to try a medication at this time.

## 2023-06-09 NOTE — Assessment & Plan Note (Signed)
Chronic. Long history of smoking, COPD, lung nodules. Previously followed by [facility], will refer to establish locally. Counseled on risks of smoking and importance of tobacco cessation. He has tried wellbutrin, nicotine products, and chantix in the past. Would not like to try a medication at this time.

## 2023-06-09 NOTE — Progress Notes (Signed)
New Patient Office Visit  Subjective    Patient ID: Ian [name], male    DOB: 12-08-1970  Age: 52 y.o. MRN: [id number]  CC:  Chief Complaint  Patient presents with   Establish Care    HPI Ian [name] presents to establish care. Oriented to practice routines and expectations. No PCP since his move to [facility] in February 2024. He did have a cardiologist and pulmonologist. PMH as below. Last A1c on home test was 6.8% 4 weeks ago. He is seeing cardiology tomorrow to establish care locally. Concerns include right eye redness and yellow drainage for 2 days, crusting and stuck shut in AM. Denies vision changes, eye pain, foreign body, or trauma. Also endorses chronic red eyes and allergen exposure.  Colon CA screening: colonoscopy 1 years ago without abnormalities. Due 2030 Tobacco: smoker  (1-2 ppd x 37 yrs) STI: declines Vaccines:  declines  HYPERTENSION without Chronic Kidney Disease Hypertension status: controlled  Satisfied with current treatment? yes Duration of hypertension: chronic BP monitoring frequency:  not checking BP range:  BP medication side effects:  no Medication compliance: excellent compliance Previous BP meds:lisinopril and losartan (cozaar), metoprolol Aspirin: no Recurrent headaches: no Visual changes: no Palpitations: no Dyspnea:  baseline COPD Chest pain: no Lower extremity edema: no Dizzy/lightheaded: no  DIABETES Hypoglycemic episodes:no Polydipsia/polyuria: no Visual disturbance: no Chest pain: no Paresthesias: no Glucose Monitoring: no  Accucheck frequency: Not Checking  Fasting glucose:  Post prandial:  Evening:  Before meals: Taking Insulin?: no  Long acting insulin:  Short acting insulin: Blood Pressure Monitoring: not checking Retinal Examination: Up to Date Foot Exam: Not up to Date Diabetic Education: Completed Pneumovax: Not up to Date Influenza: Not up to Date Aspirin: no  COPD COPD status: controlled Satisfied with  current treatment?: yes Oxygen use: no Dyspnea frequency: with exercise Cough frequency: daily Rescue inhaler frequency: only with strenuous exercise Limitation of activity: yes Productive cough: no Last Spirometry: unknown Pneumovax:  declines Influenza:  declines   Outpatient Encounter Medications as of 06/09/2023  Medication Sig   albuterol (VENTOLIN HFA) 108 (90 Base) MCG/ACT inhaler Inhale 1-2 puffs into the lungs every 6 (six) hours as needed for wheezing or shortness of breath.    apixaban (ELIQUIS) 5 MG TABS tablet Take 1 tablet by mouth twice daily   Ascorbic Acid (VITAMIN C) 1000 MG tablet Take 1,000 mg by mouth daily.   Cinnamon 500 MG capsule Take 500 mg by mouth 2 (two) times daily.    dapagliflozin propanediol (FARXIGA) 5 MG TABS tablet Take 10 mg by mouth daily.   glipiZIDE (GLUCOTROL) 10 MG tablet Take 10 mg by mouth 2 (two) times daily before a meal.    lisinopril (ZESTRIL) 2.5 MG tablet Take 1 tablet (2.5 mg total) by mouth daily.   metFORMIN (GLUCOPHAGE) 500 MG tablet Take 1 tablet (500 mg total) by mouth 2 (two) times daily with a meal.   metoprolol tartrate (LOPRESSOR) 100 MG tablet Take 1 tablet (100 mg total) by mouth 2 (two) times daily.   nitroGLYCERIN (NITROSTAT) 0.4 MG SL tablet Place 1 tablet under the tongue every 5 (five) minutes as needed.   olopatadine (PATANOL) 0.1 % ophthalmic solution Place 1 drop into both eyes 2 (two) times daily.   Omega-3 Fatty Acids (FISH OIL) 1200 MG CAPS Take 2,000 mg by mouth daily. Pt takes 2 tabs daily   OVER THE COUNTER MEDICATION Beets supplement   rosuvastatin (CRESTOR) 20 MG tablet Take 1 tablet by mouth once daily  sildenafil (VIAGRA) 50 MG tablet Take 1 tablet (50 mg total) by mouth daily as needed for erectile dysfunction.   trimethoprim-polymyxin b (POLYTRIM) ophthalmic solution Place 1 drop into the right eye in the morning, at noon, in the evening, and at bedtime for 7 days.   [DISCONTINUED] losartan (COZAAR) 50 MG  tablet Take 50 mg by mouth daily.   [DISCONTINUED] clopidogrel (PLAVIX) 75 MG tablet TAKE 1 TABLET (75MG  TOTAL) BY MOUTH DAILY (Patient not taking: Reported on 06/09/2023)   [DISCONTINUED] furosemide (LASIX) 40 MG tablet TAKE 1 TABLET BY MOUTH TWICE DAILY (DOSE  INCREASE) (Patient not taking: Reported on 06/09/2023)   No facility-administered encounter medications on file as of 06/09/2023.    Past Medical History:  Diagnosis Date   Arthritis    "left ankle" (09/04/2016)   Cardiomyopathy (HCC)    a. EF 40-45% by prior echo. .b. 50-55% in 02/2020.   Complication of anesthesia    "hard to get me out of it sometimes; usually takes me longer to get under"   Coronary artery disease    a. multiple PCIs to RCA.   Essential hypertension 12/19/2015   Hyperlipidemia 05/21/2015   Hypertension    NSTEMI (non-ST elevated myocardial infarction) (HCC) 05/2015   [name] 05/23/2015 "minor one"   PAD (peripheral artery disease) (HCC)    PAF (paroxysmal atrial fibrillation) (HCC)    Tobacco abuse 05/21/2015   Type II diabetes mellitus (HCC)     Past Surgical History:  Procedure Laterality Date   ABDOMINAL AORTOGRAM W/LOWER EXTREMITY Bilateral 03/27/2020   Procedure: ABDOMINAL AORTOGRAM W/LOWER EXTREMITY;  Surgeon: Runell Gess, MD;  Location: [facility] INVASIVE CV LAB;  Service: Cardiovascular;  Laterality: Bilateral;   CARDIAC CATHETERIZATION  2000, 2005   "less than 20% blockage"   CARDIAC CATHETERIZATION N/A 05/22/2015   Procedure: Left Heart Cath and Coronary Angiography;  Surgeon: [name], MD;  Location: [facility] INVASIVE CV LAB;  Service: Cardiovascular;  Laterality: N/A;   CARDIAC CATHETERIZATION N/A 05/22/2015   Procedure: Coronary Stent Intervention;  Surgeon: [name], MD;  Location: [facility] INVASIVE CV LAB;  Service: Cardiovascular;  Laterality: N/A;   CARDIAC CATHETERIZATION N/A 09/04/2016   Procedure: Left Heart Cath and Coronary Angiography;  Surgeon: [name], MD;   Location: [facility] INVASIVE CV LAB;  Service: Cardiovascular;  Laterality: N/A;   CARDIAC CATHETERIZATION N/A 09/04/2016   Procedure: Coronary Balloon Angioplasty;  Surgeon: [name], MD;  Location: [facility] INVASIVE CV LAB;  Service: Cardiovascular;  Laterality: N/A;   CARDIAC CATHETERIZATION N/A 09/05/2016   Procedure: Coronary Atherectomy;  Surgeon: Marykay Lex, MD;  Location: [facility] INVASIVE CV LAB;  Service: Cardiovascular;  Laterality: N/A;   CARDIAC CATHETERIZATION N/A 09/05/2016   Procedure: Coronary Stent Intervention;  Surgeon: Marykay Lex, MD;  Location: [facility] INVASIVE CV LAB;  Service: Cardiovascular;  Laterality: N/A;   CARDIAC CATHETERIZATION N/A 09/05/2016   Procedure: Temporary Pacemaker;  Surgeon: Marykay Lex, MD;  Location: [facility] INVASIVE CV LAB;  Service: Cardiovascular;  Laterality: N/A;   CORONARY ANGIOPLASTY     CORONARY PRESSURE/FFR STUDY N/A 10/16/2018   Procedure: INTRAVASCULAR PRESSURE WIRE/FFR STUDY;  Surgeon: [name], MD;  Location: [facility] INVASIVE CV LAB;  Service: Cardiovascular;  Laterality: N/A;   LEFT HEART CATH AND CORONARY ANGIOGRAPHY N/A 10/16/2018   Procedure: LEFT HEART CATH AND CORONARY ANGIOGRAPHY;  Surgeon: [name], MD;  Location: [facility] INVASIVE CV LAB;  Service: Cardiovascular;  Laterality: N/A;   MULTIPLE TOOTH EXTRACTIONS  2013   "  all of them"   PERIPHERAL VASCULAR INTERVENTION  03/27/2020   Procedure: PERIPHERAL VASCULAR INTERVENTION;  Surgeon: Runell Gess, MD;  Location: [facility] INVASIVE CV LAB;  Service: Cardiovascular;;  Left iliac artery    TYMPANOSTOMY TUBE PLACEMENT Bilateral 1970s    Family History  Problem Relation Age of Onset   Diabetes Mother    Heart attack Father    Diabetes Sister    Diabetes Maternal Grandmother     Social History   Socioeconomic History   Marital status: Divorced    Spouse name: Not on file   Number of children: Not on file   Years of education: Not on file   Highest education level: Not on  file  Occupational History   Not on file  Tobacco Use   Smoking status: Every Day    Current packs/day: 2.00    Average packs/day: 2.0 packs/day for 35.0 years (70.0 ttl pk-yrs)    Types: Cigarettes   Smokeless tobacco: Never  Vaping Use   Vaping status: Never Used  Substance and Sexual Activity   Alcohol use: Yes    Alcohol/week: 0.0 standard drinks of alcohol    Comment: 09/04/2016  "might have a few drinks/year; nothing since 05/2015"   Drug use: Yes    Types: Cocaine, Marijuana    Comment: 09/04/2016 "haven't touched drugs since 1988"   Sexual activity: Yes  Other Topics Concern   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   Social Determinants of Health   Financial Resource Strain: Not on file  Food Insecurity: Not on file  Transportation Needs: Not on file  Physical Activity: Not on file  Stress: Not on file  Social Connections: Not on file  Intimate Partner Violence: Not on file    Review of Systems  Constitutional: Negative.   HENT: Negative.    Eyes:  Positive for discharge and redness.  Respiratory:  Positive for cough and shortness of breath.   Cardiovascular: Negative.   Gastrointestinal: Negative.   Genitourinary: Negative.   Musculoskeletal: Negative.   Skin: Negative.   Neurological: Negative.   Endo/Heme/Allergies: Negative.   Psychiatric/Behavioral: Negative.    All other systems reviewed and are negative.       Objective    BP 120/78   Pulse 98   Temp 97.7 F (36.5 C) (Oral)   Ht 5\' 11"  (1.803 m)   Wt 257 lb (116.6 kg)   SpO2 92%   BMI 35.84 kg/m   Physical Exam Vitals and nursing note reviewed.  Constitutional:      Appearance: Normal appearance. He is obese.  HENT:     Head: Normocephalic and atraumatic.     Right Ear: Ear canal and external ear normal. Tympanic membrane is scarred.     Left Ear: Ear canal and external ear normal. Tympanic membrane is scarred.     Nose:  Nose normal.     Mouth/Throat:     Mouth: Mucous membranes are moist.     Pharynx: Oropharynx is clear.  Eyes:     General:        Right eye: Discharge present.     Extraocular Movements: Extraocular movements intact.     Right eye: Normal extraocular motion and no nystagmus.     Left eye: Normal extraocular motion and no nystagmus.     Conjunctiva/sclera:     Right eye: Right conjunctiva is injected.     Left eye:  Left conjunctiva is injected.     Pupils: Pupils are equal, round, and reactive to light.  Cardiovascular:     Rate and Rhythm: Normal rate. Rhythm irregular.     Pulses: Normal pulses.     Heart sounds: Normal heart sounds.  Pulmonary:     Effort: Pulmonary effort is normal.     Breath sounds: Normal breath sounds. Decreased air movement present.  Abdominal:     General: Bowel sounds are normal.     Palpations: Abdomen is soft.  Genitourinary:    Comments: Deferred using shared decision making Musculoskeletal:        General: Normal range of motion.     Cervical back: Normal range of motion and neck supple.  Skin:    General: Skin is warm and dry.     Capillary Refill: Capillary refill takes less than 2 seconds.  Neurological:     General: No focal deficit present.     Mental Status: He is alert. Mental status is at baseline.  Psychiatric:        Mood and Affect: Mood normal.        Speech: Speech normal.        Behavior: Behavior normal.        Thought Content: Thought content normal.        Cognition and Memory: Cognition and memory normal.        Judgment: Judgment normal.         Assessment & Plan:   Problem List Items Addressed This Visit     Hyperlipidemia (Chronic)    Fasting labs today. Continue Crestor 20mg  daily. Your labs showed elevated cholesterol. I recommend consuming a heart healthy diet such as Mediterranean diet or DASH diet with whole grains, fruits, vegetable, fish, lean meats, nuts, and olive oil. Limit sweets and processed foods.  I also encourage moderate intensity exercise 150 minutes weekly. This is 3-5 times weekly for 30-50 minutes each session. Goal should be pace of 3 miles/hours, or walking 1.5 miles in 30 minutes. The ASCVD Risk score (Arnett DK, et al., 2019) failed to calculate for the following reasons:   The patient has a prior MI or stroke diagnosis       Relevant Medications   nitroGLYCERIN (NITROSTAT) 0.4 MG SL tablet   Other Relevant Orders   CBC with Differential/Platelet   COMPLETE METABOLIC PANEL WITH GFR   Lipid panel   AMB Referral VBCI Care Management   Type 2 diabetes mellitus with complication, without long-term current use of insulin (HCC) (Chronic)    Chronic. Checking fasting labs today. Recent vision screen. Continue Farxiga, Metformin, and Glipizide. Will refer to pharmacy for assistance with polypharmacy. He is ok with a weekly injectable.       Relevant Orders   CBC with Differential/Platelet   COMPLETE METABOLIC PANEL WITH GFR   Lipid panel   Hemoglobin A1c   Microalbumin / creatinine urine ratio   Vitamin B12   AMB Referral VBCI Care Management   PAF (paroxysmal atrial fibrillation) (HCC)    Irregular HR on exam today. Continue Eliquis and Metoprolol. Establishing with cardiology tomorrow.       Relevant Medications   nitroGLYCERIN (NITROSTAT) 0.4 MG SL tablet   Essential hypertension    Chronic well controlled. Continue Lisinopril 2.5mg  daily and Metoprolol 100mg  BID. Establishing with cardiology tomorrow. Fasting labs today. Recommend heart healthy diet such as Mediterranean diet with whole grains, fruits, vegetable, fish, lean meats, nuts, and olive oil. Limit salt.  Encouraged moderate walking, 3-5 times/week for 30-50 minutes each session. Aim for at least 150 minutes.week. Goal should be pace of 3 miles/hours, or walking 1.5 miles in 30 minutes. Avoid tobacco products. Avoid excess alcohol. Take medications as prescribed and bring medications and blood pressure log with  cuff to each office visit. Seek medical care for chest pain, palpitations, shortness of breath with exertion, dizziness/lightheadedness, vision changes, recurrent headaches, or swelling of extremities.       Relevant Medications   nitroGLYCERIN (NITROSTAT) 0.4 MG SL tablet   Other Relevant Orders   CBC with Differential/Platelet   COMPLETE METABOLIC PANEL WITH GFR   Lipid panel   AMB Referral VBCI Care Management   Chronic obstructive pulmonary disease (HCC)    Chronic. Long history of smoking, COPD, lung nodules. Previously followed by [facility], will refer to establish locally. Counseled on risks of smoking and importance of tobacco cessation. He has tried wellbutrin, nicotine products, and chantix in the past. Would not like to try a medication at this time.      Relevant Orders   Ambulatory referral to Pulmonology   Cigarette nicotine dependence without complication    3-5 minute discussion regarding the harms of tobacco use, the benefits of cessation, and methods of cessation. Discussed that there are medication options to help with cessation. Provided printed education on steps to quit smoking. He has tried wellbutrin, nicotine products, and chantix in the past. Would not like to try a medication at this time.       Relevant Orders   Ambulatory referral to Pulmonology   Ambulatory Referral for Lung Cancer Scre   Physical exam, annual - Primary    Today your medical history was reviewed and routine physical exam with labs was performed. Recommend 150 minutes of moderate intensity exercise weekly and consuming a well-balanced diet. Advised to stop smoking if a smoker, avoid smoking if a non-smoker, limit alcohol consumption to 1 drink per day for women and 2 drinks per day for men, and avoid illicit drug use. Counseled on safe sex practices and offered STI testing today. Counseled in mental health awareness and when to seek medical care. Vaccine maintenance discussed. Appropriate health  maintenance items reviewed. Return to office in 1 year for annual physical exam.       Relevant Orders   CBC with Differential/Platelet   COMPLETE METABOLIC PANEL WITH GFR   Lipid panel   Acute bacterial conjunctivitis of right eye    Patient presents with 2 days of purulent discharge in right eye, stuck shut in the morning with crusting. Symptoms consistent with right bacterial conjunctivitis. Also has chronic red eyes with allergen exposures to dust at his job. Will start Polytrim to right eye 4 times daily for 7 days and follow with daily Olopatadine for both eyes. Return to office if symptoms persist or worsen or for eye pain or vision changes.      Other Visit Diagnoses     Screening for HIV (human immunodeficiency virus)       Relevant Orders   HIV Antibody (routine testing w rflx)   Need for hepatitis C screening test       Relevant Orders   Hepatitis C antibody   Allergic conjunctivitis of both eyes           Return in about 3 months (around 09/09/2023) for chronic follow-up.   Park Meo, FNP

## 2023-06-09 NOTE — Assessment & Plan Note (Signed)
Fasting labs today. Continue Crestor 20mg  daily. Your labs showed elevated cholesterol. I recommend consuming a heart healthy diet such as Mediterranean diet or DASH diet with whole grains, fruits, vegetable, fish, lean meats, nuts, and olive oil. Limit sweets and processed foods. I also encourage moderate intensity exercise 150 minutes weekly. This is 3-5 times weekly for 30-50 minutes each session. Goal should be pace of 3 miles/hours, or walking 1.5 miles in 30 minutes. The ASCVD Risk score (Arnett DK, et al., 2019) failed to calculate for the following reasons:   The patient has a prior MI or stroke diagnosis

## 2023-06-09 NOTE — Assessment & Plan Note (Signed)
Irregular HR on exam today. Continue Eliquis and Metoprolol. Establishing with cardiology tomorrow.

## 2023-06-09 NOTE — Assessment & Plan Note (Signed)
Chronic. Checking fasting labs today. Recent vision screen. Continue Farxiga, Metformin, and Glipizide. Will refer to pharmacy for assistance with polypharmacy. He is ok with a weekly injectable.

## 2023-06-09 NOTE — Assessment & Plan Note (Signed)
Patient presents with 2 days of purulent discharge in right eye, stuck shut in the morning with crusting. Symptoms consistent with right bacterial conjunctivitis. Also has chronic red eyes with allergen exposures to dust at his job. Will start Polytrim to right eye 4 times daily for 7 days and follow with daily Olopatadine for both eyes. Return to office if symptoms persist or worsen or for eye pain or vision changes.

## 2023-06-09 NOTE — Assessment & Plan Note (Signed)
Chronic well controlled. Continue Lisinopril 2.5mg  daily and Metoprolol 100mg  BID. Establishing with cardiology tomorrow. Fasting labs today. Recommend heart healthy diet such as Mediterranean diet with whole grains, fruits, vegetable, fish, lean meats, nuts, and olive oil. Limit salt. Encouraged moderate walking, 3-5 times/week for 30-50 minutes each session. Aim for at least 150 minutes.week. Goal should be pace of 3 miles/hours, or walking 1.5 miles in 30 minutes. Avoid tobacco products. Avoid excess alcohol. Take medications as prescribed and bring medications and blood pressure log with cuff to each office visit. Seek medical care for chest pain, palpitations, shortness of breath with exertion, dizziness/lightheadedness, vision changes, recurrent headaches, or swelling of extremities.

## 2023-06-10 ENCOUNTER — Ambulatory Visit: Payer: BC Managed Care – PPO | Attending: Cardiology | Admitting: Cardiology

## 2023-06-10 ENCOUNTER — Other Ambulatory Visit: Payer: Self-pay | Admitting: Family Medicine

## 2023-06-10 ENCOUNTER — Telehealth: Payer: Self-pay

## 2023-06-10 ENCOUNTER — Encounter: Payer: Self-pay | Admitting: Cardiology

## 2023-06-10 VITALS — BP 123/82 | HR 103 | Ht 71.0 in | Wt 249.0 lb

## 2023-06-10 DIAGNOSIS — I255 Ischemic cardiomyopathy: Secondary | ICD-10-CM

## 2023-06-10 DIAGNOSIS — R0602 Shortness of breath: Secondary | ICD-10-CM | POA: Diagnosis not present

## 2023-06-10 DIAGNOSIS — I10 Essential (primary) hypertension: Secondary | ICD-10-CM | POA: Diagnosis not present

## 2023-06-10 DIAGNOSIS — E782 Mixed hyperlipidemia: Secondary | ICD-10-CM

## 2023-06-10 DIAGNOSIS — R0609 Other forms of dyspnea: Secondary | ICD-10-CM

## 2023-06-10 DIAGNOSIS — I739 Peripheral vascular disease, unspecified: Secondary | ICD-10-CM | POA: Diagnosis not present

## 2023-06-10 DIAGNOSIS — I4811 Longstanding persistent atrial fibrillation: Secondary | ICD-10-CM

## 2023-06-10 DIAGNOSIS — I2510 Atherosclerotic heart disease of native coronary artery without angina pectoris: Secondary | ICD-10-CM

## 2023-06-10 DIAGNOSIS — D582 Other hemoglobinopathies: Secondary | ICD-10-CM

## 2023-06-10 LAB — MICROALBUMIN / CREATININE URINE RATIO
Creatinine, Urine: 70 mg/dL (ref 20–320)
Microalb Creat Ratio: 413 mg/g{creat} — ABNORMAL HIGH (ref <30)
Microalb, Ur: 28.9 mg/dL

## 2023-06-10 MED ORDER — CLOPIDOGREL BISULFATE 75 MG PO TABS: 75.0000 mg | ORAL_TABLET | Freq: Every day | ORAL | 3 refills | Status: DC

## 2023-06-10 MED ORDER — FUROSEMIDE 40 MG PO TABS: ORAL_TABLET | ORAL | 3 refills | Status: DC

## 2023-06-10 NOTE — Patient Instructions (Signed)
Medication Instructions:  Start Clopidogrel 75 mg once a day Start Furosemide 40 mg as needed for weight gain of 3 lbs over night and/or 5 lbs over the week. *If you need a refill on your cardiac medications before your next appointment, please call your pharmacy*   Lab Work: No labs   Testing/Procedures: Your physician has requested that you have an Aorta/Iliac Duplex. This will be take place at [address], [address].  No food after 11PM the night before.  Water is OK. (Don't drink liquids if you have been instructed not to for ANOTHER test) Avoid foods that produce bowel gas, for 24 hours prior to exam (see below). No breakfast, no chewing gum, no smoking or carbonated beverages. Patient may take morning medications with water. Come in for test at least 15 minutes early to register.  Your physician has requested that you have an ankle brachial index (ABI). During this test an ultrasound and blood pressure cuff are used to evaluate the arteries that supply the arms and legs with blood. Allow thirty minutes for this exam. There are no restrictions or special instructions. This will take place at [address], [address].     Your physician has requested that you have an echocardiogram. Echocardiography is a painless test that uses sound waves to create images of your heart. It provides your doctor with information about the size and shape of your heart and how well your heart's chambers and valves are working. This procedure takes approximately one hour. There are no restrictions for this procedure. Please do NOT wear cologne, perfume, aftershave, or lotions (deodorant is allowed). Please arrive 15 minutes prior to your appointment time.    Your physician has requested that you have a lexiscan myoview. For further information please visit [email or web address] Please follow instruction sheet, as given. This will take place at [address]  How to prepare for  your Myocardial Perfusion Test: Do not eat or drink 3 hours prior to your test, except you may have water. Do not consume products containing caffeine (regular or decaffeinated) 12 hours prior to your test. (ex: coffee, chocolate, sodas, tea). Do bring a list of your current medications with you.  If not listed below, you may take your medications as normal. Do wear comfortable clothes (no dresses or overalls) and walking shoes, tennis shoes preferred (No heels or open toe shoes are allowed). Do NOT wear cologne, perfume, aftershave, or lotions (deodorant is allowed). The test will take approximately 3 to 4 hours to complete If these instructions are not followed, your test will have to be rescheduled.      Follow-Up: At [facility], you and your health needs are our priority.  As part of our continuing mission to provide you with exceptional heart care, we have created designated Provider Care Teams.  These Care Teams include your primary Cardiologist (physician) and Advanced Practice Providers (APPs -  Physician Assistants and Nurse Practitioners) who all work together to provide you with the care you need, when you need it.  We recommend signing up for the patient portal called "MyChart".  Sign up information is provided on this After Visit Summary.  MyChart is used to connect with patients for Virtual Visits (Telemedicine).  Patients are able to view lab/test results, encounter notes, upcoming appointments, etc.  Non-urgent messages can be sent to your provider as well.   To learn more about what you can do with MyChart, go to [email or web address].  Your next appointment:   4-6 week(s)  Provider:   Any APP

## 2023-06-10 NOTE — Progress Notes (Signed)
   Care Guide Note  06/10/2023 Name: Ian [name] MRN: [id number] DOB: 02-22-1971  Referred by: Ian Meo, Ian [name] Reason for referral : Care Coordination (Outreach to schedule with Pharm d )   Ian [name] is a 52 y.o. year old male who is a primary care patient of Ian Meo, Ian [name]. Ian [name] was referred to the pharmacist for assistance related to HTN, HLD, COPD, and DM.    Successful contact was made with the patient to discuss pharmacy services including being ready for the pharmacist to call at least 5 minutes before the scheduled appointment time, to have medication bottles and any blood sugar or blood pressure readings ready for review. The patient agreed to meet with the pharmacist via with the pharmacist via telephone visit on (date/time).  07/02/2023  Ian [name], Ian [name] Care Guide [facility]  [place], [place] [address] Direct Dial: [phone] [name]

## 2023-06-13 LAB — COMPLETE METABOLIC PANEL WITH GFR
AG Ratio: 1.8 (calc) (ref 1.0–2.5)
ALT: 17 U/L (ref 9–46)
AST: 18 U/L (ref 10–35)
Albumin: 4.6 g/dL (ref 3.6–5.1)
Alkaline phosphatase (APISO): 76 U/L (ref 35–144)
BUN: 16 mg/dL (ref 7–25)
CO2: 27 mmol/L (ref 20–32)
Calcium: 9.6 mg/dL (ref 8.6–10.3)
Chloride: 104 mmol/L (ref 98–110)
Creat: 0.86 mg/dL (ref 0.70–1.30)
Globulin: 2.6 g/dL (ref 1.9–3.7)
Glucose, Bld: 120 mg/dL — ABNORMAL HIGH (ref 65–99)
Potassium: 4.3 mmol/L (ref 3.5–5.3)
Sodium: 141 mmol/L (ref 135–146)
Total Bilirubin: 0.8 mg/dL (ref 0.2–1.2)
Total Protein: 7.2 g/dL (ref 6.1–8.1)
eGFR: 103 mL/min/{1.73_m2} (ref >=60)

## 2023-06-13 LAB — CBC WITH DIFFERENTIAL/PLATELET
Absolute Lymphocytes: 1961 {cells}/uL (ref 850–3900)
Absolute Monocytes: 562 {cells}/uL (ref 200–950)
Basophils Absolute: 59 {cells}/uL (ref 0–200)
Basophils Relative: 0.8 %
Eosinophils Absolute: 148 {cells}/uL (ref 15–500)
Eosinophils Relative: 2 %
HCT: 56.4 % — ABNORMAL HIGH (ref 38.5–50.0)
Hemoglobin: 19.1 g/dL — ABNORMAL HIGH (ref 13.2–17.1)
MCH: 32.8 pg (ref 27.0–33.0)
MCHC: 33.9 g/dL (ref 32.0–36.0)
MCV: 96.7 fL (ref 80.0–100.0)
MPV: 10.6 fL (ref 7.5–12.5)
Monocytes Relative: 7.6 %
Neutro Abs: 4669 {cells}/uL (ref 1500–7800)
Neutrophils Relative %: 63.1 %
Platelets: 158 10*3/uL (ref 140–400)
RBC: 5.83 10*6/uL — ABNORMAL HIGH (ref 4.20–5.80)
RDW: 13 % (ref 11.0–15.0)
Total Lymphocyte: 26.5 %
WBC: 7.4 10*3/uL (ref 3.8–10.8)

## 2023-06-13 LAB — HEPATITIS C ANTIBODY: Hepatitis C Ab: NONREACTIVE

## 2023-06-13 LAB — LIPID PANEL
Cholesterol: 126 mg/dL (ref <200)
HDL: 34 mg/dL — ABNORMAL LOW (ref >=40)
LDL Cholesterol (Calc): 68 mg/dL
Non-HDL Cholesterol (Calc): 92 mg/dL (ref <130)
Total CHOL/HDL Ratio: 3.7 (calc) (ref <5.0)
Triglycerides: 164 mg/dL — ABNORMAL HIGH (ref <150)

## 2023-06-13 LAB — TEST AUTHORIZATION

## 2023-06-13 LAB — HIV ANTIBODY (ROUTINE TESTING W REFLEX): HIV 1&2 Ab, 4th Generation: NONREACTIVE

## 2023-06-13 LAB — PATHOLOGIST SMEAR REVIEW

## 2023-06-13 LAB — VITAMIN B12: Vitamin B-12: 411 pg/mL (ref 200–1100)

## 2023-06-13 LAB — HEMOGLOBIN A1C
Hgb A1c MFr Bld: 8.9 %{Hb} — ABNORMAL HIGH (ref <5.7)
Mean Plasma Glucose: 209 mg/dL
eAG (mmol/L): 11.6 mmol/L

## 2023-06-13 LAB — ERYTHROPOIETIN: Erythropoietin: 17.6 m[IU]/mL (ref 2.6–18.5)

## 2023-06-26 ENCOUNTER — Telehealth: Payer: Self-pay

## 2023-06-26 NOTE — Telephone Encounter (Signed)
According to chart, last RF on Eliquis was 05/03/2021 and there is no documentation of the last RF on Farxiga. Thank you.   Copied from CRM [id number]. Topic: Clinical - Medication Refill >> Jun 25, 2023  4:15 PM Prudencio Pair wrote: Most Recent Primary Care Visit:  Provider: Kurtis Bushman S  Department: [facility]  Visit Type: NEW PATIENT 30  Date: 06/09/2023  Medication: Eliquis, Marcelline Deist  Has the patient contacted their pharmacy? Yes (Agent: If no, request that the patient contact the pharmacy for the refill. If patient does not wish to contact the pharmacy document the reason why and proceed with request.) (Agent: If yes, when and what did the pharmacy advise?)  Is this the correct pharmacy for this prescription? Yes If no, delete pharmacy and type the correct one.  This is the patient's preferred pharmacy:  [facility] [address] [place] - [address] [place] [address] [place] [address] Phone: [phone] Fax: [phone]   Has the prescription been filled recently? Yes  Is the patient out of the medication? Yes  Has the patient been seen for an appointment in the last year OR does the patient have an upcoming appointment? Yes  Can we respond through MyChart? No  Agent: Please be advised that Rx refills may take up to 3 business days. We ask that you follow-up with your pharmacy.

## 2023-06-27 ENCOUNTER — Telehealth: Payer: Self-pay

## 2023-06-27 NOTE — Telephone Encounter (Signed)
Spoke with patient. Explained LCS program. At time of call he was busy working and request call back next week.

## 2023-06-30 ENCOUNTER — Other Ambulatory Visit: Payer: Self-pay | Admitting: Family Medicine

## 2023-07-01 ENCOUNTER — Telehealth: Payer: Self-pay

## 2023-07-01 ENCOUNTER — Other Ambulatory Visit: Payer: Self-pay | Admitting: Family Medicine

## 2023-07-01 DIAGNOSIS — D582 Other hemoglobinopathies: Secondary | ICD-10-CM

## 2023-07-01 NOTE — Telephone Encounter (Signed)
Copied from CRM [id number]. Topic: Clinical - Prescription Issue >> Jul 01, 2023  4:07 PM Roswell Nickel wrote: Reason for CRM: Patient calling to follow up on prescription he called in  last week .he stating that  the pharmacy does not  have  the script.  Pt contact  info [phone]

## 2023-07-02 ENCOUNTER — Other Ambulatory Visit: Payer: Self-pay | Admitting: Pharmacist

## 2023-07-04 ENCOUNTER — Ambulatory Visit (HOSPITAL_COMMUNITY)
Admission: RE | Admit: 2023-07-04 | Discharge: 2023-07-04 | Disposition: A | Discharge status: Home / Self Care | Payer: Self-pay | Source: Ambulatory Visit | Attending: Cardiovascular Disease | Admitting: Cardiovascular Disease

## 2023-07-04 ENCOUNTER — Telehealth: Payer: Self-pay | Admitting: Cardiovascular Disease

## 2023-07-04 ENCOUNTER — Telehealth: Payer: Self-pay

## 2023-07-04 ENCOUNTER — Ambulatory Visit (HOSPITAL_BASED_OUTPATIENT_CLINIC_OR_DEPARTMENT_OTHER)
Admission: RE | Admit: 2023-07-04 | Discharge: 2023-07-04 | Disposition: A | Discharge status: Home / Self Care | Payer: Self-pay | Source: Ambulatory Visit | Attending: Cardiology | Admitting: Cardiology

## 2023-07-04 DIAGNOSIS — I739 Peripheral vascular disease, unspecified: Secondary | ICD-10-CM

## 2023-07-04 LAB — VAS US ABI WITH/WO TBI
Left ABI: 0.64
Right ABI: 1.04

## 2023-07-04 MED ORDER — LISINOPRIL 2.5 MG PO TABS: 2.5000 mg | ORAL_TABLET | Freq: Every day | ORAL | 0 refills | Status: DC

## 2023-07-04 MED ORDER — METFORMIN HCL 500 MG PO TABS: 500.0000 mg | ORAL_TABLET | Freq: Two times a day (BID) | ORAL | 0 refills | Status: DC

## 2023-07-04 MED ORDER — GLIPIZIDE 10 MG PO TABS: 10.0000 mg | ORAL_TABLET | Freq: Two times a day (BID) | ORAL | 0 refills | Status: DC

## 2023-07-04 NOTE — Telephone Encounter (Signed)
Left message for pt, according to the last office note from dr berry, the patient should continue both the plavix and eliquis.

## 2023-07-04 NOTE — Telephone Encounter (Signed)
Pt c/o medication issue:  1. Name of Medication:   apixaban (ELIQUIS) 5 MG TABS tablet    clopidogrel (PLAVIX) 75 MG tablet    2. How are you currently taking this medication (dosage and times per day)? As written  3. Are you having a reaction (difficulty breathing--STAT)? No   4. What is your medication issue? Pt's PCP told him to call and make sure he is suppose to be taking both medication

## 2023-07-04 NOTE — Addendum Note (Signed)
Addended by: Arta Silence on: 07/04/2023 03:01 PM   Modules accepted: Orders

## 2023-07-04 NOTE — Telephone Encounter (Signed)
Spoke w/pt re med refills. Pt also left information for his diabetic eye exam and put in the clinic mail box. Suggest pt to go home; look at his medications and call me back with the ones he needed refill.   Pt voiced understanding.

## 2023-07-07 ENCOUNTER — Telehealth: Payer: Self-pay | Admitting: Family Medicine

## 2023-07-07 ENCOUNTER — Other Ambulatory Visit: Payer: Self-pay

## 2023-07-07 MED ORDER — DAPAGLIFLOZIN PROPANEDIOL 5 MG PO TABS: 10.0000 mg | ORAL_TABLET | Freq: Every day | ORAL | 90 refills | Status: DC

## 2023-07-07 MED ORDER — ROSUVASTATIN CALCIUM 20 MG PO TABS: 20.0000 mg | ORAL_TABLET | Freq: Every day | ORAL | 0 refills | Status: DC

## 2023-07-07 NOTE — Telephone Encounter (Signed)
Outbound call placed to acknowledge paperwork patient sent (eye exam); stated he submitted it as requested and it's needed for provider to fill scripts of 5 of his meds (only 3 were filled); patient unsure of names of meds.  Please advise patient (unsure of dose of Metformin; stated it was dropped down from 1000 to 500).  Pharmacy confirmed as:  [facility] [address], [place] - [address] [place], [place] [address] Phone: [phone]  Fax: [phone]   Please advise at [phone].

## 2023-07-08 ENCOUNTER — Other Ambulatory Visit: Payer: Self-pay | Admitting: Family Medicine

## 2023-07-08 MED ORDER — METFORMIN HCL 500 MG PO TABS: 1000.0000 mg | ORAL_TABLET | Freq: Two times a day (BID) | ORAL | 0 refills | Status: DC

## 2023-07-09 ENCOUNTER — Encounter: Payer: Self-pay | Admitting: Family Medicine

## 2023-07-09 ENCOUNTER — Telehealth: Payer: Self-pay

## 2023-07-09 NOTE — Telephone Encounter (Signed)
-----   Message from Jonita Albee sent at 07/08/2023  8:03 AM EST ----- Please tell patient that his ultrasounds showed possible blockage in his left iliac stent. ABIs suggest moderate left lower extremity arterial diease as well.  Please arrange follow up with Dr. Allyson Sabal in the next 4-6 weeks (OK with Dr. Allyson Sabal, we already discussed)   Thanks Doristine Church

## 2023-07-09 NOTE — Telephone Encounter (Signed)
Called patient advised of below they verbalized understanding. Made appointment on 07/28/23

## 2023-07-14 ENCOUNTER — Other Ambulatory Visit: Payer: Self-pay | Admitting: Family Medicine

## 2023-07-14 ENCOUNTER — Encounter: Payer: Self-pay | Admitting: Family Medicine

## 2023-07-14 DIAGNOSIS — R809 Proteinuria, unspecified: Secondary | ICD-10-CM

## 2023-07-17 ENCOUNTER — Encounter (HOSPITAL_COMMUNITY): Payer: Self-pay

## 2023-07-17 NOTE — Telephone Encounter (Signed)
Poke with patient and he was given detailed instructions about his STRESS TEST on 07/18/23 at 10:00.

## 2023-07-18 ENCOUNTER — Ambulatory Visit (HOSPITAL_BASED_OUTPATIENT_CLINIC_OR_DEPARTMENT_OTHER): Payer: Self-pay

## 2023-07-18 ENCOUNTER — Ambulatory Visit: Payer: Self-pay | Admitting: Family Medicine

## 2023-07-18 ENCOUNTER — Encounter (HOSPITAL_COMMUNITY): Payer: Self-pay | Admitting: Emergency Medicine

## 2023-07-18 ENCOUNTER — Ambulatory Visit (HOSPITAL_COMMUNITY): Payer: Self-pay | Attending: Cardiology

## 2023-07-18 ENCOUNTER — Ambulatory Visit (HOSPITAL_COMMUNITY)
Admission: EM | Admit: 2023-07-18 | Discharge: 2023-07-18 | Disposition: A | Discharge status: Home / Self Care | Payer: Self-pay | Attending: Emergency Medicine | Admitting: Emergency Medicine

## 2023-07-18 DIAGNOSIS — M79601 Pain in right arm: Secondary | ICD-10-CM

## 2023-07-18 DIAGNOSIS — R0602 Shortness of breath: Secondary | ICD-10-CM | POA: Insufficient documentation

## 2023-07-18 DIAGNOSIS — I2510 Atherosclerotic heart disease of native coronary artery without angina pectoris: Secondary | ICD-10-CM | POA: Insufficient documentation

## 2023-07-18 DIAGNOSIS — M79631 Pain in right forearm: Secondary | ICD-10-CM

## 2023-07-18 LAB — MYOCARDIAL PERFUSION IMAGING
LV dias vol: 137 mL (ref 62–150)
LV sys vol: 94 mL
Nuc Stress EF: 32 %
Peak HR: 115 {beats}/min
Rest HR: 104 {beats}/min
Rest Nuclear Isotope Dose: 10.1 mCi
SDS: 0
SRS: 0
SSS: 0
ST Depression (mm): 0 mm
Stress Nuclear Isotope Dose: 1120 mCi
TID: 1.13

## 2023-07-18 LAB — COMPREHENSIVE METABOLIC PANEL
ALT: 20 U/L (ref 0–44)
AST: 24 U/L (ref 15–41)
Albumin: 4.1 g/dL (ref 3.5–5.0)
Alkaline Phosphatase: 57 U/L (ref 38–126)
Anion gap: 14 (ref 5–15)
BUN: 17 mg/dL (ref 6–20)
CO2: 24 mmol/L (ref 22–32)
Calcium: 9.7 mg/dL (ref 8.9–10.3)
Chloride: 101 mmol/L (ref 98–111)
Creatinine, Ser: 0.91 mg/dL (ref 0.61–1.24)
GFR, Estimated: >60 mL/min (ref >60)
Glucose, Bld: 111 mg/dL — ABNORMAL HIGH (ref 70–99)
Potassium: 4.2 mmol/L (ref 3.5–5.1)
Sodium: 139 mmol/L (ref 135–145)
Total Bilirubin: 1.2 mg/dL — ABNORMAL HIGH (ref <1.2)
Total Protein: 6.8 g/dL (ref 6.5–8.1)

## 2023-07-18 LAB — CBC WITH DIFFERENTIAL/PLATELET
Abs Immature Granulocytes: 0.03 10*3/uL (ref 0.00–0.07)
Basophils Absolute: 0.1 10*3/uL (ref 0.0–0.1)
Basophils Relative: 1 %
Eosinophils Absolute: 0.1 10*3/uL (ref 0.0–0.5)
Eosinophils Relative: 1 %
HCT: 55 % — ABNORMAL HIGH (ref 39.0–52.0)
Hemoglobin: 18.8 g/dL — ABNORMAL HIGH (ref 13.0–17.0)
Immature Granulocytes: 0 %
Lymphocytes Relative: 30 %
Lymphs Abs: 2.4 10*3/uL (ref 0.7–4.0)
MCH: 33.2 pg (ref 26.0–34.0)
MCHC: 34.2 g/dL (ref 30.0–36.0)
MCV: 97 fL (ref 80.0–100.0)
Monocytes Absolute: 0.7 10*3/uL (ref 0.1–1.0)
Monocytes Relative: 8 %
Neutro Abs: 4.8 10*3/uL (ref 1.7–7.7)
Neutrophils Relative %: 60 %
Platelets: 153 10*3/uL (ref 150–400)
RBC: 5.67 MIL/uL (ref 4.22–5.81)
RDW: 13.2 % (ref 11.5–15.5)
WBC: 8.1 10*3/uL (ref 4.0–10.5)
nRBC: 0 % (ref 0.0–0.2)

## 2023-07-18 LAB — ECHOCARDIOGRAM COMPLETE: S' Lateral: 3.8 cm

## 2023-07-18 LAB — CK: Total CK: 75 U/L (ref 49–397)

## 2023-07-18 MED ORDER — DEXAMETHASONE SODIUM PHOSPHATE 10 MG/ML IJ SOLN
10.0000 mg | Freq: Once | INTRAMUSCULAR | Status: AC
  Administered 2023-07-18: 10 mg via INTRAMUSCULAR

## 2023-07-18 MED ORDER — DEXAMETHASONE SODIUM PHOSPHATE 10 MG/ML IJ SOLN
INTRAMUSCULAR | Status: AC
  Filled 2023-07-18: qty 1

## 2023-07-18 MED ORDER — METHOCARBAMOL 500 MG PO TABS: 500.0000 mg | ORAL_TABLET | Freq: Two times a day (BID) | ORAL | 0 refills | Status: DC

## 2023-07-18 MED ORDER — TECHNETIUM TC 99M TETROFOSMIN IV KIT
32.0000 | PACK | Freq: Once | INTRAVENOUS | Status: AC | PRN
  Administered 2023-07-18: 32 via INTRAVENOUS

## 2023-07-18 MED ORDER — REGADENOSON 0.4 MG/5ML IV SOLN
0.4000 mg | Freq: Once | INTRAVENOUS | Status: AC
  Administered 2023-07-18: 0.4 mg via INTRAVENOUS

## 2023-07-18 MED ORDER — TECHNETIUM TC 99M TETROFOSMIN IV KIT
10.1000 | PACK | Freq: Once | INTRAVENOUS | Status: AC | PRN
  Administered 2023-07-18: 10.1 via INTRAVENOUS

## 2023-07-18 NOTE — Discharge Instructions (Addendum)
We are checking some basic lab work today and we will contact you if you need to follow-up with your primary care provider regarding your rosuvastatin.  Take the muscle relaxer as needed, do not drink or drive on this as it may cause drowsiness.  If the right arm pain persist please follow-up with your primary care provider and orthopedic for further evaluation.

## 2023-07-18 NOTE — Telephone Encounter (Signed)
   Chief Complaint: shoulder pain Symptoms: right shoulder pain going down arm, weakness in right arm Frequency: x 3 weeks Pertinent Negatives: Patient denies swelling, inability to move arm, fever, rash, numbness, chest pain Disposition: [] ED /[x] Urgent Care (no appt availability in office) / [] Appointment(In office/virtual)/ []  [facility]/ [] Home Care/ [] Refused Recommended Disposition /[] [facility]/ []  Follow-up with PCP Additional Notes: Patient called in and reports that he has been experiencing right shoulder and arm pain for the last 3 weeks. Patient reports that his right arm feels weaker then normal but that he thinks this is due to pain as he is still able to utilize it and carry necessary items. Per protocol this RN attempted to schedule patient appt in office. No available appts so this RN recommended UC. This RN advised patient to call back with worsening symptoms. Patient verbalized understanding and stated he would be going to UC now.    Copied from CRM [id number]. Topic: Appointments - Appointment Scheduling >> Jul 18, 2023 12:46 PM Theodis Sato wrote: PT states he has had shoulder pain radiating into his forearm and palm Reason for Disposition  Weakness (i.e., loss of strength) in hand or fingers  (Exception: Not truly weak; hand feels weak because of pain.)  Answer Assessment - Initial Assessment Questions 1. ONSET: "When did the pain start?"     3 weeks 2. LOCATION: "Where is the pain located?"     Right shoulder down arm to palm 3. PAIN: "How bad is the pain?" (Scale 1-10; or mild, moderate, severe)   - MILD (1-3): doesn't interfere with normal activities   - MODERATE (4-7): interferes with normal activities (e.g., work or school) or awakens from sleep   - SEVERE (8-10): excruciating pain, unable to do any normal activities, unable to move arm at all due to pain     severe 4. WORK OR EXERCISE: "Has there been any recent work or exercise that involved  this part of the body?"     no 5. CAUSE: "What do you think is causing the shoulder pain?"     I'm not sure this came on suddenly 6. OTHER SYMPTOMS: "Do you have any other symptoms?" (e.g., neck pain, swelling, rash, fever, numbness, weakness)     weakness  Protocols used: Shoulder Pain-A-AH

## 2023-07-18 NOTE — ED Provider Notes (Signed)
[facility]    CSN: [id number] Arrival date & time: 07/18/23  1315      History   Chief Complaint Chief Complaint  Patient presents with   Hand Pain    HPI Ian [name] is a 52 y.o. male.   Patient has been having right arm pain for the past month.  Initially the pain started in his shoulder it is moved to his bicep and tricep and down into his right forearm.  Intermittently he will have right hand numbness.  Reports the right forearm muscle is what hurts the most and it feels very tight.  No swelling to the area.  He did go for his stress test this morning at 10 AM.  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  He is on rosuvastatin, this is not a new medication.  No chest pain or shortness of breath.  He has tried wrapping the area, Aleeve, massage gun and topical pain gel without relief.      Hand Pain    Past Medical History:  Diagnosis Date   Arthritis    "left ankle" (09/04/2016)   Cardiomyopathy (HCC)    a. EF 40-45% by prior echo. .b. 50-55% in 02/2020.   Complication of anesthesia    "hard to get me out of it sometimes; usually takes me longer to get under"   Coronary artery disease    a. multiple PCIs to RCA.   Essential hypertension 12/19/2015   Hyperlipidemia 05/21/2015   Hypertension    NSTEMI (non-ST elevated myocardial infarction) (HCC) 05/2015   [name] 05/23/2015 "minor one"   PAD (peripheral artery disease) (HCC)    PAF (paroxysmal atrial fibrillation) (HCC)    Tobacco abuse 05/21/2015   Type II diabetes mellitus [facility])     Patient Active Problem List   Diagnosis Date Noted   Chronic obstructive pulmonary disease (HCC) 06/09/2023   Cigarette nicotine dependence without complication 06/09/2023   Physical exam, annual 06/09/2023   Acute bacterial conjunctivitis of right eye 06/09/2023   Claudication in peripheral vascular disease (HCC) 03/27/2020   Peripheral arterial disease (HCC)  03/15/2020   Dyspnea on exertion 10/27/2018   Chest pain 04/22/2017   Abnormal nuclear stress test 09/06/2016   S/P PTCA (percutaneous transluminal coronary angioplasty)    PAF (paroxysmal atrial fibrillation) (HCC) 12/19/2015   Essential hypertension 12/19/2015   Coronary artery disease due to lipid rich plaque 12/19/2015   Ischemic cardiomyopathy 12/19/2015   Hyperlipidemia 05/21/2015   Type 2 diabetes mellitus with complication, without long-term current use of insulin (HCC) 05/21/2015   Tobacco abuse 05/21/2015    Past Surgical History:  Procedure Laterality Date   ABDOMINAL AORTOGRAM W/LOWER EXTREMITY Bilateral 03/27/2020   Procedure: ABDOMINAL AORTOGRAM W/LOWER EXTREMITY;  Surgeon: Runell Gess, MD;  [facility]: [facility];  Service: Cardiovascular;  Laterality: Bilateral;   CARDIAC CATHETERIZATION  2000, 2005   "less than 20% blockage"   CARDIAC CATHETERIZATION N/A 05/22/2015   Procedure: Left Heart Cath and Coronary Angiography;  Surgeon: [name], MD;  Location: [facility] INVASIVE CV LAB;  Service: Cardiovascular;  Laterality: N/A;   CARDIAC CATHETERIZATION N/A 05/22/2015   Procedure: Coronary Stent Intervention;  Surgeon: [name], MD;  Location: [facility] INVASIVE CV LAB;  Service: Cardiovascular;  Laterality: N/A;   CARDIAC CATHETERIZATION N/A 09/04/2016   Procedure: Left Heart Cath and Coronary Angiography;  Surgeon: [name], MD;  [facility];  Service: Cardiovascular;  Laterality: N/A;   CARDIAC CATHETERIZATION N/A 09/04/2016   Procedure: Coronary Balloon Angioplasty;  Surgeon: [name], MD;  Location: [facility] INVASIVE CV LAB;  Service: Cardiovascular;  Laterality: N/A;   CARDIAC CATHETERIZATION N/A 09/05/2016   Procedure: Coronary Atherectomy;  Surgeon: Marykay Lex, MD;  Location: [facility] INVASIVE CV LAB;  Service: Cardiovascular;  Laterality: N/A;   CARDIAC CATHETERIZATION N/A 09/05/2016   Procedure: Coronary Stent  Intervention;  Surgeon: Marykay Lex, MD;  Location: [facility] INVASIVE CV LAB;  Service: Cardiovascular;  Laterality: N/A;   CARDIAC CATHETERIZATION N/A 09/05/2016   Procedure: Temporary Pacemaker;  Surgeon: Marykay Lex, MD;  Location: [facility] INVASIVE CV LAB;  Service: Cardiovascular;  Laterality: N/A;   CORONARY ANGIOPLASTY     CORONARY PRESSURE/FFR STUDY N/A 10/16/2018   Procedure: INTRAVASCULAR PRESSURE WIRE/FFR STUDY;  Surgeon: [name], MD;  Location: [facility] INVASIVE CV LAB;  Service: Cardiovascular;  Laterality: N/A;   LEFT HEART CATH AND CORONARY ANGIOGRAPHY N/A 10/16/2018   Procedure: LEFT HEART CATH AND CORONARY ANGIOGRAPHY;  Surgeon: [name], MD;  Location: [facility] INVASIVE CV LAB;  Service: Cardiovascular;  Laterality: N/A;   MULTIPLE TOOTH EXTRACTIONS  2013   "all of them"   PERIPHERAL VASCULAR INTERVENTION  03/27/2020   Procedure: PERIPHERAL VASCULAR INTERVENTION;  Surgeon: Runell Gess, MD;  [facility]: [facility];  Service: Cardiovascular;;  Left iliac artery    TYMPANOSTOMY TUBE PLACEMENT Bilateral 1970s       Home Medications    Prior to Admission medications   Medication Sig Start Date End Date Taking? Authorizing Provider  methocarbamol (ROBAXIN) 500 MG tablet Take 1 tablet (500 mg total) by mouth 2 (two) times daily. 07/18/23  Yes Rinaldo Ratel, [place], FNP  albuterol (VENTOLIN HFA) 108 (90 Base) MCG/ACT inhaler Inhale 1-2 puffs into the lungs every 6 (six) hours as needed for wheezing or shortness of breath.  03/03/19   [provider]  apixaban (ELIQUIS) 5 MG TABS tablet Take 1 tablet by mouth twice daily 04/13/21   Runell Gess, MD  Ascorbic Acid (VITAMIN C) 1000 MG tablet Take 1,000 mg by mouth daily.    [provider]  Cinnamon 500 MG capsule Take 500 mg by mouth 2 (two) times daily.     [provider]  clopidogrel (PLAVIX) 75 MG tablet Take 1 tablet (75 mg total) by mouth daily. 06/10/23   Jonita Albee, [name]   dapagliflozin propanediol (FARXIGA) 5 MG TABS tablet Take 2 tablets (10 mg total) by mouth daily. Take 10 mg by mouth daily. 07/07/23   Donita Brooks, MD  furosemide (LASIX) 40 MG tablet Only as needed for weight gain of 3 lbs over night and/or 5 lbs over the week. 06/10/23   Jonita Albee, [name]  glipiZIDE (GLUCOTROL) 10 MG tablet Take 1 tablet (10 mg total) by mouth 2 (two) times daily before a meal. Take 10 mg by mouth 2 (two) times daily before a meal. 07/04/23   Kurtis Bushman S, FNP  lisinopril (ZESTRIL) 2.5 MG tablet Take 1 tablet (2.5 mg total) by mouth daily. 07/04/23   Park Meo, FNP  metFORMIN (GLUCOPHAGE) 500 MG tablet Take 2 tablets (1,000 mg total) by mouth 2 (two) times daily with a meal. 07/08/23   Park Meo, FNP  metoprolol tartrate (LOPRESSOR) 100 MG tablet Take 1 tablet (100 mg total) by mouth 2 (two) times daily. 03/03/20   Tobias Alexander  H, MD  nitroGLYCERIN (NITROSTAT) 0.4 MG SL tablet Place 1 tablet under the tongue every 5 (five) minutes as needed. 10/02/18   [provider]  olopatadine (PATANOL) 0.1 % ophthalmic solution Place 1 drop into both eyes 2 (two) times daily. 06/09/23   Park Meo, FNP  Omega-3 Fatty Acids (FISH OIL) 1200 MG CAPS Take 2,000 mg by mouth daily. Pt takes 2 tabs daily    [provider]  OVER THE COUNTER MEDICATION Beets supplement    [provider]  rosuvastatin (CRESTOR) 20 MG tablet Take 1 tablet (20 mg total) by mouth daily. 07/07/23   Park Meo, FNP  sildenafil (VIAGRA) 50 MG tablet Take 1 tablet (50 mg total) by mouth daily as needed for erectile dysfunction. 07/20/19   [name], MD    Family History Family History  Problem Relation Age of Onset   Diabetes Mother    Heart attack Father    Diabetes Sister    Diabetes Maternal Grandmother     Social History Social History   Tobacco Use   Smoking status: Every Day    Current packs/day: 2.00    Average packs/day: 2.0  packs/day for 35.0 years (70.0 ttl pk-yrs)    Types: Cigarettes   Smokeless tobacco: Never   Tobacco comments:    Pt stated that he smokes  Vaping Use   Vaping status: Never Used  Substance Use Topics   Alcohol use: Yes    Alcohol/week: 0.0 standard drinks of alcohol    Comment: 09/04/2016  "might have a few drinks/year; nothing since 05/2015"   Drug use: Yes    Types: Cocaine, Marijuana    Comment: 09/04/2016 "haven't touched drugs since 1988"     Allergies   Atorvastatin   Review of Systems Review of Systems  Per HPI   Physical Exam Triage Vital Signs ED Triage Vitals  Encounter Vitals Group     BP 07/18/23 1425 (!) 141/92     Systolic BP Percentile --      Diastolic BP Percentile --      Pulse Rate 07/18/23 1425 79     Resp 07/18/23 1425 16     Temp 07/18/23 1425 (!) 97.4 F (36.3 C)     Temp Source 07/18/23 1425 Oral     SpO2 07/18/23 1425 95 %     Weight --      Height --      Head Circumference --      Peak Flow --      Pain Score 07/18/23 1428 7     Pain Loc --      Pain Education --      Exclude from Growth Chart --    No data found.  Updated Vital Signs BP (!) 141/92 (BP Location: Left Arm)   Pulse 79   Temp (!) 97.4 F (36.3 C) (Oral)   Resp 16   SpO2 95%   Visual Acuity Right Eye Distance:   Left Eye Distance:   Bilateral Distance:    Right Eye Near:   Left Eye Near:    Bilateral Near:     Physical Exam Vitals and nursing note reviewed.  Constitutional:      Appearance: Normal appearance.  HENT:     Head: Normocephalic and atraumatic.     Right Ear: External ear normal.     Left Ear: External ear normal.     Nose: Nose normal.     Mouth/Throat:  Mouth: Mucous membranes are moist.  Eyes:     Conjunctiva/sclera: Conjunctivae normal.  Cardiovascular:     Rate and Rhythm: Normal rate.     Pulses: Normal pulses.  Pulmonary:     Effort: Pulmonary effort is normal. No respiratory distress.  Musculoskeletal:        General:  Tenderness present. No swelling.     Right shoulder: Tenderness present. Decreased range of motion.     Right upper arm: Tenderness present.     Right forearm: Tenderness present.     Comments: Right-sided radial pulses 2+.  Brisk capillary refill in fingers, sensation intact in all fingers.  Strength 5 out of 5 in upper extremities.  No pain at the elbow.  Muscular pain to the right forearm, bicep, tricep and shoulder area to palpation.  No swelling.  No rashes.  Skin:    General: Skin is warm and dry.  Neurological:     General: No focal deficit present.     Mental Status: He is alert.  Psychiatric:        Mood and Affect: Mood normal.        Behavior: Behavior is cooperative.      UC Treatments / Results  Labs (all labs ordered are listed, but only abnormal results are displayed) Labs Reviewed  COMPREHENSIVE METABOLIC PANEL  CBC WITH DIFFERENTIAL/PLATELET  CK    EKG   Radiology ECHOCARDIOGRAM COMPLETE  Result Date: 07/18/2023    ECHOCARDIOGRAM REPORT   Patient Name:   Ian [name] Date of Exam: 07/18/2023 Medical Rec #:  [id number]      Height:       71.0 in Accession #:    [id number]     Weight:       249.0 lb Date of Birth:  August 07, 1971      BSA:          2.314 m Patient Age:    52 years       BP:           123/82 mmHg Patient Gender: M              HR:           93 bpm. Exam Location:  [address] Procedure: 2D Echo, Cardiac Doppler and Color Doppler Indications:    R06.02 Shortness of breath  History:        Patient has prior history of Echocardiogram examinations, most                 recent 02/15/2020. Cardiomyopathy, Arrythmias:Atrial Fibrillation;                 Risk Factors:Hypertension, Diabetes, Dyslipidemia and Current                 Smoker. NSTEMI.  Sonographer:    Daphine Deutscher RDCS Referring Phys: [id number] Jonita Albee IMPRESSIONS  1. Left ventricular ejection fraction, by estimation, is 35 to 40%. The left ventricle has moderately decreased function.  The left ventricle demonstrates global hypokinesis. There is mild left ventricular hypertrophy. Left ventricular diastolic parameters are indeterminate.  2. Right ventricular systolic function is normal. The right ventricular size is mildly enlarged. Tricuspid regurgitation signal is inadequate for assessing PA pressure.  3. The mitral valve is normal in structure. Trivial mitral valve regurgitation. No evidence of mitral stenosis.  4. The aortic valve was not well visualized. Aortic valve regurgitation is not visualized. No aortic stenosis is present.  5. The inferior vena cava  is normal in size with greater than 50% respiratory variability, suggesting right atrial pressure of 3 mmHg. FINDINGS  Left Ventricle: Left ventricular ejection fraction, by estimation, is 35 to 40%. The left ventricle has moderately decreased function. The left ventricle demonstrates global hypokinesis. The left ventricular internal cavity size was normal in size. There is mild left ventricular hypertrophy. Left ventricular diastolic parameters are indeterminate. Right Ventricle: The right ventricular size is mildly enlarged. No increase in right ventricular wall thickness. Right ventricular systolic function is normal. Tricuspid regurgitation signal is inadequate for assessing PA pressure. Left Atrium: Left atrial size was normal in size. Right Atrium: Right atrial size was normal in size. Pericardium: There is no evidence of pericardial effusion. Mitral Valve: The mitral valve is normal in structure. Trivial mitral valve regurgitation. No evidence of mitral valve stenosis. Tricuspid Valve: The tricuspid valve is normal in structure. Tricuspid valve regurgitation is trivial. Aortic Valve: The aortic valve was not well visualized. Aortic valve regurgitation is not visualized. No aortic stenosis is present. Pulmonic Valve: The pulmonic valve was not well visualized. Pulmonic valve regurgitation is trivial. Aorta: The aortic root and  ascending aorta are structurally normal, with no evidence of dilitation. Venous: The inferior vena cava is normal in size with greater than 50% respiratory variability, suggesting right atrial pressure of 3 mmHg. IAS/Shunts: The interatrial septum was not well visualized.  LEFT VENTRICLE PLAX 2D LVIDd:         4.60 cm LVIDs:         3.80 cm LV PW:         1.10 cm LV IVS:        1.10 cm LVOT diam:     2.30 cm LV SV:         47 LV SV Index:   20 LVOT Area:     4.15 cm  RIGHT VENTRICLE             IVC RV Basal diam:  4.10 cm     IVC diam: 1.90 cm RV S prime:     11.63 cm/s TAPSE (M-mode): 1.7 cm LEFT ATRIUM             Index        RIGHT ATRIUM           Index LA diam:        5.30 cm 2.29 cm/m   RA Area:     18.30 cm LA Vol (A2C):   49.1 ml 21.22 ml/m  RA Volume:   54.90 ml  23.72 ml/m LA Vol (A4C):   59.2 ml 25.58 ml/m LA Biplane Vol: 55.8 ml 24.11 ml/m  AORTIC VALVE LVOT Vmax:   72.30 cm/s LVOT Vmean:  47.833 cm/s LVOT VTI:    0.113 m  AORTA Ao Root diam: 3.50 cm Ao Asc diam:  3.50 cm MV E velocity: 100.62 cm/s                             SHUNTS                             Systemic VTI:  0.11 m                             Systemic Diam: 2.30 cm Epifanio Lesches MD Electronically signed by Epifanio Lesches MD Signature Date/Time: 07/18/2023/12:05:01  PM    Final     Procedures Procedures (including critical care time)  Medications Ordered in UC Medications  dexamethasone (DECADRON) injection 10 mg (has no administration in time range)    Initial Impression / Assessment and Plan / UC Course  I have reviewed the triage vital signs and the nursing notes.  Pertinent labs & imaging results that were available during my care of the patient were reviewed by me and considered in my medical decision making (see chart for details).  Vitals and triage reviewed, patient is hemodynamically stable.  Muscular tenderness to the right arm and limitation of range of motion due to muscular pain in shoulder.   Atraumatic.  Imaging deferred.  No swelling or skin changes.  No shortness of breath.  He is on Crestor, will check CBC, CMP and CK to rule out muscular pain related to statin use.  IM steroid injection given in clinic for pain and inflammation, will trial muscle relaxer.  Encouraged orthopedic / PCP follow-up if this persist.  Plan of care, follow-up care return precautions given, no questions at this time.     Final Clinical Impressions(s) / UC Diagnoses   Final diagnoses:  Right arm pain  Right forearm pain     Discharge Instructions      We are checking some basic lab work today and we will contact you if you need to follow-up with your primary care provider regarding your rosuvastatin.  Take the muscle relaxer as needed, do not drink or drive on this as it may cause drowsiness.  If the right arm pain persist please follow-up with your primary care provider and orthopedic for further evaluation.     ED Prescriptions     Medication Sig Dispense Auth. Provider   methocarbamol (ROBAXIN) 500 MG tablet Take 1 tablet (500 mg total) by mouth 2 (two) times daily. 20 tablet Constant [place], [place], [place]      PDMP not reviewed this encounter.   Pegeen Stiger, [place], [place] 07/18/23 [phone]

## 2023-07-18 NOTE — ED Triage Notes (Signed)
Pt c/o right arm pain that started in shoulder  month ago and has moved down arm all the way to his hand. States it feels very tight.

## 2023-07-19 ENCOUNTER — Inpatient Hospital Stay: Payer: Self-pay

## 2023-07-19 ENCOUNTER — Inpatient Hospital Stay: Payer: Self-pay | Attending: Hematology | Admitting: Hematology

## 2023-07-19 VITALS — BP 120/69 | HR 92 | Temp 97.9°F | Resp 20 | Wt 253.8 lb

## 2023-07-19 DIAGNOSIS — G4730 Sleep apnea, unspecified: Secondary | ICD-10-CM | POA: Insufficient documentation

## 2023-07-19 DIAGNOSIS — J449 Chronic obstructive pulmonary disease, unspecified: Secondary | ICD-10-CM | POA: Insufficient documentation

## 2023-07-19 DIAGNOSIS — F17210 Nicotine dependence, cigarettes, uncomplicated: Secondary | ICD-10-CM | POA: Insufficient documentation

## 2023-07-19 DIAGNOSIS — D751 Secondary polycythemia: Secondary | ICD-10-CM | POA: Insufficient documentation

## 2023-07-19 DIAGNOSIS — E119 Type 2 diabetes mellitus without complications: Secondary | ICD-10-CM | POA: Insufficient documentation

## 2023-07-19 NOTE — Progress Notes (Signed)
HEMATOLOGY/ONCOLOGY CONSULTATION NOTE  Date of Service: 07/19/2023  Patient Care Team: Park Meo, FNP as PCP - General (Family Medicine) Runell Gess, MD as PCP - Cardiology (Cardiology) Croitoru, Rachelle Hora, MD as Consulting Physician (Cardiology)  CHIEF COMPLAINTS/PURPOSE OF CONSULTATION:  Evaluation and management of elevated hemoglobin  HISTORY OF PRESENTING ILLNESS:   Ian [name] is a wonderful 52 y.o. male who has been referred to [place] by Park Meo, FNP for evaluation and management of elevated hemoglobin levels.   He reports a hx of NSTEMI in 2016. Patient also reports history of atrial fibrillation and notes that he has two stents placed in his heart and one in his right artery. He reports that his artery was previously 100% blocked and is currently partially blocked.   Patient received a nuclear stress test yesterday. [place] from two weeks ago showed that his stent in left common iliac was not welll visualized possible occlusion in sections of stent. Patient reports endorsing claudication on exertion. Patient reports leg swelling sometimes when he pushes his socks down.   In regards to his sleep apnea he reports that he generally uses a CPAP machine, but has not for the past month due to right upper extremity pain. He reports that the pain began in his shoulder, then forearm, then hand. He has received a steroid shot yesterday and was prescribed muscle relaxants. Stress test showed normal findings. He notes that he did not have any pain in his arm during his previous heart attack. Patient reports a history of lifting heavy objects in the past.   His sleep apnea has been managed by his pulmonologist in [place], [place]. He did have one sleep study previously, more than decade ago.   Patient reports that he continues to smoke 2 packs a day. He reports a hx of COPD, which is smoking related. Patient does use an inhaler occasionally as needed. He reports that he has previously  tried nicotine patches. Patient reports that he is unable to take Chantix. He reports that he has tried vaping for some time and felt like he was drowning.  Patient reports that he did previously quit smoking for some time. He notes that he gained some weight afterwards, from 250 pounds to 386 pounds. Patient reports that his weight at the time of his first sleep study was 275 pounds. His current weight is 253 pounds.   Patient reports a history of DM and is on medications for management including farxiga. He is on lasix as well. Patient reports that he is on a blood thinners at this time.   Patient was previously seen by a pulmonologist and was found to have lung nodules. He received a biopsy 1-2 years ago and results were benign.  Patient denies any abdominal pain. He denies any hx of blood clots in the past.   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   MEDICAL HISTORY:  Past Medical History:  Diagnosis Date   Arthritis    "left ankle" (09/04/2016)   Cardiomyopathy (HCC)    a. EF 40-45% by prior echo. .b. 50-55% in 02/2020.   Complication of anesthesia    "hard to get me out of it sometimes; usually takes me longer to get under"   Coronary artery disease    a. multiple PCIs to RCA.   Essential hypertension 12/19/2015   Hyperlipidemia 05/21/2015   Hypertension    NSTEMI (non-ST elevated myocardial infarction) (HCC) 05/2015   /  notes 05/23/2015 "minor one"   PAD (peripheral artery disease) (HCC)    PAF (paroxysmal atrial fibrillation) (HCC)    Tobacco abuse 05/21/2015   Type II diabetes mellitus (HCC)     SURGICAL HISTORY: Past Surgical History:  Procedure Laterality Date   ABDOMINAL AORTOGRAM W/LOWER EXTREMITY Bilateral 03/27/2020   Procedure: ABDOMINAL AORTOGRAM W/LOWER EXTREMITY;  Surgeon: Runell Gess, MD;  Location: [facility] INVASIVE CV LAB;  Service: Cardiovascular;  Laterality: Bilateral;   CARDIAC CATHETERIZATION  2000, 2005   "less  than 20% blockage"   CARDIAC CATHETERIZATION N/A 05/22/2015   Procedure: Left Heart Cath and Coronary Angiography;  Surgeon: [name], MD;  Location: [facility] INVASIVE CV LAB;  Service: Cardiovascular;  Laterality: N/A;   CARDIAC CATHETERIZATION N/A 05/22/2015   Procedure: Coronary Stent Intervention;  Surgeon: [name], MD;  Location: [facility] INVASIVE CV LAB;  Service: Cardiovascular;  Laterality: N/A;   CARDIAC CATHETERIZATION N/A 09/04/2016   Procedure: Left Heart Cath and Coronary Angiography;  Surgeon: [name], MD;  Location: [facility] INVASIVE CV LAB;  Service: Cardiovascular;  Laterality: N/A;   CARDIAC CATHETERIZATION N/A 09/04/2016   Procedure: Coronary Balloon Angioplasty;  Surgeon: [name], MD;  Location: [facility] INVASIVE CV LAB;  Service: Cardiovascular;  Laterality: N/A;   CARDIAC CATHETERIZATION N/A 09/05/2016   Procedure: Coronary Atherectomy;  Surgeon: Marykay Lex, MD;  Location: [facility] INVASIVE CV LAB;  Service: Cardiovascular;  Laterality: N/A;   CARDIAC CATHETERIZATION N/A 09/05/2016   Procedure: Coronary Stent Intervention;  Surgeon: Marykay Lex, MD;  Location: [facility] INVASIVE CV LAB;  Service: Cardiovascular;  Laterality: N/A;   CARDIAC CATHETERIZATION N/A 09/05/2016   Procedure: Temporary Pacemaker;  Surgeon: Marykay Lex, MD;  Location: [facility] INVASIVE CV LAB;  Service: Cardiovascular;  Laterality: N/A;   CORONARY ANGIOPLASTY     CORONARY PRESSURE/FFR STUDY N/A 10/16/2018   Procedure: INTRAVASCULAR PRESSURE WIRE/FFR STUDY;  Surgeon: [name], MD;  Location: [facility] INVASIVE CV LAB;  Service: Cardiovascular;  Laterality: N/A;   LEFT HEART CATH AND CORONARY ANGIOGRAPHY N/A 10/16/2018   Procedure: LEFT HEART CATH AND CORONARY ANGIOGRAPHY;  Surgeon: [name], MD;  Location: [facility] INVASIVE CV LAB;  Service: Cardiovascular;  Laterality: N/A;   MULTIPLE TOOTH EXTRACTIONS  2013   "all of them"   PERIPHERAL VASCULAR INTERVENTION  03/27/2020    Procedure: PERIPHERAL VASCULAR INTERVENTION;  Surgeon: Runell Gess, MD;  Location: [facility] INVASIVE CV LAB;  Service: Cardiovascular;;  Left iliac artery    TYMPANOSTOMY TUBE PLACEMENT Bilateral 1970s    SOCIAL HISTORY: Social History   Socioeconomic History   Marital status: Divorced    Spouse name: Not on file   Number of children: Not on file   Years of education: Not on file   Highest education level: Not on file  Occupational History   Not on file  Tobacco Use   Smoking status: Every Day    Current packs/day: 2.00    Average packs/day: 2.0 packs/day for 35.0 years (70.0 ttl pk-yrs)    Types: Cigarettes   Smokeless tobacco: Never   Tobacco comments:    Pt stated that he smokes  Vaping Use   Vaping status: Never Used  Substance and Sexual Activity   Alcohol use: Yes    Alcohol/week: 0.0 standard drinks of alcohol    Comment: 09/04/2016  "might have a few drinks/year; nothing since 05/2015"   Drug use: Yes    Types: Cocaine, Marijuana    Comment: 09/04/2016 "haven't touched  drugs since 1988"   Sexual activity: Yes  Other Topics Concern   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   Social Determinants of Health   Financial Resource Strain: Not on file  Food Insecurity: Not on file  Transportation Needs: Not on file  Physical Activity: Not on file  Stress: Not on file  Social Connections: Not on file  Intimate Partner Violence: Not on file    FAMILY HISTORY: Family History  Problem Relation Age of Onset   Diabetes Mother    Heart attack Father    Diabetes Sister    Diabetes Maternal Grandmother     ALLERGIES:  is allergic to atorvastatin.  MEDICATIONS:  Current Outpatient Medications  Medication Sig Dispense Refill   albuterol (VENTOLIN HFA) 108 (90 Base) MCG/ACT inhaler Inhale 1-2 puffs into the lungs every 6 (six) hours as needed for wheezing or shortness of breath.      apixaban (ELIQUIS) 5 MG  TABS tablet Take 1 tablet by mouth twice daily 180 tablet 1   Ascorbic Acid (VITAMIN C) 1000 MG tablet Take 1,000 mg by mouth daily.     Cinnamon 500 MG capsule Take 500 mg by mouth 2 (two) times daily.      clopidogrel (PLAVIX) 75 MG tablet Take 1 tablet (75 mg total) by mouth daily. 90 tablet 3   dapagliflozin propanediol (FARXIGA) 5 MG TABS tablet Take 2 tablets (10 mg total) by mouth daily. Take 10 mg by mouth daily. 30 tablet 90   furosemide (LASIX) 40 MG tablet Only as needed for weight gain of 3 lbs over night and/or 5 lbs over the week. 90 tablet 3   glipiZIDE (GLUCOTROL) 10 MG tablet Take 1 tablet (10 mg total) by mouth 2 (two) times daily before a meal. Take 10 mg by mouth 2 (two) times daily before a meal. 90 tablet 0   lisinopril (ZESTRIL) 2.5 MG tablet Take 1 tablet (2.5 mg total) by mouth daily. 90 tablet 0   metFORMIN (GLUCOPHAGE) 500 MG tablet Take 2 tablets (1,000 mg total) by mouth 2 (two) times daily with a meal. 90 tablet 0   methocarbamol (ROBAXIN) 500 MG tablet Take 1 tablet (500 mg total) by mouth 2 (two) times daily. 20 tablet 0   metoprolol tartrate (LOPRESSOR) 100 MG tablet Take 1 tablet (100 mg total) by mouth 2 (two) times daily. 180 tablet 3   nitroGLYCERIN (NITROSTAT) 0.4 MG SL tablet Place 1 tablet under the tongue every 5 (five) minutes as needed.     olopatadine (PATANOL) 0.1 % ophthalmic solution Place 1 drop into both eyes 2 (two) times daily. 5 mL 12   Omega-3 Fatty Acids (FISH OIL) 1200 MG CAPS Take 2,000 mg by mouth daily. Pt takes 2 tabs daily     OVER THE COUNTER MEDICATION Beets supplement     rosuvastatin (CRESTOR) 20 MG tablet Take 1 tablet (20 mg total) by mouth daily. 90 tablet 0   sildenafil (VIAGRA) 50 MG tablet Take 1 tablet (50 mg total) by mouth daily as needed for erectile dysfunction. 10 tablet 6   No current facility-administered medications for this visit.    REVIEW OF SYSTEMS:    10 Point review of Systems was done is negative except as  noted above.  PHYSICAL EXAMINATION: ECOG PERFORMANCE STATUS: 1 - Symptomatic but completely ambulatory  . Vitals:   07/19/23 0810  BP: 120/69  Pulse: 92  Resp:  20  Temp: 97.9 F (36.6 C)  SpO2: 98%   Filed Weights   07/19/23 0810  Weight: 253 lb 12.8 oz (115.1 kg)   .Body mass index is 35.4 kg/m.  GENERAL:alert, in no acute distress and comfortable SKIN: no acute rashes, no significant lesions EYES: conjunctiva are pink and non-injected, sclera anicteric OROPHARYNX: MMM, no exudates, no oropharyngeal erythema or ulceration NECK: supple, no JVD LYMPH:  no palpable lymphadenopathy in the cervical, axillary or inguinal regions LUNGS: clear to auscultation b/l with normal respiratory effort HEART: regular rate & rhythm ABDOMEN:  normoactive bowel sounds , non tender, not distended.no palpable hepatosplenomegaly Extremity: no pedal edema PSYCH: alert & oriented x 3 with fluent speech NEURO: no focal motor/sensory deficits  LABORATORY DATA:  I have reviewed the data as listed  .    Latest Ref Rng & Units 07/18/2023    2:54 PM 06/09/2023    9:32 AM 03/28/2020    2:47 AM  CBC  WBC 4.0 - 10.5 K/uL 8.1  7.4  7.7   Hemoglobin 13.0 - 17.0 g/dL 16.1  09.6  04.5   Hematocrit 39.0 - 52.0 % 55.0  56.4  51.5   Platelets 150 - 400 K/uL 153  158  128     .    Latest Ref Rng & Units 07/18/2023    2:54 PM 06/09/2023    9:32 AM 03/28/2020    2:47 AM  CMP  Glucose 70 - 99 mg/dL 409  811  914   BUN 6 - 20 mg/dL 17  16  12    Creatinine 0.61 - 1.24 mg/dL 7.82  9.56  2.13   Sodium 135 - 145 mmol/L 139  141  139   Potassium 3.5 - 5.1 mmol/L 4.2  4.3  4.3   Chloride 98 - 111 mmol/L 101  104  102   CO2 22 - 32 mmol/L 24  27  27    Calcium 8.9 - 10.3 mg/dL 9.7  9.6  9.2   Total Protein 6.5 - 8.1 g/dL 6.8  7.2    Total Bilirubin <1.2 mg/dL 1.2  0.8    Alkaline Phos 38 - 126 U/L 57     AST 15 - 41 U/L 24  18    ALT 0 - 44 U/L 20  17       RADIOGRAPHIC STUDIES: I have personally  reviewed the radiological images as listed and agreed with the findings in the report. MYOCARDIAL PERFUSION IMAGING  Result Date: 07/18/2023   Findings are consistent with no ischemia. The study is intermediate risk due to depressed systolic function.   No ST deviation was noted.   LV perfusion is normal.   Left ventricular function is abnormal. Global function is moderately reduced. Nuclear stress EF: 32%. The left ventricular ejection fraction is moderately decreased (30-44%). End diastolic cavity size is mildly enlarged. End systolic cavity size is mildly enlarged.   Prior study available for comparison from 03/06/2020. There are changes compared to prior study. The left ventricular ejection fraction has decreased.   Electronically signed by Thurmon Fair, MD Intermediate risk stress nuclear study with normal perfusion and moderately reduced left ventricular global systolic function. Left ventricular function has decreased from 43% to 32% compared to 2021. In the setting of atrial fibrillation, calculation of left ventricular ejection fraction can be erroneous. Correlate with echocardiography.  ECHOCARDIOGRAM COMPLETE  Result Date: 07/18/2023    ECHOCARDIOGRAM REPORT   Patient Name:   Ian [name] Date of Exam: 07/18/2023  Medical Rec #:  [id number]      Height:       71.0 in Accession #:    [id number]     Weight:       249.0 lb Date of Birth:  Dec 24, 1970      BSA:          2.314 m Patient Age:    52 years       BP:           123/82 mmHg Patient Gender: M              HR:           93 bpm. Exam Location:  [address] Procedure: 2D Echo, Cardiac Doppler and Color Doppler Indications:    R06.02 Shortness of breath  History:        Patient has prior history of Echocardiogram examinations, most                 recent 02/15/2020. Cardiomyopathy, Arrythmias:Atrial Fibrillation;                 Risk Factors:Hypertension, Diabetes, Dyslipidemia and Current                 Smoker. NSTEMI.  Sonographer:    Daphine Deutscher RDCS Referring Phys: [id number] Jonita Albee IMPRESSIONS  1. Left ventricular ejection fraction, by estimation, is 35 to 40%. The left ventricle has moderately decreased function. The left ventricle demonstrates global hypokinesis. There is mild left ventricular hypertrophy. Left ventricular diastolic parameters are indeterminate.  2. Right ventricular systolic function is normal. The right ventricular size is mildly enlarged. Tricuspid regurgitation signal is inadequate for assessing PA pressure.  3. The mitral valve is normal in structure. Trivial mitral valve regurgitation. No evidence of mitral stenosis.  4. The aortic valve was not well visualized. Aortic valve regurgitation is not visualized. No aortic stenosis is present.  5. The inferior vena cava is normal in size with greater than 50% respiratory variability, suggesting right atrial pressure of 3 mmHg. FINDINGS  Left Ventricle: Left ventricular ejection fraction, by estimation, is 35 to 40%. The left ventricle has moderately decreased function. The left ventricle demonstrates global hypokinesis. The left ventricular internal cavity size was normal in size. There is mild left ventricular hypertrophy. Left ventricular diastolic parameters are indeterminate. Right Ventricle: The right ventricular size is mildly enlarged. No increase in right ventricular wall thickness. Right ventricular systolic function is normal. Tricuspid regurgitation signal is inadequate for assessing PA pressure. Left Atrium: Left atrial size was normal in size. Right Atrium: Right atrial size was normal in size. Pericardium: There is no evidence of pericardial effusion. Mitral Valve: The mitral valve is normal in structure. Trivial mitral valve regurgitation. No evidence of mitral valve stenosis. Tricuspid Valve: The tricuspid valve is normal in structure. Tricuspid valve regurgitation is trivial. Aortic Valve: The aortic valve was not well visualized. Aortic valve  regurgitation is not visualized. No aortic stenosis is present. Pulmonic Valve: The pulmonic valve was not well visualized. Pulmonic valve regurgitation is trivial. Aorta: The aortic root and ascending aorta are structurally normal, with no evidence of dilitation. Venous: The inferior vena cava is normal in size with greater than 50% respiratory variability, suggesting right atrial pressure of 3 mmHg. IAS/Shunts: The interatrial septum was not well visualized.  LEFT VENTRICLE PLAX 2D LVIDd:         4.60 cm LVIDs:         3.80 cm LV PW:  1.10 cm LV IVS:        1.10 cm LVOT diam:     2.30 cm LV SV:         47 LV SV Index:   20 LVOT Area:     4.15 cm  RIGHT VENTRICLE             IVC RV Basal diam:  4.10 cm     IVC diam: 1.90 cm RV S prime:     11.63 cm/s TAPSE (M-mode): 1.7 cm LEFT ATRIUM             Index        RIGHT ATRIUM           Index LA diam:        5.30 cm 2.29 cm/m   RA Area:     18.30 cm LA Vol (A2C):   49.1 ml 21.22 ml/m  RA Volume:   54.90 ml  23.72 ml/m LA Vol (A4C):   59.2 ml 25.58 ml/m LA Biplane Vol: 55.8 ml 24.11 ml/m  AORTIC VALVE LVOT Vmax:   72.30 cm/s LVOT Vmean:  47.833 cm/s LVOT VTI:    0.113 m  AORTA Ao Root diam: 3.50 cm Ao Asc diam:  3.50 cm MV E velocity: 100.62 cm/s                             SHUNTS                             Systemic VTI:  0.11 m                             Systemic Diam: 2.30 cm Epifanio Lesches MD Electronically signed by Epifanio Lesches MD Signature Date/Time: 07/18/2023/12:05:01 PM    Final    VAS US AORTA/IVC/ILIACS  Result Date: 07/04/2023 ABDOMINAL AORTA STUDY Patient Name:  Ian [name]  Date of Exam:   07/04/2023 Medical Rec #: [id number]       Accession #:    [id number] Date of Birth: Nov 19, 1970       Patient Gender: M Patient Age:   89 years Exam Location:  [facility] Procedure:      VAS US AORTA/IVC/ILIACS Referring Phys: Robet Leu --------------------------------------------------------------------------------  Indications:  Patient reports for the past six months pain in his left hip that              radiates down left leg. Patient sometimes stops walking to              alleviate pain but tries to walk through it. Risk Factors: Hypertension, hyperlipidemia, Diabetes, current smoker, prior MI,               coronary artery disease. Vascular Interventions: Left common iliac artery PTA and stenting of an                         occlusion by Dr.Berry on 03/27/2020. Limitations: Air/bowel gas, obesity and patient discomfort.  Comparison Study: Prior aorta-iliac showed low end > 50% stenosis in left CIA                   proximal stent. Performing Technologist: Jeryl Columbia RDCS  Examination Guidelines: A complete evaluation includes B-mode imaging, spectral Doppler, color Doppler, and power Doppler as needed of  all accessible portions of each vessel. Bilateral testing is considered an integral part of a complete examination. Limited examinations for reoccurring indications may be performed as noted.  Abdominal Aorta Findings: +-------------+-------+----------+----------+----------+--------+--------+ Location     AP (cm)Trans (cm)PSV (cm/s)Waveform  ThrombusComments +-------------+-------+----------+----------+----------+--------+--------+ Proximal                      48                                   +-------------+-------+----------+----------+----------+--------+--------+ Mid                           42        biphasic                   +-------------+-------+----------+----------+----------+--------+--------+ Distal                        40                                   +-------------+-------+----------+----------+----------+--------+--------+ RT CIA Prox                   109       triphasic                  +-------------+-------+----------+----------+----------+--------+--------+ RT CIA Mid                    106       triphasic                   +-------------+-------+----------+----------+----------+--------+--------+ RT CIA Distal                 114       triphasic                  +-------------+-------+----------+----------+----------+--------+--------+ RT EIA Prox                   119       triphasic                  +-------------+-------+----------+----------+----------+--------+--------+ RT EIA Mid                    84        triphasic                  +-------------+-------+----------+----------+----------+--------+--------+ RT EIA Distal                 123       triphasic                  +-------------+-------+----------+----------+----------+--------+--------+ LT CIA Prox                   36        monophasic                 +-------------+-------+----------+----------+----------+--------+--------+ LT CIA Mid                    29        monophasic                 +-------------+-------+----------+----------+----------+--------+--------+ LT EIA Prox  54        monophasic                 +-------------+-------+----------+----------+----------+--------+--------+ LT EIA Mid                    48        monophasic                 +-------------+-------+----------+----------+----------+--------+--------+ LT EIA Distal                 29        monophasic                 +-------------+-------+----------+----------+----------+--------+--------+ Visualization of the Left CIA Proximal artery, Left CIA Mid artery, Left CIA Distal artery, Proximal Abdominal Aorta, Mid Abdominal Aorta and Distal Abdominal Aorta was limited. Incidental finding heavy calcification noted in proximal left CFA. Left Stent(s): +--------------+--++----------++ Prox to Stent +--------------+--++----------++ Proximal Stent61monophasic +--------------+--++----------++ Distal Stent  +--------------+--++----------++ Stent not well visualized  possible occlusion in sections of stent.   Summary: Stenosis: +-----------------+------------------------------------------------------------+ Location         Comments                                                     +-----------------+------------------------------------------------------------+ Left Common IliacStent not well visualized possible occlusion in sections of                   stent.                                                       +-----------------+------------------------------------------------------------+ Incidental finding heavy calcification noted in proximal left CFA.  *See table(s) above for measurements and observations. Suggest follow up study in Follow up with Dr.Berry.  Electronically signed by Lorine Bears MD on 07/04/2023 at 4:48:59 PM.    Final    VAS [place] ABI WITH/WO TBI  Result Date: 07/04/2023  LOWER EXTREMITY DOPPLER STUDY Patient Name:  Ian [name]  Date of Exam:   07/04/2023 Medical Rec #: [id number]       Accession #:    [id number] Date of Birth: 1970/09/08       Patient Gender: M Patient Age:   29 years Exam Location:  [facility] Procedure:      VAS [place] ABI WITH/WO TBI Referring Phys: Robet Leu --------------------------------------------------------------------------------  Indications: Claudication. patient reports for the past six months pain in his              left hip that radiates down left leg. Patient sometimes stops              walking to alleviate pain but tries to walk through it. High Risk Factors: Hypertension, hyperlipidemia, Diabetes, current smoker, prior                    MI, coronary artery disease.  Vascular Interventions: Left common iliac artery PTA and stenting of an                         occlusion by Dr.Berry on 03/27/2020. Comparison Study: Prior 10/24/2020, right 1.18 left  1.06. Performing Technologist: Jeryl Columbia Rdcs, rvt  Examination Guidelines: A complete evaluation includes at minimum, Doppler waveform signals  and systolic blood pressure reading at the level of bilateral brachial, anterior tibial, and posterior tibial arteries, when vessel segments are accessible. Bilateral testing is considered an integral part of a complete examination. Photoelectric Plethysmograph (PPG) waveforms and toe systolic pressure readings are included as required and additional duplex testing as needed. Limited examinations for reoccurring indications may be performed as noted.  ABI Findings: +---------+------------------+-----+-----------+--------+ Right    Rt Pressure (mmHg)IndexWaveform   Comment  +---------+------------------+-----+-----------+--------+ Brachial 160                                        +---------+------------------+-----+-----------+--------+ PTA      166               1.04 biphasic            +---------+------------------+-----+-----------+--------+ PERO     156               0.98 biphasic            +---------+------------------+-----+-----------+--------+ DP       165               1.03 multiphasic         +---------+------------------+-----+-----------+--------+ Great [id number]               0.85 Normal              +---------+------------------+-----+-----------+--------+ +---------+------------------+-----+----------+----------------+ Left     Lt Pressure (mmHg)IndexWaveform  Comment          +---------+------------------+-----+----------+----------------+ Brachial 159                                               +---------+------------------+-----+----------+----------------+ PTA      102               0.64 monophasic                 +---------+------------------+-----+----------+----------------+ PERO                                      Unable to obtain +---------+------------------+-----+----------+----------------+ DP       88                0.55 monophasic                 +---------+------------------+-----+----------+----------------+ Great  Toe108               0.68 Abnormal                   +---------+------------------+-----+----------+----------------+ +-------+-----------+-----------+------------+------------+ ABI/TBIToday's ABIToday's TBIPrevious ABIPrevious TBI +-------+-----------+-----------+------------+------------+ Right  1.04       0.85       1.18        1.06         +-------+-----------+-----------+------------+------------+ Left   0.64       0.68       1.06        0.77         +-------+-----------+-----------+------------+------------+  Right ABIs and TBIs appear essentially unchanged compared to prior study on 10/24/2020. Left ABIs and TBIs  appear decreased compared to prior study on 10/24/2020.  Summary: Right: Resting right ankle-brachial index is within normal range. The right toe-brachial index is normal. Left: Resting left ankle-brachial index indicates moderate left lower extremity arterial disease. The left toe-brachial index is abnormal. *See table(s) above for measurements and observations.  Suggest follow up study in Follow up with Dr. Allyson Sabal. Electronically signed by Lorine Bears MD on 07/04/2023 at 4:47:09 PM.    Final     ASSESSMENT & PLAN:  52 y.o. male with:  Polycythemia  ?secondary to smoking/COPD/untreated sleep apnea etc vs r/o Polycythemia vera  PLAN:  -CBC from 07/18/2023 showed WBC of 8.1K, hemoglobin of 18.8, and platelets of 153K. -hgb was 19.1 on 06/09/2023 -his RBCs have been rather high over the last several months with hematocrit 55-58% -there is no leukocytosis and other blood cells are normal, which is reassuring for making PCV less likely -discussed that the risks associated with elevated RBCs would depend on the cause. Discussed goal to determine whether patient has a primary bone marrow problem or if his bone marrow is responding to an outside condition  -his erythropoetin levels are normal, patient is unlikely to have a primary bone marrow disorder -did not feel  enlarged spleen during physical examination -unlikely that patient has polycythemia vera due to several secondary factors, including COPD, heart issues, smoking, sleep apnea, not using CPAP for some time, and higher hgb concentration from farxiga/diuretics -will order blood testing including to determine if patient has genetic mutation findings to suggest a bone marrow disorder.  -If there are no findings of a primary bone marrow disorder, I would recommend patient to follow with his PCP to address secondary factors -Recommend patient to follow with his PCP for considerations of sleep study and evaluation of sleep apnea. There would also be a role to monitor COPD and medication management.  -Given his Peripheral artery disease and coronary artery disease, discussed option for patient to donate blood to keep his hematocrit under 52% after his secondary factors are addressed. Patient is on blood thinners as well.  -counseled patient on smoking cessation  -recommend patient to stay well hydrated -recommend patient to wear compression socks when driving long distances. Discussed that if the ankle brachial index is usually very low, higher pressure compression socks sometimes cause skin breakdown. However, low pressure compression socks would be okay.  -answered all of patient's questions in detail  FOLLOW-UP: Labs on 07/25/2023 Phone visit with Dr Candise Che 2 weeks after labs  The total time spent in the appointment was 60 minutes* .  All of the patient's questions were answered with apparent satisfaction. The patient knows to call the clinic with any problems, questions or concerns.   Wyvonnia Lora MD MS AAHIVMS [facility] Hematology/Oncology Physician [facility]  .*Total Encounter Time as defined by the Centers for Medicare and Medicaid Services includes, in addition to the face-to-face time of a patient visit (documented in the note above) non-face-to-face time: obtaining and reviewing  outside history, ordering and reviewing medications, tests or procedures, care coordination (communications with other health care professionals or caregivers) and documentation in the medical record.    I,Mitra Faeizi,acting as a Neurosurgeon for Wyvonnia Lora, MD.,have documented all relevant documentation on the behalf of Wyvonnia Lora, MD,as directed by  Wyvonnia Lora, MD while in the presence of Wyvonnia Lora, MD.  .I have reviewed the above documentation for accuracy and completeness, and I agree with the above. Johney Maine MD

## 2023-07-21 ENCOUNTER — Telehealth: Payer: Self-pay

## 2023-07-21 NOTE — Telephone Encounter (Signed)
Left message to call back  

## 2023-07-21 NOTE — Telephone Encounter (Signed)
-----   Message from Jonita Albee sent at 07/21/2023  2:18 PM EST ----- Please tell patient that his echocardiogram showed EF (pumping function of the heart) is down to 35-40% (was previously 50-55% in 2021). Echo also showed mild thickening of the heart muscle. RV functioning normally. No significant valvular abnormalities.   His stress test showed no signs of ischemia (blockages in the blood vessels in the heart). Heart muscle is getting normal blood flow. Study did show reduced EF as well (weak pumping function of the heart).   Unfortunately, to be cleared by the DOT, his EF has to be above 40%. There are some medications that we can start to try to get his EF back up, but right now he should not be driving. Discussed with Dr. Allyson Sabal who agreed that he cannot drive with EF below 84%.   He has an appointment with Bernadene Person NP on Friday- at that appointment, they can make some medication adjustments to help support his heart.   Thanks,  [employer]

## 2023-07-22 NOTE — Telephone Encounter (Signed)
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 How long would patient need to wait for wait get back to work. Patient does not have insurance currently and had to cancel appointment for Friday due to this but is going to keep appointment with Dr. Allyson Sabal.

## 2023-07-22 NOTE — Telephone Encounter (Signed)
-----   Message from Jonita Albee sent at 07/21/2023  2:18 PM EST ----- Please tell patient that his echocardiogram showed EF (pumping function of the heart) is down to 35-40% (was previously 50-55% in 2021). Echo also showed mild thickening of the heart muscle. RV functioning normally. No significant valvular abnormalities.   His stress test showed no signs of ischemia (blockages in the blood vessels in the heart). Heart muscle is getting normal blood flow. Study did show reduced EF as well (weak pumping function of the heart).   Unfortunately, to be cleared by the DOT, his EF has to be above 40%. There are some medications that we can start to try to get his EF back up, but right now he should not be driving. Discussed with Dr. Allyson Sabal who agreed that he cannot drive with EF below 84%.   He has an appointment with Bernadene Person NP on Friday- at that appointment, they can make some medication adjustments to help support his heart.   Thanks,  [employer]

## 2023-07-23 ENCOUNTER — Other Ambulatory Visit: Payer: Self-pay | Admitting: Pharmacist

## 2023-07-23 DIAGNOSIS — E118 Type 2 diabetes mellitus with unspecified complications: Secondary | ICD-10-CM

## 2023-07-23 DIAGNOSIS — E782 Mixed hyperlipidemia: Secondary | ICD-10-CM

## 2023-07-23 NOTE — Progress Notes (Signed)
07/23/2023 Name: Ian [name] MRN: [id number] DOB: 04/30/1971  Chief Complaint  Patient presents with   Medication Management    Ian [name] is a 52 y.o. year old male who presented for a telephone visit.   They were referred to the pharmacist by their PCP for assistance in managing diabetes, medication access, and complex medication management.    Subjective:  Care Team: Primary Care Provider: Park Meo, FNP   Medication Access/Adherence  Current Pharmacy:  [facility] [address] [place] - [address] [place] [address] Phone: [phone] Fax: [phone]   Patient reports affordability concerns with their medications: Yes  Patient reports access/transportation concerns to their pharmacy: Yes  Patient reports adherence concerns with their medications:  No  due to cost/no insurance   Medication Management:  Current adherence strategy: none; uses pill bottles from [facility]  Patient reports Fair adherence to medications  Patient reports the following barriers to adherence: lack of insurance  Recent fill dates:  not available due to cash pay Reports he is currently taking: Eliquis  Albuterol Vitamin C 1000mg  Cinnamon Plavix Furosemide PRN Glipizide 10mg  bid  Lisinopril  Methocarbamol Metformin Metoprolol Nitrates Olopatatdine eye drops as needed Rosuvastatin   Objective:  Lab Results  Component Value Date   HGBA1C 8.9 (H) 06/09/2023   Lab Results  Component Value Date   CREATININE 0.91 07/18/2023   BUN 17 07/18/2023   NA 139 07/18/2023   K 4.2 07/18/2023   CL 101 07/18/2023   CO2 24 07/18/2023    Lab Results  Component Value Date   CHOL 126 06/09/2023   HDL 34 (L) 06/09/2023   LDLCALC 68 06/09/2023   TRIG 164 (H) 06/09/2023   CHOLHDL 3.7 06/09/2023    Medications Reviewed Today     Reviewed by Danella Maiers, [facility] (Pharmacist) on 08/29/23 at [phone]  Med List Status: <None>   Medication  Order Taking? Sig Documenting Provider Last Dose Status Informant  albuterol (VENTOLIN HFA) 108 (90 Base) MCG/ACT inhaler [id number] Yes Inhale 1-2 puffs into the lungs every 6 (six) hours as needed for wheezing or shortness of breath.  [provider] Taking Active Spouse/Significant Other  apixaban (ELIQUIS) 5 MG TABS tablet [id number]  Take 1 tablet (5 mg total) by mouth 2 (two) times daily. Runell Gess, MD  Active   apixaban (ELIQUIS) 5 MG TABS tablet [id number]  Take 1 tablet (5 mg total) by mouth 2 (two) times daily. Runell Gess, MD  Active   Ascorbic Acid (VITAMIN C) 1000 MG tablet [id number] Yes Take 1,000 mg by mouth daily. [provider] Taking Active   Blood Glucose Monitoring Suppl DEVI [id number]  1 each by Does not apply route in the morning, at noon, and at bedtime. May substitute to any manufacturer covered by patient's insurance. Donita Brooks, MD  Active   Cinnamon 500 MG capsule [id number] Yes Take 500 mg by mouth 2 (two) times daily.  [provider] Taking Active Spouse/Significant Other  clopidogrel (PLAVIX) 75 MG tablet [id number] Yes Take 1 tablet (75 mg total) by mouth daily. Jonita Albee, [name] Taking Active   dapagliflozin propanediol (FARXIGA) 5 MG TABS tablet [id number] No Take 2 tablets (10 mg total) by mouth daily. Take 10 mg by mouth daily.  Patient not taking: Reported on 08/29/2023   Donita Brooks, MD Not Taking Active   furosemide (LASIX) 40 MG tablet [id number] Yes Only as needed for weight gain of  3 lbs over night and/or 5 lbs over the week. Jonita Albee, [name] Taking Active   glipiZIDE (GLUCOTROL) 10 MG tablet [id number] Yes Take 1 tablet (10 mg total) by mouth 2 (two) times daily before a meal. Take 10 mg by mouth 2 (two) times daily before a meal. Donita Brooks, MD Taking Active   Glucose Blood (BLOOD GLUCOSE TEST STRIPS) STRP [id number]  1 each by In Vitro route in the morning, at noon, and at bedtime. May  substitute to any manufacturer covered by patient's insurance. Donita Brooks, MD  Active   Lancet Device MISC [id number]  1 each by Does not apply route in the morning, at noon, and at bedtime. May substitute to any manufacturer covered by patient's insurance. Donita Brooks, MD  Active   Lancets Misc. MISC [id number]  1 each by Does not apply route in the morning, at noon, and at bedtime. May substitute to any manufacturer covered by patient's insurance. Donita Brooks, MD  Active   lisinopril (ZESTRIL) 2.5 MG tablet [id number] Yes Take 1 tablet (2.5 mg total) by mouth daily. Park Meo, FNP Taking Active   metFORMIN (GLUCOPHAGE) 1000 MG tablet [id number]  Take 1 tablet (1,000 mg total) by mouth 2 (two) times daily with a meal. Donita Brooks, MD  Active   methocarbamol (ROBAXIN) 500 MG tablet [id number] Yes Take 1 tablet (500 mg total) by mouth 2 (two) times daily. [place], [place] N, FNP Taking Active   metoprolol tartrate (LOPRESSOR) 100 MG tablet [id number] Yes Take 1 tablet (100 mg total) by mouth 2 (two) times daily. [name], MD Taking Active Spouse/Significant Other  nitroGLYCERIN (NITROSTAT) 0.4 MG SL tablet [id number] Yes Place 1 tablet under the tongue every 5 (five) minutes as needed. [provider] Taking Active   olopatadine (PATANOL) 0.1 % ophthalmic solution [id number] Yes Place 1 drop into both eyes 2 (two) times daily. Park Meo, FNP Taking Active   Omega-3 Fatty Acids (FISH OIL) 1200 MG CAPS [id number] Yes Take 2,000 mg by mouth daily. Pt takes 2 tabs daily [provider] Taking Active Spouse/Significant Other  OVER THE COUNTER MEDICATION [id number] Yes Beets supplement [provider] Taking Active   predniSONE (DELTASONE) 20 MG tablet [id number]  3 tabs poqday 1-2, 2 tabs poqday 3-4, 1 tab poqday 5-6 Donita Brooks, MD  Active   rosuvastatin (CRESTOR) 20 MG tablet [id number]  Take 1 tablet (20 mg total) by mouth daily. Park Meo, FNP  Active   sildenafil (VIAGRA) 50 MG tablet [id number]  Take 1 tablet (50 mg total) by mouth daily as needed for erectile dysfunction. [name], MD  Active Spouse/Significant Other  Med List Note Ian [name], [place] 05/[birth year]15 1017): No pharmacy preferred             Assessment/Plan:   Medication Management: - Currently strategy and no insurance makes it challenging to adhere to prescribed medication regimen - Suggested use of weekly pill box to organize medications - Patient can utilize mychart to create a list of medication, indication, and administration time.  - [facility] -instructed patient to reach out to cardiology if unable to obtain eliquis due to cost -patient reports he will likely regain insurance in the coming year    Follow Up Plan: 2 months; PharmD 09/03/23, PCP 09/08/23  Kieth Brightly, PharmD, BCACP, CPP Clinical Pharmacist, [facility]

## 2023-07-24 NOTE — Progress Notes (Incomplete)
HEMATOLOGY/ONCOLOGY CONSULTATION NOTE  Date of Service: 07/19/2023  Patient Care Team: Park Meo, FNP as PCP - General (Family Medicine) Runell Gess, MD as PCP - Cardiology (Cardiology) Croitoru, Rachelle Hora, MD as Consulting Physician (Cardiology)  CHIEF COMPLAINTS/PURPOSE OF CONSULTATION:  Evaluation and management of elevated hemoglobin  HISTORY OF PRESENTING ILLNESS:   Ian [name] is a wonderful 52 y.o. male who has been referred to [place] by Park Meo, FNP for evaluation and management of elevated hemoglobin levels.   He reports a hx of NSTEMI in 2016. Patient also reports history of atrial fibrillation and notes that he has two stents placed in his heart and one in his right artery. He reports that his artery was previously 100% blocked and is currently partially blocked.   Patient received a nuclear stress test yesterday. [place] from two weeks ago showed that his stent in left common iliac was not welll visualized possible occlusion in sections of stent. Patient reports endorsing claudication on exertion. Patient reports leg swelling sometimes when he pushes his socks down.   In regards to his sleep apnea he reports that he generally uses a CPAP machine, but has not for the past month due to right upper extremity pain. He reports that the pain began in his shoulder, then forearm, then hand. He has received a steroid shot yesterday and was prescribed muscle relaxants. Stress test showed normal findings. He notes that he did not have any pain in his arm during his previous heart attack. Patient reports a history of lifting heavy objects in the past.   His sleep apnea has been managed by his pulmonologist in [place], [place]. He did have one sleep study previously, more than decade ago.   Patient reports that he continues to smoke 2 packs a day. He reports a hx of COPD, which is smoking related. Patient does use an inhaler occasionally as needed. He reports that he has previously  tried nicotine patches. Patient reports that he is unable to take Chantix. He reports that he has tried vaping for some time and felt like he was drowning.  Patient reports that he did previously quit smoking for some time. He notes that he gained some weight afterwards, from 250 pounds to 386 pounds. Patient reports that his weight at the time of his first sleep study was 275 pounds. His current weight is 253 pounds.   Patient reports a history of DM and is on medications for management including farxiga. He is on lasix as well. Patient reports that he is on a blood thinners at this time.   Patient was previously seen by a pulmonologist and was found to have lung nodules. He received a biopsy 1-2 years ago and results were benign.  Patient denies any abdominal pain. He denies any hx of blood clots in the past.   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   MEDICAL HISTORY:  Past Medical History:  Diagnosis Date  . Arthritis    "left ankle" (09/04/2016)  . Cardiomyopathy (HCC)    a. EF 40-45% by prior echo. .b. 50-55% in 02/2020.  Marland Kitchen Complication of anesthesia    "hard to get me out of it sometimes; usually takes me longer to get under"  . Coronary artery disease    a. multiple PCIs to RCA.  . Essential hypertension 12/19/2015  . Hyperlipidemia 05/21/2015  . Hypertension   . NSTEMI (non-ST elevated myocardial infarction) (HCC) 05/2015   /  notes 05/23/2015 "minor one"  . PAD (peripheral artery disease) (HCC)   . PAF (paroxysmal atrial fibrillation) (HCC)   . Tobacco abuse 05/21/2015  . Type II diabetes mellitus (HCC)     SURGICAL HISTORY: Past Surgical History:  Procedure Laterality Date  . ABDOMINAL AORTOGRAM W/LOWER EXTREMITY Bilateral 03/27/2020   Procedure: ABDOMINAL AORTOGRAM W/LOWER EXTREMITY;  Surgeon: Runell Gess, MD;  Location: [facility] INVASIVE CV LAB;  Service: Cardiovascular;  Laterality: Bilateral;  . CARDIAC CATHETERIZATION  2000,  2005   "less than 20% blockage"  . CARDIAC CATHETERIZATION N/A 05/22/2015   Procedure: Left Heart Cath and Coronary Angiography;  Surgeon: [name], MD;  Location: [facility] INVASIVE CV LAB;  Service: Cardiovascular;  Laterality: N/A;  . CARDIAC CATHETERIZATION N/A 05/22/2015   Procedure: Coronary Stent Intervention;  Surgeon: [name], MD;  Location: [facility] INVASIVE CV LAB;  Service: Cardiovascular;  Laterality: N/A;  . CARDIAC CATHETERIZATION N/A 09/04/2016   Procedure: Left Heart Cath and Coronary Angiography;  Surgeon: [name], MD;  Location: [facility] INVASIVE CV LAB;  Service: Cardiovascular;  Laterality: N/A;  . CARDIAC CATHETERIZATION N/A 09/04/2016   Procedure: Coronary Balloon Angioplasty;  Surgeon: [name], MD;  Location: [facility] INVASIVE CV LAB;  Service: Cardiovascular;  Laterality: N/A;  . CARDIAC CATHETERIZATION N/A 09/05/2016   Procedure: Coronary Atherectomy;  Surgeon: Marykay Lex, MD;  Location: [facility] INVASIVE CV LAB;  Service: Cardiovascular;  Laterality: N/A;  . CARDIAC CATHETERIZATION N/A 09/05/2016   Procedure: Coronary Stent Intervention;  Surgeon: Marykay Lex, MD;  Location: [facility] INVASIVE CV LAB;  Service: Cardiovascular;  Laterality: N/A;  . CARDIAC CATHETERIZATION N/A 09/05/2016   Procedure: Temporary Pacemaker;  Surgeon: Marykay Lex, MD;  Location: [facility] INVASIVE CV LAB;  Service: Cardiovascular;  Laterality: N/A;  . CORONARY ANGIOPLASTY    . CORONARY PRESSURE/FFR STUDY N/A 10/16/2018   Procedure: INTRAVASCULAR PRESSURE WIRE/FFR STUDY;  Surgeon: [name], MD;  Location: [facility] INVASIVE CV LAB;  Service: Cardiovascular;  Laterality: N/A;  . LEFT HEART CATH AND CORONARY ANGIOGRAPHY N/A 10/16/2018   Procedure: LEFT HEART CATH AND CORONARY ANGIOGRAPHY;  Surgeon: [name], MD;  Location: [facility] INVASIVE CV LAB;  Service: Cardiovascular;  Laterality: N/A;  . MULTIPLE TOOTH EXTRACTIONS  2013   "all of them"  . PERIPHERAL VASCULAR  INTERVENTION  03/27/2020   Procedure: PERIPHERAL VASCULAR INTERVENTION;  Surgeon: Runell Gess, MD;  Location: [facility] INVASIVE CV LAB;  Service: Cardiovascular;;  Left iliac artery   . TYMPANOSTOMY TUBE PLACEMENT Bilateral 1970s    SOCIAL HISTORY: Social History   Socioeconomic History  . Marital status: Divorced    Spouse name: Not on file  . Number of children: Not on file  . Years of education: Not on file  . Highest education level: Not on file  Occupational History  . Not on file  Tobacco Use  . Smoking status: Every Day    Current packs/day: 2.00    Average packs/day: 2.0 packs/day for 35.0 years (70.0 ttl pk-yrs)    Types: Cigarettes  . Smokeless tobacco: Never  . Tobacco comments:    Pt stated that he smokes  Vaping Use  . Vaping status: Never Used  Substance and Sexual Activity  . Alcohol use: Yes    Alcohol/week: 0.0 standard drinks of alcohol    Comment: 09/04/2016  "might have a few drinks/year; nothing since 05/2015"  . Drug use: Yes    Types: Cocaine, Marijuana    Comment: 09/04/2016 "haven't touched  drugs since 1988"  . Sexual activity: Yes  Other Topics Concern  . 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   Social Determinants of Health   Financial Resource Strain: Not on file  Food Insecurity: Not on file  Transportation Needs: Not on file  Physical Activity: Not on file  Stress: Not on file  Social Connections: Not on file  Intimate Partner Violence: Not on file    FAMILY HISTORY: Family History  Problem Relation Age of Onset  . Diabetes Mother   . Heart attack Father   . Diabetes Sister   . Diabetes Maternal Grandmother     ALLERGIES:  is allergic to atorvastatin.  MEDICATIONS:  Current Outpatient Medications  Medication Sig Dispense Refill  . albuterol (VENTOLIN HFA) 108 (90 Base) MCG/ACT inhaler Inhale 1-2 puffs into the lungs every 6 (six) hours as needed for wheezing or  shortness of breath.     Marland Kitchen apixaban (ELIQUIS) 5 MG TABS tablet Take 1 tablet by mouth twice daily 180 tablet 1  . Ascorbic Acid (VITAMIN C) 1000 MG tablet Take 1,000 mg by mouth daily.    . Cinnamon 500 MG capsule Take 500 mg by mouth 2 (two) times daily.     . clopidogrel (PLAVIX) 75 MG tablet Take 1 tablet (75 mg total) by mouth daily. 90 tablet 3  . dapagliflozin propanediol (FARXIGA) 5 MG TABS tablet Take 2 tablets (10 mg total) by mouth daily. Take 10 mg by mouth daily. 30 tablet 90  . furosemide (LASIX) 40 MG tablet Only as needed for weight gain of 3 lbs over night and/or 5 lbs over the week. 90 tablet 3  . glipiZIDE (GLUCOTROL) 10 MG tablet Take 1 tablet (10 mg total) by mouth 2 (two) times daily before a meal. Take 10 mg by mouth 2 (two) times daily before a meal. 90 tablet 0  . lisinopril (ZESTRIL) 2.5 MG tablet Take 1 tablet (2.5 mg total) by mouth daily. 90 tablet 0  . metFORMIN (GLUCOPHAGE) 500 MG tablet Take 2 tablets (1,000 mg total) by mouth 2 (two) times daily with a meal. 90 tablet 0  . methocarbamol (ROBAXIN) 500 MG tablet Take 1 tablet (500 mg total) by mouth 2 (two) times daily. 20 tablet 0  . metoprolol tartrate (LOPRESSOR) 100 MG tablet Take 1 tablet (100 mg total) by mouth 2 (two) times daily. 180 tablet 3  . nitroGLYCERIN (NITROSTAT) 0.4 MG SL tablet Place 1 tablet under the tongue every 5 (five) minutes as needed.    Marland Kitchen olopatadine (PATANOL) 0.1 % ophthalmic solution Place 1 drop into both eyes 2 (two) times daily. 5 mL 12  . Omega-3 Fatty Acids (FISH OIL) 1200 MG CAPS Take 2,000 mg by mouth daily. Pt takes 2 tabs daily    . OVER THE COUNTER MEDICATION Beets supplement    . rosuvastatin (CRESTOR) 20 MG tablet Take 1 tablet (20 mg total) by mouth daily. 90 tablet 0  . sildenafil (VIAGRA) 50 MG tablet Take 1 tablet (50 mg total) by mouth daily as needed for erectile dysfunction. 10 tablet 6   No current facility-administered medications for this visit.    REVIEW OF  SYSTEMS:    10 Point review of Systems was done is negative except as noted above.  PHYSICAL EXAMINATION: ECOG PERFORMANCE STATUS: {CHL ONC ECOG ZO:[id number]}  .There were no vitals filed for this visit. There were no vitals filed for this visit. Marland Kitchen  There is no height or weight on file to calculate BMI.  GENERAL:alert, in no acute distress and comfortable SKIN: no acute rashes, no significant lesions EYES: conjunctiva are pink and non-injected, sclera anicteric OROPHARYNX: MMM, no exudates, no oropharyngeal erythema or ulceration NECK: supple, no JVD LYMPH:  no palpable lymphadenopathy in the cervical, axillary or inguinal regions LUNGS: clear to auscultation b/l with normal respiratory effort HEART: regular rate & rhythm ABDOMEN:  normoactive bowel sounds , non tender, not distended. Extremity: no pedal edema PSYCH: alert & oriented x 3 with fluent speech NEURO: no focal motor/sensory deficits  LABORATORY DATA:  I have reviewed the data as listed  .    Latest Ref Rng & Units 07/18/2023    2:54 PM 06/09/2023    9:32 AM 03/28/2020    2:47 AM  CBC  WBC 4.0 - 10.5 K/uL 8.1  7.4  7.7   Hemoglobin 13.0 - 17.0 g/dL 46.9  62.9  52.8   Hematocrit 39.0 - 52.0 % 55.0  56.4  51.5   Platelets 150 - 400 K/uL 153  158  128     .    Latest Ref Rng & Units 07/18/2023    2:54 PM 06/09/2023    9:32 AM 03/28/2020    2:47 AM  CMP  Glucose 70 - 99 mg/dL 413  244  010   BUN 6 - 20 mg/dL 17  16  12    Creatinine 0.61 - 1.24 mg/dL 2.72  5.36  6.44   Sodium 135 - 145 mmol/L 139  141  139   Potassium 3.5 - 5.1 mmol/L 4.2  4.3  4.3   Chloride 98 - 111 mmol/L 101  104  102   CO2 22 - 32 mmol/L 24  27  27    Calcium 8.9 - 10.3 mg/dL 9.7  9.6  9.2   Total Protein 6.5 - 8.1 g/dL 6.8  7.2    Total Bilirubin <1.2 mg/dL 1.2  0.8    Alkaline Phos 38 - 126 U/L 57     AST 15 - 41 U/L 24  18    ALT 0 - 44 U/L 20  17       RADIOGRAPHIC STUDIES: I have personally reviewed the radiological images as  listed and agreed with the findings in the report. MYOCARDIAL PERFUSION IMAGING  Result Date: 07/18/2023 .  Findings are consistent with no ischemia. The study is intermediate risk due to depressed systolic function. .  No ST deviation was noted. .  LV perfusion is normal. .  Left ventricular function is abnormal. Global function is moderately reduced. Nuclear stress EF: 32%. The left ventricular ejection fraction is moderately decreased (30-44%). End diastolic cavity size is mildly enlarged. End systolic cavity size is mildly enlarged. .  Prior study available for comparison from 03/06/2020. There are changes compared to prior study. The left ventricular ejection fraction has decreased. .  Electronically signed by Thurmon Fair, MD Intermediate risk stress nuclear study with normal perfusion and moderately reduced left ventricular global systolic function. Left ventricular function has decreased from 43% to 32% compared to 2021. In the setting of atrial fibrillation, calculation of left ventricular ejection fraction can be erroneous. Correlate with echocardiography.  ECHOCARDIOGRAM COMPLETE  Result Date: 07/18/2023    ECHOCARDIOGRAM REPORT   Patient Name:   ERIOLUWA BEDELL Date of Exam: 07/18/2023 Medical Rec #:  [id number]      Height:       71.0 in Accession #:    [id number]  Weight:       249.0 lb Date of Birth:  1970-08-17      BSA:          2.314 m Patient Age:    52 years       BP:           123/82 mmHg Patient Gender: M              HR:           93 bpm. Exam Location:  [address] Procedure: 2D Echo, Cardiac Doppler and Color Doppler Indications:    R06.02 Shortness of breath  History:        Patient has prior history of Echocardiogram examinations, most                 recent 02/15/2020. Cardiomyopathy, Arrythmias:Atrial Fibrillation;                 Risk Factors:Hypertension, Diabetes, Dyslipidemia and Current                 Smoker. NSTEMI.  Sonographer:    Daphine Deutscher RDCS Referring  Phys: [id number] Jonita Albee IMPRESSIONS  1. Left ventricular ejection fraction, by estimation, is 35 to 40%. The left ventricle has moderately decreased function. The left ventricle demonstrates global hypokinesis. There is mild left ventricular hypertrophy. Left ventricular diastolic parameters are indeterminate.  2. Right ventricular systolic function is normal. The right ventricular size is mildly enlarged. Tricuspid regurgitation signal is inadequate for assessing PA pressure.  3. The mitral valve is normal in structure. Trivial mitral valve regurgitation. No evidence of mitral stenosis.  4. The aortic valve was not well visualized. Aortic valve regurgitation is not visualized. No aortic stenosis is present.  5. The inferior vena cava is normal in size with greater than 50% respiratory variability, suggesting right atrial pressure of 3 mmHg. FINDINGS  Left Ventricle: Left ventricular ejection fraction, by estimation, is 35 to 40%. The left ventricle has moderately decreased function. The left ventricle demonstrates global hypokinesis. The left ventricular internal cavity size was normal in size. There is mild left ventricular hypertrophy. Left ventricular diastolic parameters are indeterminate. Right Ventricle: The right ventricular size is mildly enlarged. No increase in right ventricular wall thickness. Right ventricular systolic function is normal. Tricuspid regurgitation signal is inadequate for assessing PA pressure. Left Atrium: Left atrial size was normal in size. Right Atrium: Right atrial size was normal in size. Pericardium: There is no evidence of pericardial effusion. Mitral Valve: The mitral valve is normal in structure. Trivial mitral valve regurgitation. No evidence of mitral valve stenosis. Tricuspid Valve: The tricuspid valve is normal in structure. Tricuspid valve regurgitation is trivial. Aortic Valve: The aortic valve was not well visualized. Aortic valve regurgitation is not visualized.  No aortic stenosis is present. Pulmonic Valve: The pulmonic valve was not well visualized. Pulmonic valve regurgitation is trivial. Aorta: The aortic root and ascending aorta are structurally normal, with no evidence of dilitation. Venous: The inferior vena cava is normal in size with greater than 50% respiratory variability, suggesting right atrial pressure of 3 mmHg. IAS/Shunts: The interatrial septum was not well visualized.  LEFT VENTRICLE PLAX 2D LVIDd:         4.60 cm LVIDs:         3.80 cm LV PW:         1.10 cm LV IVS:        1.10 cm LVOT diam:     2.30 cm  LV SV:         47 LV SV Index:   20 LVOT Area:     4.15 cm  RIGHT VENTRICLE             IVC RV Basal diam:  4.10 cm     IVC diam: 1.90 cm RV S prime:     11.63 cm/s TAPSE (M-mode): 1.7 cm LEFT ATRIUM             Index        RIGHT ATRIUM           Index LA diam:        5.30 cm 2.29 cm/m   RA Area:     18.30 cm LA Vol (A2C):   49.1 ml 21.22 ml/m  RA Volume:   54.90 ml  23.72 ml/m LA Vol (A4C):   59.2 ml 25.58 ml/m LA Biplane Vol: 55.8 ml 24.11 ml/m  AORTIC VALVE LVOT Vmax:   72.30 cm/s LVOT Vmean:  47.833 cm/s LVOT VTI:    0.113 m  AORTA Ao Root diam: 3.50 cm Ao Asc diam:  3.50 cm MV E velocity: 100.62 cm/s                             SHUNTS                             Systemic VTI:  0.11 m                             Systemic Diam: 2.30 cm Epifanio Lesches MD Electronically signed by Epifanio Lesches MD Signature Date/Time: 07/18/2023/12:05:01 PM    Final    VAS US AORTA/IVC/ILIACS  Result Date: 07/04/2023 ABDOMINAL AORTA STUDY Patient Name:  EYAL TANTILLO  Date of Exam:   07/04/2023 Medical Rec #: [id number]       Accession #:    [id number] Date of Birth: [birth year]       Patient Gender: M Patient Age:   25 years Exam Location:  [facility] Procedure:      VAS US AORTA/IVC/ILIACS Referring Phys: Robet Leu --------------------------------------------------------------------------------  Indications: Patient reports for the past six  months pain in his left hip that              radiates down left leg. Patient sometimes stops walking to              alleviate pain but tries to walk through it. Risk Factors: Hypertension, hyperlipidemia, Diabetes, current smoker, prior MI,               coronary artery disease. Vascular Interventions: Left common iliac artery PTA and stenting of an                         occlusion by Dr.Berry on 03/27/2020. Limitations: Air/bowel gas, obesity and patient discomfort.  Comparison Study: Prior aorta-iliac showed low end > 50% stenosis in left CIA                   proximal stent. Performing Technologist: Jeryl Columbia RDCS  Examination Guidelines: A complete evaluation includes B-mode imaging, spectral Doppler, color Doppler, and power Doppler as needed of all accessible portions of each vessel. Bilateral testing is considered an integral part of a complete examination. Limited examinations for reoccurring  indications may be performed as noted.  Abdominal Aorta Findings: +-------------+-------+----------+----------+----------+--------+--------+ Location     AP (cm)Trans (cm)PSV (cm/s)Waveform  ThrombusComments +-------------+-------+----------+----------+----------+--------+--------+ Proximal                      48                                   +-------------+-------+----------+----------+----------+--------+--------+ Mid                           42        biphasic                   +-------------+-------+----------+----------+----------+--------+--------+ Distal                        40                                   +-------------+-------+----------+----------+----------+--------+--------+ RT CIA Prox                   109       triphasic                  +-------------+-------+----------+----------+----------+--------+--------+ RT CIA Mid                    106       triphasic                   +-------------+-------+----------+----------+----------+--------+--------+ RT CIA Distal                 114       triphasic                  +-------------+-------+----------+----------+----------+--------+--------+ RT EIA Prox                   119       triphasic                  +-------------+-------+----------+----------+----------+--------+--------+ RT EIA Mid                    84        triphasic                  +-------------+-------+----------+----------+----------+--------+--------+ RT EIA Distal                 123       triphasic                  +-------------+-------+----------+----------+----------+--------+--------+ LT CIA Prox                   36        monophasic                 +-------------+-------+----------+----------+----------+--------+--------+ LT CIA Mid                    29        monophasic                 +-------------+-------+----------+----------+----------+--------+--------+ LT EIA Prox                   54  monophasic                 +-------------+-------+----------+----------+----------+--------+--------+ LT EIA Mid                    48        monophasic                 +-------------+-------+----------+----------+----------+--------+--------+ LT EIA Distal                 29        monophasic                 +-------------+-------+----------+----------+----------+--------+--------+ Visualization of the Left CIA Proximal artery, Left CIA Mid artery, Left CIA Distal artery, Proximal Abdominal Aorta, Mid Abdominal Aorta and Distal Abdominal Aorta was limited. Incidental finding heavy calcification noted in proximal left CFA. Left Stent(s): +--------------+--++----------++ Prox to Stent +--------------+--++----------++ Proximal Stent50monophasic +--------------+--++----------++ Distal Stent  +--------------+--++----------++ Stent not well visualized  possible occlusion in sections of stent.   Summary: Stenosis: +-----------------+------------------------------------------------------------+ Location         Comments                                                     +-----------------+------------------------------------------------------------+ Left Common IliacStent not well visualized possible occlusion in sections of                   stent.                                                       +-----------------+------------------------------------------------------------+ Incidental finding heavy calcification noted in proximal left CFA.  *See table(s) above for measurements and observations. Suggest follow up study in Follow up with Dr.Berry.  Electronically signed by Lorine Bears MD on 07/04/2023 at 4:48:59 PM.    Final    VAS [place] ABI WITH/WO TBI  Result Date: 07/04/2023  LOWER EXTREMITY DOPPLER STUDY Patient Name:  Rameek Grasser  Date of Exam:   07/04/2023 Medical Rec #: [id number]       Accession #:    [id number] Date of Birth: 1971/01/21       Patient Gender: M Patient Age:   43 years Exam Location:  [facility] Procedure:      VAS [place] ABI WITH/WO TBI Referring Phys: Robet Leu --------------------------------------------------------------------------------  Indications: Claudication. patient reports for the past six months pain in his              left hip that radiates down left leg. Patient sometimes stops              walking to alleviate pain but tries to walk through it. High Risk Factors: Hypertension, hyperlipidemia, Diabetes, current smoker, prior                    MI, coronary artery disease.  Vascular Interventions: Left common iliac artery PTA and stenting of an                         occlusion by Dr.Berry on 03/27/2020. Comparison Study: Prior 10/24/2020, right 1.18 left 1.06. Performing Technologist: Jeryl Columbia Rdcs, rvt  Examination Guidelines: A complete evaluation includes at minimum, Doppler waveform signals  and systolic blood pressure reading at the level of bilateral brachial, anterior tibial, and posterior tibial arteries, when vessel segments are accessible. Bilateral testing is considered an integral part of a complete examination. Photoelectric Plethysmograph (PPG) waveforms and toe systolic pressure readings are included as required and additional duplex testing as needed. Limited examinations for reoccurring indications may be performed as noted.  ABI Findings: +---------+------------------+-----+-----------+--------+ Right    Rt Pressure (mmHg)IndexWaveform   Comment  +---------+------------------+-----+-----------+--------+ Brachial 160                                        +---------+------------------+-----+-----------+--------+ PTA      166               1.04 biphasic            +---------+------------------+-----+-----------+--------+ PERO     156               0.98 biphasic            +---------+------------------+-----+-----------+--------+ DP       165               1.03 multiphasic         +---------+------------------+-----+-----------+--------+ Great [id number]               0.85 Normal              +---------+------------------+-----+-----------+--------+ +---------+------------------+-----+----------+----------------+ Left     Lt Pressure (mmHg)IndexWaveform  Comment          +---------+------------------+-----+----------+----------------+ Brachial 159                                               +---------+------------------+-----+----------+----------------+ PTA      102               0.64 monophasic                 +---------+------------------+-----+----------+----------------+ PERO                                      Unable to obtain +---------+------------------+-----+----------+----------------+ DP       88                0.55 monophasic                 +---------+------------------+-----+----------+----------------+ Great  Toe108               0.68 Abnormal                   +---------+------------------+-----+----------+----------------+ +-------+-----------+-----------+------------+------------+ ABI/TBIToday's ABIToday's TBIPrevious ABIPrevious TBI +-------+-----------+-----------+------------+------------+ Right  1.04       0.85       1.18        1.06         +-------+-----------+-----------+------------+------------+ Left   0.64       0.68       1.06        0.77         +-------+-----------+-----------+------------+------------+  Right ABIs and TBIs appear essentially unchanged compared to prior study on 10/24/2020. Left ABIs and TBIs appear decreased compared to prior study on 10/24/2020.  Summary: Right: Resting right ankle-brachial index is within normal range. The right toe-brachial index is normal. Left: Resting left ankle-brachial index indicates moderate left lower extremity arterial disease. The left toe-brachial index is abnormal. *See table(s) above for measurements and observations.  Suggest follow up study in Follow up with Dr. Allyson Sabal. Electronically signed by Lorine Bears MD on 07/04/2023 at 4:47:09 PM.    Final     ASSESSMENT & PLAN:  52 y.o. male with:  Elevated RBCs  PLAN:  -CBC from 07/18/2023 showed WBC of 8.1K, hemoglobin of 18.8, and platelets of 153K. -hgb was 19.1 on 06/09/2023 -his RBCs have been rather high over the last several months with hematocrit 55-58% -there is no leukocytosis and other blood cells are normal, which is reassuring -discussed that the risks associated with elevated RBCs would depend on the cause. Discussed goal to determine whether patient has a primary bone marrow problem or if his bone marrow is responding to an outside condition  -his erythropoetin levels are normal, patient is unlikely to have a primary bone marrow disorder -did not feel enlarged spleen during physical examination -unlikely that patient has polycythemia vera due to several  secondary factors, including COPD, heart issues, smoking, sleep apnea, not using CPAP for some time, and higher hgb concentration from farxiga/diuretics -will order blood testing including to determine if patient has genetic mutation findings to suggest a bone marrow disorder.  -If there are no findings of a primary bone marrow disorder, I would recommend patient to follow with his PCP to address secondary factors -Recommend patient to follow with his PCP for considerations of sleep study and evaluation of sleep apnea. There would also be a role to monitor COPD and medication management.  -Given his Peripheral artery disease and coronary artery disease, discussed option for patient to donate blood to keep his hematocrit under 52% after his secondary factors are addressed. Patient is on blood thinners as well.  -counseled patient on smoking cessation  -recommend patient to stay well hydrated -recommend patient to wear compression socks when driving long distances. Discussed that if the ankle brachial index is usually very low, higher pressure compression socks sometimes cause skin breakdown. However, low pressure compression socks would be okay.  -answered all of patient's questions in detail  FOLLOW-UP: Labs on 07/25/2023 Phone visit with Dr Candise Che 2 weeks after labs  The total time spent in the appointment was *** minutes* .  All of the patient's questions were answered with apparent satisfaction. The patient knows to call the clinic with any problems, questions or concerns.   Wyvonnia Lora MD MS AAHIVMS [facility] Hematology/Oncology Physician [facility]  .*Total Encounter Time as defined by the Centers for Medicare and Medicaid Services includes, in addition to the face-to-face time of a patient visit (documented in the note above) non-face-to-face time: obtaining and reviewing outside history, ordering and reviewing medications, tests or procedures, care coordination  (communications with other health care professionals or caregivers) and documentation in the medical record.    I,Mitra Faeizi,acting as a Neurosurgeon for Wyvonnia Lora, MD.,have documented all relevant documentation on the behalf of Wyvonnia Lora, MD,as directed by  Wyvonnia Lora, MD while in the presence of Wyvonnia Lora, MD.  ***

## 2023-07-25 ENCOUNTER — Ambulatory Visit: Payer: Self-pay | Admitting: Nurse Practitioner

## 2023-07-25 ENCOUNTER — Other Ambulatory Visit: Payer: Self-pay

## 2023-07-25 NOTE — Telephone Encounter (Signed)
Ian Gess, Ian [name]  [name], CMA    I haven't seen him in over 3 years and I was his PV Ian [name] only, not his general cardiologist. Not sure whether I'm going to assume that role but in any event, I can't comment on his LV dysfunction until he's seen in the office and evaluated for this   Left message for pt to call back.

## 2023-07-28 ENCOUNTER — Ambulatory Visit: Payer: Self-pay | Attending: Cardiovascular Disease | Admitting: Cardiovascular Disease

## 2023-07-28 ENCOUNTER — Encounter: Payer: Self-pay | Admitting: Cardiovascular Disease

## 2023-07-28 VITALS — BP 116/72 | HR 107 | Ht 71.0 in | Wt 250.0 lb

## 2023-07-28 DIAGNOSIS — I2583 Coronary atherosclerosis due to lipid rich plaque: Secondary | ICD-10-CM

## 2023-07-28 DIAGNOSIS — I739 Peripheral vascular disease, unspecified: Secondary | ICD-10-CM

## 2023-07-28 DIAGNOSIS — Z720 Tobacco use: Secondary | ICD-10-CM

## 2023-07-28 DIAGNOSIS — I255 Ischemic cardiomyopathy: Secondary | ICD-10-CM

## 2023-07-28 DIAGNOSIS — I480 Paroxysmal atrial fibrillation: Secondary | ICD-10-CM

## 2023-07-28 DIAGNOSIS — E782 Mixed hyperlipidemia: Secondary | ICD-10-CM

## 2023-07-28 DIAGNOSIS — R079 Chest pain, unspecified: Secondary | ICD-10-CM

## 2023-07-28 DIAGNOSIS — I10 Essential (primary) hypertension: Secondary | ICD-10-CM

## 2023-07-28 DIAGNOSIS — I2510 Atherosclerotic heart disease of native coronary artery without angina pectoris: Secondary | ICD-10-CM

## 2023-07-28 NOTE — Progress Notes (Signed)
07/28/2023 [name]   04/20/1971  [id number]  Primary Physician Park Meo, FNP Primary Cardiologist: Runell Gess MD FACP, [place], [place], [place]  HPI:  Ian [name] is a 52 y.o.  moderately overweight married Caucasian male father of 3 children, grandfather of 3 grandchildren who I last saw in the office 04/24/2020.  He is fairly tearful today, because his wife [name] who is with him in the office is has left him. He was referred by Dr. [name], his cardiologist, for peripheral vascular evaluation because of lifestyle imaging claudication.  He works as a Agricultural consultant.  His risk factors include 60-pack-year tobacco abuse continue to smoke 1-1/2 packs a day recalcitrant to risk factor modification.  He has treated diabetes and hyperlipidemia.  He also has PAF on Eliquis.  His family history is remarkable for father who apparently had CAD.  He has had multiple coronary interventions dating back to 2016 when he had RCA stent.  He had orbital atherectomy his RCA restenting 09/05/2016.  He has had claudication for 4 years primarily on the left side with recent Doppler studies performed 03/06/2020 revealing a right ABI that was normal and a left of 0.53 with an occluded left common iliac artery.   I performed peripheral angiography on him 03/27/2020 revealing an occluded left common iliac artery which I was able to cross and stented with a VBX covered stent.  He was discharged home the following day.  His Dopplers performed 8/21 unfortunately, he does continue to smoke however.  Since I saw him 3 years ago he did move down to [facility] and is since returned.  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  He continues to smoke a pack and a half a day.  He denies chest pain.  He has had recurrent left hip pain reminiscent of his prior claudication with Dopplers that show decline in his ABI from 1.06-0.64.  In addition, 2D echo revealed a decline in his EF down to  35 to 40% without significant valvular abnormalities and a cardiac PET study that showed LV dysfunction without evidence of ischemia.   Current Meds  Medication Sig   albuterol (VENTOLIN HFA) 108 (90 Base) MCG/ACT inhaler Inhale 1-2 puffs into the lungs every 6 (six) hours as needed for wheezing or shortness of breath.    apixaban (ELIQUIS) 5 MG TABS tablet Take 1 tablet by mouth twice daily   Ascorbic Acid (VITAMIN C) 1000 MG tablet Take 1,000 mg by mouth daily.   Cinnamon 500 MG capsule Take 500 mg by mouth 2 (two) times daily.    clopidogrel (PLAVIX) 75 MG tablet Take 1 tablet (75 mg total) by mouth daily.   dapagliflozin propanediol (FARXIGA) 5 MG TABS tablet Take 2 tablets (10 mg total) by mouth daily. Take 10 mg by mouth daily.   furosemide (LASIX) 40 MG tablet Only as needed for weight gain of 3 lbs over night and/or 5 lbs over the week.   glipiZIDE (GLUCOTROL) 10 MG tablet Take 1 tablet (10 mg total) by mouth 2 (two) times daily before a meal. Take 10 mg by mouth 2 (two) times daily before a meal.   lisinopril (ZESTRIL) 2.5 MG tablet Take 1 tablet (2.5 mg total) by mouth daily.   methocarbamol (ROBAXIN) 500 MG tablet Take 1 tablet (500 mg total) by mouth 2 (two) times daily.   metoprolol tartrate (LOPRESSOR) 100 MG tablet Take 1 tablet (100 mg total) by  mouth 2 (two) times daily.   nitroGLYCERIN (NITROSTAT) 0.4 MG SL tablet Place 1 tablet under the tongue every 5 (five) minutes as needed.   olopatadine (PATANOL) 0.1 % ophthalmic solution Place 1 drop into both eyes 2 (two) times daily.   Omega-3 Fatty Acids (FISH OIL) 1200 MG CAPS Take 2,000 mg by mouth daily. Pt takes 2 tabs daily   OVER THE COUNTER MEDICATION Beets supplement   rosuvastatin (CRESTOR) 20 MG tablet Take 1 tablet (20 mg total) by mouth daily.   sildenafil (VIAGRA) 50 MG tablet Take 1 tablet (50 mg total) by mouth daily as needed for erectile dysfunction.     Allergies  Allergen Reactions   Atorvastatin Other (See  Comments)    Pt reports causes myalgias.     Social History   Socioeconomic History   Marital status: Divorced    Spouse name: Not on file   Number of children: Not on file   Years of education: Not on file   Highest education level: Not on file  Occupational History   Not on file  Tobacco Use   Smoking status: Every Day    Current packs/day: 2.00    Average packs/day: 2.0 packs/day for 35.0 years (70.0 ttl pk-yrs)    Types: Cigarettes   Smokeless tobacco: Never   Tobacco comments:    Pt stated that he smokes  Vaping Use   Vaping status: Never Used  Substance and Sexual Activity   Alcohol use: Yes    Alcohol/week: 0.0 standard drinks of alcohol    Comment: 09/04/2016  "might have a few drinks/year; nothing since 05/2015"   Drug use: Yes    Types: Cocaine, Marijuana    Comment: 09/04/2016 "haven't touched drugs since 1988"   Sexual activity: Yes  Other Topics Concern   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   Social Drivers of Corporate investment banker Strain: Not on file  Food Insecurity: Not on file  Transportation Needs: Not on file  Physical Activity: Not on file  Stress: Not on file  Social Connections: Not on file  Intimate Partner Violence: Not on file     Review of Systems: General: negative for chills, fever, night sweats or weight changes.  Cardiovascular: negative for chest pain, dyspnea on exertion, edema, orthopnea, palpitations, paroxysmal nocturnal dyspnea or shortness of breath Dermatological: negative for rash Respiratory: negative for cough or wheezing Urologic: negative for hematuria Abdominal: negative for nausea, vomiting, diarrhea, bright red blood per rectum, melena, or hematemesis Neurologic: negative for visual changes, syncope, or dizziness All other systems reviewed and are otherwise negative except as noted above.    Blood pressure 116/72, pulse (!) 107, height 5\' 11"   (1.803 m), weight 250 lb (113.4 kg).  General appearance: alert and no distress Neck: no adenopathy, no carotid bruit, no JVD, supple, symmetrical, trachea midline, and thyroid not enlarged, symmetric, no tenderness/mass/nodules Lungs: clear to auscultation bilaterally Heart: irregularly irregular rhythm Extremities: extremities normal, atraumatic, no cyanosis or edema Pulses: Diminished left pedal pulse Skin: Skin color, texture, turgor normal. No rashes or lesions Neurologic: Grossly normal  EKG EKG Interpretation Date/Time:  Monday July 28 2023 08:57:14 EST Ventricular Rate:  107 PR Interval:    QRS Duration:  90 QT Interval:  350 QTC Calculation: 467 R Axis:   100  Text Interpretation: Atrial fibrillation with rapid ventricular response Rightward axis Nonspecific T wave abnormality When compared with ECG  of 10-Jun-2023 10:36, Nonspecific T wave abnormality now evident in Lateral leads Confirmed by Nanetta Batty [phone]) on 07/28/2023 9:10:51 AM    ASSESSMENT AND PLAN:   Hyperlipidemia History of hyperlipidemia on Crestor with lipid profile performed 06/01/2023 revealing a total cholesterol 126, LDL 68 and HDL 34.  Tobacco abuse History of ongoing tobacco abuse of 1-1/2 packs a day with early symptoms of COPD recalcitrant to risk factor modification.  PAF (paroxysmal atrial fibrillation) (HCC) History of persistent A-fib on Eliquis oral anticoagulation.  Essential hypertension History of essential hypertension with blood pressure measured today at 116/72.  He is on metoprolol and low-dose lisinopril.  Coronary artery disease due to lipid rich plaque History of CAD status post multiple coronary interventions in the past dating back to 2016 when he had an RCA stent.  He had RCA orbital atherectomy and restenting 09/05/2016.  His left coronary catheterization performed by Dr. [name] 10/16/2018 revealed patent RCA stents with 50% mid AV groove circumflex.  He denies chest  pain.  Ischemic cardiomyopathy Recent 2D echo revealed an EF of 35 to 40% performed 07/18/2023.  He had no significant valvular abnormalities.  He is on metoprolol and lisinopril.  I am referring him to the [facility] heart failure clinic for GDMT medication optimization.  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  I have conveyed this to him.  Claudication in peripheral vascular disease (HCC) History of peripheral arterial disease status post peripheral angiography performed by myself 03/27/2020 revealing a short segment left common iliac artery CTO which I was able to cross and stent using a VBX covered stent.  His Dopplers normalized after that.  Unfortunately, he does continue to smoke and has noticed recurrent left hip claudication with Dopplers performed 07/04/2023 revealing a decline in his left ABI from 1.06 down to 0.64.  He wishes to wait until the spring of next year before having this addressed endovascularly.     Runell Gess MD FACP,FACC,FAHA, [facility] 07/28/2023 9:31 AM

## 2023-07-28 NOTE — Assessment & Plan Note (Signed)
History of essential hypertension with blood pressure measured today at 116/72.  He is on metoprolol and low-dose lisinopril.

## 2023-07-28 NOTE — Assessment & Plan Note (Signed)
History of hyperlipidemia on Crestor with lipid profile performed 06/01/2023 revealing a total cholesterol 126, LDL 68 and HDL 34.

## 2023-07-28 NOTE — Assessment & Plan Note (Signed)
History of persistent A. fib on Eliquis oral anticoagulation.

## 2023-07-28 NOTE — Patient Instructions (Signed)
Medication Instructions:  Your physician recommends that you continue on your current medications as directed. Please refer to the Current Medication list given to you today.  *If you need a refill on your cardiac medications before your next appointment, please call your pharmacy*   Follow-Up: At [facility], you and your health needs are our priority.  As part of our continuing mission to provide you with exceptional heart care, we have created designated Provider Care Teams.  These Care Teams include your primary Cardiologist (physician) and Advanced Practice Providers (APPs -  Physician Assistants and Nurse Practitioners) who all work together to provide you with the care you need, when you need it.  We recommend signing up for the patient portal called "MyChart".  Sign up information is provided on this After Visit Summary.  MyChart is used to connect with patients for Virtual Visits (Telemedicine).  Patients are able to view lab/test results, encounter notes, upcoming appointments, etc.  Non-urgent messages can be sent to your provider as well.   To learn more about what you can do with MyChart, go to [email or web address].    Your next appointment:   4 month(s) (April 2025)  Provider:   Nanetta Batty, MD

## 2023-07-28 NOTE — Assessment & Plan Note (Signed)
History of CAD status post multiple coronary interventions in the past dating back to 2016 when he had an RCA stent.  He had RCA orbital atherectomy and restenting 09/05/2016.  His left coronary catheterization performed by Dr. [name] 10/16/2018 revealed patent RCA stents with 50% mid AV groove circumflex.  He denies chest pain.

## 2023-07-28 NOTE — Assessment & Plan Note (Signed)
Recent 2D echo revealed an EF of 35 to 40% performed 07/18/2023.  He had no significant valvular abnormalities.  He is on metoprolol and lisinopril.  I am referring him to the [facility] heart failure clinic for GDMT medication optimization.  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  I have conveyed this to him.

## 2023-07-28 NOTE — Assessment & Plan Note (Signed)
History of ongoing tobacco abuse of 1-1/2 packs a day with early symptoms of COPD recalcitrant to risk factor modification.

## 2023-07-28 NOTE — Assessment & Plan Note (Signed)
History of peripheral arterial disease status post peripheral angiography performed by myself 03/27/2020 revealing a short segment left common iliac artery CTO which I was able to cross and stent using a VBX covered stent.  His Dopplers normalized after that.  Unfortunately, he does continue to smoke and has noticed recurrent left hip claudication with Dopplers performed 07/04/2023 revealing a decline in his left ABI from 1.06 down to 0.64.  He wishes to wait until the spring of next year before having this addressed endovascularly.

## 2023-07-28 NOTE — Telephone Encounter (Signed)
All concerns discussed at office visit on 07/28/23.

## 2023-07-29 ENCOUNTER — Telehealth: Payer: Self-pay | Admitting: Licensed Clinical Social Worker

## 2023-07-30 NOTE — Telephone Encounter (Signed)
H&V Care Navigation CSW Progress Note  Clinical Social Worker contacted patient by phone to f/u after appt as pt is self pay. May benefit from assistance programs for medical bills and medications. No answer today at [phone]. Will attempt one more time to reach pt.  Patient is participating in a Managed Medicaid Plan:  No, self pay only  SDOH Screenings   Tobacco Use: High Risk (07/28/2023)    Ian [name], MSW, LCSW Clinical Social Worker II [facility] Heart/Vascular Care Navigation  [phone]- work cell phone (preferred) [phone]- desk phone

## 2023-07-31 ENCOUNTER — Telehealth: Payer: Self-pay | Admitting: Licensed Clinical Social Worker

## 2023-07-31 NOTE — Telephone Encounter (Signed)
H&V Care Navigation CSW Progress Note  Clinical Social Worker contacted patient by phone to f/u after appt as pt is self pay. May benefit from assistance programs for medical bills and medications. No answer again today at [phone].  Mailed Medicaid flyer and [employer] application as well as my card as needed. Remain available should pt wish to engage in care navigation support.    Patient is participating in a Managed Medicaid Plan:  No, self pay only SDOH Screenings   Tobacco Use: High Risk (07/28/2023)    [name], MSW, LCSW Clinical Social Worker II [facility] Heart/Vascular Care Navigation  [phone]- work cell phone (preferred) [phone]- desk phone

## 2023-08-17 ENCOUNTER — Emergency Department (HOSPITAL_COMMUNITY)
Admission: EM | Admit: 2023-08-17 | Discharge: 2023-08-17 | Discharge status: Against Medical Advice | Payer: Self-pay | Attending: Emergency Medicine | Admitting: Emergency Medicine

## 2023-08-17 DIAGNOSIS — M25511 Pain in right shoulder: Secondary | ICD-10-CM | POA: Insufficient documentation

## 2023-08-17 DIAGNOSIS — Z5321 Procedure and treatment not carried out due to patient leaving prior to being seen by health care provider: Secondary | ICD-10-CM | POA: Insufficient documentation

## 2023-08-17 NOTE — ED Provider Notes (Signed)
 Patient eloped prior to being evaluated.    Achille Rich, [name] 08/17/23 1720    Tegeler, Canary Brim, MD 08/17/23 2138

## 2023-08-17 NOTE — ED Triage Notes (Signed)
 Pt c/o R shoulder pain for two months. Pain now is radiating down his arm and paresthesias.

## 2023-08-18 ENCOUNTER — Encounter: Payer: Self-pay | Admitting: Family Medicine

## 2023-08-18 ENCOUNTER — Inpatient Hospital Stay: Payer: Self-pay | Attending: Hematology | Admitting: Hematology

## 2023-08-18 ENCOUNTER — Ambulatory Visit (INDEPENDENT_AMBULATORY_CARE_PROVIDER_SITE_OTHER): Payer: No Typology Code available for payment source | Admitting: Family Medicine

## 2023-08-18 VITALS — BP 142/100 | HR 90 | Temp 97.9°F | Ht 71.0 in | Wt 254.5 lb

## 2023-08-18 DIAGNOSIS — M5412 Radiculopathy, cervical region: Secondary | ICD-10-CM

## 2023-08-18 MED ORDER — LANCETS MISC. MISC: 1.0000 | Freq: Three times a day (TID) | 0 refills | Status: AC

## 2023-08-18 MED ORDER — PREDNISONE 20 MG PO TABS: ORAL_TABLET | ORAL | 0 refills | Status: DC

## 2023-08-18 MED ORDER — METFORMIN HCL 1000 MG PO TABS: 1000.0000 mg | ORAL_TABLET | Freq: Two times a day (BID) | ORAL | 3 refills | Status: DC

## 2023-08-18 MED ORDER — LANCET DEVICE MISC: 1.0000 | Freq: Three times a day (TID) | 0 refills | Status: AC

## 2023-08-18 MED ORDER — BLOOD GLUCOSE TEST VI STRP: 1.0000 | ORAL_STRIP | Freq: Three times a day (TID) | 0 refills | Status: AC

## 2023-08-18 MED ORDER — BLOOD GLUCOSE MONITORING SUPPL DEVI: 1.0000 | Freq: Three times a day (TID) | 0 refills | Status: DC

## 2023-08-18 NOTE — Progress Notes (Signed)
 Subjective:    Patient ID: Ian [name], male    DOB: 04-Oct-1970, 53 y.o.   MRN: [id number]  Arm Pain    Patient is a 53 year old Caucasian gentleman with a history of atrial fibrillation currently on Eliquis , poorly controlled diabetes with a last A1c greater than 8, hypertension, and chronic kidney disease.  He presents today complaining of right arm pain.  The pain originates in his right tricep radiates into his right shoulder and then radiates down his arm all the way to his fingertips.  He reports numbness and tingling radiating down his arm into his fingertips.  The patient has normal reflexes of his biceps and brachioradialis.  He has normal sensation.  He has normal grip strength.  Muscle strength is 5/5 equal and symmetric in the upper extremities.  He has a negative Hoffmann sign.  He has negative empty can sign.  He does have a positive Spurling sign.  Patient has no insurance.  He has been taking Aleve  for the pain.  He is not monitoring her.  He recently went to an urgent care where he was given what sounds like Depo-Medrol and muscle relaxers.  The Depo-Medrol seem to help the pain for a day or so.  Past Medical History:  Diagnosis Date   Arthritis    left ankle (09/04/2016)   Cardiomyopathy (HCC)    a. EF 40-45% by prior echo. .b. 50-55% in 02/2020.   Complication of anesthesia    hard to get me out of it sometimes; usually takes me longer to get under   Coronary artery disease    a. multiple PCIs to RCA.   Essential hypertension 12/19/2015   Hyperlipidemia 05/21/2015   Hypertension    NSTEMI (non-ST elevated myocardial infarction) (HCC) 05/2015   thelbert 05/23/2015 minor one   PAD (peripheral artery disease) (HCC)    PAF (paroxysmal atrial fibrillation) (HCC)    Tobacco abuse 05/21/2015   Type II diabetes mellitus (HCC)    Past Surgical History:  Procedure Laterality Date   ABDOMINAL AORTOGRAM W/LOWER EXTREMITY Bilateral 03/27/2020   Procedure: ABDOMINAL AORTOGRAM  W/LOWER EXTREMITY;  Surgeon: Court Dorn PARAS, MD;  Location: [facility] INVASIVE CV LAB;  Service: Cardiovascular;  Laterality: Bilateral;   CARDIAC CATHETERIZATION  2000, 2005   less than 20% blockage   CARDIAC CATHETERIZATION N/A 05/22/2015   Procedure: Left Heart Cath and Coronary Angiography;  Surgeon: Candyce GORMAN Reek, MD;  Location: [facility] INVASIVE CV LAB;  Service: Cardiovascular;  Laterality: N/A;   CARDIAC CATHETERIZATION N/A 05/22/2015   Procedure: Coronary Stent Intervention;  Surgeon: Candyce GORMAN Reek, MD;  Location: [facility] INVASIVE CV LAB;  Service: Cardiovascular;  Laterality: N/A;   CARDIAC CATHETERIZATION N/A 09/04/2016   Procedure: Left Heart Cath and Coronary Angiography;  Surgeon: Candyce GORMAN Reek, MD;  Location: [facility] INVASIVE CV LAB;  Service: Cardiovascular;  Laterality: N/A;   CARDIAC CATHETERIZATION N/A 09/04/2016   Procedure: Coronary Balloon Angioplasty;  Surgeon: Candyce GORMAN Reek, MD;  Location: [facility] INVASIVE CV LAB;  Service: Cardiovascular;  Laterality: N/A;   CARDIAC CATHETERIZATION N/A 09/05/2016   Procedure: Coronary Atherectomy;  Surgeon: Alm LELON Clay, MD;  Location: [facility] INVASIVE CV LAB;  Service: Cardiovascular;  Laterality: N/A;   CARDIAC CATHETERIZATION N/A 09/05/2016   Procedure: Coronary Stent Intervention;  Surgeon: Alm LELON Clay, MD;  Location: [facility] INVASIVE CV LAB;  Service: Cardiovascular;  Laterality: N/A;   CARDIAC CATHETERIZATION N/A 09/05/2016   Procedure: Temporary Pacemaker;  Surgeon: Alm LELON Clay, MD;  Location: [facility] INVASIVE  CV LAB;  Service: Cardiovascular;  Laterality: N/A;   CORONARY ANGIOPLASTY     CORONARY PRESSURE/FFR STUDY N/A 10/16/2018   Procedure: INTRAVASCULAR PRESSURE WIRE/FFR STUDY;  Surgeon: Dann Candyce RAMAN, MD;  Location: [facility] INVASIVE CV LAB;  Service: Cardiovascular;  Laterality: N/A;   LEFT HEART CATH AND CORONARY ANGIOGRAPHY N/A 10/16/2018   Procedure: LEFT HEART CATH AND CORONARY ANGIOGRAPHY;  Surgeon: Dann Candyce RAMAN, MD;   Location: [facility] INVASIVE CV LAB;  Service: Cardiovascular;  Laterality: N/A;   MULTIPLE TOOTH EXTRACTIONS  2013   all of them   PERIPHERAL VASCULAR INTERVENTION  03/27/2020   Procedure: PERIPHERAL VASCULAR INTERVENTION;  Surgeon: Court Dorn PARAS, MD;  Location: [facility] INVASIVE CV LAB;  Service: Cardiovascular;;  Left iliac artery    TYMPANOSTOMY TUBE PLACEMENT Bilateral 1970s   Current Outpatient Medications on File Prior to Visit  Medication Sig Dispense Refill   albuterol  (VENTOLIN  HFA) 108 (90 Base) MCG/ACT inhaler Inhale 1-2 puffs into the lungs every 6 (six) hours as needed for wheezing or shortness of breath.      apixaban  (ELIQUIS ) 5 MG TABS tablet Take 1 tablet by mouth twice daily 180 tablet 1   Ascorbic Acid (VITAMIN C) 1000 MG tablet Take 1,000 mg by mouth daily.     Cinnamon 500 MG capsule Take 500 mg by mouth 2 (two) times daily.      clopidogrel  (PLAVIX ) 75 MG tablet Take 1 tablet (75 mg total) by mouth daily. 90 tablet 3   dapagliflozin  propanediol (FARXIGA ) 5 MG TABS tablet Take 2 tablets (10 mg total) by mouth daily. Take 10 mg by mouth daily. 30 tablet 90   furosemide  (LASIX ) 40 MG tablet Only as needed for weight gain of 3 lbs over night and/or 5 lbs over the week. 90 tablet 3   glipiZIDE  (GLUCOTROL ) 10 MG tablet Take 1 tablet (10 mg total) by mouth 2 (two) times daily before a meal. Take 10 mg by mouth 2 (two) times daily before a meal. 90 tablet 0   lisinopril  (ZESTRIL ) 2.5 MG tablet Take 1 tablet (2.5 mg total) by mouth daily. 90 tablet 0   methocarbamol  (ROBAXIN ) 500 MG tablet Take 1 tablet (500 mg total) by mouth 2 (two) times daily. 20 tablet 0   metoprolol  tartrate (LOPRESSOR ) 100 MG tablet Take 1 tablet (100 mg total) by mouth 2 (two) times daily. 180 tablet 3   nitroGLYCERIN  (NITROSTAT ) 0.4 MG SL tablet Place 1 tablet under the tongue every 5 (five) minutes as needed.     olopatadine  (PATANOL) 0.1 % ophthalmic solution Place 1 drop into both eyes 2 (two) times daily. 5  mL 12   Omega-3 Fatty Acids (FISH OIL) 1200 MG CAPS Take 2,000 mg by mouth daily. Pt takes 2 tabs daily     OVER THE COUNTER MEDICATION Beets supplement     rosuvastatin  (CRESTOR ) 20 MG tablet Take 1 tablet (20 mg total) by mouth daily. 90 tablet 0   sildenafil  (VIAGRA ) 50 MG tablet Take 1 tablet (50 mg total) by mouth daily as needed for erectile dysfunction. 10 tablet 6   No current facility-administered medications on file prior to visit.   Allergies  Allergen Reactions   Atorvastatin  Other (See Comments)    Pt reports causes myalgias.    Social History   Socioeconomic History   Marital status: Divorced    Spouse name: Not on file   Number of children: Not on file   Years of education: Not on file   Highest  education level: Not on file  Occupational History   Not on file  Tobacco Use   Smoking status: Every Day    Current packs/day: 2.00    Average packs/day: 2.0 packs/day for 35.0 years (70.0 ttl pk-yrs)    Types: Cigarettes   Smokeless tobacco: Never   Tobacco comments:    Pt stated that he smokes  Vaping Use   Vaping status: Never Used  Substance and Sexual Activity   Alcohol use: Yes    Alcohol/week: 0.0 standard drinks of alcohol    Comment: 09/04/2016  might have a few drinks/year; nothing since 05/2015   Drug use: Yes    Types: Cocaine, Marijuana    Comment: 09/04/2016 haven't touched drugs since 1988   Sexual activity: Yes  Other Topics Concern   Not on file  Social History Narrative   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 [place] .   Social Drivers of Corporate Investment Banker Strain: Not on file  Food Insecurity: Not on file  Transportation Needs: Not on file  Physical Activity: Not on file  Stress: Not on file  Social Connections: Not on file  Intimate Partner Violence: Not on file    Review of Systems  All other systems reviewed and are negative. What else upon doing the right on which is consistent with fractures think about it  picker's nodules.  When asked     Objective:   Physical Exam Vitals reviewed.  Cardiovascular:     Rate and Rhythm: Normal rate and regular rhythm.  Musculoskeletal:     Right shoulder: No tenderness, bony tenderness or crepitus. Normal range of motion. Normal strength.     Left shoulder: No tenderness, bony tenderness or crepitus. Normal range of motion. Normal strength.     Right upper arm: No deformity, lacerations or tenderness.     Left upper arm: No deformity, lacerations or tenderness.     Right hand: Normal range of motion. Normal strength. Normal sensation.     Left hand: Normal range of motion. Normal strength. Normal sensation.     Cervical back: Pain with movement present. No spinous process tenderness or muscular tenderness. Decreased range of motion.           Assessment & Plan:  Cervical radiculopathy Patient has a difficult case.  He has been taking NSAIDs.  I counseled the patient with NSAIDs increases risk of GI bleeding and exacerbate his chronic kidney disease.  He has poorly controlled diabetes.  Steroids were only exacerbate his.  Muscle relaxers, Tylenol , and pain pills have not helped the patient.  Therefore together we decided to try a brief prednisone  taper pack for 6 days.  Also give the patient a glucometer and testing supplies that he can monitor his sugars.  As long as his sugars do not spiral into a dangerous level, I would simply monitor them as I would anticipate that they would return to their baseline after he stopped the steroids.  Hopefully this will give him temporary relief until his insurance kicks in in February.  That point I would proceed with imaging of the neck to evaluate further.

## 2023-08-28 ENCOUNTER — Telehealth: Payer: Self-pay | Admitting: Cardiovascular Disease

## 2023-08-28 ENCOUNTER — Other Ambulatory Visit: Payer: Self-pay | Admitting: Family Medicine

## 2023-08-28 ENCOUNTER — Other Ambulatory Visit (HOSPITAL_COMMUNITY): Payer: Self-pay

## 2023-08-28 MED ORDER — APIXABAN 5 MG PO TABS
5.0000 mg | ORAL_TABLET | Freq: Two times a day (BID) | ORAL | 1 refills | Status: DC
  Filled 2023-08-28: qty 60, 30d supply, fill #0

## 2023-08-28 MED ORDER — APIXABAN 5 MG PO TABS: 5.0000 mg | ORAL_TABLET | Freq: Two times a day (BID) | ORAL | 0 refills | Status: DC

## 2023-08-28 MED ORDER — GLIPIZIDE 10 MG PO TABS: 10.0000 mg | ORAL_TABLET | Freq: Two times a day (BID) | ORAL | 0 refills | Status: DC

## 2023-08-28 NOTE — Telephone Encounter (Signed)
Prescription refill request for Eliquis received. Indication: PAF Last office visit: 07/28/23  Erlene Quan MD Scr: 0.91 on 07/18/23  Epic Age: 53 Weight: 113.4kg  Based on above findings Eliquis 5mg  twice daily is the appropriate dose.  Refill approved.

## 2023-08-28 NOTE — Telephone Encounter (Signed)
*  STAT* If patient is at the pharmacy, call can be transferred to refill team.   1. Which medications need to be refilled? (please list name of each medication and dose if known)   apixaban (ELIQUIS) 5 MG TABS tablet    2. Which pharmacy/location (including street and city if local pharmacy) is medication to be sent to?  [facility] [address] [place] - [id number] [place] [address] Phone: [phone]  Fax: [phone]      3. Do they need a 30 day or 90 day supply? 30 Has been out for about 1 month been taking a baby aspirin and plavix

## 2023-08-28 NOTE — Telephone Encounter (Signed)
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 Needing to discuss best way to get 53yrs of records as well for DOT.

## 2023-08-28 NOTE — Telephone Encounter (Signed)
Called pt. 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 Pt given Medical reocrds number to obtain his records.  Pt states "I've been off of my Eliquis for about a month. But I have been taking Clopidogrel and Asprin. If he can send a month supply I should be able to get that. I don't want to push it and ask for samples." Samples placed at front desk for the patient. Pt verbalized understanding and thanked me for giving samples.

## 2023-08-29 MED ORDER — METFORMIN HCL 1000 MG PO TABS: 1000.0000 mg | ORAL_TABLET | Freq: Two times a day (BID) | ORAL | 5 refills | Status: DC

## 2023-08-29 MED ORDER — ROSUVASTATIN CALCIUM 20 MG PO TABS: 20.0000 mg | ORAL_TABLET | Freq: Every day | ORAL | 0 refills | Status: DC

## 2023-09-03 ENCOUNTER — Other Ambulatory Visit: Payer: Self-pay | Admitting: Pharmacist

## 2023-09-03 NOTE — Telephone Encounter (Signed)
Called pt. He states "why would he tell me it was fine for me to drive. Now he is going back on his word?"; "I asked him was it going to effect my driving and he said it wouldn't" Pt is requesting a phone call for clarification from the provider.

## 2023-09-08 ENCOUNTER — Ambulatory Visit: Payer: Self-pay | Admitting: Family Medicine

## 2023-09-08 ENCOUNTER — Institutional Professional Consult (permissible substitution): Payer: Self-pay | Admitting: Internal Medicine

## 2023-09-09 ENCOUNTER — Other Ambulatory Visit (HOSPITAL_COMMUNITY): Payer: Self-pay

## 2023-09-09 NOTE — Progress Notes (Signed)
09/03/2023 Name: Ian [name] MRN: [id number] DOB: 1971-03-28  Chief Complaint  Patient presents with   Medication Management    Ian [name] is a 53 y.o. year old male who presented for a telephone visit.   They were referred to the pharmacist by their PCP for assistance in managing medication access and complex medication management.    Subjective:  Care Team: Primary Care Provider: Park Meo, FNP   Medication Access/Adherence  Current Pharmacy:  [facility] [address] [place] - [address] [place] [address] [place] [address] Phone: [phone] Fax: [phone]   Patient reports affordability concerns with their medications: Yes  Patient reports access/transportation concerns to their pharmacy: No  Patient reports adherence concerns with their medications:  Yes  only bc no insurance   Medication Management:   Current adherence strategy: none; [facility]   Patient reports adherence to medications   Patient reports the following barriers to adherence: lack of insurance--patient states his insurance will be active 09/17/23     Objective:  Lab Results  Component Value Date   HGBA1C 8.9 (H) 06/09/2023    Lab Results  Component Value Date   CREATININE 0.91 07/18/2023   BUN 17 07/18/2023   NA 139 07/18/2023   K 4.2 07/18/2023   CL 101 07/18/2023   CO2 24 07/18/2023    Lab Results  Component Value Date   CHOL 126 06/09/2023   HDL 34 (L) 06/09/2023   LDLCALC 68 06/09/2023   TRIG 164 (H) 06/09/2023   CHOLHDL 3.7 06/09/2023    Medications Reviewed Today     Reviewed by Danella Maiers, [facility] (Pharmacist) on 09/09/23 at 1141  Med List Status: <None>   Medication Order Taking? Sig Documenting Provider Last Dose Status Informant  albuterol (VENTOLIN HFA) 108 (90 Base) MCG/ACT inhaler [id number] No Inhale 1-2 puffs into the lungs every 6 (six) hours as needed for wheezing or shortness of breath.  [provider] Taking Active Spouse/Significant Other  apixaban (ELIQUIS) 5 MG TABS tablet [id number]  Take 1 tablet (5 mg total) by mouth 2 (two) times daily. Runell Gess, MD  Active   apixaban (ELIQUIS) 5 MG TABS tablet [id number]  Take 1 tablet (5 mg total) by mouth 2 (two) times daily. Runell Gess, MD  Active   Ascorbic Acid (VITAMIN C) 1000 MG tablet [id number] No Take 1,000 mg by mouth daily. [provider] Taking Active   Blood Glucose Monitoring Suppl DEVI [id number]  1 each by Does not apply route in the morning, at noon, and at bedtime. May substitute to any manufacturer covered by patient's insurance. Donita Brooks, MD  Active   Cinnamon 500 MG capsule [id number] No Take 500 mg by mouth 2 (two) times daily.  [provider] Taking Active Spouse/Significant Other  clopidogrel (PLAVIX) 75 MG tablet [id number] No Take 1 tablet (75 mg total) by mouth daily. Jonita Albee, [name] Taking Active   dapagliflozin propanediol (FARXIGA) 5 MG TABS tablet [id number] No Take 2 tablets (10 mg total) by mouth daily. Take 10 mg by mouth daily.  Patient not taking: Reported on 08/29/2023   Donita Brooks, MD Not Taking Active   furosemide (LASIX) 40 MG tablet [id number] No Only as needed for weight gain of 3 lbs over night and/or 5 lbs over the week. Jonita Albee, [name] Taking Active   glipiZIDE (GLUCOTROL) 10 MG tablet [id number] No Take 1 tablet (10 mg total)  by mouth 2 (two) times daily before a meal. Take 10 mg by mouth 2 (two) times daily before a meal. Donita Brooks, MD Taking Active   Glucose Blood (BLOOD GLUCOSE TEST STRIPS) STRP [id number]  1 each by In Vitro route in the morning, at noon, and at bedtime. May substitute to any manufacturer covered by patient's insurance. Donita Brooks, MD  Active   Lancet Device MISC [id number]  1 each by Does not apply route in the morning, at noon, and at bedtime. May substitute to any manufacturer covered by patient's  insurance. Donita Brooks, MD  Active   Lancets Misc. MISC [id number]  1 each by Does not apply route in the morning, at noon, and at bedtime. May substitute to any manufacturer covered by patient's insurance. Donita Brooks, MD  Active   lisinopril (ZESTRIL) 2.5 MG tablet [id number] No Take 1 tablet (2.5 mg total) by mouth daily. Park Meo, FNP Taking Active   metFORMIN (GLUCOPHAGE) 1000 MG tablet [id number]  Take 1 tablet (1,000 mg total) by mouth 2 (two) times daily with a meal. Donita Brooks, MD  Active   methocarbamol (ROBAXIN) 500 MG tablet [id number] No Take 1 tablet (500 mg total) by mouth 2 (two) times daily. [place], [place] N, FNP Taking Active   metoprolol tartrate (LOPRESSOR) 100 MG tablet [id number] No Take 1 tablet (100 mg total) by mouth 2 (two) times daily. [name], MD Taking Active Spouse/Significant Other  nitroGLYCERIN (NITROSTAT) 0.4 MG SL tablet [id number] No Place 1 tablet under the tongue every 5 (five) minutes as needed. [provider] Taking Active   olopatadine (PATANOL) 0.1 % ophthalmic solution [id number] No Place 1 drop into both eyes 2 (two) times daily. Park Meo, FNP Taking Active   Omega-3 Fatty Acids (FISH OIL) 1200 MG CAPS [id number] No Take 2,000 mg by mouth daily. Pt takes 2 tabs daily [provider] Taking Active Spouse/Significant Other  OVER THE COUNTER MEDICATION [id number] No Beets supplement [provider] Taking Active   predniSONE (DELTASONE) 20 MG tablet [id number]  3 tabs poqday 1-2, 2 tabs poqday 3-4, 1 tab poqday 5-6 Donita Brooks, MD  Active   rosuvastatin (CRESTOR) 20 MG tablet [id number]  Take 1 tablet (20 mg total) by mouth daily. Park Meo, FNP  Active   sildenafil (VIAGRA) 50 MG tablet [id number] No Take 1 tablet (50 mg total) by mouth daily as needed for erectile dysfunction. [name], MD Taking Active Spouse/Significant Other  Med List Note Melven Sartorius, [place] 05/21/15  1017): No pharmacy preferred             Assessment/Plan:   Medication Management: - Currently strategy and no insurance makes it challenging to adhere to prescribed medication regimen - Patient can utilize mychart to create a list of medication, indication, and administration time.  - [facility] -instructed patient to reach out to cardiology if unable to obtain eliquis due to cost -patient reports he will likely regain insurance in February but remains compliant with regimen--has generic meds and is taking them   Kieth Brightly, PharmD, BCACP, CPP Clinical Pharmacist, [facility]

## 2023-09-14 ENCOUNTER — Encounter (HOSPITAL_COMMUNITY): Payer: Self-pay

## 2023-09-14 ENCOUNTER — Emergency Department (HOSPITAL_COMMUNITY)
Admission: EM | Admit: 2023-09-14 | Discharge: 2023-09-14 | Disposition: A | Discharge status: Home / Self Care | Payer: Self-pay | Attending: Emergency Medicine | Admitting: Emergency Medicine

## 2023-09-14 ENCOUNTER — Emergency Department (HOSPITAL_COMMUNITY): Payer: Self-pay

## 2023-09-14 ENCOUNTER — Other Ambulatory Visit: Payer: Self-pay

## 2023-09-14 DIAGNOSIS — E119 Type 2 diabetes mellitus without complications: Secondary | ICD-10-CM | POA: Insufficient documentation

## 2023-09-14 DIAGNOSIS — Z79899 Other long term (current) drug therapy: Secondary | ICD-10-CM | POA: Insufficient documentation

## 2023-09-14 DIAGNOSIS — Z7984 Long term (current) use of oral hypoglycemic drugs: Secondary | ICD-10-CM | POA: Insufficient documentation

## 2023-09-14 DIAGNOSIS — I2510 Atherosclerotic heart disease of native coronary artery without angina pectoris: Secondary | ICD-10-CM | POA: Insufficient documentation

## 2023-09-14 DIAGNOSIS — J449 Chronic obstructive pulmonary disease, unspecified: Secondary | ICD-10-CM | POA: Insufficient documentation

## 2023-09-14 DIAGNOSIS — I509 Heart failure, unspecified: Secondary | ICD-10-CM | POA: Insufficient documentation

## 2023-09-14 DIAGNOSIS — Z7902 Long term (current) use of antithrombotics/antiplatelets: Secondary | ICD-10-CM | POA: Insufficient documentation

## 2023-09-14 DIAGNOSIS — M25511 Pain in right shoulder: Secondary | ICD-10-CM | POA: Insufficient documentation

## 2023-09-14 DIAGNOSIS — Z7901 Long term (current) use of anticoagulants: Secondary | ICD-10-CM | POA: Insufficient documentation

## 2023-09-14 DIAGNOSIS — I110 Hypertensive heart disease with heart failure: Secondary | ICD-10-CM | POA: Insufficient documentation

## 2023-09-14 DIAGNOSIS — F17210 Nicotine dependence, cigarettes, uncomplicated: Secondary | ICD-10-CM | POA: Insufficient documentation

## 2023-09-14 MED ORDER — OXYCODONE-ACETAMINOPHEN 5-325 MG PO TABS: 1.0000 | ORAL_TABLET | Freq: Four times a day (QID) | ORAL | 0 refills | Status: DC | PRN

## 2023-09-14 MED ORDER — NAPROXEN 250 MG PO TABS
500.0000 mg | ORAL_TABLET | Freq: Once | ORAL | Status: AC
  Administered 2023-09-14: 500 mg via ORAL
  Filled 2023-09-14: qty 2

## 2023-09-14 NOTE — Discharge Instructions (Signed)
We evaluated you for your arm pain.  We do not know the exact cause of your pain.  Your x-rays did not show any fracture or dislocation.  There were some signs of arthritis in your shoulder.  Your examination also suggested that you may have carpal tunnel syndrome, which can sometimes cause pain that radiates into your shoulder joint.  I have given you small amount of Percocet for your symptoms at home.  Please take this as prescribed.  Do not mix with alcohol or drive while taking this medication as it may make you sleepy.  Please continue to follow-up with an orthopedic surgeon.  If you develop any new or worsening symptoms, such as fevers or chills, inability to move your shoulder, swelling in your arm, color change in your arm, or any other concerning symptoms please return to the emergency department.

## 2023-09-14 NOTE — ED Triage Notes (Signed)
Pt c.o continuous right shoulder pain from a workers comp injury in December. Pt has seen 3 different doctors since and has been told three different diagnoses.

## 2023-09-14 NOTE — ED Provider Notes (Signed)
[place] EMERGENCY DEPARTMENT AT [facility] Provider Note  CSN: [id number] Arrival date & time: 09/14/23 1015  Chief Complaint(s) Shoulder Pain  HPI Ian [name] is a 53 y.o. male history of CHF, hypertension, hyperlipidemia, A-fib on Eliquis, COPD presenting with right shoulder pain.  He reports that he has had shoulder pain for around a month.  Reports that it began after he was using a ratcheting mechanism on his tractor trailer.  He has been going through [employer]. but has been taking a while.  Has not had any x-rays or imaging yet.  Reports that he has some numbness radiating down his right arm as well.  No fevers, chills.  No new trauma.  No falls.  Has been taking naproxen which helps some.   Past Medical History Past Medical History:  Diagnosis Date   Arthritis    "left ankle" (09/04/2016)   Cardiomyopathy (HCC)    a. EF 40-45% by prior echo. .b. 50-55% in 02/2020.   Complication of anesthesia    "hard to get me out of it sometimes; usually takes me longer to get under"   Coronary artery disease    a. multiple PCIs to RCA.   Essential hypertension 12/19/2015   Hyperlipidemia 05/21/2015   Hypertension    NSTEMI (non-ST elevated myocardial infarction) (HCC) 05/2015   [name] 05/23/2015 "minor one"   PAD (peripheral artery disease) (HCC)    PAF (paroxysmal atrial fibrillation) (HCC)    Tobacco abuse 05/21/2015   Type II diabetes mellitus [facility])    Patient Active Problem List   Diagnosis Date Noted   Chronic obstructive pulmonary disease (HCC) 06/09/2023   Cigarette nicotine dependence without complication 06/09/2023   Physical exam, annual 06/09/2023   Acute bacterial conjunctivitis of right eye 06/09/2023   Claudication in peripheral vascular disease (HCC) 03/27/2020   Peripheral arterial disease (HCC) 03/15/2020   Dyspnea on exertion 10/27/2018   Chest pain 04/22/2017   Abnormal nuclear stress test 09/06/2016   S/P PTCA (percutaneous transluminal  coronary angioplasty)    PAF (paroxysmal atrial fibrillation) (HCC) 12/19/2015   Essential hypertension 12/19/2015   Coronary artery disease due to lipid rich plaque 12/19/2015   Ischemic cardiomyopathy 12/19/2015   Hyperlipidemia 05/21/2015   Type 2 diabetes mellitus with complication, without long-term current use of insulin (HCC) 05/21/2015   Tobacco abuse 05/21/2015   Home Medication(s) Prior to Admission medications   Medication Sig Start Date End Date Taking? Authorizing Provider  oxyCODONE-acetaminophen (PERCOCET/ROXICET) 5-325 MG tablet Take 1 tablet by mouth every 6 (six) hours as needed for severe pain (pain score 7-10). 09/14/23  Yes Lonell Grandchild, MD  albuterol (VENTOLIN HFA) 108 (90 Base) MCG/ACT inhaler Inhale 1-2 puffs into the lungs every 6 (six) hours as needed for wheezing or shortness of breath.  03/03/19   [provider]  apixaban (ELIQUIS) 5 MG TABS tablet Take 1 tablet (5 mg total) by mouth 2 (two) times daily. 08/28/23   Runell Gess, MD  apixaban (ELIQUIS) 5 MG TABS tablet Take 1 tablet (5 mg total) by mouth 2 (two) times daily. 08/28/23   Runell Gess, MD  Ascorbic Acid (VITAMIN C) 1000 MG tablet Take 1,000 mg by mouth daily.    [provider]  Blood Glucose Monitoring Suppl DEVI 1 each by Does not apply route in the morning, at noon, and at bedtime. May substitute to any manufacturer covered by patient's insurance. 08/18/23   Donita Brooks, MD  Cinnamon 500 MG capsule Take  500 mg by mouth 2 (two) times daily.     [provider]  clopidogrel (PLAVIX) 75 MG tablet Take 1 tablet (75 mg total) by mouth daily. 06/10/23   Jonita Albee, [name]  dapagliflozin propanediol (FARXIGA) 5 MG TABS tablet Take 2 tablets (10 mg total) by mouth daily. Take 10 mg by mouth daily. Patient not taking: Reported on 08/29/2023 07/07/23   Donita Brooks, MD  furosemide (LASIX) 40 MG tablet Only as needed for weight gain of 3 lbs over night  and/or 5 lbs over the week. 06/10/23   Jonita Albee, [name]  glipiZIDE (GLUCOTROL) 10 MG tablet Take 1 tablet (10 mg total) by mouth 2 (two) times daily before a meal. Take 10 mg by mouth 2 (two) times daily before a meal. 08/28/23   Donita Brooks, MD  Glucose Blood (BLOOD GLUCOSE TEST STRIPS) STRP 1 each by In Vitro route in the morning, at noon, and at bedtime. May substitute to any manufacturer covered by patient's insurance. 08/18/23 09/17/23  Donita Brooks, MD  Lancet Device MISC 1 each by Does not apply route in the morning, at noon, and at bedtime. May substitute to any manufacturer covered by patient's insurance. 08/18/23 09/17/23  Donita Brooks, MD  Lancets Misc. MISC 1 each by Does not apply route in the morning, at noon, and at bedtime. May substitute to any manufacturer covered by patient's insurance. 08/18/23 09/17/23  Donita Brooks, MD  lisinopril (ZESTRIL) 2.5 MG tablet Take 1 tablet (2.5 mg total) by mouth daily. 07/04/23   Park Meo, FNP  metFORMIN (GLUCOPHAGE) 1000 MG tablet Take 1 tablet (1,000 mg total) by mouth 2 (two) times daily with a meal. 08/18/23   Donita Brooks, MD  methocarbamol (ROBAXIN) 500 MG tablet Take 1 tablet (500 mg total) by mouth 2 (two) times daily. 07/18/23   Garrison, [place] N, FNP  metoprolol tartrate (LOPRESSOR) 100 MG tablet Take 1 tablet (100 mg total) by mouth 2 (two) times daily. 03/03/20   [name], MD  nitroGLYCERIN (NITROSTAT) 0.4 MG SL tablet Place 1 tablet under the tongue every 5 (five) minutes as needed. 10/02/18   [provider]  olopatadine (PATANOL) 0.1 % ophthalmic solution Place 1 drop into both eyes 2 (two) times daily. 06/09/23   Park Meo, FNP  Omega-3 Fatty Acids (FISH OIL) 1200 MG CAPS Take 2,000 mg by mouth daily. Pt takes 2 tabs daily    [provider]  OVER THE COUNTER MEDICATION Beets supplement    [provider]  predniSONE (DELTASONE) 20 MG tablet 3 tabs poqday 1-2, 2 tabs  poqday 3-4, 1 tab poqday 5-6 08/18/23   Donita Brooks, MD  rosuvastatin (CRESTOR) 20 MG tablet Take 1 tablet (20 mg total) by mouth daily. 08/29/23   Park Meo, FNP  sildenafil (VIAGRA) 50 MG tablet Take 1 tablet (50 mg total) by mouth daily as needed for erectile dysfunction. 07/20/19   [name], MD  Past Surgical History Past Surgical History:  Procedure Laterality Date   ABDOMINAL AORTOGRAM W/LOWER EXTREMITY Bilateral 03/27/2020   Procedure: ABDOMINAL AORTOGRAM W/LOWER EXTREMITY;  Surgeon: Runell Gess, MD;  Location: [facility] INVASIVE CV LAB;  Service: Cardiovascular;  Laterality: Bilateral;   CARDIAC CATHETERIZATION  2000, 2005   "less than 20% blockage"   CARDIAC CATHETERIZATION N/A 05/22/2015   Procedure: Left Heart Cath and Coronary Angiography;  Surgeon: [name], MD;  Location: [facility] INVASIVE CV LAB;  Service: Cardiovascular;  Laterality: N/A;   CARDIAC CATHETERIZATION N/A 05/22/2015   Procedure: Coronary Stent Intervention;  Surgeon: [name], MD;  Location: [facility] INVASIVE CV LAB;  Service: Cardiovascular;  Laterality: N/A;   CARDIAC CATHETERIZATION N/A 09/04/2016   Procedure: Left Heart Cath and Coronary Angiography;  Surgeon: [name], MD;  Location: [facility] INVASIVE CV LAB;  Service: Cardiovascular;  Laterality: N/A;   CARDIAC CATHETERIZATION N/A 09/04/2016   Procedure: Coronary Balloon Angioplasty;  Surgeon: [name], MD;  Location: [facility] INVASIVE CV LAB;  Service: Cardiovascular;  Laterality: N/A;   CARDIAC CATHETERIZATION N/A 09/05/2016   Procedure: Coronary Atherectomy;  Surgeon: Marykay Lex, MD;  Location: [facility] INVASIVE CV LAB;  Service: Cardiovascular;  Laterality: N/A;   CARDIAC CATHETERIZATION N/A 09/05/2016   Procedure: Coronary Stent Intervention;  Surgeon: Marykay Lex, MD;  Location: [facility] INVASIVE  CV LAB;  Service: Cardiovascular;  Laterality: N/A;   CARDIAC CATHETERIZATION N/A 09/05/2016   Procedure: Temporary Pacemaker;  Surgeon: Marykay Lex, MD;  Location: [facility] INVASIVE CV LAB;  Service: Cardiovascular;  Laterality: N/A;   CORONARY ANGIOPLASTY     CORONARY PRESSURE/FFR STUDY N/A 10/16/2018   Procedure: INTRAVASCULAR PRESSURE WIRE/FFR STUDY;  Surgeon: [name], MD;  Location: [facility] INVASIVE CV LAB;  Service: Cardiovascular;  Laterality: N/A;   LEFT HEART CATH AND CORONARY ANGIOGRAPHY N/A 10/16/2018   Procedure: LEFT HEART CATH AND CORONARY ANGIOGRAPHY;  Surgeon: [name], MD;  Location: [facility] INVASIVE CV LAB;  Service: Cardiovascular;  Laterality: N/A;   MULTIPLE TOOTH EXTRACTIONS  2013   "all of them"   PERIPHERAL VASCULAR INTERVENTION  03/27/2020   Procedure: PERIPHERAL VASCULAR INTERVENTION;  Surgeon: Runell Gess, MD;  Location: [facility] INVASIVE CV LAB;  Service: Cardiovascular;;  Left iliac artery    TYMPANOSTOMY TUBE PLACEMENT Bilateral 1970s   Family History Family History  Problem Relation Age of Onset   Diabetes Mother    Heart attack Father    Diabetes Sister    Diabetes Maternal Grandmother     Social History Social History   Tobacco Use   Smoking status: Every Day    Current packs/day: 2.00    Average packs/day: 2.0 packs/day for 35.0 years (70.0 ttl pk-yrs)    Types: Cigarettes   Smokeless tobacco: Never   Tobacco comments:    Pt stated that he smokes  Vaping Use   Vaping status: Never Used  Substance Use Topics   Alcohol use: Yes    Alcohol/week: 0.0 standard drinks of alcohol    Comment: 09/04/2016  "might have a few drinks/year; nothing since 05/2015"   Drug use: Yes    Types: Cocaine, Marijuana    Comment: 09/04/2016 "haven't touched drugs since 1988"   Allergies Atorvastatin  Review of Systems Review of Systems  All other systems reviewed and are negative.   Physical Exam Vital Signs  I have reviewed the triage vital  signs BP (!) 144/96 (BP Location: Left Arm)   Pulse 78   Temp  98.2 F (36.8 C)   Resp 16   Ht 5\' 10"  (1.778 m)   Wt 113.4 kg   SpO2 96%   BMI 35.87 kg/m  Physical Exam Vitals and nursing note reviewed.  Constitutional:      General: He is not in acute distress.    Appearance: Normal appearance.  HENT:     Head: Normocephalic and atraumatic.     Mouth/Throat:     Mouth: Mucous membranes are moist.  Eyes:     Conjunctiva/sclera: Conjunctivae normal.  Cardiovascular:     Rate and Rhythm: Normal rate.  Pulmonary:     Effort: Pulmonary effort is normal. No respiratory distress.  Abdominal:     General: Abdomen is flat.  Musculoskeletal:     Comments: Range of motion of the right shoulder intact but slightly painful.  Full range of motion of the right elbow, wrist, fingers, no focal tenderness or deformity.  Positive Tinel's sign on the right wrist.  No objective sensory deficit.  2+ radial pulse.  No objective swelling  Skin:    General: Skin is warm and dry.     Capillary Refill: Capillary refill takes less than 2 seconds.  Neurological:     General: No focal deficit present.     Mental Status: He is alert. Mental status is at baseline.  Psychiatric:        Mood and Affect: Mood normal.        Behavior: Behavior normal.     ED Results and Treatments Labs (all labs ordered are listed, but only abnormal results are displayed) Labs Reviewed - No data to display                                                                                                                        Radiology DG Elbow Complete Right Result Date: 09/14/2023 CLINICAL DATA:  Right elbow pain. EXAM: RIGHT ELBOW - COMPLETE 3+ VIEW COMPARISON:  None Available. FINDINGS: There is no evidence of fracture or dislocation. Mild posterior fat pad displacement is noted suggesting joint effusion. Mild osteophyte formation is seen involving multiple joint spaces. Soft tissues are unremarkable. IMPRESSION: Mild  degenerative changes as noted above. Mild posterior fat pad displacement suggesting joint effusion. No fracture or dislocation. Electronically Signed   By: Lupita Raider M.D.   On: 09/14/2023 11:34   DG Shoulder Right Result Date: 09/14/2023 CLINICAL DATA:  Right shoulder pain after injury several months ago. EXAM: RIGHT SHOULDER - 2+ VIEW COMPARISON:  None Available. FINDINGS: There is no evidence of fracture or dislocation. Minimal spurring of right acromioclavicular joint is noted. Soft tissues are unremarkable. IMPRESSION: Minimal degenerative changes as noted above. No acute abnormality seen. Electronically Signed   By: Lupita Raider M.D.   On: 09/14/2023 11:32    Pertinent labs & imaging results that were available during my care of the patient were reviewed by me and considered in my medical decision making (see MDM for details).  Medications Ordered in ED Medications  naproxen (NAPROSYN) tablet 500 mg (500 mg Oral Given 09/14/23 1043)                                                                                                                                     Procedures Procedures  (including critical care time)  Medical Decision Making / ED Course   MDM:  53 year old presenting to the emergency department with right shoulder and arm pain.  Patient well-appearing, physical exam with some painful range of motion of the right shoulder.  X-ray of the shoulder, elbow shows some degenerative changes.  Differential includes osteoarthritis, rotator cuff pathology, pinched nerve.  Differentials includes carpal tunnel with radiation up the arm given his positive Tinel's sign.  Doubt circulatory issues with intact radial pulse.  Doubt DVT with no arm swelling.  Additionally patient is on chronic Eliquis.  Doubt septic joint with intact range of motion, no fevers or chills.  X-rays without fracture or dislocation.  Given his persistent pain, discussed self-care at home.  Reviewed PDMP,  will give very small amount of Percocet, recommended follow-up with orthopedic surgeon.  Given possibility of carpal tunnel as component will give a wrist brace as well. Will discharge patient to home. All questions answered. Patient comfortable with plan of discharge. Return precautions discussed with patient and specified on the after visit summary.        Imaging Studies ordered: I ordered imaging studies including XR shoulder, elbow  On my interpretation imaging demonstrates degenerative changes, ?elbow effusion without clinical signs of occult fracture  I independently visualized and interpreted imaging. I agree with the radiologist interpretation   Medicines ordered and prescription drug management: Meds ordered this encounter  Medications   naproxen (NAPROSYN) tablet 500 mg   oxyCODONE-acetaminophen (PERCOCET/ROXICET) 5-325 MG tablet    Sig: Take 1 tablet by mouth every 6 (six) hours as needed for severe pain (pain score 7-10).    Dispense:  12 tablet    Refill:  0    -I have reviewed the patients home medicines and have made adjustments as needed  Social Determinants of Health:  Diagnosis or treatment significantly limited by social determinants of health: obesity   Reevaluation: After the interventions noted above, I reevaluated the patient and found that their symptoms have improved  Co morbidities that complicate the patient evaluation  Past Medical History:  Diagnosis Date   Arthritis    "left ankle" (09/04/2016)   Cardiomyopathy (HCC)    a. EF 40-45% by prior echo. .b. 50-55% in 02/2020.   Complication of anesthesia    "hard to get me out of it sometimes; usually takes me longer to get under"   Coronary artery disease    a. multiple PCIs to RCA.   Essential hypertension 12/19/2015   Hyperlipidemia 05/21/2015   Hypertension    NSTEMI (non-ST elevated myocardial infarction) (HCC) 05/2015   [name] 05/23/2015 "minor  one"   PAD (peripheral artery disease) (HCC)     PAF (paroxysmal atrial fibrillation) (HCC)    Tobacco abuse 05/21/2015   Type II diabetes mellitus (HCC)       Dispostion: Disposition decision including need for hospitalization was considered, and patient discharged from emergency department.    Final Clinical Impression(s) / ED Diagnoses Final diagnoses:  Acute pain of right shoulder     This chart was dictated using voice recognition software.  Despite best efforts to proofread,  errors can occur which can change the documentation meaning.    Lonell Grandchild, MD 09/14/23 [phone]

## 2023-09-14 NOTE — ED Provider Triage Note (Signed)
Emergency Medicine Provider Triage Evaluation Note  Ural Acree , a 53 y.o. male  was evaluated in triage.  Pt complains of right shoulder pain.  Patient reports pain to the right shoulder with impaired ROM, right lateral elbow pain, and numbness and tingling of his right index and middle fingers for the past several months.  He has followed up with both urgent care and primary care for this but no one is ever done imaging.  Review of Systems  Positive: Right shoulder pain, right elbow pain Negative: Weakness, known injury  Physical Exam  BP (!) 130/98   Pulse (!) 114   Temp 98.7 F (37.1 C)   Resp 18   Ht 5\' 10"  (1.778 m)   Wt 113.4 kg   SpO2 99%   BMI 35.87 kg/m  Gen:   Awake, no distress   Resp:  Normal effort  MSK:   Moves extremities without difficulty  Other:    Medical Decision Making  Medically screening exam initiated at 10:38 AM.  Appropriate orders placed.  Jaclyn Carew was informed that the remainder of the evaluation will be completed by another provider, this initial triage assessment does not replace that evaluation, and the importance of remaining in the ED until their evaluation is complete.   Maxwell Marion, [name] 09/14/23 1040

## 2023-09-15 ENCOUNTER — Telehealth: Payer: Self-pay | Admitting: Family Medicine

## 2023-09-15 ENCOUNTER — Other Ambulatory Visit (HOSPITAL_COMMUNITY): Payer: Self-pay

## 2023-09-15 NOTE — Telephone Encounter (Signed)
Copied from CRM [id number]. Topic: General - Other >> Sep 15, 2023 11:20 AM Fuller Mandril wrote: Reason for CRM: Call from payer. Caller from [employer] to get all dates of service for patient. Thank You

## 2023-09-17 NOTE — Telephone Encounter (Signed)
 Attempted to return call to [employer]. LVM

## 2023-09-22 ENCOUNTER — Telehealth (HOSPITAL_COMMUNITY): Payer: Self-pay | Admitting: Cardiology

## 2023-09-22 NOTE — Telephone Encounter (Signed)
 Called patient at [phone] to remind patient of his appointment on Tuesday, September 23, 2023 at 10:40 AM.   Maribeth Shivers office staff confirmed appointment with patient over the telephone and front office has made patient aware of appointment location at entrance c on the backside of the [place] hospital.

## 2023-09-23 ENCOUNTER — Telehealth (HOSPITAL_COMMUNITY): Payer: Self-pay

## 2023-09-23 ENCOUNTER — Telehealth: Payer: Self-pay

## 2023-09-23 ENCOUNTER — Other Ambulatory Visit (HOSPITAL_COMMUNITY): Payer: Self-pay

## 2023-09-23 ENCOUNTER — Encounter (HOSPITAL_COMMUNITY): Payer: Self-pay | Admitting: Cardiology

## 2023-09-23 ENCOUNTER — Ambulatory Visit (HOSPITAL_COMMUNITY)
Admission: RE | Admit: 2023-09-23 | Discharge: 2023-09-23 | Disposition: A | Discharge status: Home / Self Care | Payer: No Typology Code available for payment source | Source: Ambulatory Visit | Attending: Cardiology | Admitting: Cardiology

## 2023-09-23 VITALS — BP 138/80 | HR 101 | Wt 256.6 lb

## 2023-09-23 DIAGNOSIS — I2510 Atherosclerotic heart disease of native coronary artery without angina pectoris: Secondary | ICD-10-CM | POA: Insufficient documentation

## 2023-09-23 DIAGNOSIS — F17210 Nicotine dependence, cigarettes, uncomplicated: Secondary | ICD-10-CM | POA: Diagnosis not present

## 2023-09-23 DIAGNOSIS — E782 Mixed hyperlipidemia: Secondary | ICD-10-CM | POA: Diagnosis present

## 2023-09-23 DIAGNOSIS — I5022 Chronic systolic (congestive) heart failure: Secondary | ICD-10-CM | POA: Insufficient documentation

## 2023-09-23 DIAGNOSIS — I10 Essential (primary) hypertension: Secondary | ICD-10-CM | POA: Diagnosis present

## 2023-09-23 DIAGNOSIS — F17200 Nicotine dependence, unspecified, uncomplicated: Secondary | ICD-10-CM | POA: Diagnosis not present

## 2023-09-23 DIAGNOSIS — I480 Paroxysmal atrial fibrillation: Secondary | ICD-10-CM

## 2023-09-23 DIAGNOSIS — I4811 Longstanding persistent atrial fibrillation: Secondary | ICD-10-CM | POA: Diagnosis not present

## 2023-09-23 DIAGNOSIS — Z7901 Long term (current) use of anticoagulants: Secondary | ICD-10-CM | POA: Diagnosis not present

## 2023-09-23 DIAGNOSIS — I739 Peripheral vascular disease, unspecified: Secondary | ICD-10-CM | POA: Insufficient documentation

## 2023-09-23 DIAGNOSIS — I252 Old myocardial infarction: Secondary | ICD-10-CM | POA: Insufficient documentation

## 2023-09-23 DIAGNOSIS — I110 Hypertensive heart disease with heart failure: Secondary | ICD-10-CM | POA: Insufficient documentation

## 2023-09-23 DIAGNOSIS — I255 Ischemic cardiomyopathy: Secondary | ICD-10-CM | POA: Diagnosis not present

## 2023-09-23 DIAGNOSIS — Z955 Presence of coronary angioplasty implant and graft: Secondary | ICD-10-CM | POA: Insufficient documentation

## 2023-09-23 LAB — BASIC METABOLIC PANEL
Anion gap: 10 (ref 5–15)
BUN: 25 mg/dL — ABNORMAL HIGH (ref 6–20)
CO2: 27 mmol/L (ref 22–32)
Calcium: 10.1 mg/dL (ref 8.9–10.3)
Chloride: 102 mmol/L (ref 98–111)
Creatinine, Ser: 0.89 mg/dL (ref 0.61–1.24)
GFR, Estimated: >60 mL/min (ref >60)
Glucose, Bld: 133 mg/dL — ABNORMAL HIGH (ref 70–99)
Potassium: 4 mmol/L (ref 3.5–5.1)
Sodium: 139 mmol/L (ref 135–145)

## 2023-09-23 LAB — CBC
HCT: 52.9 % — ABNORMAL HIGH (ref 39.0–52.0)
Hemoglobin: 18.2 g/dL — ABNORMAL HIGH (ref 13.0–17.0)
MCH: 33.5 pg (ref 26.0–34.0)
MCHC: 34.4 g/dL (ref 30.0–36.0)
MCV: 97.2 fL (ref 80.0–100.0)
Platelets: 173 10*3/uL (ref 150–400)
RBC: 5.44 MIL/uL (ref 4.22–5.81)
RDW: 12.3 % (ref 11.5–15.5)
WBC: 9.6 10*3/uL (ref 4.0–10.5)
nRBC: 0 % (ref 0.0–0.2)

## 2023-09-23 MED ORDER — METOPROLOL SUCCINATE ER 100 MG PO TB24: 200.0000 mg | ORAL_TABLET | Freq: Every day | ORAL | 3 refills | Status: DC

## 2023-09-23 MED ORDER — SPIRONOLACTONE 25 MG PO TABS: 25.0000 mg | ORAL_TABLET | Freq: Every day | ORAL | 3 refills | Status: DC

## 2023-09-23 MED ORDER — SACUBITRIL-VALSARTAN 24-26 MG PO TABS: 1.0000 | ORAL_TABLET | Freq: Two times a day (BID) | ORAL | 3 refills | Status: DC

## 2023-09-23 NOTE — Progress Notes (Signed)
 Complex Care Management Care Guide Note  09/23/2023 Name: Ian [name] MRN: [id number] DOB: 1970-12-20  Ian [name] is a 53 y.o. year old male who is a primary care patient of Park Meo, FNP and is actively engaged with the care management team. I reached out to [name] by phone today to assist with re-scheduling  with the Pharmacist.  Follow up plan: Unsuccessful telephone outreach attempt made. A HIPAA compliant phone message was left for the patient providing contact information and requesting a return call.  Penne Lash , RMA     [facility]  [facility] Guide  Direct Dial: [phone]  Website: [email or web address]

## 2023-09-23 NOTE — Patient Instructions (Addendum)
Medication Changes:  STOP LISINOPRIL   ON THURSDAY THIS WEEK 2/13 START ENTRESTO 24/26MG  TWICE DAILY   START: SPIRONOLACTONE 25MG  ONCE DAILY   START: METOPROLOL SUCCINATE 200MG  ONCE DAILY   STOP TAKING METOPROLOL TARTRATE   Lab Work:  Labs done today, your results will be available in MyChart, we will contact you for abnormal readings.  Special Instructions // Education:  Dear Ian [name]  You are scheduled for a Cardioversion on Monday, March 3 with Dr. Elwyn Lade .  Please arrive at the [facility] (Main Entrance A) at [facility]: [address] [place], [place] [address] at 10:00 AM (This time is 1 hour(s) before your procedure to ensure your preparation).   Free valet parking service is available. You will check in at ADMITTING.   *Please Note: You will receive a call the day before your procedure to confirm the appointment time. That time may have changed from the original time based on the schedule for that day.*   DIET:  Nothing to eat or drink after midnight except a sip of water with medications (see medication instructions below)  MEDICATION INSTRUCTIONS: !!IF ANY NEW MEDICATIONS ARE STARTED AFTER TODAY, PLEASE NOTIFY YOUR PROVIDER AS SOON AS POSSIBLE!!  FYI: Medications such as Semaglutide (Ozempic, [place]), Tirzepatide (Mounjaro, Zepbound), Dulaglutide (Trulicity), etc ("GLP1 agonists") AND Canagliflozin (Invokana), Dapagliflozin (Farxiga), Empagliflozin (Jardiance), Ertugliflozin (Steglatro), Bexagliflozin [employer]) or any combination with one of these drugs such as Invokamet (Canagliflozin/Metformin), Synjardy (Empagliflozin/Metformin), etc ("SGLT2 inhibitors") must be held around the time of a procedure. This is not a comprehensive list of all of these drugs. Please review all of your medications and talk to your provider if you take any one of these. If you are not sure, ask your provider.   HOLD: Dapagliflozin Marcelline Deist) for 3 days prior to the procedure.  Last dose on Thursday, February 27.   DO NOT TAKE FARXIGA THE MORNING OF PROCEDURE   DO NOT TAKE GLIPIZIDE THE MORNING OF PROCEDURE   DO NOT TAKE METFORMIN THE MORNING OF PROCEDURE  DO NOT TAKE LASIX THE MORNING OF PROCEDURE   Continue taking your anticoagulant (blood thinner): Apixaban (Eliquis).  You will need to continue this after your procedure until you are told by your provider that it is safe to stop.    LABS: TODAY    FYI:  For your safety, and to allow [place] to monitor your vital signs accurately during the surgery/procedure we request: If you have artificial nails, gel coating, SNS etc, please have those removed prior to your surgery/procedure. Not having the nail coverings /polish removed may result in cancellation or delay of your surgery/procedure.  Your support person will be asked to wait in the waiting room during your procedure.  It is OK to have someone drop you off and come back when you are ready to be discharged.  You cannot drive after the procedure and will need someone to drive you home.  Bring your insurance cards.  *Special Note: Every effort is made to have your procedure done on time. Occasionally there are emergencies that occur at the hospital that may cause delays. Please be patient if a delay does occur.   Follow-Up in: 2 MONTHS PLEASE CALL OUR OFFICE AROUND MARCH  TO GET SCHEDULED FOR YOUR APPOINTMENT. PHONE NUMBER IS [phone] OPTION 2 R  At the [facility], you and your health needs are our priority. We have a designated team specialized in the treatment of Heart Failure. This Care Team includes  your primary Heart Failure Specialized Cardiologist (physician), Advanced Practice Providers (APPs- Physician Assistants and Nurse Practitioners), and Pharmacist who all work together to provide you with the care you need, when you need it.   You may see any of the following providers on your designated Care Team at your next follow up:  Dr.  Arvilla Meres Dr. Marca Ancona Dr. Dorthula Nettles Dr. Theresia Bough Tonye Becket, NP Robbie Lis, [place] [facility], [place] Brynda Peon, NP [place] Lee, NP Karle Plumber, PharmD   Please be sure to bring in all your medications bottles to every appointment.   Need to Contact us:  If you have any questions or concerns before your next appointment please send [place] a message through [place] or call our office at [phone].    TO LEAVE A MESSAGE FOR THE NURSE SELECT OPTION 2, PLEASE LEAVE A MESSAGE INCLUDING: YOUR NAME DATE OF BIRTH CALL BACK NUMBER REASON FOR CALL**this is important as we prioritize the call backs  YOU WILL RECEIVE A CALL BACK THE SAME DAY AS LONG AS YOU CALL BEFORE 4:00 PM

## 2023-09-23 NOTE — Telephone Encounter (Signed)
Called pt, made him aware of Dr. Hazle Coca statement. Pt is not happy. No further questions at this time.

## 2023-09-23 NOTE — Telephone Encounter (Signed)
Advanced Heart Failure Patient Advocate Encounter  Test billing for Sherryll Burger shows that this plan limits a 30 day supply, and has a $40 copay. This patient is eligible to use a copay savings card that would bring the cost down to $10 monthly.  Burnell Blanks, CPhT Rx Patient Advocate Phone: [phone]

## 2023-09-23 NOTE — Progress Notes (Signed)
Orders entered for cardioversion

## 2023-09-24 ENCOUNTER — Telehealth: Payer: Self-pay | Admitting: Cardiovascular Disease

## 2023-09-24 ENCOUNTER — Other Ambulatory Visit: Payer: Self-pay | Admitting: Pharmacist

## 2023-09-24 ENCOUNTER — Other Ambulatory Visit (HOSPITAL_COMMUNITY): Payer: Self-pay

## 2023-09-24 MED ORDER — APIXABAN 5 MG PO TABS: 5.0000 mg | ORAL_TABLET | Freq: Two times a day (BID) | ORAL | 1 refills | Status: DC

## 2023-09-24 MED ORDER — DAPAGLIFLOZIN PROPANEDIOL 5 MG PO TABS: 5.0000 mg | ORAL_TABLET | Freq: Every day | ORAL | 11 refills | Status: AC

## 2023-09-24 NOTE — Telephone Encounter (Signed)
This is a CHF pt

## 2023-09-24 NOTE — H&P (View-Only) (Signed)
 ADVANCED HEART FAILURE NEW PATIENT CLINIC NOTE  Referring Physician: Park Meo, FNP  Primary Care: Park Meo, FNP Primary Cardiologist:  HPI: Ian [name] is a 53 y.o. male with a PMH of PAD, CAD s/p RCA disease, long standing persistent atrial fibrillation, HFmrEF who presents for initial visit for further evaluation and treatment of heart failure/cardiomyopathy.      Per chart review, patient previously had NSTEMI in 2016.  Underwent successful PCI of the mid RCA with drug-eluting stent.  Echocardiogram showed EF 45-50%.  He was noted to have A-fib at that time and was started on Eliquis. Underwent repeat PCI of the RCA for ISR and distal calcified disease.   He moved to [facility] for three years and was lost to follow up. It appears that he has been trial on tikosyn for his atrial fibrillation without success, held off on amiodarone given suspected pulmonary disease.   Returns for referral after echocardiogram showed reduced EF of 35-40%.      SUBJECTIVE: Patient presents today and reports that overall he feels fairly well.  He does notice some more lower extremity swelling which is occurred since he has been less active and out of work.  He does report some shortness of breath with exertion as well.  He does have sleep apnea but did not tolerate wearing the mask well.  He does not feel palpitations from his atrial fibrillation or chest pain.  He has been compliant with his medications.  Denies any hypotensive episodes.  PMH, current medications, allergies, social history, and family history reviewed in epic.  PHYSICAL EXAM: Vitals:   09/23/23 1105  BP: 138/80  Pulse: (!) 101  SpO2: 96%   GENERAL: Well nourished and in no apparent distress at rest.  PULM:  Normal work of breathing, clear to auscultation bilaterally. Respirations are unlabored.  CARDIAC:  JVP: Mildly elevated         Tachycardic, irregular rhythm, no murmurs, gallops, rubs, 1+ lower extremity  edema ABDOMEN: Soft, non-tender, non-distended. NEUROLOGIC: Patient is oriented x3 with no focal or lateralizing neurologic deficits.    DATA REVIEW  ECG: 09/2023: Afib with RVR    ECHO: 02/2020 showed EF 50-55%, mildly reduced RV function, mild MR   07/2023: Moderately reduced EF 35-40%, normal RV function no significant valvular disease  CATH: 10/16/2018: previously placed proximal-mid RCA stents were widely patent with only mild restenosis, 50% stenosis in mid circumflex that was not significant, and 25% stenosis in proximal-mid LAD    PET stress 2024: No evidence of ischemia   Heart failure review: - Classification: Heart failure with reduced EF - Etiology: Work up ongoing - NYHA Class: II - Volume status: Hypervolemic - ACEi/ARB/ARNI: Currently up-titrating - Aldosterone antagonist: Maximally tolerated dose - Beta-blocker: Maximally tolerated dose - Digoxin: Plan to start at a subsequent visit - Hydralazine/Nitrates: Not indicated - SGLT2i: Currently up-titrating - GLP-1: Not a candidate - Advanced therapies: Not needed at this time - ICD: Currently uptitrating GDMT   ASSESSMENT & PLAN:  Chronic systolic heart failure: Patient with new worsening in ejection fraction in the setting of ongoing atrial fibrillation with borderline rates.  His left atrium size is normal and does not appear that he has ever undergone cardioversion.  Will address as below.  In the meantime, we will optimize medical therapy and attempt to augment diuresis. -Stop lisinopril -Start Entresto 24/26 mg twice daily after washout. -Start spironolactone 25 mg daily -Consolidate metoprolol to 100 mg daily, may have to decrease  after cardioversion -Increase Farxiga to 10 mg at next visit -Continue Lasix 40 mg daily, will assess volume status after addition of MRA and ARNI  Atrial fibrillation: Longstanding persistent, though apparently has not been cardioverted.  Will augment diuresis and plan for DCCV  in 3 to 4 weeks.  Will likely start on amiodarone at that time. - Continue apixaban, no recent missed doses - DCCV  CAD: Prior RCA disease and left heart cath in 2020 with stable ischemic disease.  Repeat PET in 2024 without evidence of new disease. -Continue Plavix, apixaban, Crestor 20 mg daily  PAD: Follows with Dr. Allyson Sabal. - Continue disease prevention as above - Smoking cessation discussed, patient is trying to cut back  HLD: Last LDL 68. - Consider addition of zetia to target <55 given PAD/CAD  Follow up in 2 months  Clearnce Hasten, MD Advanced Heart Failure Mechanical Circulatory Support 09/24/23

## 2023-09-24 NOTE — Telephone Encounter (Signed)
*  STAT* If patient is at the pharmacy, call can be transferred to refill team.   1. Which medications need to be refilled? (please list name of each medication and dose if known)   apixaban (ELIQUIS) 5 MG TABS tablet  (completely out) dapagliflozin propanediol (FARXIGA) 5 MG TABS tablet  (1 tablet left)  2. Would you like to learn more about the convenience, safety, & potential cost savings by using the [facility]?   3. Are you open to using the [facility] (Type [facility]. ).   4. Which pharmacy/location (including street and city if local pharmacy) is medication to be sent to?  [id number] - [place], [place] - [address]   5. Do they need a 30 day or 90 day supply?   90 day  Patient stated his refills were sent to [facility] pharmacy in error. Patient noted he out/almost out of these medications.

## 2023-09-24 NOTE — Progress Notes (Signed)
ADVANCED HEART FAILURE NEW PATIENT CLINIC NOTE  Referring Physician: Park Meo, FNP  Primary Care: Park Meo, FNP Primary Cardiologist:  HPI: Ian [name] is a 53 y.o. male with a PMH of PAD, CAD s/p RCA disease, long standing persistent atrial fibrillation, HFmrEF who presents for initial visit for further evaluation and treatment of heart failure/cardiomyopathy.      Per chart review, patient previously had NSTEMI in 2016.  Underwent successful PCI of the mid RCA with drug-eluting stent.  Echocardiogram showed EF 45-50%.  He was noted to have A-fib at that time and was started on Eliquis. Underwent repeat PCI of the RCA for ISR and distal calcified disease.   He moved to [facility] for three years and was lost to follow up. It appears that he has been trial on tikosyn for his atrial fibrillation without success, held off on amiodarone given suspected pulmonary disease.   Returns for referral after echocardiogram showed reduced EF of 35-40%.      SUBJECTIVE: Patient presents today and reports that overall he feels fairly well.  He does notice some more lower extremity swelling which is occurred since he has been less active and out of work.  He does report some shortness of breath with exertion as well.  He does have sleep apnea but did not tolerate wearing the mask well.  He does not feel palpitations from his atrial fibrillation or chest pain.  He has been compliant with his medications.  Denies any hypotensive episodes.  PMH, current medications, allergies, social history, and family history reviewed in epic.  PHYSICAL EXAM: Vitals:   09/23/23 1105  BP: 138/80  Pulse: (!) 101  SpO2: 96%   GENERAL: Well nourished and in no apparent distress at rest.  PULM:  Normal work of breathing, clear to auscultation bilaterally. Respirations are unlabored.  CARDIAC:  JVP: Mildly elevated         Tachycardic, irregular rhythm, no murmurs, gallops, rubs, 1+ lower extremity  edema ABDOMEN: Soft, non-tender, non-distended. NEUROLOGIC: Patient is oriented x3 with no focal or lateralizing neurologic deficits.    DATA REVIEW  ECG: 09/2023: Afib with RVR    ECHO: 02/2020 showed EF 50-55%, mildly reduced RV function, mild MR   07/2023: Moderately reduced EF 35-40%, normal RV function no significant valvular disease  CATH: 10/16/2018: previously placed proximal-mid RCA stents were widely patent with only mild restenosis, 50% stenosis in mid circumflex that was not significant, and 25% stenosis in proximal-mid LAD    PET stress 2024: No evidence of ischemia   Heart failure review: - Classification: Heart failure with reduced EF - Etiology: Work up ongoing - NYHA Class: II - Volume status: Hypervolemic - ACEi/ARB/ARNI: Currently up-titrating - Aldosterone antagonist: Maximally tolerated dose - Beta-blocker: Maximally tolerated dose - Digoxin: Plan to start at a subsequent visit - Hydralazine/Nitrates: Not indicated - SGLT2i: Currently up-titrating - GLP-1: Not a candidate - Advanced therapies: Not needed at this time - ICD: Currently uptitrating GDMT   ASSESSMENT & PLAN:  Chronic systolic heart failure: Patient with new worsening in ejection fraction in the setting of ongoing atrial fibrillation with borderline rates.  His left atrium size is normal and does not appear that he has ever undergone cardioversion.  Will address as below.  In the meantime, we will optimize medical therapy and attempt to augment diuresis. -Stop lisinopril -Start Entresto 24/26 mg twice daily after washout. -Start spironolactone 25 mg daily -Consolidate metoprolol to 100 mg daily, may have to decrease  after cardioversion -Increase Farxiga to 10 mg at next visit -Continue Lasix 40 mg daily, will assess volume status after addition of MRA and ARNI  Atrial fibrillation: Longstanding persistent, though apparently has not been cardioverted.  Will augment diuresis and plan for DCCV  in 3 to 4 weeks.  Will likely start on amiodarone at that time. - Continue apixaban, no recent missed doses - DCCV  CAD: Prior RCA disease and left heart cath in 2020 with stable ischemic disease.  Repeat PET in 2024 without evidence of new disease. -Continue Plavix, apixaban, Crestor 20 mg daily  PAD: Follows with Dr. Allyson Sabal. - Continue disease prevention as above - Smoking cessation discussed, patient is trying to cut back  HLD: Last LDL 68. - Consider addition of zetia to target <55 given PAD/CAD  Follow up in 2 months  Clearnce Hasten, MD Advanced Heart Failure Mechanical Circulatory Support 09/24/23

## 2023-10-10 NOTE — Progress Notes (Signed)
 Pt called for pre procedure instructions.  Left instructions on identified answering machine. Arrival time 0630 NPO after midnight explained Instructed to take am meds with sip of water and confirmed blood thinner consistency Instructed pt need for ride home tomorrow and have responsible adult with them for 24 hrs post procedure.

## 2023-10-12 ENCOUNTER — Encounter (HOSPITAL_COMMUNITY): Payer: Self-pay | Admitting: Cardiology

## 2023-10-12 NOTE — Anesthesia Preprocedure Evaluation (Addendum)
 Anesthesia Evaluation  Patient identified by MRN, date of birth, ID band Patient awake    Reviewed: Allergy & Precautions, NPO status , Patient's Chart, lab work & pertinent test results, reviewed documented beta blocker date and time   History of Anesthesia Complications (+) PROLONGED EMERGENCE and history of anesthetic complications  Airway Mallampati: III  TM Distance: >3 FB     Dental  (+) Edentulous Upper, Edentulous Lower   Pulmonary COPD,  COPD inhaler, Current Smoker and Patient abstained from smoking.   breath sounds clear to auscultation + decreased breath sounds      Cardiovascular hypertension, Pt. on medications and Pt. on home beta blockers + CAD, + Past MI, + Cardiac Stents and + Peripheral Vascular Disease  + dysrhythmias Atrial Fibrillation  Rhythm:Irregular Rate:Normal  NSTEMI 05/2015 Multiple PCI RCA  Cardiac Cath 10/16/18  Previously placed Prox RCA to Mid RCA drug eluting stents are widely patent, with only mild restenosis.  Mid Cx lesion is 50% stenosed. Not significant by DFR.  Prox LAD to Mid LAD lesion is 25% stenosed.  LV end diastolic pressure is normal.  There is no aortic valve stenosis.     Echo 07/18/23  1. Left ventricular ejection fraction, by estimation, is 35 to 40%. The  left ventricle has moderately decreased function. The left ventricle  demonstrates global hypokinesis. There is mild left ventricular  hypertrophy. Left ventricular diastolic  parameters are indeterminate.   2. Right ventricular systolic function is normal. The right ventricular  size is mildly enlarged. Tricuspid regurgitation signal is inadequate for  assessing PA pressure.   3. The mitral valve is normal in structure. Trivial mitral valve  regurgitation. No evidence of mitral stenosis.   4. The aortic valve was not well visualized. Aortic valve regurgitation  is not visualized. No aortic stenosis is present.   5.  The inferior vena cava is normal in size with greater than 50%  respiratory variability, suggesting right atrial pressure of 3 mmHg.   EKG 09/23/23 A. Fib with RVR, RAD   Neuro/Psych negative neurological ROS  negative psych ROS   GI/Hepatic negative GI ROS, Neg liver ROS,,,  Endo/Other  diabetes, Well Controlled, Type 2, Oral Hypoglycemic Agents  HLD Obesity  Renal/GU negative Renal ROS  negative genitourinary   Musculoskeletal  (+) Arthritis , Osteoarthritis,    Abdominal  (+) + obese  Peds  Hematology Eliquis therapy- last dose this am   Anesthesia Other Findings   Reproductive/Obstetrics                              Anesthesia Physical Anesthesia Plan  ASA: 3  Anesthesia Plan: General   Post-op Pain Management:    Induction: Intravenous  PONV Risk Score and Plan: 1 and Treatment may vary due to age or medical condition and Propofol infusion  Airway Management Planned: Natural Airway and Mask  Additional Equipment: None  Intra-op Plan:   Post-operative Plan:   Informed Consent: I have reviewed the patients History and Physical, chart, labs and discussed the procedure including the risks, benefits and alternatives for the proposed anesthesia with the patient or authorized representative who has indicated his/her understanding and acceptance.       Plan Discussed with: CRNA and Anesthesiologist  Anesthesia Plan Comments:         Anesthesia Quick Evaluation

## 2023-10-13 ENCOUNTER — Ambulatory Visit (HOSPITAL_BASED_OUTPATIENT_CLINIC_OR_DEPARTMENT_OTHER): Payer: Self-pay | Admitting: Anesthesiology

## 2023-10-13 ENCOUNTER — Ambulatory Visit (HOSPITAL_COMMUNITY)
Admission: RE | Admit: 2023-10-13 | Discharge: 2023-10-13 | Disposition: A | Discharge status: Home / Self Care | Payer: No Typology Code available for payment source | Attending: Cardiology | Admitting: Cardiology

## 2023-10-13 ENCOUNTER — Encounter (HOSPITAL_COMMUNITY): Payer: Self-pay | Admitting: Cardiology

## 2023-10-13 ENCOUNTER — Encounter (HOSPITAL_COMMUNITY)
Admission: RE | Disposition: A | Discharge status: Home / Self Care | Payer: Self-pay | Source: Home / Self Care | Attending: Cardiology

## 2023-10-13 ENCOUNTER — Ambulatory Visit (HOSPITAL_COMMUNITY): Payer: Self-pay | Admitting: Anesthesiology

## 2023-10-13 ENCOUNTER — Other Ambulatory Visit: Payer: Self-pay

## 2023-10-13 DIAGNOSIS — I10 Essential (primary) hypertension: Secondary | ICD-10-CM

## 2023-10-13 DIAGNOSIS — Z955 Presence of coronary angioplasty implant and graft: Secondary | ICD-10-CM | POA: Diagnosis not present

## 2023-10-13 DIAGNOSIS — G4730 Sleep apnea, unspecified: Secondary | ICD-10-CM | POA: Diagnosis not present

## 2023-10-13 DIAGNOSIS — Z79899 Other long term (current) drug therapy: Secondary | ICD-10-CM | POA: Diagnosis not present

## 2023-10-13 DIAGNOSIS — Z7902 Long term (current) use of antithrombotics/antiplatelets: Secondary | ICD-10-CM | POA: Insufficient documentation

## 2023-10-13 DIAGNOSIS — I4891 Unspecified atrial fibrillation: Secondary | ICD-10-CM

## 2023-10-13 DIAGNOSIS — I429 Cardiomyopathy, unspecified: Secondary | ICD-10-CM | POA: Diagnosis not present

## 2023-10-13 DIAGNOSIS — Z7984 Long term (current) use of oral hypoglycemic drugs: Secondary | ICD-10-CM | POA: Insufficient documentation

## 2023-10-13 DIAGNOSIS — I5022 Chronic systolic (congestive) heart failure: Secondary | ICD-10-CM | POA: Insufficient documentation

## 2023-10-13 DIAGNOSIS — J449 Chronic obstructive pulmonary disease, unspecified: Secondary | ICD-10-CM | POA: Diagnosis not present

## 2023-10-13 DIAGNOSIS — Z7901 Long term (current) use of anticoagulants: Secondary | ICD-10-CM | POA: Diagnosis not present

## 2023-10-13 DIAGNOSIS — I739 Peripheral vascular disease, unspecified: Secondary | ICD-10-CM | POA: Diagnosis not present

## 2023-10-13 DIAGNOSIS — E119 Type 2 diabetes mellitus without complications: Secondary | ICD-10-CM | POA: Insufficient documentation

## 2023-10-13 DIAGNOSIS — E785 Hyperlipidemia, unspecified: Secondary | ICD-10-CM | POA: Insufficient documentation

## 2023-10-13 DIAGNOSIS — I252 Old myocardial infarction: Secondary | ICD-10-CM | POA: Diagnosis not present

## 2023-10-13 DIAGNOSIS — I110 Hypertensive heart disease with heart failure: Secondary | ICD-10-CM | POA: Diagnosis not present

## 2023-10-13 DIAGNOSIS — I4811 Longstanding persistent atrial fibrillation: Secondary | ICD-10-CM | POA: Diagnosis not present

## 2023-10-13 DIAGNOSIS — M1990 Unspecified osteoarthritis, unspecified site: Secondary | ICD-10-CM | POA: Diagnosis not present

## 2023-10-13 DIAGNOSIS — I480 Paroxysmal atrial fibrillation: Secondary | ICD-10-CM

## 2023-10-13 DIAGNOSIS — I2510 Atherosclerotic heart disease of native coronary artery without angina pectoris: Secondary | ICD-10-CM | POA: Insufficient documentation

## 2023-10-13 DIAGNOSIS — F17210 Nicotine dependence, cigarettes, uncomplicated: Secondary | ICD-10-CM | POA: Diagnosis not present

## 2023-10-13 HISTORY — PX: CARDIOVERSION: EP1203

## 2023-10-13 LAB — GLUCOSE, CAPILLARY: Glucose-Capillary: 121 mg/dL — ABNORMAL HIGH (ref 70–99)

## 2023-10-13 SURGERY — CARDIOVERSION (CATH LAB)
Anesthesia: General

## 2023-10-13 MED ORDER — METOPROLOL SUCCINATE ER 100 MG PO TB24: 50.0000 mg | ORAL_TABLET | Freq: Every day | ORAL | 3 refills | Status: DC

## 2023-10-13 MED ORDER — PROPOFOL 10 MG/ML IV BOLUS
INTRAVENOUS | Status: DC | PRN
  Administered 2023-10-13: 50 mg via INTRAVENOUS

## 2023-10-13 MED ORDER — SODIUM CHLORIDE 0.9 % IV SOLN
INTRAVENOUS | Status: DC
Start: 2023-10-13 — End: 2023-10-13

## 2023-10-13 MED ORDER — LIDOCAINE 2% (20 MG/ML) 5 ML SYRINGE
INTRAMUSCULAR | Status: DC | PRN
  Administered 2023-10-13: 50 mg via INTRAVENOUS

## 2023-10-13 SURGICAL SUPPLY — 1 items: PAD DEFIB RADIO PHYSIO CONN (PAD) ×1 IMPLANT

## 2023-10-13 NOTE — Anesthesia Postprocedure Evaluation (Signed)
 Anesthesia Post Note  Patient: Ian [name]  Procedure(s) Performed: CARDIOVERSION     Patient location during evaluation: PACU Anesthesia Type: General Level of consciousness: awake and alert and oriented Pain management: pain level controlled Vital Signs Assessment: post-procedure vital signs reviewed and stable Respiratory status: spontaneous breathing, nonlabored ventilation and respiratory function stable Cardiovascular status: blood pressure returned to baseline and stable Postop Assessment: no apparent nausea or vomiting Anesthetic complications: no   No notable events documented.  Last Vitals:  Vitals:   10/13/23 0750 10/13/23 0800  BP: 98/64 100/65  Pulse: (!) 46 (!) 46  Resp: 13 14  Temp:    SpO2: 96% 98%    Last Pain:  Vitals:   10/13/23 0800  TempSrc:   PainSc: 0-No pain                 Laelyn Blumenthal A.

## 2023-10-13 NOTE — Interval H&P Note (Signed)
 History and Physical Interval Note:  10/13/2023 7:13 AM  Ian [name]  has presented today for surgery, with the diagnosis of AFIB.  The various methods of treatment have been discussed with the patient and family. After consideration of risks, benefits and other options for treatment, the patient has consented to  Procedure(s): CARDIOVERSION (N/A) as a surgical intervention.  The patient's history has been reviewed, patient examined, no change in status, stable for surgery.  I have reviewed the patient's chart and labs.  Questions were answered to the patient's satisfaction.     Romie Minus

## 2023-10-13 NOTE — CV Procedure (Signed)
   DIRECT CURRENT CARDIOVERSION  NAME:  Ian [name]    MRN: [id number] DOB:  August 08, 1971    ADMIT DATE: 10/13/2023  CARDIOVERSION:     Indications:  Symptomatic Atrial Fibrillation  Informed consent was obtained prior to the procedure. The risks, benefits and alternatives for the procedure were discussed and the patient comprehended these risks.  Risks include, but are not limited to treatment failure, burns to the chest, pain/discomfort, ventricular arrhythmia. Patient confirmed missing no doses of OAC in the last 3 weeks.  After a procedural time-out, sedation was performed by anesthesia. The patient had the defibrillator pads placed in the anterior and posterior position. Once an appropriate level of sedation was confirmed, the patient was cardioverted successfully with 200J of biphasic synchronized energy.  The patient converted to NSR.  There were no apparent complications.  The patient had normal neuro status and respiratory status post procedure with vitals stable as recorded elsewhere.  Adequate airway was maintained throughout and vital signs monitored per protocol.  COMPLICATIONS:    Complications: No complications Patient tolerated procedure well. Reduce metoprolol to 50mg  daily, hold today  Clearnce Hasten Advanced Heart Failure 7:27 AM

## 2023-10-13 NOTE — Transfer of Care (Signed)
 Immediate Anesthesia Transfer of Care Note  Patient: Ian [name]  Procedure(s) Performed: CARDIOVERSION  Patient Location: PACU  Anesthesia Type:General  Level of Consciousness: awake, alert , and oriented  Airway & Oxygen Therapy: Patient Spontanous Breathing and Patient connected to face mask oxygen  Post-op Assessment: Report given to RN, Post -op Vital signs reviewed and stable, and Patient moving all extremities X 4  Post vital signs: Reviewed and stable  Last Vitals:  Vitals Value Taken Time  BP 138/76 10/13/23 0715  Temp    Pulse 74 10/13/23 0718  Resp 21 10/13/23 0718  SpO2 99 % 10/13/23 0718  Vitals shown include unfiled device data.  Last Pain:  Vitals:   10/13/23 0711  TempSrc: Temporal  PainSc: 0-No pain         Complications: No notable events documented.

## 2023-10-20 ENCOUNTER — Other Ambulatory Visit: Payer: Self-pay | Admitting: Family Medicine

## 2023-10-21 NOTE — Telephone Encounter (Signed)
 Requested Prescriptions  Pending Prescriptions Disp Refills   glipiZIDE (GLUCOTROL) 10 MG tablet [Pharmacy Med Name: glipiZIDE 10 MG Oral Tablet] 180 tablet 0    Sig: TAKE 1 TABLET BY MOUTH TWICE DAILY BEFORE A MEAL     Endocrinology:  Diabetes - Sulfonylureas Failed - 10/21/2023  3:16 PM      Failed - HBA1C is between 0 and 7.9 and within 180 days    Hgb A1c MFr Bld  Date Value Ref Range Status  06/09/2023 8.9 (H) <5.7 % of total Hgb Final    Comment:    For someone without known diabetes, a hemoglobin A1c value of 6.5% or greater indicates that they may have  diabetes and this should be confirmed with a follow-up  test. . For someone with known diabetes, a value <7% indicates  that their diabetes is well controlled and a value  greater than or equal to 7% indicates suboptimal  control. A1c targets should be individualized based on  duration of diabetes, age, comorbid conditions, and  other considerations. . Currently, no consensus exists regarding use of hemoglobin A1c for diagnosis of diabetes for children. .          Failed - Valid encounter within last 6 months    Recent Outpatient Visits   None     Future Appointments             In 1 month Allyson Sabal, [name], MD [facility] - Cr in normal range and within 360 days    Creat  Date Value Ref Range Status  06/09/2023 0.86 0.70 - 1.30 mg/dL Final   Creatinine, Ser  Date Value Ref Range Status  09/23/2023 0.89 0.61 - 1.24 mg/dL Final   Creatinine, Urine  Date Value Ref Range Status  06/09/2023 70 20 - 320 mg/dL Final

## 2023-11-04 NOTE — Progress Notes (Signed)
 Complex Care Management Care Guide Note  11/04/2023 Name: [name] MRN: [id number] DOB: 1971-03-10  Ian [name] is a 53 y.o. year old male who is a primary care patient of Park Meo, FNP and is actively engaged with the care management team. I reached out to [name] by phone today to assist with re-scheduling  with the Pharmacist.  Follow up plan: Unsuccessful telephone outreach attempt made. A HIPAA compliant phone message was left for the patient providing contact information and requesting a return call.  Penne Lash , RMA     [facility]  [facility], [facility] Guide  Direct Dial: [phone]  Website: [email or web address]

## 2023-11-18 ENCOUNTER — Encounter (HOSPITAL_COMMUNITY): Payer: Self-pay | Admitting: Cardiology

## 2023-11-18 ENCOUNTER — Ambulatory Visit (HOSPITAL_COMMUNITY)
Admission: RE | Admit: 2023-11-18 | Discharge: 2023-11-18 | Disposition: A | Discharge status: Home / Self Care | Source: Ambulatory Visit | Attending: Cardiology | Admitting: Cardiology

## 2023-11-18 VITALS — BP 100/60 | HR 80 | Wt 264.4 lb

## 2023-11-18 DIAGNOSIS — Z955 Presence of coronary angioplasty implant and graft: Secondary | ICD-10-CM | POA: Insufficient documentation

## 2023-11-18 DIAGNOSIS — I5022 Chronic systolic (congestive) heart failure: Secondary | ICD-10-CM | POA: Insufficient documentation

## 2023-11-18 DIAGNOSIS — I739 Peripheral vascular disease, unspecified: Secondary | ICD-10-CM | POA: Diagnosis not present

## 2023-11-18 DIAGNOSIS — Z7984 Long term (current) use of oral hypoglycemic drugs: Secondary | ICD-10-CM | POA: Diagnosis not present

## 2023-11-18 DIAGNOSIS — Z7901 Long term (current) use of anticoagulants: Secondary | ICD-10-CM | POA: Diagnosis not present

## 2023-11-18 DIAGNOSIS — Z79899 Other long term (current) drug therapy: Secondary | ICD-10-CM | POA: Diagnosis not present

## 2023-11-18 DIAGNOSIS — I2510 Atherosclerotic heart disease of native coronary artery without angina pectoris: Secondary | ICD-10-CM | POA: Diagnosis not present

## 2023-11-18 DIAGNOSIS — E785 Hyperlipidemia, unspecified: Secondary | ICD-10-CM | POA: Insufficient documentation

## 2023-11-18 DIAGNOSIS — I252 Old myocardial infarction: Secondary | ICD-10-CM | POA: Diagnosis not present

## 2023-11-18 DIAGNOSIS — I4811 Longstanding persistent atrial fibrillation: Secondary | ICD-10-CM | POA: Diagnosis not present

## 2023-11-18 MED ORDER — METOPROLOL SUCCINATE ER 100 MG PO TB24: 100.0000 mg | ORAL_TABLET | Freq: Every day | ORAL | 3 refills | Status: DC

## 2023-11-18 NOTE — Progress Notes (Signed)
 ADVANCED HEART FAILURE FOLLOW UP CLINIC NOTE  Referring Physician: Park Meo, FNP  Primary Care: Park Meo, FNP Primary Cardiologist:  HPI: Ian [name] is a 53 y.o. male with a PMH of  PAD, CAD s/p RCA disease, long standing persistent atrial fibrillation, HFmrEF who presents for follow up of chronic heart failure with minimally reduced EF.      Per chart review, patient previously had NSTEMI in 2016.  Underwent successful PCI of the mid RCA with drug-eluting stent.  Echocardiogram showed EF 45-50%.  He was noted to have A-fib at that time and was started on Eliquis. Underwent repeat PCI of the RCA for ISR and distal calcified disease.    He moved to [facility] for three years and was lost to follow up. It appears that he has been trial on tikosyn for his atrial fibrillation without success, held off on amiodarone given suspected pulmonary disease.    Returns for referral after echocardiogram showed reduced EF of 35-40%.      SUBJECTIVE:  Unfortuantely back in atrial fibrillation after the procedure.  He did report that he was feeling better initially when he was in sinus rhythm, is not clear when he flipped back.  Heart rate is controlled.  He has gained some weight, but he reports that his likely secondary to starting working in a [employer].  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  he otherwise reports doing well.  PMH, current medications, allergies, social history, and family history reviewed in epic.  PHYSICAL EXAM: Vitals:   11/18/23 1454  BP: 100/60  Pulse: 80  SpO2: 96%   GENERAL: Well nourished and in no apparent distress at rest.  PULM:  Normal work of breathing, clear to auscultation bilaterally. Respirations are unlabored.  CARDIAC:  JVP: Flat         Irregular rate and rhythm. No murmurs, rubs or gallops.  Trace lower extremity edema. Warm and well perfused extremities. ABDOMEN: Soft,  non-tender, non-distended. NEUROLOGIC: Patient is oriented x3 with no focal or lateralizing neurologic deficits.    DATA REVIEW  ECG: 09/2023: Afib with RVR     ECHO: 02/2020 showed EF 50-55%, mildly reduced RV function, mild MR    07/2023: Moderately reduced EF 35-40%, normal RV function no significant valvular disease   CATH: 10/16/2018: previously placed proximal-mid RCA stents were widely patent with only mild restenosis, 50% stenosis in mid circumflex that was not significant, and 25% stenosis in proximal-mid LAD     PET stress 2024: No evidence of ischemia  Heart failure review: - Classification: Heart failure with reduced EF - Etiology: Work up ongoing - NYHA Class: II - Volume status: Euvolemic - ACEi/ARB/ARNI: Currently up-titrating - Aldosterone antagonist: Maximally tolerated dose - Beta-blocker: Currently up-titrating - Digoxin: Not indicated - Hydralazine/Nitrates: Not indicated - SGLT2i: Maximally tolerated dose - GLP-1: Consider in future - Advanced therapies: Not needed at this time - ICD: Currently uptitrating GDMT  ASSESSMENT & PLAN:  Chronic systolic heart failure: Patient with worsening in ejection fraction in the setting of ongoing atrial fibrillation with borderline rates.  Cardioversion initially successful but now back in atrial fibrillation.  Given normal left atrial size will arrange for follow-up in EP clinic for consideration of ablation versus drug therapy.  In addition, sleep study ordered.  Given that he has now been trialed on medical therapy will also arrange for repeat echo. -Continue Entresto 24/26 mg twice  daily -Continue spironolactone 25 mg daily -Increase metoprolol to 100mg  daily -Continue farxiga 5mg  daily -Continue Lasix 40 mg daily, will assess volume status after addition of MRA and ARNI - Sleep study ordered given erythrocytosis - Afib clinic referral as well  - Echo with next visit   Atrial fibrillation: Longstanding persistent,  though apparently has not been cardioverted.  Went into sinus rhythm but now back in afib, suspect sleep apnea at least playing a component. Will refer to EP for consideration of ablation. - Continue apixaban, no recent missed doses - EP referral   CAD: Prior RCA disease and left heart cath in 2020 with stable ischemic disease.  Repeat PET in 2024 without evidence of new disease. -Continue Plavix, apixaban, Crestor 20 mg daily   PAD: Follows with Dr. Allyson Sabal. - Continue disease prevention as above - Smoking cessation discussed, patient is trying to cut back   HLD: Last LDL 68. - Consider addition of zetia to target <55 given PAD/CAD  Follow up in 3 months  Clearnce Hasten, MD Advanced Heart Failure Mechanical Circulatory Support 11/18/23

## 2023-11-18 NOTE — Patient Instructions (Addendum)
 INCREASE Toprol Xl to 100 mg daily.  Your physician has requested that you have an echocardiogram. Echocardiography is a painless test that uses sound waves to create images of your heart. It provides your doctor with information about the size and shape of your heart and how well your heart's chambers and valves are working. This procedure takes approximately one hour. There are no restrictions for this procedure. Please do NOT wear cologne, perfume, aftershave, or lotions (deodorant is allowed). Please arrive 15 minutes prior to your appointment time.  Please note: We ask at that you not bring children with you during ultrasound (echo/ vascular) testing. Due to room size and safety concerns, children are not allowed in the ultrasound rooms during exams. Our front office staff cannot provide observation of children in our lobby area while testing is being conducted. An adult accompanying a patient to their appointment will only be allowed in the ultrasound room at the discretion of the ultrasound technician under special circumstances. We apologize for any inconvenience.  You have been referred to the sleep clinic. They will call you to arrange your appointment.  You have been referred to the electrophysiologist. They will call you to arrange your appointment.  Your physician recommends that you schedule a follow-up appointment in: 3 months (July) ** PLEASE CALL THE OFFICE IN MAY TO ARRANGE YOUR FOLLOW UP APPOINTMENT.**  If you have any questions or concerns before your next appointment please send [place] a message through [place] or call our office at [phone].    TO LEAVE A MESSAGE FOR THE NURSE SELECT OPTION 2, PLEASE LEAVE A MESSAGE INCLUDING: YOUR NAME DATE OF BIRTH CALL BACK NUMBER REASON FOR CALL**this is important as we prioritize the call backs  YOU WILL RECEIVE A CALL BACK THE SAME DAY AS LONG AS YOU CALL BEFORE 4:00 PM  At the [facility], you and your health  needs are our priority. As part of our continuing mission to provide you with exceptional heart care, we have created designated Provider Care Teams. These Care Teams include your primary Cardiologist (physician) and Advanced Practice Providers (APPs- Physician Assistants and Nurse Practitioners) who all work together to provide you with the care you need, when you need it.   You may see any of the following providers on your designated Care Team at your next follow up: Dr Arvilla Meres Dr Marca Ancona Dr. Dorthula Nettles Dr. Clearnce Hasten Amy Filbert Schilder, NP Robbie Lis, [place] [facility] [place], [place] Brynda Peon, NP [place] Lee, NP Clarisa Kindred, NP Karle Plumber, PharmD Enos Fling, PharmD   Please be sure to bring in all your medications bottles to every appointment.    Thank you for choosing  [facility]

## 2023-12-01 ENCOUNTER — Ambulatory Visit: Payer: Self-pay | Attending: Cardiovascular Disease | Admitting: Cardiovascular Disease

## 2023-12-02 ENCOUNTER — Encounter: Payer: Self-pay | Admitting: Cardiovascular Disease

## 2023-12-08 ENCOUNTER — Other Ambulatory Visit: Payer: Self-pay | Admitting: Family Medicine

## 2023-12-10 ENCOUNTER — Other Ambulatory Visit (HOSPITAL_COMMUNITY): Payer: Self-pay

## 2023-12-10 ENCOUNTER — Telehealth (HOSPITAL_COMMUNITY): Payer: Self-pay

## 2023-12-10 NOTE — Telephone Encounter (Signed)
 Advanced Heart Failure Patient Advocate Encounter  Prior authorization for Farxiga  has been submitted and approved. Test billing returns $4 for 90 day supply.  Key: [id number] Effective: 12/10/2023 to 12/09/2024  Kennis Peacock, CPhT Rx Patient Advocate Phone: [phone]

## 2023-12-16 ENCOUNTER — Ambulatory Visit (HOSPITAL_COMMUNITY)
Admission: RE | Admit: 2023-12-16 | Discharge: 2023-12-16 | Disposition: A | Discharge status: Home / Self Care | Payer: Self-pay | Source: Ambulatory Visit | Attending: Cardiology | Admitting: Cardiology

## 2023-12-16 DIAGNOSIS — I2510 Atherosclerotic heart disease of native coronary artery without angina pectoris: Secondary | ICD-10-CM | POA: Diagnosis not present

## 2023-12-16 DIAGNOSIS — E119 Type 2 diabetes mellitus without complications: Secondary | ICD-10-CM | POA: Insufficient documentation

## 2023-12-16 DIAGNOSIS — I110 Hypertensive heart disease with heart failure: Secondary | ICD-10-CM | POA: Insufficient documentation

## 2023-12-16 DIAGNOSIS — I3481 Nonrheumatic mitral (valve) annulus calcification: Secondary | ICD-10-CM | POA: Insufficient documentation

## 2023-12-16 DIAGNOSIS — I5022 Chronic systolic (congestive) heart failure: Secondary | ICD-10-CM

## 2023-12-16 DIAGNOSIS — F17200 Nicotine dependence, unspecified, uncomplicated: Secondary | ICD-10-CM | POA: Insufficient documentation

## 2023-12-16 LAB — ECHOCARDIOGRAM COMPLETE
AR max vel: 3.34 cm2
AV Peak grad: 4.6 mmHg
Ao pk vel: 1.07 m/s
Area-P 1/2: 3.4 cm2
Calc EF: 42 %
MV VTI: 2.73 cm2
S' Lateral: 4.1 cm
Single Plane A2C EF: 41.4 %
Single Plane A4C EF: 40 %

## 2023-12-22 ENCOUNTER — Other Ambulatory Visit: Payer: Self-pay

## 2023-12-22 NOTE — Telephone Encounter (Signed)
 Pt came into office to request a refill of this med:  albuterol  (VENTOLIN  HFA) 108 (90 Base) MCG/ACT inhaler [[id number]]  LOV: 08/18/23 UPCOMING CPE 02/17/24  PHARMACY: [facility] [address], [place] - [address], [place] [address] Phone: [phone]  Fax: [phone]  CB#: [phone]

## 2023-12-23 ENCOUNTER — Encounter: Payer: Self-pay | Admitting: Family Medicine

## 2023-12-23 ENCOUNTER — Ambulatory Visit: Admitting: Family Medicine

## 2023-12-23 ENCOUNTER — Ambulatory Visit: Payer: Self-pay

## 2023-12-23 VITALS — BP 120/72 | HR 93 | Ht 70.0 in | Wt 268.0 lb

## 2023-12-23 DIAGNOSIS — M542 Cervicalgia: Secondary | ICD-10-CM

## 2023-12-23 DIAGNOSIS — E118 Type 2 diabetes mellitus with unspecified complications: Secondary | ICD-10-CM | POA: Diagnosis not present

## 2023-12-23 DIAGNOSIS — M25561 Pain in right knee: Secondary | ICD-10-CM | POA: Diagnosis not present

## 2023-12-23 DIAGNOSIS — R202 Paresthesia of skin: Secondary | ICD-10-CM

## 2023-12-23 DIAGNOSIS — R200 Anesthesia of skin: Secondary | ICD-10-CM

## 2023-12-23 DIAGNOSIS — I480 Paroxysmal atrial fibrillation: Secondary | ICD-10-CM

## 2023-12-23 MED ORDER — GABAPENTIN 100 MG PO CAPS: ORAL_CAPSULE | ORAL | 0 refills | Status: DC

## 2023-12-23 NOTE — Telephone Encounter (Signed)
 Chief Complaint: right shoulder pain, right knee pain Symptoms: right shoulder/elbow pain, right index and ring finger numbness; right knee pain and swelling Frequency: x several months, worsening over past 3 weeks Pertinent Negatives: Patient denies neck pain, recent overuse or injury to right shoulder, swelling, rash, fever Disposition: [] ED /[] Urgent Care (no appt availability in office) / [x] Appointment(In office/virtual)/ []  [place] [facility]/ [] Home Care/ [] Refused Recommended Disposition /[] [place] [facility]/ []  Follow-up with PCP Additional Notes: Patient states possibly 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 Patient states he has been using a knee brace and Icy Hot for his symptoms. Patient agreeable to acute visit this afternoon with Dr Ferna How.  Copied from CRM [id number]. Topic: Clinical - Medical Advice >> Dec 23, 2023  9:13 AM Tiffany B wrote: Reason for CRM:  Patient states  shoulder and elbow pain is not improving but worsening. Patient was seen by Dr. Cheril Cork on 08/18/2023 for the arm and would like PCP to place a referral to a specialist unsure if he needs to be seen by a orthopedic surgeon.   Offered appointment and patient declined at this time until he speaks with a Nurse to advise what PCP advised. Reason for Disposition  [1] Very swollen joint AND [2] no fever  Weakness (i.e., loss of strength) in hand or fingers  (Exception: Not truly weak; hand feels weak because of pain.)  Answer Assessment - Initial Assessment Questions 1. ONSET: "When did the pain start?"     Several, about 6 months.  2. LOCATION: "Where is the pain located?"     Right shoulder.  3. PAIN: "How bad is the pain?" (Scale 1-10; or mild, moderate, severe)   - MILD (1-3): doesn't interfere with normal activities   - MODERATE (4-7): interferes with normal activities (e.g., work or school) or awakens from sleep   - SEVERE (8-10): excruciating pain, unable  to do any normal activities, unable to move arm at all due to pain     2/10, Tylenol  taken today.  4. WORK OR EXERCISE: "Has there been any recent work or exercise that involved this part of the body?"     Denies.  5. CAUSE: "What do you think is causing the shoulder pain?"     He states he was told he had tennis elbow and also diagnosed with cervical radiculopathy.  6. OTHER SYMPTOMS: "Do you have any other symptoms?" (e.g., neck pain, swelling, rash, fever, numbness, weakness)     Right index and ring finger numbness, intermittent weakness in right arm, right elbow pain.  7. PREGNANCY: "Is there any chance you are pregnant?" "When was your last menstrual period?"     N/A.  Answer Assessment - Initial Assessment Questions 1. LOCATION and RADIATION: "Where is the pain located?"      Right knee. Medial side of knee cap.  2. QUALITY: "What does the pain feel like?"  (e.g., sharp, dull, aching, burning)     "Constant"  3. SEVERITY: "How bad is the pain?" "What does it keep you from doing?"   (Scale 1-10; or mild, moderate, severe)   -  MILD (1-3): doesn't interfere with normal activities    -  MODERATE (4-7): interferes with normal activities (e.g., work or school) or awakens from sleep, limping    -  SEVERE (8-10): excruciating pain, unable to do any normal activities, unable to walk     7-8/10.  4. ONSET: "When did the pain start?" "Does it come  and go, or is it there all the time?"     2 months ago, worsening over the past 3 weeks. Constant.  5. RECURRENT: "Have you had this pain before?" If Yes, ask: "When, and what happened then?"     "Not like this".  6. SETTING: "Has there been any recent work, exercise or other activity that involved that part of the body?"      No.  7. AGGRAVATING FACTORS: "What makes the knee pain worse?" (e.g., walking, climbing stairs, running)     "Pretty much everything I do", walking, bending  8. ASSOCIATED SYMPTOMS: "Is there any swelling or  redness of the knee?"     Swelling.  9. OTHER SYMPTOMS: "Do you have any other symptoms?" (e.g., chest pain, difficulty breathing, fever, calf pain)     Right knee swelling (about size of softball)  10. PREGNANCY: "Is there any chance you are pregnant?" "When was your last menstrual period?"       N/A.  Protocols used: Shoulder Pain-A-AH, Knee Pain-A-AH

## 2023-12-23 NOTE — Progress Notes (Unsigned)
 Patient Office Visit  Assessment & Plan:  Acute pain of right knee -     DG Knee 1-2 Views Right; Future  Cervicalgia -     Gabapentin; Start with one and increase to 3 at night time  Dispense: 90 capsule; Refill: 0 -     DG Cervical Spine Complete; Future  Numbness and tingling in right hand -     Gabapentin; Start with one and increase to 3 at night time  Dispense: 90 capsule; Refill: 0  PAF (paroxysmal atrial fibrillation) (HCC)  Type 2 diabetes mellitus with complication, without long-term current use of insulin  (HCC)   Assessment and Plan    Right knee pain Chronic right knee pain with exacerbation, swelling, and pain on ambulation. Differential includes arthritis and musculoskeletal issues. - Order x-ray of right knee at [facility]. - Recommend knee brace for support. - Consider Voltaren gel for symptomatic relief.  Numbness in fingers, possible carpal tunnel syndrome Chronic numbness in fingers, differential includes carpal tunnel syndrome and cervical radiculopathy. Previous prednisone  ineffective. Gabapentin discussed for nerve pain and numbness, may cause sedation, non-addictive. - Order x-ray of cervical spine at [facility]. - Prescribe gabapentin for nighttime use.  Diabetes, poorly controlled Poorly controlled diabetes exacerbated by previous prednisone  use.  Heart failure with reduced ejection fraction Heart failure with reduced ejection fraction, previously measured at 35-40%.  Atrial fibrillation Atrial fibrillation.  Peripheral artery disease with stent in left leg Peripheral artery disease with previous stent placement in left leg and current 80% blockage in main artery of left leg. - Follow-up with cardiologist scheduled.     Follow up on x-ray over read and notify patient.  Patient will let us  know if he needs to increase the gabapentin.  If no improvement may need to see orthopedic for further evaluation. No follow-ups on file.   Subjective:     Patient  ID: Ian [name], male    DOB: [birth year]  Age: 53 y.o. MRN: [id number]  Chief Complaint  Patient presents with   Knee Pain    Pt c/o R knee pain x 1 month.    Shoulder Pain    Pt c/o R shoulder pain with numbness and tingling to R hand for last 3 months.     HPI Discussed the use of AI scribe software for clinical note transcription with the patient, who gave verbal consent to proceed.  History of Present Illness   Ian [name] is a 53 year old male with a history of heart attack and poorly controlled diabetes who presents with right knee pain.  He experiences significant pain in his right knee, which is swollen. The pain intensifies with walking and standing, particularly after about an hour. He uses a knee brace and applies icy hot spray, which provides minimal relief. The pain is severe enough to cause limping and significantly impacts his ability to work, as he is currently employed at [employer] where he stands for extended periods. No specific trauma to the knee is noted.  He has a history of shoulder pain, initially treated with prednisone , which did not alleviate his symptoms and caused elevated blood sugar levels. The shoulder pain, initially thought to be due to a pinched nerve, tennis elbow, or carpal tunnel syndrome, had subsided for a while but has recently started to bother him again, particularly in the hand and shoulder. He experiences persistent numbness in his fingers, specifically the pointer and middle fingers, which has not changed over time.  He has  a significant cardiac history, including a previous heart attack and atrial fibrillation. 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 He also has an 80% blockage in the main artery of his left leg, for which he previously had a stent placed when it was 100% blocked. He is scheduled to see his cardiologist soon for further evaluation.  He has reduced  his smoking from two and a half packs to half a pack per day. He lives in a travel camper and notes that using icy hot spray inside sets off his CO alarm.      Physical Exam MUSCULOSKELETAL: Right knee swollen and tender to palpation. Limping due to right knee pain. Results DIAGNOSTIC Ejection fraction: 35-40% Cardiac catheterization: 80% stenosis in the left main coronary artery Assessment & Plan Right knee pain-acute without trauma Chronic right knee pain with exacerbation, swelling, and pain on ambulation. Differential includes arthritis and musculoskeletal issues. Patient has PAD left leg which required stent so not sure if he putting more pressure/weight on right leg.  - Order x-ray of right knee at [facility]. - Recommend knee brace for support. - Consider Voltaren gel for symptomatic relief.  Numbness in fingers, possible carpal tunnel syndrome Chronic numbness in fingers, differential includes carpal tunnel syndrome and cervical radiculopathy. Previous prednisone  ineffective. Gabapentin discussed for nerve pain and numbness, may cause sedation, non-addictive. - Order x-ray of cervical spine at [facility]. - Prescribe gabapentin for nighttime use.  Type 2 Diabetes, poorly controlled- last A1C 8.9 Poorly controlled diabetes exacerbated by previous prednisone  use.  Heart failure with reduced ejection fraction Heart failure with reduced ejection fraction, previously measured at 35-40%.  Paroxymal Atrial fibrillation Atrial fibrillation followed by Cardiology, taking Eliquis   Peripheral artery disease with stent in left leg Peripheral artery disease with previous stent placement in left leg having 75%-80% blockage  - Follow-up with cardiologist Dr. Katheryne Pane as scheduled.    The ASCVD Risk score (Arnett DK, et al., 2019) failed to calculate for the following reasons:   Risk score cannot be calculated because patient has a medical history suggesting prior/existing ASCVD  Past Medical History:   Diagnosis Date   Arthritis    "left ankle" (09/04/2016)   Cardiomyopathy (HCC)    a. EF 40-45% by prior echo. .b. 50-55% in 02/2020.   Complication of anesthesia    "hard to get me out of it sometimes; usually takes me longer to get under"   Coronary artery disease    a. multiple PCIs to RCA.   Essential hypertension 12/19/2015   Hyperlipidemia 05/21/2015   Hypertension    NSTEMI (non-ST elevated myocardial infarction) (HCC) 05/2015   Maximo Spar 05/23/2015 "minor one"   PAD (peripheral artery disease) (HCC)    PAF (paroxysmal atrial fibrillation) (HCC)    Tobacco abuse 05/21/2015   Type II diabetes mellitus (HCC)    Past Surgical History:  Procedure Laterality Date   ABDOMINAL AORTOGRAM W/LOWER EXTREMITY Bilateral 03/27/2020   Procedure: ABDOMINAL AORTOGRAM W/LOWER EXTREMITY;  Surgeon: Avanell Leigh, MD;  Location: [facility] INVASIVE CV LAB;  Service: Cardiovascular;  Laterality: Bilateral;   CARDIAC CATHETERIZATION  2000, 2005   "less than 20% blockage"   CARDIAC CATHETERIZATION N/A 05/22/2015   Procedure: Left Heart Cath and Coronary Angiography;  Surgeon: Lucendia Rusk, MD;  Location: [facility] INVASIVE CV LAB;  Service: Cardiovascular;  Laterality: N/A;   CARDIAC CATHETERIZATION N/A 05/22/2015   Procedure: Coronary Stent Intervention;  Surgeon: Anner Kill  Jacquelynn Matter, MD;  Location: [facility] INVASIVE CV LAB;  Service: Cardiovascular;  Laterality: N/A;   CARDIAC CATHETERIZATION N/A 09/04/2016   Procedure: Left Heart Cath and Coronary Angiography;  Surgeon: Lucendia Rusk, MD;  Location: [facility] INVASIVE CV LAB;  Service: Cardiovascular;  Laterality: N/A;   CARDIAC CATHETERIZATION N/A 09/04/2016   Procedure: Coronary Balloon Angioplasty;  Surgeon: Lucendia Rusk, MD;  Location: [facility] INVASIVE CV LAB;  Service: Cardiovascular;  Laterality: N/A;   CARDIAC CATHETERIZATION N/A 09/05/2016   Procedure: Coronary Atherectomy;  Surgeon: Arleen Lacer, MD;  Location: [facility] INVASIVE CV LAB;  Service:  Cardiovascular;  Laterality: N/A;   CARDIAC CATHETERIZATION N/A 09/05/2016   Procedure: Coronary Stent Intervention;  Surgeon: Arleen Lacer, MD;  Location: [facility] INVASIVE CV LAB;  Service: Cardiovascular;  Laterality: N/A;   CARDIAC CATHETERIZATION N/A 09/05/2016   Procedure: Temporary Pacemaker;  Surgeon: Arleen Lacer, MD;  Location: [facility] INVASIVE CV LAB;  Service: Cardiovascular;  Laterality: N/A;   CARDIOVERSION N/A 10/13/2023   Procedure: CARDIOVERSION;  Surgeon: Lauralee Poll, MD;  Location: [facility] INVASIVE CV LAB;  Service: Cardiovascular;  Laterality: N/A;   CORONARY ANGIOPLASTY     CORONARY PRESSURE/FFR STUDY N/A 10/16/2018   Procedure: INTRAVASCULAR PRESSURE WIRE/FFR STUDY;  Surgeon: Lucendia Rusk, MD;  Location: [facility] INVASIVE CV LAB;  Service: Cardiovascular;  Laterality: N/A;   LEFT HEART CATH AND CORONARY ANGIOGRAPHY N/A 10/16/2018   Procedure: LEFT HEART CATH AND CORONARY ANGIOGRAPHY;  Surgeon: Lucendia Rusk, MD;  Location: [facility] INVASIVE CV LAB;  Service: Cardiovascular;  Laterality: N/A;   MULTIPLE TOOTH EXTRACTIONS  2013   "all of them"   PERIPHERAL VASCULAR INTERVENTION  03/27/2020   Procedure: PERIPHERAL VASCULAR INTERVENTION;  Surgeon: Avanell Leigh, MD;  Location: [facility] INVASIVE CV LAB;  Service: Cardiovascular;;  Left iliac artery    TYMPANOSTOMY TUBE PLACEMENT Bilateral 1970s   Social History   Tobacco Use   Smoking status: Every Day    Current packs/day: 0.50    Average packs/day: 0.5 packs/day for 35.0 years (17.5 ttl pk-yrs)    Types: Cigarettes   Smokeless tobacco: Never   Tobacco comments:    Pt stated that he smokes  Vaping Use   Vaping status: Never Used  Substance Use Topics   Alcohol use: Not Currently    Comment: 2025.....socially   Drug use: Not Currently    Types: Cocaine, Marijuana    Comment: 09/04/2016 "haven't touched drugs since 1988"   Family History  Problem Relation Age of Onset   Diabetes Mother    Heart attack Father    Diabetes  Sister    Diabetes Maternal Grandmother    Allergies  Allergen Reactions   Atorvastatin  Other (See Comments)    Pt reports causes myalgias. Tolerating rosuvastatin  low dose    ROS    Objective:    BP 120/72   Pulse 93   Ht 5\' 10"  (1.778 m)   Wt 268 lb (121.6 kg)   SpO2 96%   BMI 38.45 kg/m  BP Readings from Last 3 Encounters:  12/24/23 134/82  12/23/23 120/72  11/18/23 100/60   Wt Readings from Last 3 Encounters:  12/24/23 264 lb 8 oz (120 kg)  12/23/23 268 lb (121.6 kg)  11/18/23 264 lb 6.4 oz (119.9 kg)    Physical Exam Vitals and nursing note reviewed.  Constitutional:      Appearance: Normal appearance.  HENT:     Head: Normocephalic.  Cardiovascular:     Rate and Rhythm:  Normal rate.  Musculoskeletal:     Cervical back: Tenderness present. Pain with movement and muscular tenderness present. Decreased range of motion.     Right knee: Swelling present. Decreased range of motion. Tenderness present over the medial joint line.     Left knee: Normal.     Right lower leg: No edema.     Left lower leg: No edema.     Comments: Patient is able to bear weight but is cautious due to knee pain.   Neurological:     General: No focal deficit present.     Mental Status: He is alert and oriented to person, place, and time.  Psychiatric:        Mood and Affect: Mood normal.        Behavior: Behavior normal.      No results found for any visits on 12/23/23.

## 2023-12-23 NOTE — Telephone Encounter (Signed)
 Requested medications are due for refill today.  Unsure - pt is requesting  Requested medications are on the active medications list.  yes  Last refill. 03/03/2019  Future visit scheduled.   yes  Notes to clinic.  Medication is historical.    Requested Prescriptions  Pending Prescriptions Disp Refills   albuterol  (VENTOLIN  HFA) 108 (90 Base) MCG/ACT inhaler      Sig: Inhale 1-2 puffs into the lungs every 6 (six) hours as needed for wheezing or shortness of breath.     Pulmonology:  Beta Agonists 2 Failed - 12/23/2023  5:26 PM      Failed - Valid encounter within last 12 months    Recent Outpatient Visits           Today Acute pain of right knee   [place] [facility] Amadeo June, MD   4 months ago Cervical radiculopathy   [place] [facility] Cheril Cork Cisco Crest, MD   6 months ago Physical exam, annual   [place] [facility] Jenelle Mis, FNP       Future Appointments             Tomorrow Katheryne Pane, Frederico Jan, MD [facility] at [facility], [facility]            Passed - Last BP in normal range    BP Readings from Last 1 Encounters:  12/23/23 120/72         Passed - Last Heart Rate in normal range    Pulse Readings from Last 1 Encounters:  12/23/23 93

## 2023-12-23 NOTE — Progress Notes (Unsigned)
 Electrophysiology Office Note:    Date:  12/24/2023   ID:  Ian [name], DOB [birth year], MRN [id number]  [facility] Cardiologist:  Lauro Portal, MD  [facility] Electrophysiologist:  Boyce Byes, MD   Referring MD: Lauralee Poll, MD   Chief Complaint: Atrial fibrillation  History of Present Illness:    Ian [name] is a 53 year old man who I am seeing today for an evaluation of atrial fibrillation at the request of Dr. Alease Amend.  The patient has a history of coronary artery disease, longstanding persistent atrial fibrillation, chronic systolic heart failure.  Ian [name] has a history of a PCI to the right coronary after NSTEMI in 2016.  Atrial fibrillation was diagnosed around the time of his NSTEMI and Ian [name] was started on Eliquis .  Ian [name] does not remember ever being on Tikosyn for his atrial fibrillation.  Ian [name] tells me that Ian [name] was never hospitalized for drug initiation.Aaron Aas  Ian [name] did have a cardioversion October 13, 2023 for his atrial fibrillation.  Ian [name] felt better after the cardioversion but unfortunately Ian [name] went back into atrial fibrillation.  Given a reduced ejection fraction, Ian [name] is referred to discuss rhythm control options.  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  Ian [name] takes his Eliquis  twice daily.  Ian [name] felt better when Ian [name] was back in normal rhythm after the cardioversion.     Their past medical, social and family history was reviewed.   ROS:   Please see the history of present illness.    All other systems reviewed and are negative.  EKGs/Labs/Other Studies Reviewed:    The following studies were reviewed today:  Dec 16, 2023 echo EF 40 to 45% RV normal Moderate MAC   November 18, 2023 EKG shows atrial fibrillation.  Ventricular rate 85 bpm.  Narrow QRS.  October 13, 2023 EKG shows sinus rhythm, first-degree AV delay, ventricular rate 47 bpm.  QTc 417 ms.        Physical Exam:    VS:  BP 134/82   Pulse 83   Ht 5'  10" (1.778 m)   Wt 264 lb 8 oz (120 kg)   SpO2 95%   BMI 37.95 kg/m     Wt Readings from Last 3 Encounters:  12/24/23 264 lb 8 oz (120 kg)  12/23/23 268 lb (121.6 kg)  11/18/23 264 lb 6.4 oz (119.9 kg)     GEN: no distress.  Obese CARD: Irregularly irregular, No MRG RESP: No IWOB. CTAB.        ASSESSMENT AND PLAN:    1. Chronic systolic heart failure (HCC)   2. Persistent atrial fibrillation (HCC)     #Persistent atrial fibrillation I suspect Ian [name] has longstanding persistent atrial fibrillation.  It is associated with a reduced left ventricular function.  Rhythm control is indicated.  I discussed rhythm control options.  Given the chronicity of his atrial fibrillation I suspect we will need both an antiarrhythmic drug and catheter ablation.   I have recommended loading with amiodarone as a bridge to catheter ablation.  I discussed the catheter ablation procedure in detail the patient including the risks, recovery and likelihood of success.   Discussed treatment options today for AF including antiarrhythmic drug therapy and ablation. Discussed risks, recovery and likelihood of success with each treatment strategy. Risk, benefits, and alternatives to EP study and ablation for afib were discussed. These risks include but are not limited to stroke, bleeding, vascular damage, tamponade, perforation, damage to  the esophagus, lungs, phrenic nerve and other structures, pulmonary vein stenosis, worsening renal function, coronary vasospasm and death.  Discussed potential need for repeat ablation procedures and antiarrhythmic drugs after an initial ablation. The patient understands these risk and wishes to proceed.  We will therefore proceed with catheter ablation at the next available time.  Carto, ICE, anesthesia are requested for the procedure.  Will also obtain CT PV protocol prior to the procedure to exclude LAA thrombus and further evaluate atrial anatomy.  Continue Eliquis  for stroke  prophylaxis  From my review of his records I do not have evidence that Ian [name] ever was on Tikosyn.  Ian [name] confirms that Ian [name] never was hospitalized to start a drug.  I discussed Tikosyn including its risk during today's office visit Ian [name] is interested in proceeding as a bridge to catheter ablation.  His QTc is about 420 in sinus rhythm and his kidney function is okay.  I do not see any contraindicated medications.  #Chronic systolic heart failure EF 40 to 45% Follows with Dr. Alease Amend in the heart failure clinic NYHA class II-III Continue spironolactone , Entresto , metoprolol , Lasix , Farxiga      Signed, Donelda Fujita T. Marven Slimmer, MD, [facility], [facility] 12/24/2023 8:44 AM    Electrophysiology [facility]

## 2023-12-24 ENCOUNTER — Ambulatory Visit
Admission: RE | Admit: 2023-12-24 | Discharge: 2023-12-24 | Disposition: A | Discharge status: Home / Self Care | Source: Ambulatory Visit | Attending: Family Medicine | Admitting: Family Medicine

## 2023-12-24 ENCOUNTER — Other Ambulatory Visit: Payer: Self-pay

## 2023-12-24 ENCOUNTER — Encounter: Payer: Self-pay | Admitting: Cardiology

## 2023-12-24 ENCOUNTER — Ambulatory Visit: Payer: Self-pay | Admitting: Family Medicine

## 2023-12-24 ENCOUNTER — Encounter: Payer: Self-pay | Admitting: Cardiovascular Disease

## 2023-12-24 ENCOUNTER — Ambulatory Visit: Admitting: Cardiovascular Disease

## 2023-12-24 ENCOUNTER — Ambulatory Visit: Attending: Cardiology | Admitting: Cardiology

## 2023-12-24 VITALS — BP 134/82 | HR 83 | Ht 70.0 in | Wt 264.5 lb

## 2023-12-24 VITALS — BP 136/74 | HR 83 | Ht 70.0 in | Wt 264.0 lb

## 2023-12-24 DIAGNOSIS — I255 Ischemic cardiomyopathy: Secondary | ICD-10-CM

## 2023-12-24 DIAGNOSIS — E782 Mixed hyperlipidemia: Secondary | ICD-10-CM | POA: Insufficient documentation

## 2023-12-24 DIAGNOSIS — Z720 Tobacco use: Secondary | ICD-10-CM

## 2023-12-24 DIAGNOSIS — I4819 Other persistent atrial fibrillation: Secondary | ICD-10-CM | POA: Diagnosis not present

## 2023-12-24 DIAGNOSIS — I480 Paroxysmal atrial fibrillation: Secondary | ICD-10-CM | POA: Diagnosis not present

## 2023-12-24 DIAGNOSIS — I10 Essential (primary) hypertension: Secondary | ICD-10-CM

## 2023-12-24 DIAGNOSIS — I2583 Coronary atherosclerosis due to lipid rich plaque: Secondary | ICD-10-CM | POA: Insufficient documentation

## 2023-12-24 DIAGNOSIS — I5022 Chronic systolic (congestive) heart failure: Secondary | ICD-10-CM

## 2023-12-24 DIAGNOSIS — M5412 Radiculopathy, cervical region: Secondary | ICD-10-CM | POA: Diagnosis not present

## 2023-12-24 DIAGNOSIS — I739 Peripheral vascular disease, unspecified: Secondary | ICD-10-CM | POA: Diagnosis not present

## 2023-12-24 DIAGNOSIS — M25561 Pain in right knee: Secondary | ICD-10-CM | POA: Diagnosis not present

## 2023-12-24 DIAGNOSIS — I2510 Atherosclerotic heart disease of native coronary artery without angina pectoris: Secondary | ICD-10-CM

## 2023-12-24 DIAGNOSIS — M25461 Effusion, right knee: Secondary | ICD-10-CM | POA: Diagnosis not present

## 2023-12-24 DIAGNOSIS — M1711 Unilateral primary osteoarthritis, right knee: Secondary | ICD-10-CM | POA: Diagnosis not present

## 2023-12-24 DIAGNOSIS — M542 Cervicalgia: Secondary | ICD-10-CM

## 2023-12-24 NOTE — Patient Instructions (Addendum)
 Medication Instructions:  Your physician recommends that you continue on your current medications as directed. Please refer to the Current Medication list given to you today.  *If you need a refill on your cardiac medications before your next appointment, please call your pharmacy*  Lab Work: BMET and CBC - you may go to any [facility] location to have these drawn within 30 days of your procedure  Testing/Procedures: Cardiac CT Your physician has requested that you have cardiac CT. Cardiac computed tomography (CT) is a painless test that uses an x-ray machine to take clear, detailed pictures of your heart. For further information please visit [email or web address]  We will call you to schedule your CT scan. It will be done about three weeks prior to your ablation.  Ablation Your physician has recommended that you have an ablation. Catheter ablation is a medical procedure used to treat some cardiac arrhythmias (irregular heartbeats). During catheter ablation, a long, thin, flexible tube is put into a blood vessel in your groin (upper thigh), or neck. This tube is called an ablation catheter. It is then guided to your heart through the blood vessel. Radio frequency waves destroy small areas of heart tissue where abnormal heartbeats may cause an arrhythmia to start.  You are scheduled for Atrial Fibrillation Ablation on Friday, August 8 with Dr. Harvie Liner.Please arrive at the Main Entrance A at [facility]: [address] [place], [place] [address] at 8:00 AM    Follow-Up: At [facility], you and your health needs are our priority.  As part of our continuing mission to provide you with exceptional heart care, we have created designated Provider Care Teams.  These Care Teams include your primary Cardiologist (physician) and Advanced Practice Providers (APPs -  Physician Assistants and Nurse Practitioners) who all work together to provide you with the care you need, when you need it.    Your next appointment:    Tikosyn (Dofetilide) Hospital Admission   Prior to day of admission:  Check with drug insurance company for cost of drug to ensure affordability --- Dofetilide 500 mcg twice a day.  GoodRx is an option if insurance copay is unaffordable.    No Benadryl  is allowed 3 days prior to admission.   Please ensure no missed doses of your anticoagulation (blood thinner) for 3 weeks prior to admission. If a dose is missed please notify our office immediately.   A pharmacist will review all your medications for potential interactions with Tikosyn. If any medication changes are needed prior to admission we will be in touch with you.   If any new medications are started AFTER your admission date is set with Radio producer. Please notify our office immediately so your medication list can be updated and reviewed by our pharmacist again.  On day of admission:  Tikosyn initiation requires a 3 night/4 day hospital stay with constant telemetry monitoring. You will have an EKG after each dose of Tikosyn as well as daily lab draws.   If the drug does not convert you to normal rhythm a cardioversion after the 4th dose of Tikosyn.   Afib Clinic office visit on the morning of admission is needed for preliminary labs/ekg.   Time of admission is dependent on bed availability in the hospital. In some instances, you will be sent home until bed is available. Rarely admission can be delayed to the following day if hospital census prevents available beds.   You may bring personal belongings/clothing with you to the hospital. Please  leave your suitcase in the car until you arrive in admissions.   Questions please call our office at [phone]

## 2023-12-24 NOTE — Assessment & Plan Note (Signed)
 History of persistent a fib on Eliquis  oral anticoagulation status post attempted DC cardioversion by Dr. Alease Amend which lasted for 1 week.  He did see Dr. Marven Slimmer today and have agreed to pursue Tikosyn loading as a bridge to A-fib ablation.

## 2023-12-24 NOTE — Assessment & Plan Note (Signed)
 Ongoing tobacco abuse of 1/2 pack/day down from 2-1/2 packs a day.  We had a long discussion with regards to smoking cessation.

## 2023-12-24 NOTE — Assessment & Plan Note (Signed)
 History of ischemic cardiomyopathy with an EF initially of 35 to 40%.  He was placed on GDMT by Dr. Alease Amend in the advanced heart failure clinic.  His most recent echo performed 12/16/2023 revealed an EF of 40 to 45% representing a slight improvement LV function.  Given his EF.  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

## 2023-12-24 NOTE — Assessment & Plan Note (Signed)
 History of hyperlipidemia on rosuvastatin  lipid profile performed 06/01/2023 revealing total cholesterol 126, LDL 68 and HDL of 34.

## 2023-12-24 NOTE — Assessment & Plan Note (Signed)
 History of essential hypertension with blood pressure measured today 136/74.  He is on metoprolol  and Entresto .

## 2023-12-24 NOTE — Patient Instructions (Signed)
 Medication Instructions:  Your physician recommends that you continue on your current medications as directed. Please refer to the Current Medication list given to you today.  *If you need a refill on your cardiac medications before your next appointment, please call your pharmacy*  Follow-Up: At [facility], you and your health needs are our priority.  As part of our continuing mission to provide you with exceptional heart care, our providers are all part of one team.  This team includes your primary Cardiologist (physician) and Advanced Practice Providers or APPs (Physician Assistants and Nurse Practitioners) who all work together to provide you with the care you need, when you need it.  Your next appointment:   4 month(s)  Provider:   Lauro Portal, MD    We recommend signing up for the patient portal called "MyChart".  Sign up information is provided on this After Visit Summary.  MyChart is used to connect with patients for Virtual Visits (Telemedicine).  Patients are able to view lab/test results, encounter notes, upcoming appointments, etc.  Non-urgent messages can be sent to your provider as well.   To learn more about what you can do with MyChart, go to [email or web address].

## 2023-12-24 NOTE — Assessment & Plan Note (Signed)
 History of CAD status post multiple coronary interventions dating back to 2016 when he had RCA stent.  He had orbital atherectomy of his RCA and restenting 09/05/2016.  He denies chest pain.  He did have a myocardial perfusion study performed 07/18/2023 that was nonischemic.

## 2023-12-24 NOTE — Assessment & Plan Note (Signed)
 History of PAD status post peripheral angiography performed by myself 03/27/2020 revealing occluded left common iliac artery which I was able to cross and stent with a VBX covered stent.  His symptoms resolved and his Dopplers improved.  Unfortunately, recent Doppler studies revealed a decline in his left ABI down to 0.64 with probably occlusion of his iliac stent.  He needs reangiography which I am going to delay until after his EP study.

## 2023-12-24 NOTE — Progress Notes (Signed)
 12/24/2023 Ian [name]   12-15-1970  [id number]  Primary Physician Jenelle Mis, FNP Primary Cardiologist: Avanell Leigh MD FACP, [place], [place], [place]  HPI:  Ian [name] is a 53 y.o.  moderately overweight separated Caucasian male father of 3 children, grandfather of 3 grandchildren who I last saw in the office 07/28/2023.  He was referred by Dr. Nicholette Barley, his cardiologist, for peripheral vascular evaluation because of lifestyle imaging claudication.  He works as a Agricultural consultant.  His risk factors include 60-pack-year tobacco abuse continue to smoke 1-1/2 packs a day recalcitrant to risk factor modification.  He has treated diabetes and hyperlipidemia.  He also has PAF on Eliquis .  His family history is remarkable for father who apparently had CAD.  He has had multiple coronary interventions dating back to 2016 when he had RCA stent.  He had orbital atherectomy his RCA restenting 09/05/2016.  He has had claudication for 4 years primarily on the left side with recent Doppler studies performed 03/06/2020 revealing a right ABI that was normal and a left of 0.53 with an occluded left common iliac artery.   I performed peripheral angiography on him 03/27/2020 revealing an occluded left common iliac artery which I was able to cross and stented with a VBX covered stent.  He was discharged home the following day.  His Dopplers performed 8/21 unfortunately, he does continue to smoke however.   Since I saw him in the office 5 months ago I did refer him to Dr. Alease Amend for treatment of his cardiomyopathy for medication titration GDMT.  He underwent attempted DC cardioversion which was initially successful but reverted back to A-fib 1 week later.  He was sent to Dr. Marven Slimmer today for discussion about treatment options, rhythm versus rate control.  It was decided to pursue rhythm control with Tikosyn bridge to ablation.  He still complains of left lower extremity claudication which is lifestyle  limiting.  His smoking is down to 1/2 pack/day.  We talked about the importance of smoking cessation.   Current Meds  Medication Sig   albuterol  (VENTOLIN  HFA) 108 (90 Base) MCG/ACT inhaler Inhale 1-2 puffs into the lungs every 6 (six) hours as needed for wheezing or shortness of breath.    apixaban  (ELIQUIS ) 5 MG TABS tablet Take 1 tablet (5 mg total) by mouth 2 (two) times daily.   Blood Glucose Monitoring Suppl DEVI 1 each by Does not apply route in the morning, at noon, and at bedtime. May substitute to any manufacturer covered by patient's insurance.   CINNAMON PO Take 1,000 mg by mouth 2 (two) times daily.   clopidogrel  (PLAVIX ) 75 MG tablet Take 1 tablet (75 mg total) by mouth daily.   dapagliflozin  propanediol (FARXIGA ) 5 MG TABS tablet Take 1 tablet (5 mg total) by mouth daily.   FEXOFENADINE HCL PO Take 1 tablet by mouth daily.   furosemide  (LASIX ) 40 MG tablet Take 40 mg by mouth daily.   gabapentin (NEURONTIN) 100 MG capsule Start with one and increase to 3 at night time   glipiZIDE  (GLUCOTROL ) 10 MG tablet TAKE 1 TABLET BY MOUTH TWICE DAILY BEFORE A MEAL   metFORMIN  (GLUCOPHAGE ) 1000 MG tablet Take 1 tablet (1,000 mg total) by mouth 2 (two) times daily with a meal.   metoprolol  succinate (TOPROL -XL) 100 MG 24 hr tablet Take 1 tablet (100 mg total) by mouth daily. Take with or immediately following a meal.   nitroGLYCERIN  (NITROSTAT ) 0.4 MG SL tablet Place 1  tablet under the tongue every 5 (five) minutes as needed.   Omega 3 1000 MG CAPS Take 1,000 mg by mouth 2 (two) times daily.   rosuvastatin  (CRESTOR ) 20 MG tablet Take 1 tablet (20 mg total) by mouth daily.   sacubitril -valsartan  (ENTRESTO ) 24-26 MG Take 1 tablet by mouth 2 (two) times daily. START THIS ON THURSDAY 2/13   sildenafil  (VIAGRA ) 50 MG tablet Take 1 tablet (50 mg total) by mouth daily as needed for erectile dysfunction.   spironolactone  (ALDACTONE ) 25 MG tablet Take 1 tablet (25 mg total) by mouth daily.    triamcinolone cream (KENALOG) 0.1 % Apply 1 Application topically daily as needed (rash).     Allergies  Allergen Reactions   Atorvastatin  Other (See Comments)    Pt reports causes myalgias. Tolerating rosuvastatin  low dose    Social History   Socioeconomic History   Marital status: Divorced    Spouse name: Not on file   Number of children: 3   Years of education: Not on file   Highest education level: Not on file  Occupational History   Not on file  Tobacco Use   Smoking status: Every Day    Current packs/day: 0.50    Average packs/day: 0.5 packs/day for 35.0 years (17.5 ttl pk-yrs)    Types: Cigarettes   Smokeless tobacco: Never   Tobacco comments:    Pt stated that he smokes  Vaping Use   Vaping status: Never Used  Substance and Sexual Activity   Alcohol use: Not Currently    Comment: 2025.....socially   Drug use: Not Currently    Types: Cocaine, Marijuana    Comment: 09/04/2016 "haven't touched drugs since 1988"   Sexual activity: Yes  Other Topics Concern   Not on file  Social History Narrative   Employed as a Naval architect. Resides in [place]  and [place]       2 stents to heart 2016, 2017   1 stent to LLE 2021   Social Drivers of Corporate investment banker Strain: Not on file  Food Insecurity: Not on file  Transportation Needs: Not on file  Physical Activity: Not on file  Stress: Not on file  Social Connections: Not on file  Intimate Partner Violence: Not on file     Review of Systems: General: negative for chills, fever, night sweats or weight changes.  Cardiovascular: negative for chest pain, dyspnea on exertion, edema, orthopnea, palpitations, paroxysmal nocturnal dyspnea or shortness of breath Dermatological: negative for rash Respiratory: negative for cough or wheezing Urologic: negative for hematuria Abdominal: negative for nausea, vomiting, diarrhea, bright red blood per rectum, melena, or hematemesis Neurologic: negative for visual changes,  syncope, or dizziness All other systems reviewed and are otherwise negative except as noted above.    Blood pressure 136/74, pulse 83, height 5\' 10"  (1.778 m), weight 264 lb (119.7 kg), SpO2 95%.  General appearance: alert and no distress Neck: no adenopathy, no carotid bruit, no JVD, supple, symmetrical, trachea midline, and thyroid not enlarged, symmetric, no tenderness/mass/nodules Lungs: clear to auscultation bilaterally Heart: irregularly irregular rhythm Extremities: Diminished pedal pulses Pulses: Diminished pedal pulses Skin: Skin color, texture, turgor normal. No rashes or lesions Neurologic: Grossly normal  EKG not performed today      ASSESSMENT AND PLAN:   Hyperlipidemia History of hyperlipidemia on rosuvastatin  lipid profile performed 06/01/2023 revealing total cholesterol 126, LDL 68 and HDL of 34.  Tobacco abuse Ongoing tobacco abuse of 1/2 pack/day down from 2-1/2 packs a day.  We  had a long discussion with regards to smoking cessation.  PAF (paroxysmal atrial fibrillation) (HCC) History of persistent a fib on Eliquis  oral anticoagulation status post attempted DC cardioversion by Dr. Alease Amend which lasted for 1 week.  He did see Dr. Marven Slimmer today and have agreed to pursue Tikosyn loading as a bridge to A-fib ablation.  Essential hypertension History of essential hypertension with blood pressure measured today 136/74.  He is on metoprolol  and Entresto .  Coronary artery disease due to lipid rich plaque History of CAD status post multiple coronary interventions dating back to 2016 when he had RCA stent.  He had orbital atherectomy of his RCA and restenting 09/05/2016.  He denies chest pain.  He did have a myocardial perfusion study performed 07/18/2023 that was nonischemic.  Ischemic cardiomyopathy History of ischemic cardiomyopathy with an EF initially of 35 to 40%.  He was placed on GDMT by Dr. Alease Amend in the advanced heart failure clinic.  His most recent echo performed  12/16/2023 revealed an EF of 40 to 45% representing a slight improvement LV function.  Given his EF.  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  Peripheral arterial disease (HCC) History of PAD status post peripheral angiography performed by myself 03/27/2020 revealing occluded left common iliac artery which I was able to cross and stent with a VBX covered stent.  His symptoms resolved and his Dopplers improved.  Unfortunately, recent Doppler studies revealed a decline in his left ABI down to 0.64 with probably occlusion of his iliac stent.  He needs reangiography which I am going to delay until after his EP study.     Avanell Leigh MD FACP,FACC,FAHA, [facility] 12/24/2023 3:51 PM

## 2023-12-25 ENCOUNTER — Telehealth: Payer: Self-pay | Admitting: Pharmacist

## 2023-12-25 NOTE — Telephone Encounter (Signed)
 Medication list reviewed in anticipation of upcoming Tikosyn initiation. Patient is not taking any contraindicated medications. He is taking 1 QTc prolonging medication and a few other interacting medications.    Furosemide : Monitor electrolytes and kidney function closely. Please ensure K is at least 4 and Magnesium  at least 2 prior to admission   Albuterol : Can increase risk of QTc prolongation. This is a prn medication. No changes are needed. Monitor QTC  Metformin : May increase the concentration of Tikosyn. Monitor for s/sx of toxicity. Not change is therapy needed.   Spironolactone : Monitor electrolytes and kidney function closely  Patient is anticoagulated on Eliquis  on the appropriate dose. Please ensure that patient has not missed any anticoagulation doses in the 3 weeks prior to Tikosyn initiation.   Patient will need to be counseled to avoid use of Benadryl  while on Tikosyn and in the 2-3 days prior to Tikosyn initiation.

## 2024-01-02 ENCOUNTER — Encounter (HOSPITAL_COMMUNITY): Payer: Self-pay | Admitting: *Deleted

## 2024-01-08 ENCOUNTER — Encounter: Payer: Self-pay | Admitting: Family Medicine

## 2024-01-08 ENCOUNTER — Other Ambulatory Visit: Payer: Self-pay

## 2024-01-08 DIAGNOSIS — Z9861 Coronary angioplasty status: Secondary | ICD-10-CM

## 2024-01-08 DIAGNOSIS — I2510 Atherosclerotic heart disease of native coronary artery without angina pectoris: Secondary | ICD-10-CM

## 2024-01-08 DIAGNOSIS — E782 Mixed hyperlipidemia: Secondary | ICD-10-CM

## 2024-01-08 DIAGNOSIS — E118 Type 2 diabetes mellitus with unspecified complications: Secondary | ICD-10-CM

## 2024-01-08 DIAGNOSIS — I10 Essential (primary) hypertension: Secondary | ICD-10-CM

## 2024-01-08 DIAGNOSIS — I480 Paroxysmal atrial fibrillation: Secondary | ICD-10-CM

## 2024-01-08 MED ORDER — SILDENAFIL CITRATE 50 MG PO TABS: 50.0000 mg | ORAL_TABLET | Freq: Every day | ORAL | 6 refills | Status: AC | PRN

## 2024-01-08 MED ORDER — ALBUTEROL SULFATE HFA 108 (90 BASE) MCG/ACT IN AERS: 1.0000 | INHALATION_SPRAY | Freq: Four times a day (QID) | RESPIRATORY_TRACT | 6 refills | Status: AC | PRN

## 2024-01-09 ENCOUNTER — Telehealth: Payer: Self-pay | Admitting: Family Medicine

## 2024-01-13 ENCOUNTER — Other Ambulatory Visit (HOSPITAL_COMMUNITY): Payer: Self-pay

## 2024-01-13 NOTE — Telephone Encounter (Signed)
 Good morning his inhaler was filled on 01/08/24 and the next refill date is after 01/26/24.

## 2024-01-14 ENCOUNTER — Telehealth (HOSPITAL_COMMUNITY): Payer: Self-pay | Admitting: *Deleted

## 2024-01-14 NOTE — Telephone Encounter (Signed)
 Inpatient hospital stay for Tikosyn approved for 02/03/24 - 02/06/24  auth # [id number]

## 2024-01-15 NOTE — Telephone Encounter (Signed)
 You're welcome!

## 2024-01-19 ENCOUNTER — Other Ambulatory Visit: Payer: Self-pay | Admitting: Family Medicine

## 2024-01-19 DIAGNOSIS — M542 Cervicalgia: Secondary | ICD-10-CM

## 2024-01-19 DIAGNOSIS — R200 Anesthesia of skin: Secondary | ICD-10-CM

## 2024-01-20 ENCOUNTER — Ambulatory Visit (HOSPITAL_COMMUNITY): Admitting: Internal Medicine

## 2024-01-28 ENCOUNTER — Ambulatory Visit (HOSPITAL_BASED_OUTPATIENT_CLINIC_OR_DEPARTMENT_OTHER): Attending: Cardiology | Admitting: Cardiology

## 2024-01-28 VITALS — Ht 70.0 in | Wt 260.0 lb

## 2024-01-28 DIAGNOSIS — G4736 Sleep related hypoventilation in conditions classified elsewhere: Secondary | ICD-10-CM | POA: Insufficient documentation

## 2024-01-28 DIAGNOSIS — G4733 Obstructive sleep apnea (adult) (pediatric): Secondary | ICD-10-CM | POA: Diagnosis not present

## 2024-01-28 DIAGNOSIS — I5022 Chronic systolic (congestive) heart failure: Secondary | ICD-10-CM

## 2024-01-30 ENCOUNTER — Encounter (HOSPITAL_COMMUNITY): Payer: Self-pay

## 2024-02-02 NOTE — Procedures (Signed)
 [facility] [address] [place], [place] [address] Tel: [phone]   Fax: [phone]  Split Night Interpretation  Patient Name:  Aadan, Chenier Date:  01/28/2024 Referring Physician:  Morene Brownie, MD  Indications for Polysomnography The patient is a 53 year old Male who is 5' 10 and weighs 260.0 lbs.  His BMI equals 37.6.  A diagnostic polysomnogram was performed to evaluate for Obstructive Sleep Apnea.  After 126.5 minutes of sleep time the patient exhibited sufficient respiratory events qualifying him for a CPAP trial which was then initiated.    Medication  No Data.   Polysomnogram Data A full night polysomnogram was performed recording the standard physiologic parameters including EEG, EOG, EMG, EKG, nasal and oral airflow.  Respiratory parameters of chest and abdominal movements are recorded with Peizo-Crystal motion transducers.  Oxygen saturation was recorded by pulse oximetry.    Sleep Architecture The total recording time of the diagnostic portion of the study was 150.9 minutes.  The total sleep time was 126.5 minutes.  During the diagnostic portion of the study, the patient spent 5.9% of total sleep time in Stage N1, 87.4% in Stage N2, 0.0% in Stages N3, and 6.7% in REM.   Sleep latency was 12.3 minutes.  REM latency was 114.5 minutes.  Sleep Efficiency was 83.8%.  Wake after Sleep Onset time was 12.0 minutes.   At 12:52:43 AM the patient was placed on PAP treatment and was titrated at pressures ranging from 4 cm/H20 with supplemental oxygen at O2: 1L up to 12 cm/H20 with supplemental oxygen at O2: 2L.  The total recording time of the treatment portion of the study was 234.9 minutes.  The total sleep time was 185.0 minutes.  During the treatment portion of the study, the patient spent 10.8% of total sleep time in Stage N1, 67.0% in Stage N2, 0.0% in Stages N3, and 22.2% in REM.   Sleep latency was 0.0 minutes.  REM latency  was 96.0 minutes.  Sleep Efficiency was 78.8%.  Wake after Sleep Onset time was 49.5 minutes.  Respiratory Events During the diagnostic portion of the study, the polysomnogram revealed a presence of 0 obstructive, 1 central, and 0 mixed apneas resulting in an Apnea index of 0.5 events per hour.  There were 48 hypopneas (>=3% desaturation and/or arousal) resulting in an Apnea\Hypopnea Index (AHI >=3% desaturation and/or arousal) of 23.2 events per hour.  There were 34 hypopneas (>=4% desaturation) resulting in an Apnea\Hypopnea Index (AHI >=4% desaturation) of 16.6 events per hour.  There were 1 Respiratory Effort Related Arousals resulting in a RERA index of 0.5 events per hour. The Respiratory Disturbance Index is 23.7 events per hour.  The snore index was 0 events per hour.  Mean oxygen saturation was 89.8%.  The lowest oxygen saturation during sleep was 84.0%.  Time spent <=88% oxygen saturation was 45.7 minutes (30.3%).  During the treatment portion of the study, the polysomnogram revealed a presence of 1 obstructive, 2 central, and 2 mixed apneas resulting in an Apnea index of 1.6 events per hour.  There were 35 hypopneas (>=3% desaturation and/or arousal) resulting in an Apnea\Hypopnea Index (AHI >=3% desaturation and/or arousal) of 13.0 events per hour.  There were 17 hypopneas (>=4% desaturation) resulting in an Apnea\Hypopnea Index (AHI >=4% desaturation) of 7.1 events per hour.  There were 1 Respiratory Effort Related Arousals resulting in a RERA index of 0.3 events per hour. The Respiratory Disturbance Index is 13.3 events per  hour.  The snore index was 0 events per hour.  Mean oxygen saturation was 91.2%.  The lowest oxygen saturation during sleep was 81.0%.  Time spent <=88% oxygen saturation was 28.6 minutes (12.3%).  Limb Activity During the diagnostic portion of the study, there were 0 limb movements recorded.   During the treatment portion of the study, there were 74 limb movements  recorded.  Of this total, 46 were classified as PLMs.  Of the PLMs, 0 were associated with arousals.  The Limb Movement index was 24.0 per hour while the PLM index was 14.9 per hour.  Cardiac Summary During the diagnostic portion of the study, the average pulse rate was 65.8 bpm.  The minimum pulse rate was 48.0 bpm while the maximum pulse rate was 83.0 bpm.  During the treatment portion of the study, the average pulse rate was 63.2 bpm.  The minimum pulse rate was 42.0 bpm while the maximum pulse rate was 80.0 bpm.   Diagnosis:  Moderate Obstructive Sleep Apnea Nocturnal Hypoxemia  Recommendations: Recommend a trial of ResMed CPAP at 12cm H2O with heated humidity, medium ResMed Airfit F20 mask and supplemental O2 at 2L via CPAP. The patient should be counseled in good sleep hygiene and avoid sleeping in the supine position. Encourage patient to avoid driving when sleepy. Followup in Sleep clinic in 6 weeks.   This study was personally reviewed and electronically signed by: Wilbert Bihari, MD Accredited Board Certified in Sleep Medicine Date/Time: 02/02/2024 8:05PM

## 2024-02-03 ENCOUNTER — Ambulatory Visit (INDEPENDENT_AMBULATORY_CARE_PROVIDER_SITE_OTHER)
Admission: RE | Admit: 2024-02-03 | Discharge: 2024-02-03 | Disposition: A | Discharge status: Home / Self Care | Source: Ambulatory Visit | Attending: Internal Medicine | Admitting: Internal Medicine

## 2024-02-03 ENCOUNTER — Inpatient Hospital Stay (HOSPITAL_COMMUNITY)
Admission: RE | Admit: 2024-02-03 | Discharge: 2024-02-06 | DRG: 309 | Disposition: A | Discharge status: Home / Self Care | Source: Ambulatory Visit | Attending: Cardiology | Admitting: Cardiology

## 2024-02-03 ENCOUNTER — Ambulatory Visit (HOSPITAL_COMMUNITY): Payer: Self-pay | Admitting: Internal Medicine

## 2024-02-03 ENCOUNTER — Other Ambulatory Visit: Payer: Self-pay

## 2024-02-03 ENCOUNTER — Telehealth (HOSPITAL_COMMUNITY): Payer: Self-pay | Admitting: Pharmacy Technician

## 2024-02-03 ENCOUNTER — Other Ambulatory Visit (HOSPITAL_COMMUNITY): Payer: Self-pay

## 2024-02-03 ENCOUNTER — Encounter (HOSPITAL_COMMUNITY): Payer: Self-pay | Admitting: Cardiology

## 2024-02-03 VITALS — BP 102/70 | HR 73 | Ht 70.0 in | Wt 265.8 lb

## 2024-02-03 DIAGNOSIS — D6869 Other thrombophilia: Secondary | ICD-10-CM | POA: Insufficient documentation

## 2024-02-03 DIAGNOSIS — Z7902 Long term (current) use of antithrombotics/antiplatelets: Secondary | ICD-10-CM

## 2024-02-03 DIAGNOSIS — E785 Hyperlipidemia, unspecified: Secondary | ICD-10-CM | POA: Diagnosis present

## 2024-02-03 DIAGNOSIS — Z7901 Long term (current) use of anticoagulants: Secondary | ICD-10-CM | POA: Insufficient documentation

## 2024-02-03 DIAGNOSIS — Z5181 Encounter for therapeutic drug level monitoring: Secondary | ICD-10-CM | POA: Diagnosis not present

## 2024-02-03 DIAGNOSIS — I110 Hypertensive heart disease with heart failure: Secondary | ICD-10-CM | POA: Insufficient documentation

## 2024-02-03 DIAGNOSIS — I4891 Unspecified atrial fibrillation: Secondary | ICD-10-CM | POA: Diagnosis not present

## 2024-02-03 DIAGNOSIS — Z79899 Other long term (current) drug therapy: Secondary | ICD-10-CM

## 2024-02-03 DIAGNOSIS — I5022 Chronic systolic (congestive) heart failure: Secondary | ICD-10-CM | POA: Insufficient documentation

## 2024-02-03 DIAGNOSIS — E119 Type 2 diabetes mellitus without complications: Secondary | ICD-10-CM | POA: Insufficient documentation

## 2024-02-03 DIAGNOSIS — Z7984 Long term (current) use of oral hypoglycemic drugs: Secondary | ICD-10-CM | POA: Insufficient documentation

## 2024-02-03 DIAGNOSIS — Z888 Allergy status to other drugs, medicaments and biological substances status: Secondary | ICD-10-CM | POA: Diagnosis not present

## 2024-02-03 DIAGNOSIS — I2510 Atherosclerotic heart disease of native coronary artery without angina pectoris: Secondary | ICD-10-CM | POA: Diagnosis present

## 2024-02-03 DIAGNOSIS — I4811 Longstanding persistent atrial fibrillation: Secondary | ICD-10-CM | POA: Diagnosis not present

## 2024-02-03 LAB — BASIC METABOLIC PANEL WITH GFR
Anion gap: 10 (ref 5–15)
BUN/Creatinine Ratio: 15 (ref 9–20)
BUN: 13 mg/dL (ref 6–24)
BUN: 14 mg/dL (ref 6–20)
CO2: 24 mmol/L (ref 20–29)
CO2: 24 mmol/L (ref 22–32)
Calcium: 9.8 mg/dL (ref 8.7–10.2)
Calcium: 9.4 mg/dL (ref 8.9–10.3)
Chloride: 103 mmol/L (ref 96–106)
Chloride: 105 mmol/L (ref 98–111)
Creatinine, Ser: 0.88 mg/dL (ref 0.76–1.27)
Creatinine, Ser: 0.87 mg/dL (ref 0.61–1.24)
GFR, Estimated: >60 mL/min (ref >60)
Glucose, Bld: 172 mg/dL — ABNORMAL HIGH (ref 70–99)
Glucose: 137 mg/dL — ABNORMAL HIGH (ref 70–99)
Potassium: 4.6 mmol/L (ref 3.5–5.2)
Potassium: 4 mmol/L (ref 3.5–5.1)
Sodium: 141 mmol/L (ref 134–144)
Sodium: 139 mmol/L (ref 135–145)
eGFR: 102 mL/min/{1.73_m2} (ref >59)

## 2024-02-03 LAB — GLUCOSE, CAPILLARY: Glucose-Capillary: 124 mg/dL — ABNORMAL HIGH (ref 70–99)

## 2024-02-03 LAB — MAGNESIUM
Magnesium: 1.9 mg/dL (ref 1.6–2.3)
Magnesium: 2.1 mg/dL (ref 1.7–2.4)

## 2024-02-03 MED ORDER — FUROSEMIDE 40 MG PO TABS
40.0000 mg | ORAL_TABLET | Freq: Every day | ORAL | Status: DC
  Administered 2024-02-04 – 2024-02-06 (×3): 40 mg via ORAL
  Filled 2024-02-03 (×3): qty 1

## 2024-02-03 MED ORDER — METOPROLOL SUCCINATE ER 100 MG PO TB24
100.0000 mg | ORAL_TABLET | Freq: Every day | ORAL | Status: DC
  Administered 2024-02-04 – 2024-02-06 (×3): 100 mg via ORAL
  Filled 2024-02-03 (×3): qty 1

## 2024-02-03 MED ORDER — SODIUM CHLORIDE 0.9 % IV SOLN: 250.0000 mL | INTRAVENOUS | Status: AC | PRN

## 2024-02-03 MED ORDER — APIXABAN 5 MG PO TABS
5.0000 mg | ORAL_TABLET | Freq: Two times a day (BID) | ORAL | Status: DC
  Administered 2024-02-03 – 2024-02-06 (×6): 5 mg via ORAL
  Filled 2024-02-03: qty 2
  Filled 2024-02-03 (×3): qty 1
  Filled 2024-02-03: qty 2
  Filled 2024-02-03: qty 1

## 2024-02-03 MED ORDER — METFORMIN HCL 500 MG PO TABS
1000.0000 mg | ORAL_TABLET | Freq: Two times a day (BID) | ORAL | Status: DC
  Administered 2024-02-03 – 2024-02-06 (×6): 1000 mg via ORAL
  Filled 2024-02-03 (×6): qty 2

## 2024-02-03 MED ORDER — CLOPIDOGREL BISULFATE 75 MG PO TABS
75.0000 mg | ORAL_TABLET | Freq: Every day | ORAL | Status: DC
  Administered 2024-02-04 – 2024-02-06 (×3): 75 mg via ORAL
  Filled 2024-02-03 (×3): qty 1

## 2024-02-03 MED ORDER — GLIPIZIDE 10 MG PO TABS
10.0000 mg | ORAL_TABLET | Freq: Two times a day (BID) | ORAL | Status: DC
  Administered 2024-02-03 – 2024-02-06 (×6): 10 mg via ORAL
  Filled 2024-02-03 (×7): qty 1

## 2024-02-03 MED ORDER — SODIUM CHLORIDE 0.9% FLUSH: 3.0000 mL | INTRAVENOUS | Status: DC | PRN

## 2024-02-03 MED ORDER — METFORMIN HCL 500 MG PO TABS: 1000.0000 mg | ORAL_TABLET | Freq: Two times a day (BID) | ORAL | Status: DC

## 2024-02-03 MED ORDER — SODIUM CHLORIDE 0.9% FLUSH
3.0000 mL | Freq: Two times a day (BID) | INTRAVENOUS | Status: DC
  Administered 2024-02-03 – 2024-02-05 (×3): 3 mL via INTRAVENOUS

## 2024-02-03 MED ORDER — ROSUVASTATIN CALCIUM 20 MG PO TABS
20.0000 mg | ORAL_TABLET | Freq: Every day | ORAL | Status: DC
  Administered 2024-02-04 – 2024-02-06 (×3): 20 mg via ORAL
  Filled 2024-02-03 (×3): qty 1

## 2024-02-03 MED ORDER — GABAPENTIN 300 MG PO CAPS
300.0000 mg | ORAL_CAPSULE | Freq: Every day | ORAL | Status: DC
  Administered 2024-02-03 – 2024-02-05 (×3): 300 mg via ORAL
  Filled 2024-02-03 (×3): qty 1

## 2024-02-03 MED ORDER — GLIPIZIDE 10 MG PO TABS
10.0000 mg | ORAL_TABLET | Freq: Two times a day (BID) | ORAL | Status: DC
  Filled 2024-02-03: qty 1

## 2024-02-03 MED ORDER — DOFETILIDE 500 MCG PO CAPS
500.0000 ug | ORAL_CAPSULE | Freq: Two times a day (BID) | ORAL | Status: DC
Start: 2024-02-03 — End: 2024-02-04
  Administered 2024-02-03 – 2024-02-04 (×2): 500 ug via ORAL
  Filled 2024-02-03 (×2): qty 1

## 2024-02-03 MED ORDER — SPIRONOLACTONE 25 MG PO TABS
25.0000 mg | ORAL_TABLET | Freq: Every day | ORAL | Status: DC
  Administered 2024-02-04 – 2024-02-06 (×3): 25 mg via ORAL
  Filled 2024-02-03 (×3): qty 1

## 2024-02-03 MED ORDER — SACUBITRIL-VALSARTAN 24-26 MG PO TABS
1.0000 | ORAL_TABLET | Freq: Two times a day (BID) | ORAL | Status: DC
  Administered 2024-02-03 – 2024-02-06 (×6): 1 via ORAL
  Filled 2024-02-03 (×6): qty 1

## 2024-02-03 MED ORDER — MAGNESIUM SULFATE 2 GM/50ML IV SOLN
2.0000 g | Freq: Once | INTRAVENOUS | Status: AC
  Administered 2024-02-03: 2 g via INTRAVENOUS
  Filled 2024-02-03: qty 50

## 2024-02-03 NOTE — Telephone Encounter (Signed)
 Patient Product/process development scientist completed.    The patient is insured through [employer].     Ran test claim for dofetilide (Tikosyn) 500 mcg and the current 30 day co-pay is $4.00.   This test claim was processed through [facility]- copay amounts may vary at other pharmacies due to pharmacy/plan contracts, or as the patient moves through the different stages of their insurance plan.     Ian [name], CPHT Pharmacy Technician III Certified Patient Advocate [facility] Patient Advocate Team Direct Number: [phone]  Fax: [phone]

## 2024-02-03 NOTE — TOC CM/SW Note (Signed)
 Transition of Care [facility]) - Inpatient Brief Assessment   Patient Details  Name: Ian [name] MRN: [id number] Date of Birth: Nov 16, 1970  Transition of Care [facility]) CM/SW Contact:    Lauraine FORBES Saa, LCSW Phone Number: 02/03/2024, 3:33 PM   Clinical Narrative:  3:33 PM Per chart review, patient resides at home alone. Patient has a PCP and insurance. Patient does not have SNF or HH history. Patient has home CPAP. Patient's preferred pharmacy is [facility] [address]. TOC consult was placed for Tikosyn initiation. TOC will continue to follow.  Transition of Care Asessment: Insurance and Status: Insurance coverage has been reviewed Patient has primary care physician: Yes Home environment has been reviewed: Private Residence Prior level of function:: N/A Prior/Current Home Services: No current home services Social Drivers of Health Review: SDOH reviewed no interventions necessary Readmission risk has been reviewed: Yes Transition of care needs: transition of care needs identified, TOC will continue to follow

## 2024-02-03 NOTE — Progress Notes (Signed)
 Primary Care Physician: Kayla Jeoffrey RAMAN, FNP Primary Cardiologist: Dorn Lesches, MD Electrophysiologist: [name], MD     Referring Physician: Dr. HOLTS [name] is a 53 y.o. male with a history of CAD, chronic systolic CHF, HLD, HTN, PAD, and (longstanding persistent suspected) atrial fibrillation who presents for consultation in the [facility]. Patient is on Eliquis  5 mg BID for a CHADS2VASC score of 3.  On evaluation today, patient is here for Tikosyn admission. He has held Farxiga  for the past 2 days. No new medications since last OV. No benadryl  use. No missed doses of anticoagulant.   Today, he denies symptoms of palpitations, chest pain, shortness of breath, orthopnea, PND, lower extremity edema, dizziness, presyncope, syncope, snoring, daytime somnolence, bleeding, or neurologic sequela. The patient is tolerating medications without difficulties and is otherwise without complaint today.    he has a BMI of Body mass index is 38.14 kg/m.SABRA Filed Weights   02/03/24 0906  Weight: 120.6 kg    Current Outpatient Medications  Medication Sig Dispense Refill   albuterol  (VENTOLIN  HFA) 108 (90 Base) MCG/ACT inhaler Inhale 1-2 puffs into the lungs every 6 (six) hours as needed for wheezing or shortness of breath. 10 each 6   apixaban  (ELIQUIS ) 5 MG TABS tablet Take 1 tablet (5 mg total) by mouth 2 (two) times daily. 180 tablet 1   Blood Glucose Monitoring Suppl DEVI 1 each by Does not apply route in the morning, at noon, and at bedtime. May substitute to any manufacturer covered by patient's insurance. 1 each 0   CINNAMON PO Take 1,000 mg by mouth 2 (two) times daily.     clopidogrel  (PLAVIX ) 75 MG tablet Take 1 tablet (75 mg total) by mouth daily. 90 tablet 3   furosemide  (LASIX ) 40 MG tablet Take 40 mg by mouth daily.     gabapentin  (NEURONTIN ) 100 MG capsule START WITH 1 CAPSULE AND INCREASE TO 3 CAPSULES BY MOUTH NIGHTLY (Patient  taking differently: Take 300 mg by mouth at bedtime.) 90 capsule 0   glipiZIDE  (GLUCOTROL ) 10 MG tablet TAKE 1 TABLET BY MOUTH TWICE DAILY BEFORE A MEAL 180 tablet 0   metFORMIN  (GLUCOPHAGE ) 1000 MG tablet Take 1 tablet (1,000 mg total) by mouth 2 (two) times daily with a meal. 180 tablet 3   metoprolol  succinate (TOPROL -XL) 100 MG 24 hr tablet Take 1 tablet (100 mg total) by mouth daily. Take with or immediately following a meal. 90 tablet 3   nitroGLYCERIN  (NITROSTAT ) 0.4 MG SL tablet Place 1 tablet under the tongue every 5 (five) minutes as needed.     Omega 3 1000 MG CAPS Take 1,000 mg by mouth 2 (two) times daily.     rosuvastatin  (CRESTOR ) 20 MG tablet Take 1 tablet (20 mg total) by mouth daily. 90 tablet 0   sacubitril -valsartan  (ENTRESTO ) 24-26 MG Take 1 tablet by mouth 2 (two) times daily. START THIS ON THURSDAY 2/13 60 tablet 3   sildenafil  (VIAGRA ) 50 MG tablet Take 1 tablet (50 mg total) by mouth daily as needed for erectile dysfunction. (Patient taking differently: Take 50 mg by mouth as needed for erectile dysfunction.) 10 tablet 6   spironolactone  (ALDACTONE ) 25 MG tablet Take 1 tablet (25 mg total) by mouth daily. 30 tablet 3   triamcinolone cream (KENALOG) 0.1 % Apply 1 Application topically daily as needed (rash).     dapagliflozin  propanediol (FARXIGA ) 5 MG TABS tablet Take 1 tablet (5  mg total) by mouth daily. (Patient not taking: Reported on 02/03/2024) 30 tablet 11   No current facility-administered medications for this encounter.    Atrial Fibrillation Management history:  Previous antiarrhythmic drugs: none Previous cardioversions: 10/13/23 Previous ablations: none Anticoagulation history: Eliquis    ROS- All systems are reviewed and negative except as per the HPI above.  Physical Exam: BP 102/70   Pulse 73   Ht 5' 10 (1.778 m)   Wt 120.6 kg   BMI 38.14 kg/m   GEN: Well nourished, well developed in no acute distress NECK: No JVD; No carotid bruits CARDIAC:  Irregularly irregular rate and rhythm, no murmurs, rubs, gallops RESPIRATORY:  Clear to auscultation without rales, wheezing or rhonchi  ABDOMEN: Soft, non-tender, non-distended EXTREMITIES:  No edema; No deformity   EKG today demonstrates  Vent. rate 73 BPM PR interval * ms QRS duration 90 ms QT/QTcB 402/442 ms P-R-T axes * 103 36 Atrial fibrillation Rightward axis Abnormal ECG When compared with ECG of 18-Nov-2023 14:50, No significant change was found Confirmed by Cherrie Sieving [phone]) on 02/03/2024 10:01:05 AM  Echo 12/16/23 demonstrated  1. Left ventricular ejection fraction, by estimation, is 40 to 45%. The  left ventricle has mildly decreased function. The left ventricle  demonstrates global hypokinesis. Left ventricular diastolic parameters are  indeterminate.   2. Right ventricular systolic function is normal. The right ventricular  size is mildly enlarged.   3. The mitral valve is normal in structure. No evidence of mitral valve  regurgitation. No evidence of mitral stenosis. Moderate mitral annular  calcification.   4. The aortic valve is tricuspid. Aortic valve regurgitation is not  visualized. No aortic stenosis is present.   5. The inferior vena cava is normal in size with greater than 50%  respiratory variability, suggesting right atrial pressure of 3 mmHg.   ASSESSMENT & PLAN CHA2DS2-VASc Score = 4  The patient's score is based upon: CHF History: 1 HTN History: 1 Diabetes History: 1 Stroke History: 0 Vascular Disease History: 1 Age Score: 0 Gender Score: 0       ASSESSMENT AND PLAN: Longstanding Persistent Atrial Fibrillation (ICD10:  I48.11) The patient's CHA2DS2-VASc score is 4, indicating a 4.8% annual risk of stroke.    Patient would like to pursue dofetilide admission. Continue Eliquis  5 mg BID, states no missed doses in the last 3 weeks. No recent benadryl  use PharmD has screened medications QTc in SR 417 ms Labs today pending. CrCl 156  mL/min from [facility] on 09/23/23.   Secondary Hypercoagulable State (ICD10:  D68.69) The patient is at significant risk for stroke/thromboembolism based upon his CHA2DS2-VASc Score of 4.  Continue Apixaban  (Eliquis ).  No missed doses.      Patient will present to admissions when a bed is available.   Terra Pac, [name]  [facility] [address] [place], [place] [address] [phone]

## 2024-02-03 NOTE — H&P (Signed)
 Primary Care Physician: Kayla Jeoffrey RAMAN, FNP Primary Cardiologist: Dorn Lesches, MD Electrophysiologist: [name], MD     Referring Physician: Dr. HOLTS Ian [name] is a 53 y.o. male with a history of CAD, chronic systolic CHF, HLD, HTN, PAD, and (longstanding persistent suspected) atrial fibrillation who presents for consultation in the [facility]. Patient is on Eliquis  5 mg BID for a CHADS2VASC score of 3.  On evaluation today, patient is here for Tikosyn admission. He has held Farxiga  for the past 2 days. No new medications since last OV. No benadryl  use. No missed doses of anticoagulant.   Today, he denies symptoms of palpitations, chest pain, shortness of breath, orthopnea, PND, lower extremity edema, dizziness, presyncope, syncope, snoring, daytime somnolence, bleeding, or neurologic sequela. The patient is tolerating medications without difficulties and is otherwise without complaint today.    he has a BMI of There is no height or weight on file to calculate BMI.. There were no vitals filed for this visit.   No current facility-administered medications for this encounter.    Atrial Fibrillation Management history:  Previous antiarrhythmic drugs: none Previous cardioversions: 10/13/23 Previous ablations: none Anticoagulation history: Eliquis    ROS- All systems are reviewed and negative except as per the HPI above.  Physical Exam: There were no vitals taken for this visit.  GEN: Well nourished, well developed in no acute distress NECK: No JVD; No carotid bruits CARDIAC: Irregularly irregular rate and rhythm, no murmurs, rubs, gallops RESPIRATORY:  Clear to auscultation without rales, wheezing or rhonchi  ABDOMEN: Soft, non-tender, non-distended EXTREMITIES:  No edema; No deformity   EKG today demonstrates  Vent. rate 73 BPM PR interval * ms QRS duration 90 ms QT/QTcB 402/442 ms P-R-T axes * 103 36 Atrial  fibrillation Rightward axis Abnormal ECG When compared with ECG of 18-Nov-2023 14:50, No significant change was found Confirmed by Cherrie Sieving [phone]) on 02/03/2024 10:01:05 AM  Echo 12/16/23 demonstrated  1. Left ventricular ejection fraction, by estimation, is 40 to 45%. The  left ventricle has mildly decreased function. The left ventricle  demonstrates global hypokinesis. Left ventricular diastolic parameters are  indeterminate.   2. Right ventricular systolic function is normal. The right ventricular  size is mildly enlarged.   3. The mitral valve is normal in structure. No evidence of mitral valve  regurgitation. No evidence of mitral stenosis. Moderate mitral annular  calcification.   4. The aortic valve is tricuspid. Aortic valve regurgitation is not  visualized. No aortic stenosis is present.   5. The inferior vena cava is normal in size with greater than 50%  respiratory variability, suggesting right atrial pressure of 3 mmHg.   ASSESSMENT & PLAN CHA2DS2-VASc Score = 4  The patient's score is based upon: CHF History: 1 HTN History: 1 Diabetes History: 1 Stroke History: 0 Vascular Disease History: 1 Age Score: 0 Gender Score: 0       ASSESSMENT AND PLAN: Longstanding Persistent Atrial Fibrillation (ICD10:  I48.11) The patient's CHA2DS2-VASc score is 4, indicating a 4.8% annual risk of stroke.    Patient would like to pursue dofetilide admission. Continue Eliquis  5 mg BID, states no missed doses in the last 3 weeks. No recent benadryl  use PharmD has screened medications QTc in SR 417 ms Labs today pending. CrCl 156 mL/min from [facility] on 09/23/23.   Secondary Hypercoagulable State (ICD10:  D68.69) The patient is at significant risk for stroke/thromboembolism based upon his CHA2DS2-VASc Score of  4.  Continue Apixaban  (Eliquis ).  No missed doses.      Patient will present to admissions when a bed is available.  Ian Pac, [name] [facility] [address] [place], [place] [address] [phone]  _________________________________________________________  Admit for high risk drug monitoring: Tikosyn Loading.    Primary EP > Dr. Dr. Cindie Admit Labs > K+ 4.6, Mg+ 1.9, Cr 0.88 / Cr Cl 162 mL/min EKG Review > QTc 442 ms in AF on 02/03/24. EKG 3/325 in NSR QTc 417 ms  Anticipated Tikosyn Dose > 500 mcg  Anticoagulation > Eliquis  5mg  BID for CHA2DS2-VASc 4    Ian Barrack, NP-C, AGACNP-BC [facility] - Electrophysiology  02/03/2024, 2:56 PM

## 2024-02-03 NOTE — Progress Notes (Signed)
 Pharmacy: Dofetilide (Tikosyn) - Initial Consult Assessment and Electrolyte Replacement  Pharmacy consulted to assist in monitoring and replacing electrolytes in this 53 y.o. male admitted on 02/03/2024 undergoing dofetilide initiation. First dofetilide dose: 500mcg at 20:00  Assessment:  Patient Exclusion Criteria: If any screening criteria checked as Yes, then  patient  should NOT receive dofetilide until criteria item is corrected.  If "Yes" please indicate correction plan.  YES  NO Patient  Exclusion Criteria Correction Plan/Comments   []   [x]   Baseline QTc interval is greater than or equal to 440 msec. IF above YES box checked dofetilide contraindicated unless patient has ICD; then may proceed if QTc 500-550 msec or with known ventricular conduction abnormalities may proceed with QTc 550-600 msec. QTc = 417     []   [x]   Patient is known or suspected to have a digoxin level greater than 2 ng/ml: No results found for: DIGOXIN     []   [x]   Creatinine clearance less than 20 ml/min (calculated using Cockcroft-Gault, actual body weight and serum creatinine): Estimated Creatinine Clearance: 122.9 mL/min (by C-G formula based on SCr of 0.88 mg/dL).     []   [x]  Patient has received drugs known to prolong the QT intervals within the last 48 hour (examples: phenothiazines, tricyclics or tetracyclic antidepressants, macrolides, 1st generation H-1 antihistamines (especially diphenhydramine ), fluoroquinolones, azoles, ondansetron , metoclopramide, promethazine).   Updated information on QT prolonging agents is available to be searched on the following database:QT prolonging agents -If SSRI or antihistamine needed, preferred options are sertraline and loratadine respectively     []   [x]  Patient received a dose of a thiazide diuretic in the last 48 hours [including hydrochlorothiazide (Oretic) alone or in any combination including triamterene (Dyazide, Maxzide)].    []   [x]  Patient  received a medication known to increase dofetilide plasma concentrations prior to initial dofetilide dose:  Trimethoprim  (Primsol, Proloprim ) in the last 36 hours Verapamil  (Calan , Verelan ) in the last 36 hours or a sustained release dose in the last 72 hours Megestrol (Megace) in the last 5 days  Cimetidine (Tagamet) in the last 6 hours Ketoconazole (Nizoral) in the last 24 hours Itraconazole (Sporanox) in the last 48 hours  Prochlorperazine (Compazine) in the last 36 hours     []   [x]   Patient is known to have a history of torsades de pointes; congenital or acquired long QT syndromes.    []   [x]   Patient has received a Class 1 and Class 3 antiarrhythmic with less than 2 half-lives since last dose. (Disopyramide, Quinidine, Procainamide, Lidocaine , Mexiletine, Flecainide, Propafenone, Sotalol, Dronedarone)    []   [x]   Patient has received amiodarone therapy in the past 3 months or amiodarone level is greater than 0.3 ng/ml.    Labs:    Component Value Date/Time   K 4.6 02/03/2024 0915   MG 1.9 02/03/2024 0915     Plan: Select One Calculated CrCl  Dose q12h  [x]  > 60 ml/min 500 mcg  []  40-60 ml/min 250 mcg  []  20-40 ml/min 125 mcg   [x]   Physician selected initial dose within range recommended for patients level of renal function - will monitor for response.  []   Physician selected initial dose outside of range recommended for patients level of renal function - will discuss if the dose should be altered at this time.   Patient has been appropriately anticoagulated with Eliquis .  Potassium: K >/= 4: Appropriate to initiate Tikosyn, no replacement needed    Magnesium : Mg 1.8-2: Give Mg  2 gm IV x1 to prevent Mg from dropping below 1.8 - do not need to recheck Mg. Appropriate to initiate Tikosyn   Thank you for allowing pharmacy to participate in this patient's care   Rocky Slade, PharmD, BCPS 02/03/2024  3:18 PM

## 2024-02-04 ENCOUNTER — Encounter (HOSPITAL_COMMUNITY): Payer: Self-pay | Admitting: Certified Registered"

## 2024-02-04 LAB — MAGNESIUM: Magnesium: 2.2 mg/dL (ref 1.7–2.4)

## 2024-02-04 LAB — BASIC METABOLIC PANEL WITH GFR
Anion gap: 9 (ref 5–15)
BUN: 15 mg/dL (ref 6–20)
CO2: 27 mmol/L (ref 22–32)
Calcium: 9.3 mg/dL (ref 8.9–10.3)
Chloride: 103 mmol/L (ref 98–111)
Creatinine, Ser: 0.89 mg/dL (ref 0.61–1.24)
GFR, Estimated: >60 mL/min (ref >60)
Glucose, Bld: 91 mg/dL (ref 70–99)
Potassium: 4.3 mmol/L (ref 3.5–5.1)
Sodium: 139 mmol/L (ref 135–145)

## 2024-02-04 MED ORDER — SODIUM CHLORIDE 0.9 % IV SOLN: INTRAVENOUS | Status: AC

## 2024-02-04 MED ORDER — DOFETILIDE 250 MCG PO CAPS
250.0000 ug | ORAL_CAPSULE | Freq: Two times a day (BID) | ORAL | Status: DC
  Administered 2024-02-04 – 2024-02-06 (×4): 250 ug via ORAL
  Filled 2024-02-04 (×4): qty 1

## 2024-02-04 NOTE — Progress Notes (Signed)
   Electrophysiology Rounding Note  Patient Name: Ian [name] Date of Encounter: 02/04/2024  Primary Cardiologist: Dorn Lesches, MD  Electrophysiologist: [name], MD    Subjective   Pt remains in afib on Tikosyn 500 mcg BID   QTc from EKG last pm shows stable QTc at 467 ms  The patient is doing well today.  At this time, the patient denies chest pain, shortness of breath, or any new concerns.  Inpatient Medications    Scheduled Meds:  apixaban   5 mg Oral BID   clopidogrel   75 mg Oral Daily   dofetilide  500 mcg Oral BID   furosemide   40 mg Oral Daily   gabapentin   300 mg Oral QHS   glipiZIDE   10 mg Oral BID AC   metFORMIN   1,000 mg Oral BID WC   metoprolol  succinate  100 mg Oral Daily   rosuvastatin   20 mg Oral Daily   sacubitril -valsartan   1 tablet Oral BID   sodium chloride  flush  3 mL Intravenous Q12H   spironolactone   25 mg Oral Daily   Continuous Infusions:  sodium chloride      PRN Meds: sodium chloride , sodium chloride  flush   Vital Signs    Vitals:   02/03/24 1500 02/03/24 1507 02/03/24 1948 02/04/24 0451  BP: 118/74  104/79 132/89  Pulse: 72  66 87  Resp: 17  18 18   Temp: 97.7 F (36.5 C)  97.7 F (36.5 C) 97.8 F (36.6 C)  TempSrc: Oral  Oral Oral  SpO2:   96% 97%  Weight:  119.6 kg    Height:  5' 10 (1.778 m)      Intake/Output Summary (Last 24 hours) at 02/04/2024 9277 Last data filed at 02/03/2024 2200 Gross per 24 hour  Intake 150 ml  Output --  Net 150 ml   Filed Weights   02/03/24 1507  Weight: 119.6 kg    Physical Exam    GEN- NAD, A&O x 3. Normal affect.  Lungs- CTAB, Normal effort.  Heart- Irregularly irregular rate and rhythm. No M/G/R GI- Soft, NT, ND Extremities- No clubbing, cyanosis, or edema Skin- no rash or lesion  Labs    CBC No results for input(s): WBC, NEUTROABS, HGB, HCT, MCV, PLT in the last 72 hours. Basic Metabolic Panel Recent Labs    93/75/74 1806 02/04/24 0452  NA 139  139  K 4.0 4.3  CL 105 103  CO2 24 27  GLUCOSE 172* 91  BUN 14 15  CREATININE 0.87 0.89  CALCIUM  9.4 9.3  MG 2.1 2.2    Telemetry    AF with controlled ventricular response > 50's-80's (personally reviewed)  Patient Profile     Ian [name] is a 53 y.o. male with a past medical history significant for persistent atrial fibrillation.  They were admitted for tikosyn load.   Assessment & Plan    Persistent Atrial Fibrillation High Risk Medication Monitoring: Tikosyn Loading Pt remains in afib on Tikosyn 500 mcg BID  Continue Eliquis  Creatinine, ser  0.89 (06/25 0452) Magnesium   2.2 (06/25 0452) Potassium4.3 (06/25 0452) No electrolyte supplementation needed  If pt does not convert chemically, plan on DCCV Thursday   Secondary Hypercoagulable State  -continue Eliquis    For questions or updates, please contact [facility] Please consult [email or web address] for contact info under Cardiology/STEMI.  Signed, Daphne Barrack, NP-C, AGACNP-BC [facility] - Electrophysiology  02/04/2024, 7:22 AM

## 2024-02-04 NOTE — Progress Notes (Signed)
 Pharmacy: Dofetilide (Tikosyn) - Follow Up Assessment and Electrolyte Replacement  Pharmacy consulted to assist in monitoring and replacing electrolytes in this 53 y.o. male admitted on 02/03/2024 undergoing dofetilide initiation. First dofetilide dose: 6/24  Labs:    Component Value Date/Time   K 4.3 02/04/2024 0452   MG 2.2 02/04/2024 0452     Plan: Potassium: K >/= 4: No additional supplementation needed  Magnesium : Mg > 2: No additional supplementation needed     Thank you for allowing pharmacy to participate in this patient's care   Jinnie Door, PharmD, BCPS, BCCP Clinical Pharmacist  Please check AMION for all [facility] Pharmacy phone numbers After 10:00 PM, call Main Pharmacy [phone]

## 2024-02-04 NOTE — Plan of Care (Signed)

## 2024-02-04 NOTE — Progress Notes (Signed)
 Noted on tele to have converted to NSR at 1230. Will follow. Continue NPO after MN for now. If remains in NSR in am, will cancel DCCV.    Daphne Barrack, NP-C, AGACNP-BC [facility] - Electrophysiology  02/04/2024, 1:57 PM

## 2024-02-04 NOTE — Progress Notes (Signed)
 Morning EKG reviewed     Shows pt remains in afib with prolonged QTc at 502 ms. Reviewed with Dr. Cindie.   Reduce dose to Tikosyn 250 mcg BID.   Potassium4.3 (06/25 0452) Magnesium   2.2 (06/25 0452) Creatinine, ser  0.89 (06/25 0452)  Pt will be NPO after midnight for DCCV if remains in afib.  DCCV orders placed.    Daphne Barrack, NP-C, AGACNP-BC [facility] - Electrophysiology  02/04/2024, 1:10 PM

## 2024-02-04 NOTE — TOC CM/SW Note (Signed)
 Transition of Care [facility]) - Inpatient Brief Assessment   Patient Details  Name: Ian [name] MRN: [id number] Date of Birth: Oct 30, 1970  [facility] CM/SW Contact:    Sudie Erminio Deems, RN Phone Number: 02/04/2024, 3:55 PM   Clinical Narrative: Patient presented for Tikosyn Load. Case Manager spoke with the patient regarding co pay cost. Patient is agreeable to cost and would like to have the initial Rx filled via [facility] and the Rx refills 90 day supply escribed to [facility]. No further needs identified at this time.    Transition of Care Asessment: Insurance and Status: Insurance coverage has been reviewed Patient has primary care physician: Yes Home environment has been reviewed: reviewed Prior level of function:: independent Prior/Current Home Services: No current home services Social Drivers of Health Review: SDOH reviewed no interventions necessary Readmission risk has been reviewed: Yes Transition of care needs: no transition of care needs at this time

## 2024-02-05 ENCOUNTER — Encounter (HOSPITAL_COMMUNITY)
Admission: RE | Disposition: A | Discharge status: Home / Self Care | Payer: Self-pay | Source: Ambulatory Visit | Attending: Cardiology

## 2024-02-05 ENCOUNTER — Other Ambulatory Visit (HOSPITAL_COMMUNITY): Payer: Self-pay

## 2024-02-05 LAB — BASIC METABOLIC PANEL WITH GFR
Anion gap: 10 (ref 5–15)
BUN: 16 mg/dL (ref 6–20)
CO2: 24 mmol/L (ref 22–32)
Calcium: 9.3 mg/dL (ref 8.9–10.3)
Chloride: 103 mmol/L (ref 98–111)
Creatinine, Ser: 0.78 mg/dL (ref 0.61–1.24)
GFR, Estimated: >60 mL/min (ref >60)
Glucose, Bld: 180 mg/dL — ABNORMAL HIGH (ref 70–99)
Potassium: 3.5 mmol/L (ref 3.5–5.1)
Sodium: 137 mmol/L (ref 135–145)

## 2024-02-05 LAB — MAGNESIUM: Magnesium: 2.1 mg/dL (ref 1.7–2.4)

## 2024-02-05 SURGERY — CARDIOVERSION (CATH LAB)
Anesthesia: General

## 2024-02-05 MED ORDER — POTASSIUM CHLORIDE CRYS ER 20 MEQ PO TBCR
60.0000 meq | EXTENDED_RELEASE_TABLET | Freq: Once | ORAL | Status: AC
  Administered 2024-02-05: 60 meq via ORAL
  Filled 2024-02-05: qty 3

## 2024-02-05 NOTE — Progress Notes (Signed)
   Electrophysiology Rounding Note  Patient Name: Ian [name] Date of Encounter: 02/05/2024  Primary Cardiologist: Dorn Lesches, MD  Electrophysiologist: [name], MD    Subjective   Pt converted to sinus rhythm on Tikosyn 500 mcg BID   QTc from EKG last pm shows stable QTc at 449 ms  The patient is doing well today.  At this time, the patient denies chest pain, shortness of breath, or any new concerns.  Inpatient Medications    Scheduled Meds:  apixaban   5 mg Oral BID   clopidogrel   75 mg Oral Daily   dofetilide  250 mcg Oral BID   furosemide   40 mg Oral Daily   gabapentin   300 mg Oral QHS   glipiZIDE   10 mg Oral BID AC   metFORMIN   1,000 mg Oral BID WC   metoprolol  succinate  100 mg Oral Daily   rosuvastatin   20 mg Oral Daily   sacubitril -valsartan   1 tablet Oral BID   sodium chloride  flush  3 mL Intravenous Q12H   spironolactone   25 mg Oral Daily   Continuous Infusions:  sodium chloride      PRN Meds: sodium chloride  flush   Vital Signs    Vitals:   02/04/24 1208 02/04/24 1614 02/04/24 2309 02/05/24 0500  BP: 125/82 137/75 124/76 (!) 106/58  Pulse: 77 79 (!) 47 (!) 47  Resp: 18 17 18 18   Temp: 97.6 F (36.4 C) (!) 97.5 F (36.4 C) 98.4 F (36.9 C) 97.6 F (36.4 C)  TempSrc: Oral Oral Oral Oral  SpO2:   95% 94%  Weight:      Height:        Intake/Output Summary (Last 24 hours) at 02/05/2024 0642 Last data filed at 02/04/2024 2019 Gross per 24 hour  Intake 790 ml  Output --  Net 790 ml   Filed Weights   02/03/24 1507  Weight: 119.6 kg    Physical Exam    GEN- NAD, A&O x 3. Normal affect.  Lungs- CTAB, Normal effort.  Heart- Regular rate and rhythm. No M/G/R GI- Soft, NT, ND Extremities- No clubbing, cyanosis, or edema Skin- no rash or lesion  Labs    CBC No results for input(s): WBC, NEUTROABS, HGB, HCT, MCV, PLT in the last 72 hours.  Basic Metabolic Panel Recent Labs    93/74/74 0452 02/05/24 0423  NA  139 137  K 4.3 3.5  CL 103 103  CO2 27 24  GLUCOSE 91 180*  BUN 15 16  CREATININE 0.89 0.78  CALCIUM  9.3 9.3  MG 2.2 2.1    Telemetry    SB 40's-50's > converted to NSR ~ noon on 6/25 (personally reviewed)  Patient Profile     Ian [name] is a 53 y.o. male with a past medical history significant for persistent atrial fibrillation.  They were admitted for tikosyn load.   Assessment & Plan    Persistent atrial fibrillation Pt converted to sinus rhythm on Tikosyn 500 mcg BID  Continue Eliquis  Creatinine, ser  0.78 (06/26 0423) Magnesium   2.1 (06/26 0423) Potassium3.5 (06/26 0423) Supplement K  Plan for home Friday if QTc remains stable.  Will not require DCCV.    For questions or updates, please contact [facility] Please consult [email or web address] for contact info under Cardiology/STEMI.  Signed, Daphne Barrack, NP-C, AGACNP-BC [facility] - Electrophysiology  02/05/2024, 6:43 AM

## 2024-02-05 NOTE — Progress Notes (Signed)
 Pharmacy: Dofetilide (Tikosyn) - Follow Up Assessment and Electrolyte Replacement  Pharmacy consulted to assist in monitoring and replacing electrolytes in this 53 y.o. male admitted on 02/03/2024 undergoing dofetilide initiation. First dofetilide dose: 6/24  Labs:    Component Value Date/Time   K 3.5 02/05/2024 0423   MG 2.1 02/05/2024 0423     Plan: Potassium: K 3.5-3.7:  Give KCl 60 mEq po x1   Magnesium : Mg > 2: No additional supplementation needed   Thank you for allowing pharmacy to participate in this patient's care   Jinnie Door, PharmD, BCPS, BCCP Clinical Pharmacist  Please check AMION for all [facility] Pharmacy phone numbers After 10:00 PM, call Main Pharmacy [phone]

## 2024-02-05 NOTE — Progress Notes (Signed)
 Morning EKG reviewed     Shows remains in NSR with stable QTc at 453 ms.  Continue  Tikosyn 500 mcg BID.   Potassium3.5 (06/26 0423) Magnesium   2.1 (06/26 0423) Creatinine, ser  0.78 (06/26 0423)  Plan for home Friday if QTc remains stable     Daphne Barrack, NP-C, AGACNP-BC [facility] - Electrophysiology  02/05/2024, 10:52 AM

## 2024-02-06 ENCOUNTER — Other Ambulatory Visit (HOSPITAL_COMMUNITY): Payer: Self-pay

## 2024-02-06 LAB — BASIC METABOLIC PANEL WITH GFR
Anion gap: 10 (ref 5–15)
BUN: 15 mg/dL (ref 6–20)
CO2: 26 mmol/L (ref 22–32)
Calcium: 9.7 mg/dL (ref 8.9–10.3)
Chloride: 102 mmol/L (ref 98–111)
Creatinine, Ser: 0.89 mg/dL (ref 0.61–1.24)
GFR, Estimated: >60 mL/min (ref >60)
Glucose, Bld: 111 mg/dL — ABNORMAL HIGH (ref 70–99)
Potassium: 4 mmol/L (ref 3.5–5.1)
Sodium: 138 mmol/L (ref 135–145)

## 2024-02-06 LAB — MAGNESIUM: Magnesium: 1.8 mg/dL (ref 1.7–2.4)

## 2024-02-06 MED ORDER — DOFETILIDE 250 MCG PO CAPS
250.0000 ug | ORAL_CAPSULE | Freq: Two times a day (BID) | ORAL | 6 refills | Status: DC
  Filled 2024-02-06: qty 60, 30d supply, fill #0

## 2024-02-06 NOTE — Plan of Care (Signed)
  Problem: Education: Goal: Knowledge of disease or condition will improve Outcome: Completed/Met Goal: Understanding of medication regimen will improve Outcome: Completed/Met Goal: Individualized Educational Video(s) Outcome: Completed/Met   Problem: Activity: Goal: Ability to tolerate increased activity will improve Outcome: Completed/Met   Problem: Cardiac: Goal: Ability to achieve and maintain adequate cardiopulmonary perfusion will improve Outcome: Completed/Met   Problem: Health Behavior/Discharge Planning: Goal: Ability to safely manage health-related needs after discharge will improve Outcome: Completed/Met

## 2024-02-06 NOTE — Discharge Instructions (Signed)
 Dofetilide Capsules What is this medication? DOFETILIDE (doe FET il ide) treats a fast or irregular heartbeat (arrhythmia). It works by slowing down overactive electric signals in the heart, which stabilizes your heart rhythm. It belongs to a group of medications called antiarrhythmics. This medicine may be used for other purposes; ask your health care provider or pharmacist if you have questions. COMMON BRAND NAME(S): Tikosyn What should I tell my care team before I take this medication? They need to know if you have any of these conditions: Heart disease History of irregular heartbeat History of low levels of potassium or magnesium in the blood Kidney disease Liver disease An unusual or allergic reaction to dofetilide, other medications, foods, dyes, or preservatives Pregnant or trying to get pregnant Breast-feeding How should I use this medication? Take this medication by mouth with a glass of water. Follow the directions on the prescription label. Do not take with grapefruit juice. You can take it with or without food. If it upsets your stomach, take it with food. Take your medication at regular intervals. Do not take it more often than directed. Do not stop taking except on your care team's advice. A special MedGuide will be given to you by the pharmacist with each prescription and refill. Be sure to read this information carefully each time. Talk to your care team about the use of this medication in children. Special care may be needed. Overdosage: If you think you have taken too much of this medicine contact a poison control center or emergency room at once. NOTE: This medicine is only for you. Do not share this medicine with others. What if I miss a dose? If you miss a dose, skip it. Take your next dose at the normal time. Do not take extra or 2 doses at the same time to make up for the missed dose. What may interact with this medication? Do not take this medication with any of the  following: Benadryl (Diphenhydramine) Cimetidine Cisapride Dolutegravir Dronedarone Erdafitinib Hydrochlorothiazide Immodium Ketoconazole Megestrol Pimozide Prochlorperazine Thioridazine Trimethoprim Verapamil This medication may also interact with the following: Amiloride Cannabinoids Certain antibiotics like erythromycin or clarithromycin Certain antiviral medications for HIV or hepatitis Certain medications for depression, anxiety, or psychotic disorders Digoxin Diltiazem Grapefruit juice Metformin Nefazodone Other medications that prolong the QT interval (an abnormal heart rhythm) Quinine Triamterene Zafirlukast Ziprasidone This list may not describe all possible interactions. Give your health care provider a list of all the medicines, herbs, non-prescription drugs, or dietary supplements you use. Also tell them if you smoke, drink alcohol, or use illegal drugs. Some items may interact with your medicine. What should I watch for while using this medication? Your condition will be monitored carefully while you are receiving this medication. What side effects may I notice from receiving this medication? Side effects that you should report to your care team as soon as possible: Allergic reactions--skin rash, itching, hives, swelling of the face, lips, tongue, or throat Chest pain Heart rhythm changes--fast or irregular heartbeat, dizziness, feeling faint or lightheaded, chest pain, trouble breathing Side effects that usually do not require medical attention (report to your care team if they continue or are bothersome): Dizziness Headache Nausea Stomach pain Trouble sleeping This list may not describe all possible side effects. Call your doctor for medical advice about side effects. You may report side effects to FDA at 1-800-FDA-1088. Where should I keep my medication? Keep out of the reach of children. Store at room temperature between 15 and 30 degrees  C (59 and 86  degrees F). Throw away any unused medication after the expiration date. NOTE: This sheet is a summary. It may not cover all possible information. If you have questions about this medicine, talk to your doctor, pharmacist, or health care provider.  2024 Elsevier/Gold Standard (2021-06-29 00:00:00)

## 2024-02-06 NOTE — Discharge Summary (Signed)
 ELECTROPHYSIOLOGY DISCHARGE SUMMARY    Patient ID: Ian [name],  MRN: [id number], DOB/AGE: 55-Dec-1972 55 y.o.  Admit date: 02/03/2024 Discharge date: 02/06/2024  Primary Care Physician: Kayla Jeoffrey RAMAN, FNP  Primary Cardiologist: Dorn Lesches, MD  Electrophysiologist: Dr. Cindie   Primary Discharge Diagnosis:  1.  Persistent atrial fibrillation status post Tikosyn  loading this admission  Secondary Discharge Diagnosis:  Secondary Hypercoagulable State  Allergies  Allergen Reactions   Atorvastatin  Other (See Comments)    Pt reports causes myalgias. Tolerating rosuvastatin  low dose     Procedures This Admission:  1.  Tikosyn  loading   Brief HPI: Ian [name] is a 53 y.o. male with a past medical history as noted above.  They were referred to EP for treatment options of atrial fibrillation.  Risks, benefits, and alternatives to Tikosyn  were reviewed with the patient who wished to proceed with admission for loading.  Hospital Course:  The patient was admitted and Tikosyn  was initiated.  Renal function and electrolytes were followed during the hospitalization.  Their QTc remained stable. The patient converted chemically and did not require cardioversion. The patients QTc remained stable. They were monitored on telemetry up to discharge. On the day of discharge, they were examined by Dr. Cindie  who considered them stable for discharge to home.  Follow-up has been arranged with the Atrial Fibrillation clinic in approximately 1 week.   Physical Exam: Vitals:   02/05/24 2319 02/06/24 0405 02/06/24 0736 02/06/24 1201  BP: 112/66 109/63 113/70 (!) 143/99  Pulse: (!) 55 (!) 54    Resp: 20 15 16 17   Temp: 98.3 F (36.8 C) 97.7 F (36.5 C) 97.7 F (36.5 C) 97.7 F (36.5 C)  TempSrc: Oral Oral Oral Oral  SpO2: 100% 98%    Weight:      Height:        GEN- NAD, A&O x 3. Normal affect.  Lungs- CTAB, Normal effort.  Heart- Regular rate and rhythm. No M/G/R GI- Soft,  NT, ND Extremities- No clubbing, cyanosis, or edema Skin- no rash or lesion  Labs:   Lab Results  Component Value Date   WBC 9.6 09/23/2023   HGB 18.2 (H) 09/23/2023   HCT 52.9 (H) 09/23/2023   MCV 97.2 09/23/2023   PLT 173 09/23/2023    Recent Labs  Lab 02/06/24 0506  NA 138  K 4.0  CL 102  CO2 26  BUN 15  CREATININE 0.89  CALCIUM  9.7  GLUCOSE 111*    Discharge Medications:  Allergies as of 02/06/2024       Reactions   Atorvastatin  Other (See Comments)   Pt reports causes myalgias. Tolerating rosuvastatin  low dose        Medication List     TAKE these medications    albuterol  108 (90 Base) MCG/ACT inhaler Commonly known as: VENTOLIN  HFA Inhale 1-2 puffs into the lungs every 6 (six) hours as needed for wheezing or shortness of breath.   apixaban  5 MG Tabs tablet Commonly known as: Eliquis  Take 1 tablet (5 mg total) by mouth 2 (two) times daily.   Blood Glucose Monitoring Suppl Devi 1 each by Does not apply route in the morning, at noon, and at bedtime. May substitute to any manufacturer covered by patient's insurance.   CINNAMON PO Take 1,000 mg by mouth 2 (two) times daily.   clopidogrel  75 MG tablet Commonly known as: PLAVIX  Take 1 tablet (75 mg total) by mouth daily.   dapagliflozin  propanediol 5 MG Tabs tablet  Commonly known as: Farxiga  Take 1 tablet (5 mg total) by mouth daily.   dofetilide  250 MCG capsule Commonly known as: TIKOSYN  Take 1 capsule (250 mcg total) by mouth 2 (two) times daily.   furosemide  40 MG tablet Commonly known as: LASIX  Take 40 mg by mouth daily.   gabapentin  100 MG capsule Commonly known as: NEURONTIN  START WITH 1 CAPSULE AND INCREASE TO 3 CAPSULES BY MOUTH NIGHTLY What changed:  how much to take how to take this when to take this additional instructions   glipiZIDE  10 MG tablet Commonly known as: GLUCOTROL  TAKE 1 TABLET BY MOUTH TWICE DAILY BEFORE A MEAL   metFORMIN  1000 MG tablet Commonly known as:  GLUCOPHAGE  Take 1 tablet (1,000 mg total) by mouth 2 (two) times daily with a meal.   metoprolol  succinate 100 MG 24 hr tablet Commonly known as: TOPROL -XL Take 1 tablet (100 mg total) by mouth daily. Take with or immediately following a meal.   nitroGLYCERIN  0.4 MG SL tablet Commonly known as: NITROSTAT  Place 1 tablet under the tongue every 5 (five) minutes as needed for chest pain.   Omega 3 1000 MG Caps Take 1,000 mg by mouth 2 (two) times daily.   rosuvastatin  20 MG tablet Commonly known as: CRESTOR  Take 1 tablet (20 mg total) by mouth daily.   sacubitril -valsartan  24-26 MG Commonly known as: Entresto  Take 1 tablet by mouth 2 (two) times daily. START THIS ON THURSDAY 2/13   sildenafil  50 MG tablet Commonly known as: VIAGRA  Take 1 tablet (50 mg total) by mouth daily as needed for erectile dysfunction. What changed: when to take this   spironolactone  25 MG tablet Commonly known as: ALDACTONE  Take 1 tablet (25 mg total) by mouth daily.   triamcinolone cream 0.1 % Commonly known as: KENALOG Apply 1 Application topically daily as needed (rash).        Disposition:  Home with follow up in AF clinic in 1 week as in AVS.   Duration of Discharge Encounter:  APP time: 33 minutes  Signed, Daphne Barrack, NP-C, AGACNP-BC [facility] - Electrophysiology  02/06/2024, 2:13 PM

## 2024-02-06 NOTE — Progress Notes (Signed)
 EKG from yesterday evening 02/05/2024 reviewed     Shows remains in NSR with stable QTc at 453 ms.  Continue  Tikosyn  500 mcg BID.   Potassium3.5 (06/26 0423) Magnesium   2.1 (06/26 0423) Creatinine, ser  0.78 (06/26 0423)  Plan for home Friday if QTc remains stable   Daphne Barrack, NP-C, AGACNP-BC [name] - Electrophysiology  02/06/2024, 6:35 AM

## 2024-02-06 NOTE — Progress Notes (Signed)
Nurse Notified.

## 2024-02-10 ENCOUNTER — Ambulatory Visit (HOSPITAL_COMMUNITY): Admitting: Internal Medicine

## 2024-02-11 ENCOUNTER — Ambulatory Visit (HOSPITAL_COMMUNITY): Admitting: Internal Medicine

## 2024-02-11 ENCOUNTER — Ambulatory Visit (HOSPITAL_COMMUNITY)
Admission: RE | Admit: 2024-02-11 | Discharge: 2024-02-11 | Disposition: A | Discharge status: Home / Self Care | Source: Ambulatory Visit | Attending: Internal Medicine | Admitting: Internal Medicine

## 2024-02-11 ENCOUNTER — Encounter (HOSPITAL_COMMUNITY): Payer: Self-pay | Admitting: Internal Medicine

## 2024-02-11 VITALS — BP 132/92 | HR 126 | Ht 70.0 in | Wt 260.4 lb

## 2024-02-11 DIAGNOSIS — Z79899 Other long term (current) drug therapy: Secondary | ICD-10-CM | POA: Diagnosis not present

## 2024-02-11 DIAGNOSIS — Z5181 Encounter for therapeutic drug level monitoring: Secondary | ICD-10-CM | POA: Insufficient documentation

## 2024-02-11 DIAGNOSIS — D6869 Other thrombophilia: Secondary | ICD-10-CM | POA: Diagnosis not present

## 2024-02-11 DIAGNOSIS — I4811 Longstanding persistent atrial fibrillation: Secondary | ICD-10-CM | POA: Insufficient documentation

## 2024-02-11 MED ORDER — DOFETILIDE 250 MCG PO CAPS: 250.0000 ug | ORAL_CAPSULE | Freq: Two times a day (BID) | ORAL | 6 refills | Status: AC

## 2024-02-11 MED ORDER — METOPROLOL TARTRATE 25 MG PO TABS: 25.0000 mg | ORAL_TABLET | Freq: Two times a day (BID) | ORAL | 11 refills | Status: AC | PRN

## 2024-02-11 NOTE — Progress Notes (Signed)
 Primary Care Physician: Kayla Jeoffrey RAMAN, FNP Primary Cardiologist: Dorn Lesches, MD Electrophysiologist: [name], MD     Referring Physician: Dr. HOLTS Ian [name] is a 53 y.o. male with a history of CAD, chronic systolic CHF, HLD, HTN, PAD, and (longstanding persistent suspected) atrial fibrillation who presents for consultation in the [facility]. Patient is on Eliquis  5 mg BID for a CHADS2VASC score of 3.  On follow up 02/11/24, patient is here for 1 week Tikosyn  surveillance. He is in Afib with RVR and noted it came on last night before his evening dose of Tikosyn . His watch notifies him of heart rate because he does not have cardiac awareness. S/p Tikosyn  admission 6/24-27/2025. Converted chemically to NSR and did not require cardioversion. Qtc remained stable. Patient noted to go into Afib last night. No missed doses of Tikosyn  or Eliquis .   Today, he denies symptoms of palpitations, chest pain, shortness of breath, orthopnea, PND, lower extremity edema, dizziness, presyncope, syncope, snoring, daytime somnolence, bleeding, or neurologic sequela. The patient is tolerating medications without difficulties and is otherwise without complaint today.    he has a BMI of Body mass index is 37.36 kg/m.SABRA Filed Weights   02/11/24 0841  Weight: 118.1 kg    Current Outpatient Medications  Medication Sig Dispense Refill   albuterol  (VENTOLIN  HFA) 108 (90 Base) MCG/ACT inhaler Inhale 1-2 puffs into the lungs every 6 (six) hours as needed for wheezing or shortness of breath. 10 each 6   apixaban  (ELIQUIS ) 5 MG TABS tablet Take 1 tablet (5 mg total) by mouth 2 (two) times daily. 180 tablet 1   Blood Glucose Monitoring Suppl DEVI 1 each by Does not apply route in the morning, at noon, and at bedtime. May substitute to any manufacturer covered by patient's insurance. 1 each 0   CINNAMON PO Take 1,000 mg by mouth 2 (two) times daily.     clopidogrel   (PLAVIX ) 75 MG tablet Take 1 tablet (75 mg total) by mouth daily. 90 tablet 3   dapagliflozin  propanediol (FARXIGA ) 5 MG TABS tablet Take 1 tablet (5 mg total) by mouth daily. 30 tablet 11   dofetilide  (TIKOSYN ) 250 MCG capsule Take 1 capsule (250 mcg total) by mouth 2 (two) times daily. 60 capsule 6   furosemide  (LASIX ) 40 MG tablet Take 40 mg by mouth daily.     gabapentin  (NEURONTIN ) 100 MG capsule START WITH 1 CAPSULE AND INCREASE TO 3 CAPSULES BY MOUTH NIGHTLY 90 capsule 0   glipiZIDE  (GLUCOTROL ) 10 MG tablet TAKE 1 TABLET BY MOUTH TWICE DAILY BEFORE A MEAL 180 tablet 0   metFORMIN  (GLUCOPHAGE ) 1000 MG tablet Take 1 tablet (1,000 mg total) by mouth 2 (two) times daily with a meal. 180 tablet 3   metoprolol  succinate (TOPROL -XL) 100 MG 24 hr tablet Take 1 tablet (100 mg total) by mouth daily. Take with or immediately following a meal. 90 tablet 3   nitroGLYCERIN  (NITROSTAT ) 0.4 MG SL tablet Place 1 tablet under the tongue every 5 (five) minutes as needed for chest pain.     Omega 3 1000 MG CAPS Take 1,000 mg by mouth 2 (two) times daily.     rosuvastatin  (CRESTOR ) 20 MG tablet Take 1 tablet (20 mg total) by mouth daily. 90 tablet 0   sacubitril -valsartan  (ENTRESTO ) 24-26 MG Take 1 tablet by mouth 2 (two) times daily. START THIS ON THURSDAY 2/13 60 tablet 3   sildenafil  (VIAGRA ) 50 MG  tablet Take 1 tablet (50 mg total) by mouth daily as needed for erectile dysfunction. 10 tablet 6   spironolactone  (ALDACTONE ) 25 MG tablet Take 1 tablet (25 mg total) by mouth daily. 30 tablet 3   triamcinolone cream (KENALOG) 0.1 % Apply 1 Application topically daily as needed (rash).     No current facility-administered medications for this encounter.    Atrial Fibrillation Management history:  Previous antiarrhythmic drugs: none Previous cardioversions: 10/13/23 Previous ablations: none Anticoagulation history: Eliquis    ROS- All systems are reviewed and negative except as per the HPI above.  Physical  Exam: BP (!) 132/92   Pulse (!) 126   Ht 5' 10 (1.778 m)   Wt 118.1 kg   BMI 37.36 kg/m   GEN: Well nourished, well developed in no acute distress NECK: No JVD; No carotid bruits CARDIAC: Irregularly irregular tachycardic rate and rhythm, no murmurs, rubs, gallops RESPIRATORY:  Clear to auscultation without rales, wheezing or rhonchi  ABDOMEN: Soft, non-tender, non-distended EXTREMITIES:  No edema; No deformity   EKG today demonstrates  Vent. rate 126 BPM PR interval * ms QRS duration 76 ms QT/QTcB 334/483 ms P-R-T axes * 106 58 Atrial fibrillation with rapid ventricular response Rightward axis Nonspecific ST abnormality Abnormal ECG When compared with ECG of 06-Feb-2024 10:35, Atrial fibrillation has replaced Sinus rhythm   Echo 12/16/23 demonstrated  1. Left ventricular ejection fraction, by estimation, is 40 to 45%. The  left ventricle has mildly decreased function. The left ventricle  demonstrates global hypokinesis. Left ventricular diastolic parameters are  indeterminate.   2. Right ventricular systolic function is normal. The right ventricular  size is mildly enlarged.   3. The mitral valve is normal in structure. No evidence of mitral valve  regurgitation. No evidence of mitral stenosis. Moderate mitral annular  calcification.   4. The aortic valve is tricuspid. Aortic valve regurgitation is not  visualized. No aortic stenosis is present.   5. The inferior vena cava is normal in size with greater than 50%  respiratory variability, suggesting right atrial pressure of 3 mmHg.   ASSESSMENT & PLAN CHA2DS2-VASc Score = 4  The patient's score is based upon: CHF History: 1 HTN History: 1 Diabetes History: 1 Stroke History: 0 Vascular Disease History: 1 Age Score: 0 Gender Score: 0       ASSESSMENT AND PLAN: Longstanding Persistent Atrial Fibrillation (ICD10:  I48.11) The patient's CHA2DS2-VASc score is 4, indicating a 4.8% annual risk of stroke.   S/p  Tikosyn  admission 6/24-27/2025  He is currently in Afib with RVR. Tikosyn  teaching revisited. He sets an alarm for q 12 hr to not miss doses. Reassurance given. Will provide Lopressor  25 mg PRN. Advised patient to call and update clinic next week; if remains persistent will recommend cardioversion.  High risk medication monitoring (ICD10: U5195107) Patient requires ongoing monitoring for anti-arrhythmic medication which has the potential to cause life threatening arrhythmias or AV block. Qtc stable. Continue Tikosyn  250 mcg BID. Bmet, mag, and CBC drawn today.  Secondary Hypercoagulable State (ICD10:  D68.69) The patient is at significant risk for stroke/thromboembolism based upon his CHA2DS2-VASc Score of 4.  Continue Apixaban  (Eliquis ).  No missed doses.    Follow up with Jodie Passey, [name], as scheduled.   Terra Pac, [name]  [facility] [address] [place], [place] [address] [phone]

## 2024-02-11 NOTE — Addendum Note (Signed)
 Encounter addended by: Franchot Glade RAMAN, RN on: 02/11/2024 10:01 AM  Actions taken: Pharmacy for encounter modified, Order list changed

## 2024-02-12 ENCOUNTER — Other Ambulatory Visit (HOSPITAL_COMMUNITY): Payer: Self-pay | Admitting: Cardiology

## 2024-02-12 ENCOUNTER — Other Ambulatory Visit: Payer: Self-pay | Admitting: Family Medicine

## 2024-02-12 DIAGNOSIS — R202 Paresthesia of skin: Secondary | ICD-10-CM

## 2024-02-12 DIAGNOSIS — M542 Cervicalgia: Secondary | ICD-10-CM

## 2024-02-12 LAB — CBC
Hematocrit: 51.1 % — ABNORMAL HIGH (ref 37.5–51.0)
Hemoglobin: 16.5 g/dL (ref 13.0–17.7)
MCH: 32.2 pg (ref 26.6–33.0)
MCHC: 32.3 g/dL (ref 31.5–35.7)
MCV: 100 fL — ABNORMAL HIGH (ref 79–97)
Platelets: 167 10*3/uL (ref 150–450)
RBC: 5.12 x10E6/uL (ref 4.14–5.80)
RDW: 11.9 % (ref 11.6–15.4)
WBC: 9.1 10*3/uL (ref 3.4–10.8)

## 2024-02-12 LAB — BASIC METABOLIC PANEL WITH GFR
BUN/Creatinine Ratio: 20 (ref 9–20)
BUN: 23 mg/dL (ref 6–24)
CO2: 23 mmol/L (ref 20–29)
Calcium: 10.5 mg/dL — ABNORMAL HIGH (ref 8.7–10.2)
Chloride: 96 mmol/L (ref 96–106)
Creatinine, Ser: 1.14 mg/dL (ref 0.76–1.27)
Glucose: 191 mg/dL — ABNORMAL HIGH (ref 70–99)
Potassium: 3.9 mmol/L (ref 3.5–5.2)
Sodium: 138 mmol/L (ref 134–144)
eGFR: 76 mL/min/{1.73_m2} (ref >59)

## 2024-02-12 LAB — MAGNESIUM: Magnesium: 2.1 mg/dL (ref 1.6–2.3)

## 2024-02-16 ENCOUNTER — Ambulatory Visit (HOSPITAL_COMMUNITY): Payer: Self-pay | Admitting: Internal Medicine

## 2024-02-17 ENCOUNTER — Telehealth: Payer: Self-pay | Admitting: Pharmacy Technician

## 2024-02-17 ENCOUNTER — Encounter: Payer: Self-pay | Admitting: Family Medicine

## 2024-02-17 ENCOUNTER — Other Ambulatory Visit (HOSPITAL_COMMUNITY): Payer: Self-pay

## 2024-02-17 ENCOUNTER — Ambulatory Visit: Admitting: Family Medicine

## 2024-02-17 VITALS — BP 126/68 | HR 96 | Ht 70.0 in | Wt 260.4 lb

## 2024-02-17 DIAGNOSIS — Z0000 Encounter for general adult medical examination without abnormal findings: Secondary | ICD-10-CM | POA: Diagnosis not present

## 2024-02-17 DIAGNOSIS — I10 Essential (primary) hypertension: Secondary | ICD-10-CM | POA: Diagnosis not present

## 2024-02-17 DIAGNOSIS — J449 Chronic obstructive pulmonary disease, unspecified: Secondary | ICD-10-CM

## 2024-02-17 DIAGNOSIS — I2510 Atherosclerotic heart disease of native coronary artery without angina pectoris: Secondary | ICD-10-CM

## 2024-02-17 DIAGNOSIS — Z1211 Encounter for screening for malignant neoplasm of colon: Secondary | ICD-10-CM

## 2024-02-17 DIAGNOSIS — I480 Paroxysmal atrial fibrillation: Secondary | ICD-10-CM

## 2024-02-17 DIAGNOSIS — M25561 Pain in right knee: Secondary | ICD-10-CM

## 2024-02-17 DIAGNOSIS — I2583 Coronary atherosclerosis due to lipid rich plaque: Secondary | ICD-10-CM

## 2024-02-17 DIAGNOSIS — Z125 Encounter for screening for malignant neoplasm of prostate: Secondary | ICD-10-CM | POA: Diagnosis not present

## 2024-02-17 DIAGNOSIS — Z7984 Long term (current) use of oral hypoglycemic drugs: Secondary | ICD-10-CM

## 2024-02-17 DIAGNOSIS — E782 Mixed hyperlipidemia: Secondary | ICD-10-CM | POA: Diagnosis not present

## 2024-02-17 DIAGNOSIS — F17210 Nicotine dependence, cigarettes, uncomplicated: Secondary | ICD-10-CM

## 2024-02-17 DIAGNOSIS — E118 Type 2 diabetes mellitus with unspecified complications: Secondary | ICD-10-CM | POA: Diagnosis not present

## 2024-02-17 MED ORDER — OZEMPIC (0.25 OR 0.5 MG/DOSE) 2 MG/1.5ML ~~LOC~~ SOPN: PEN_INJECTOR | SUBCUTANEOUS | 0 refills | Status: DC

## 2024-02-17 NOTE — Assessment & Plan Note (Signed)
 Followed by Cardiology and EP, on eliquis  and tikosyn  with PRN metoprolol . Plan for ablation next month.

## 2024-02-17 NOTE — Assessment & Plan Note (Signed)
 RCA stent in 2016 and required restent in 2018. No chest pain. Nonischemic myocardial perfustion study 07/18/2023. Well controlled HTN. Labs today to eval lipids and blood sugar control.

## 2024-02-17 NOTE — Telephone Encounter (Signed)
 Pharmacy Patient Advocate Encounter  Received notification from [facility] that Prior Authorization for Ozempic  (0.25 or 0.5 MG/DOSE) 2MG /3ML pen-injectors has been APPROVED from 02/17/24 to 02/16/25. Ran test claim, Copay is $4.00. This test claim was processed through [facility]- copay amounts may vary at other pharmacies due to pharmacy/plan contracts, or as the patient moves through the different stages of their insurance plan.   PA #/Case ID/Reference #: [id number]

## 2024-02-17 NOTE — Assessment & Plan Note (Signed)
 Well controlled on Metoprolol  and Entresto . Recommend heart healthy diet such as Mediterranean diet with whole grains, fruits, vegetable, fish, lean meats, nuts, and olive oil. Limit salt. Encouraged moderate walking, 3-5 times/week for 30-50 minutes each session. Aim for at least 150 minutes.week. Goal should be pace of 3 miles/hours, or walking 1.5 miles in 30 minutes. Avoid tobacco products. Avoid excess alcohol. Take medications as prescribed and bring medications and blood pressure log with cuff to each office visit. Seek medical care for chest pain, palpitations, shortness of breath with exertion, dizziness/lightheadedness, vision changes, recurrent headaches, or swelling of extremities.

## 2024-02-17 NOTE — Progress Notes (Deleted)
 Complete physical exam  Patient: Ian [name]   DOB: 27-Apr-1971   53 y.o. Male  MRN: [id number]  Subjective:    Chief Complaint  Patient presents with   Annual Exam    Needs foot exam and A1C   Knee Pain    Pt c/o pain to R knee for last couple of months.     Ian [name] is a 53 y.o. male who presents today for a complete physical exam. He reports consuming a general diet. The patient does not participate in regular exercise at present. He generally feels well. He reports sleeping fairly well. He does not have additional problems to discuss today.   DM2: not checking, last A1c >8%, on Metformin , Farxiga , Glipizide   PAF: Eliquis  5mg  BID for CHADS2VASC 4, Tikosyn  250mcg BID, Lopressor  25mg  PRN, ablation planed for   HLD: Rosuvastatin   HTN: Metoprolol  100mg  daily, Entresto  24-26mg  daily, Spironolactone  25mg  daily,   COPD:  Chronic systolic CHF: EF 59-54% on 12/16/23 ECHO, lasix ?, spironolactone   CAD: s/p RCA stent 2016, 2018  OSA on CPAP: non-compliant with CPAP  PAD: s/p stent to left common iliac artery 2021, needs reangio for occlusion of stent     Most recent fall risk assessment:    02/17/2024    8:00 AM  Fall Risk   Falls in the past year? 0  Number falls in past yr: 0  Injury with Fall? 0  Risk for fall due to : No Fall Risks  Follow up Falls evaluation completed     Most recent depression screenings:    02/17/2024    8:00 AM 08/18/2023   10:51 AM  PHQ 2/9 Scores  PHQ - 2 Score 0 0    Vision:Within last year, Dental: No current dental problems and Receives regular dental care, and PSA: Agrees to PSA testing  Patient Active Problem List   Diagnosis Date Noted   Longstanding persistent atrial fibrillation (HCC) 02/03/2024   Chronic obstructive pulmonary disease (HCC) 06/09/2023   Cigarette nicotine  dependence without complication 06/09/2023   Physical exam, annual 06/09/2023   Acute bacterial conjunctivitis of right eye 06/09/2023   Claudication in  peripheral vascular disease (HCC) 03/27/2020   Peripheral arterial disease (HCC) 03/15/2020   Dyspnea on exertion 10/27/2018   Chest pain 04/22/2017   Abnormal nuclear stress test 09/06/2016   S/P PTCA (percutaneous transluminal coronary angioplasty)    PAF (paroxysmal atrial fibrillation) (HCC) 12/19/2015   Essential hypertension 12/19/2015   Coronary artery disease due to lipid rich plaque 12/19/2015   Ischemic cardiomyopathy 12/19/2015   Hyperlipidemia 05/21/2015   Type 2 diabetes mellitus with complication, without long-term current use of insulin  (HCC) 05/21/2015   Tobacco abuse 05/21/2015   Past Medical History:  Diagnosis Date   Arthritis    left ankle (09/04/2016)   Cardiomyopathy (HCC)    a. EF 40-45% by prior echo. .b. 50-55% in 02/2020.   Complication of anesthesia    hard to get me out of it sometimes; usually takes me longer to get under   Coronary artery disease    a. multiple PCIs to RCA.   Essential hypertension 12/19/2015   Hyperlipidemia 05/21/2015   Hypertension    NSTEMI (non-ST elevated myocardial infarction) (HCC) 05/2015   thelbert 05/23/2015 minor one   PAD (peripheral artery disease) (HCC)    PAF (paroxysmal atrial fibrillation) (HCC)    Tobacco abuse 05/21/2015   Type II diabetes mellitus (HCC)    Past Surgical History:  Procedure Laterality Date   ABDOMINAL  AORTOGRAM W/LOWER EXTREMITY Bilateral 03/27/2020   Procedure: ABDOMINAL AORTOGRAM W/LOWER EXTREMITY;  Surgeon: Court Dorn PARAS, MD;  Location: [facility] INVASIVE CV LAB;  Service: Cardiovascular;  Laterality: Bilateral;   CARDIAC CATHETERIZATION  2000, 2005   less than 20% blockage   CARDIAC CATHETERIZATION N/A 05/22/2015   Procedure: Left Heart Cath and Coronary Angiography;  Surgeon: Candyce GORMAN Reek, MD;  Location: [facility] INVASIVE CV LAB;  Service: Cardiovascular;  Laterality: N/A;   CARDIAC CATHETERIZATION N/A 05/22/2015   Procedure: Coronary Stent Intervention;  Surgeon: Candyce GORMAN Reek, MD;   Location: [facility] INVASIVE CV LAB;  Service: Cardiovascular;  Laterality: N/A;   CARDIAC CATHETERIZATION N/A 09/04/2016   Procedure: Left Heart Cath and Coronary Angiography;  Surgeon: Candyce GORMAN Reek, MD;  Location: [facility] INVASIVE CV LAB;  Service: Cardiovascular;  Laterality: N/A;   CARDIAC CATHETERIZATION N/A 09/04/2016   Procedure: Coronary Balloon Angioplasty;  Surgeon: Candyce GORMAN Reek, MD;  Location: [facility] INVASIVE CV LAB;  Service: Cardiovascular;  Laterality: N/A;   CARDIAC CATHETERIZATION N/A 09/05/2016   Procedure: Coronary Atherectomy;  Surgeon: Alm LELON Clay, MD;  Location: [facility] INVASIVE CV LAB;  Service: Cardiovascular;  Laterality: N/A;   CARDIAC CATHETERIZATION N/A 09/05/2016   Procedure: Coronary Stent Intervention;  Surgeon: Alm LELON Clay, MD;  Location: [facility] INVASIVE CV LAB;  Service: Cardiovascular;  Laterality: N/A;   CARDIAC CATHETERIZATION N/A 09/05/2016   Procedure: Temporary Pacemaker;  Surgeon: Alm LELON Clay, MD;  Location: [facility] INVASIVE CV LAB;  Service: Cardiovascular;  Laterality: N/A;   CARDIOVERSION N/A 10/13/2023   Procedure: CARDIOVERSION;  Surgeon: Zenaida Morene PARAS, MD;  Location: [facility] INVASIVE CV LAB;  Service: Cardiovascular;  Laterality: N/A;   CORONARY ANGIOPLASTY     CORONARY PRESSURE/FFR STUDY N/A 10/16/2018   Procedure: INTRAVASCULAR PRESSURE WIRE/FFR STUDY;  Surgeon: Reek Candyce GORMAN, MD;  Location: [facility] INVASIVE CV LAB;  Service: Cardiovascular;  Laterality: N/A;   LEFT HEART CATH AND CORONARY ANGIOGRAPHY N/A 10/16/2018   Procedure: LEFT HEART CATH AND CORONARY ANGIOGRAPHY;  Surgeon: Reek Candyce GORMAN, MD;  Location: [facility] INVASIVE CV LAB;  Service: Cardiovascular;  Laterality: N/A;   MULTIPLE TOOTH EXTRACTIONS  2013   all of them   PERIPHERAL VASCULAR INTERVENTION  03/27/2020   Procedure: PERIPHERAL VASCULAR INTERVENTION;  Surgeon: Court Dorn PARAS, MD;  Location: [facility] INVASIVE CV LAB;  Service: Cardiovascular;;  Left iliac artery    TYMPANOSTOMY TUBE PLACEMENT  Bilateral 1970s   Social History   Tobacco Use   Smoking status: Every Day    Current packs/day: 0.50    Average packs/day: 0.5 packs/day for 35.0 years (17.5 ttl pk-yrs)    Types: Cigarettes   Smokeless tobacco: Never   Tobacco comments:    Currently smoked .5ppd 02/11/24  Vaping Use   Vaping status: Never Used  Substance Use Topics   Alcohol use: Not Currently    Comment: 2025.....socially   Drug use: Not Currently    Types: Cocaine, Marijuana    Comment: 09/04/2016 haven't touched drugs since 1988   Family History  Problem Relation Age of Onset   Diabetes Mother    Heart attack Father    Diabetes Sister    Diabetes Maternal Grandmother    Allergies  Allergen Reactions   Atorvastatin  Other (See Comments)    Pt reports causes myalgias. Tolerating rosuvastatin  low dose      Patient Care Team: Kayla Jeoffrey GORMAN, FNP as PCP - General (Family Medicine) Court Dorn PARAS, MD as PCP - Cardiology (Cardiology) Cindie Ole DASEN, MD as  PCP - Electrophysiology (Cardiology) Croitoru, Jerel, MD as Consulting Physician (Cardiology) Darlean Ozell NOVAK, MD as Consulting Physician (Pulmonary Disease)   Outpatient Medications Prior to Visit  Medication Sig   albuterol  (VENTOLIN  HFA) 108 (90 Base) MCG/ACT inhaler Inhale 1-2 puffs into the lungs every 6 (six) hours as needed for wheezing or shortness of breath.   apixaban  (ELIQUIS ) 5 MG TABS tablet Take 1 tablet (5 mg total) by mouth 2 (two) times daily.   Blood Glucose Monitoring Suppl DEVI 1 each by Does not apply route in the morning, at noon, and at bedtime. May substitute to any manufacturer covered by patient's insurance.   CINNAMON PO Take 1,000 mg by mouth 2 (two) times daily.   clopidogrel  (PLAVIX ) 75 MG tablet Take 1 tablet (75 mg total) by mouth daily.   dapagliflozin  propanediol (FARXIGA ) 5 MG TABS tablet Take 1 tablet (5 mg total) by mouth daily.   dofetilide  (TIKOSYN ) 250 MCG capsule Take 1 capsule (250 mcg total) by mouth  2 (two) times daily.   furosemide  (LASIX ) 40 MG tablet Take 40 mg by mouth daily.   gabapentin  (NEURONTIN ) 100 MG capsule START WITH 1 CAPSULE AND INCREASE TO 3 CAPSULES BY MOUTH NIGHTLY   glipiZIDE  (GLUCOTROL ) 10 MG tablet TAKE 1 TABLET BY MOUTH TWICE DAILY BEFORE A MEAL   metFORMIN  (GLUCOPHAGE ) 1000 MG tablet Take 1 tablet (1,000 mg total) by mouth 2 (two) times daily with a meal.   metoprolol  succinate (TOPROL -XL) 100 MG 24 hr tablet Take 1 tablet (100 mg total) by mouth daily. Take with or immediately following a meal.   metoprolol  tartrate (LOPRESSOR ) 25 MG tablet Take 1 tablet (25 mg total) by mouth 2 (two) times daily as needed (HR >100).   nitroGLYCERIN  (NITROSTAT ) 0.4 MG SL tablet Place 1 tablet under the tongue every 5 (five) minutes as needed for chest pain.   Omega 3 1000 MG CAPS Take 1,000 mg by mouth 2 (two) times daily.   rosuvastatin  (CRESTOR ) 20 MG tablet Take 1 tablet (20 mg total) by mouth daily.   sacubitril -valsartan  (ENTRESTO ) 24-26 MG TAKE 1 TABLET BY MOUTH TWICE DAILY. START THIS ON THURSDAY 2/13   sildenafil  (VIAGRA ) 50 MG tablet Take 1 tablet (50 mg total) by mouth daily as needed for erectile dysfunction.   spironolactone  (ALDACTONE ) 25 MG tablet Take 1 tablet by mouth once daily   triamcinolone cream (KENALOG) 0.1 % Apply 1 Application topically daily as needed (rash).   No facility-administered medications prior to visit.    ROS        Objective:     BP 126/68   Pulse 96   Ht 5' 10 (1.778 m)   Wt 260 lb 6.4 oz (118.1 kg)   SpO2 99%   BMI 37.36 kg/m  BP Readings from Last 3 Encounters:  02/17/24 126/68  02/11/24 (!) 132/92  02/06/24 (!) 143/99   Wt Readings from Last 3 Encounters:  02/17/24 260 lb 6.4 oz (118.1 kg)  02/11/24 260 lb 6.4 oz (118.1 kg)  02/03/24 263 lb 11.2 oz (119.6 kg)      Physical Exam   No results found for any visits on 02/17/24. Last CBC Lab Results  Component Value Date   WBC 9.1 02/11/2024   HGB 16.5 02/11/2024    HCT 51.1 (H) 02/11/2024   MCV 100 (H) 02/11/2024   MCH 32.2 02/11/2024   RDW 11.9 02/11/2024   PLT 167 02/11/2024   Last metabolic panel Lab Results  Component Value Date  GLUCOSE 191 (H) 02/11/2024   NA 138 02/11/2024   K 3.9 02/11/2024   CL 96 02/11/2024   CO2 23 02/11/2024   BUN 23 02/11/2024   CREATININE 1.14 02/11/2024   EGFR 76 02/11/2024   CALCIUM  10.5 (H) 02/11/2024   PROT 6.8 07/18/2023   ALBUMIN 4.1 07/18/2023   LABGLOB 2.2 07/20/2019   AGRATIO 2.0 07/20/2019   BILITOT 1.2 (H) 07/18/2023   ALKPHOS 57 07/18/2023   AST 24 07/18/2023   ALT 20 07/18/2023   ANIONGAP 10 02/06/2024   Last lipids Lab Results  Component Value Date   CHOL 126 06/09/2023   HDL 34 (L) 06/09/2023   LDLCALC 68 06/09/2023   TRIG 164 (H) 06/09/2023   CHOLHDL 3.7 06/09/2023   Last hemoglobin A1c Lab Results  Component Value Date   HGBA1C 8.9 (H) 06/09/2023   Last thyroid functions Lab Results  Component Value Date   TSH 1.130 07/20/2019   Last vitamin D No results found for: MARIEN BOLLS, VD25OH Last vitamin B12 and Folate Lab Results  Component Value Date   VITAMINB12 411 06/09/2023        Assessment & Plan:    Routine Health Maintenance and Physical Exam  Immunization History  Administered Date(s) Administered   Influenza,inj,quad, With Preservative 03/01/2021   Pneumococcal Polysaccharide-23 09/10/2016   Tdap 01/03/2017, 04/19/2019   Unspecified SARS-COV-2 Vaccination 11/06/2019    Health Maintenance  Topic Date Due   FOOT EXAM  Never done   Hepatitis B Vaccines (1 of 3 - 19+ 3-dose series) Never done   Zoster Vaccines- Shingrix (1 of 2) Never done   Colonoscopy  Never done   Pneumococcal Vaccine 56-63 Years old (2 of 2 - PCV) 09/10/2017   COVID-19 Vaccine (2 - Mixed Product risk series) 12/04/2019   HEMOGLOBIN A1C  12/08/2023   INFLUENZA VACCINE  03/12/2024   OPHTHALMOLOGY EXAM  05/04/2024   Diabetic kidney evaluation - Urine ACR   06/08/2024   Diabetic kidney evaluation - eGFR measurement  02/10/2025   DTaP/Tdap/Td (3 - Td or Tdap) 04/18/2029   Hepatitis C Screening  Completed   HIV Screening  Completed   HPV VACCINES  Aged Out   Meningococcal B Vaccine  Aged Out    Discussed health benefits of physical activity, and encouraged him to engage in regular exercise appropriate for his age and condition.  Problem List Items Addressed This Visit   None  No follow-ups on file.     Jeoffrey GORMAN Barrio, FNP

## 2024-02-17 NOTE — Telephone Encounter (Signed)
 Pharmacy Patient Advocate Encounter   Received notification from [employer]eds that prior authorization for Ozempic  (0.25 or 0.5 MG/DOSE) 2MG /3ML pen-injectors is required/requested.   Insurance verification completed.   The patient is insured through [facility] .   Per test claim: PA required; PA submitted to above mentioned insurance via CoverMyMeds Key/confirmation #/EOC [id number] Status is pending

## 2024-02-17 NOTE — Assessment & Plan Note (Signed)
 Labs today. Continue rosuvastatin .Long discussion regarding importance of diet and exercise. I recommend consuming a heart healthy diet such as Mediterranean diet or DASH diet with whole grains, fruits, vegetable, fish, lean meats, nuts, and olive oil. Limit sweets and processed foods. I also encourage moderate intensity exercise 150 minutes weekly. This is 3-5 times weekly for 30-50 minutes each session. Goal should be pace of 3 miles/hours, or walking 1.5 miles in 30 minutes. The ASCVD Risk score (Arnett DK, et al., 2019) failed to calculate for the following reasons:   Risk score cannot be calculated because patient has a medical history suggesting prior/existing ASCVD

## 2024-02-17 NOTE — Assessment & Plan Note (Signed)
 3-5 minute discussion regarding the harms of tobacco use, the benefits of cessation, and methods of cessation. Discussed that there are medication options to help with cessation. Provided printed education on steps to quit smoking. He has tried wellbutrin, nicotine products, and chantix in the past. Would not like to try a medication at this time.

## 2024-02-17 NOTE — Assessment & Plan Note (Signed)
 Chronic. Long history of smoking, COPD, lung nodules. Previously followed by [facility], will refer to establish locally. Counseled on risks of smoking and importance of tobacco cessation. He has tried wellbutrin, nicotine products, and chantix in the past. Would not like to try a medication at this time.

## 2024-02-17 NOTE — Assessment & Plan Note (Signed)
 A1c and uACR today. Foot exam today. Vaccines declined. Retinal eye exam utd. On metformin , glipizide , farxiga . Start ozempic  0.25mg  weekly and increase as tolerated. No history of pancreatitis and personal or family history of MEN2 or MTC. Admits very poor diet.  Recommend heart healthy diet such as Mediterranean diet with whole grains, fruits, vegetable, fish, lean meats, nuts, and olive oil. Limit salt. Encouraged moderate walking, 3-5 times/week for 30-50 minutes each session. Aim for at least 150 minutes.week. Goal should be pace of 3 miles/hours, or walking 1.5 miles in 30 minutes. Seek medical care for urinary frequency, extreme thirst, vision changes, lightheadedness, dizziness.  Follow up in 4 weeks.

## 2024-02-17 NOTE — Progress Notes (Addendum)
 Subjective:  HPI: Ian [name] is a 53 y.o. male presenting on 02/17/2024 for Annual Exam (Needs foot exam and A1C) and Knee Pain (Pt c/o pain to R knee for last couple of months. )   Patient is in today for chronic condition management. He does see cardiology and EP for his PAF, CHF, HLD, HTN, CAD, and PAD.   DM2: not checking, last A1c >8%, on Metformin , Farxiga , Glipizide   PAF: Eliquis  5mg  BID for CHADS2VASC 4, Tikosyn  250mcg BID, Lopressor  25mg  PRN, ablation planed for   HLD: Rosuvastatin   HTN: Metoprolol  100mg  daily, Entresto  24-26mg  daily, Spironolactone  25mg  daily,   COPD: current 0.5ppd smoker, ADLs not limited  Chronic systolic CHF: EF 59-54% on 12/16/23 ECHO, lasix ?, spironolactone   CAD: s/p RCA stent 2016, 2018  OSA on CPAP: non-compliant with CPAP, advised to follow up on treatment options, recent sleep study 02/02/24  PAD: s/p stent to left common iliac artery 2021, needs reangio for occlusion of stent  Right knee pain: osteoarthritis on x-ray. Has tried voltaren. Declines PT at this time.   Review of Systems  Respiratory: Negative.    Cardiovascular: Negative.   Gastrointestinal: Negative.   Genitourinary: Negative.   Musculoskeletal:  Positive for joint pain.  Neurological:  Negative for sensory change and weakness.  Endo/Heme/Allergies:  Negative for polydipsia.  All other systems reviewed and are negative.   Relevant past medical history reviewed and updated as indicated.   Past Medical History:  Diagnosis Date   Arthritis    left ankle (09/04/2016)   Cardiomyopathy (HCC)    a. EF 40-45% by prior echo. .b. 50-55% in 02/2020.   Complication of anesthesia    hard to get me out of it sometimes; usually takes me longer to get under   Coronary artery disease    a. multiple PCIs to RCA.   Essential hypertension 12/19/2015   Hyperlipidemia 05/21/2015   Hypertension    NSTEMI (non-ST elevated myocardial infarction) (HCC) 05/2015   thelbert 05/23/2015  minor one   PAD (peripheral artery disease) (HCC)    PAF (paroxysmal atrial fibrillation) (HCC)    Tobacco abuse 05/21/2015   Type II diabetes mellitus (HCC)      Past Surgical History:  Procedure Laterality Date   ABDOMINAL AORTOGRAM W/LOWER EXTREMITY Bilateral 03/27/2020   Procedure: ABDOMINAL AORTOGRAM W/LOWER EXTREMITY;  Surgeon: Court Dorn PARAS, MD;  Location: [facility] INVASIVE CV LAB;  Service: Cardiovascular;  Laterality: Bilateral;   CARDIAC CATHETERIZATION  2000, 2005   less than 20% blockage   CARDIAC CATHETERIZATION N/A 05/22/2015   Procedure: Left Heart Cath and Coronary Angiography;  Surgeon: Candyce GORMAN Reek, MD;  Location: [facility] INVASIVE CV LAB;  Service: Cardiovascular;  Laterality: N/A;   CARDIAC CATHETERIZATION N/A 05/22/2015   Procedure: Coronary Stent Intervention;  Surgeon: Candyce GORMAN Reek, MD;  Location: [facility] INVASIVE CV LAB;  Service: Cardiovascular;  Laterality: N/A;   CARDIAC CATHETERIZATION N/A 09/04/2016   Procedure: Left Heart Cath and Coronary Angiography;  Surgeon: Candyce GORMAN Reek, MD;  Location: [facility] INVASIVE CV LAB;  Service: Cardiovascular;  Laterality: N/A;   CARDIAC CATHETERIZATION N/A 09/04/2016   Procedure: Coronary Balloon Angioplasty;  Surgeon: Candyce GORMAN Reek, MD;  Location: [facility] INVASIVE CV LAB;  Service: Cardiovascular;  Laterality: N/A;   CARDIAC CATHETERIZATION N/A 09/05/2016   Procedure: Coronary Atherectomy;  Surgeon: Alm LELON Clay, MD;  Location[facility] INVASIVE CV LAB;  Service: Cardiovascular;  Laterality: N/A;   CARDIAC CATHETERIZATION N/A 09/05/2016   Procedure: Coronary Stent Intervention;  Surgeon: Alm  LELON Clay, MD;  Location: [facility] INVASIVE CV LAB;  Service: Cardiovascular;  Laterality: N/A;   CARDIAC CATHETERIZATION N/A 09/05/2016   Procedure: Temporary Pacemaker;  Surgeon: Alm LELON Clay, MD;  Location: [facility] INVASIVE CV LAB;  Service: Cardiovascular;  Laterality: N/A;   CARDIOVERSION N/A 10/13/2023   Procedure: CARDIOVERSION;  Surgeon:  Zenaida Morene PARAS, MD;  Location: [facility] INVASIVE CV LAB;  Service: Cardiovascular;  Laterality: N/A;   CORONARY ANGIOPLASTY     CORONARY PRESSURE/FFR STUDY N/A 10/16/2018   Procedure: INTRAVASCULAR PRESSURE WIRE/FFR STUDY;  Surgeon: Dann Candyce RAMAN, MD;  Location: [facility] INVASIVE CV LAB;  Service: Cardiovascular;  Laterality: N/A;   LEFT HEART CATH AND CORONARY ANGIOGRAPHY N/A 10/16/2018   Procedure: LEFT HEART CATH AND CORONARY ANGIOGRAPHY;  Surgeon: Dann Candyce RAMAN, MD;  Location: [facility] INVASIVE CV LAB;  Service: Cardiovascular;  Laterality: N/A;   MULTIPLE TOOTH EXTRACTIONS  2013   all of them   PERIPHERAL VASCULAR INTERVENTION  03/27/2020   Procedure: PERIPHERAL VASCULAR INTERVENTION;  Surgeon: Court Dorn PARAS, MD;  Location: [facility] INVASIVE CV LAB;  Service: Cardiovascular;;  Left iliac artery    TYMPANOSTOMY TUBE PLACEMENT Bilateral 1970s    Allergies and medications reviewed and updated.   Current Outpatient Medications:    albuterol  (VENTOLIN  HFA) 108 (90 Base) MCG/ACT inhaler, Inhale 1-2 puffs into the lungs every 6 (six) hours as needed for wheezing or shortness of breath., Disp: 10 each, Rfl: 6   apixaban  (ELIQUIS ) 5 MG TABS tablet, Take 1 tablet (5 mg total) by mouth 2 (two) times daily., Disp: 180 tablet, Rfl: 1   Blood Glucose Monitoring Suppl DEVI, 1 each by Does not apply route in the morning, at noon, and at bedtime. May substitute to any manufacturer covered by patient's insurance., Disp: 1 each, Rfl: 0   CINNAMON PO, Take 1,000 mg by mouth 2 (two) times daily., Disp: , Rfl:    clopidogrel  (PLAVIX ) 75 MG tablet, Take 1 tablet (75 mg total) by mouth daily., Disp: 90 tablet, Rfl: 3   dapagliflozin  propanediol (FARXIGA ) 5 MG TABS tablet, Take 1 tablet (5 mg total) by mouth daily., Disp: 30 tablet, Rfl: 11   dofetilide  (TIKOSYN ) 250 MCG capsule, Take 1 capsule (250 mcg total) by mouth 2 (two) times daily., Disp: 180 capsule, Rfl: 6   furosemide  (LASIX ) 40 MG tablet, Take 40 mg by  mouth daily., Disp: , Rfl:    gabapentin  (NEURONTIN ) 100 MG capsule, START WITH 1 CAPSULE AND INCREASE TO 3 CAPSULES BY MOUTH NIGHTLY, Disp: 90 capsule, Rfl: 0   glipiZIDE  (GLUCOTROL ) 10 MG tablet, TAKE 1 TABLET BY MOUTH TWICE DAILY BEFORE A MEAL, Disp: 180 tablet, Rfl: 0   metFORMIN  (GLUCOPHAGE ) 1000 MG tablet, Take 1 tablet (1,000 mg total) by mouth 2 (two) times daily with a meal., Disp: 180 tablet, Rfl: 3   metoprolol  succinate (TOPROL -XL) 100 MG 24 hr tablet, Take 1 tablet (100 mg total) by mouth daily. Take with or immediately following a meal., Disp: 90 tablet, Rfl: 3   metoprolol  tartrate (LOPRESSOR ) 25 MG tablet, Take 1 tablet (25 mg total) by mouth 2 (two) times daily as needed (HR >100)., Disp: 60 tablet, Rfl: 11   nitroGLYCERIN  (NITROSTAT ) 0.4 MG SL tablet, Place 1 tablet under the tongue every 5 (five) minutes as needed for chest pain., Disp: , Rfl:    Omega 3 1000 MG CAPS, Take 1,000 mg by mouth 2 (two) times daily., Disp: , Rfl:    rosuvastatin  (CRESTOR ) 20 MG tablet, Take 1  tablet (20 mg total) by mouth daily., Disp: 90 tablet, Rfl: 0   sacubitril -valsartan  (ENTRESTO ) 24-26 MG, TAKE 1 TABLET BY MOUTH TWICE DAILY. START THIS ON THURSDAY 2/13, Disp: 60 tablet, Rfl: 11   Semaglutide ,0.25 or 0.5MG /DOS, (OZEMPIC , 0.25 OR 0.5 MG/DOSE,) 2 MG/1.5ML SOPN, Inject 0.25 mg into the skin once a week for 28 days, THEN 0.5 mg once a week for 28 days. Start with 0.25MG  once a week x 4 weeks, then increase to 0.5MG  weekly., Disp: 2.25 mL, Rfl: 0   sildenafil  (VIAGRA ) 50 MG tablet, Take 1 tablet (50 mg total) by mouth daily as needed for erectile dysfunction., Disp: 10 tablet, Rfl: 6   spironolactone  (ALDACTONE ) 25 MG tablet, Take 1 tablet by mouth once daily, Disp: 30 tablet, Rfl: 11  Allergies  Allergen Reactions   Atorvastatin  Other (See Comments)    Pt reports causes myalgias. Tolerating rosuvastatin  low dose    Objective:   BP 126/68   Pulse 96   Ht 5' 10 (1.778 m)   Wt 260 lb 6.4 oz  (118.1 kg)   SpO2 99%   BMI 37.36 kg/m      02/17/2024    7:51 AM 02/11/2024    8:41 AM 02/06/2024   12:01 PM  Vitals with BMI  Height 5' 10 5' 10   Weight 260 lbs 6 oz 260 lbs 6 oz   BMI 37.36 37.36   Systolic 126 132 856  Diastolic 68 92 99  Pulse 96 126      Physical Exam Vitals and nursing note reviewed.  Constitutional:      Appearance: Normal appearance. He is normal weight.  HENT:     Head: Normocephalic and atraumatic.  Cardiovascular:     Rate and Rhythm: Normal rate and regular rhythm.     Pulses: Normal pulses.     Heart sounds: Normal heart sounds.  Pulmonary:     Effort: Pulmonary effort is normal.     Breath sounds: Normal breath sounds.  Skin:    General: Skin is warm and dry.     Capillary Refill: Capillary refill takes less than 2 seconds.  Neurological:     General: No focal deficit present.     Mental Status: He is alert and oriented to person, place, and time. Mental status is at baseline.  Psychiatric:        Mood and Affect: Mood normal.        Behavior: Behavior normal.        Thought Content: Thought content normal.        Judgment: Judgment normal.     Assessment & Plan:  Type 2 diabetes mellitus with complication, without long-term current use of insulin  (HCC) Assessment & Plan: A1c and uACR today. Foot exam today. Vaccines declined. Retinal eye exam utd. On metformin , glipizide , farxiga . Start ozempic  0.25mg  weekly and increase as tolerated. No history of pancreatitis and personal or family history of MEN2 or MTC. Admits very poor diet.  Recommend heart healthy diet such as Mediterranean diet with whole grains, fruits, vegetable, fish, lean meats, nuts, and olive oil. Limit salt. Encouraged moderate walking, 3-5 times/week for 30-50 minutes each session. Aim for at least 150 minutes.week. Goal should be pace of 3 miles/hours, or walking 1.5 miles in 30 minutes. Seek medical care for urinary frequency, extreme thirst, vision changes,  lightheadedness, dizziness.  Follow up in 4 weeks.   Orders: -     Lipid panel -     Hemoglobin A1c -  Parathyroid  hormone, intact (no Ca) -     PSA -     Cologuard -     Basic Metabolic Panel Without GFR -     Vitamin B12  Mixed hyperlipidemia Assessment & Plan: Labs today. Continue rosuvastatin .Long discussion regarding importance of diet and exercise. I recommend consuming a heart healthy diet such as Mediterranean diet or DASH diet with whole grains, fruits, vegetable, fish, lean meats, nuts, and olive oil. Limit sweets and processed foods. I also encourage moderate intensity exercise 150 minutes weekly. This is 3-5 times weekly for 30-50 minutes each session. Goal should be pace of 3 miles/hours, or walking 1.5 miles in 30 minutes. The ASCVD Risk score (Arnett DK, et al., 2019) failed to calculate for the following reasons:   Risk score cannot be calculated because patient has a medical history suggesting prior/existing ASCVD   Orders: -     Lipid panel  Colon cancer screening -     Cologuard  Prostate cancer screening -     PSA  Essential hypertension Assessment & Plan: Well controlled on Metoprolol  and Entresto . Recommend heart healthy diet such as Mediterranean diet with whole grains, fruits, vegetable, fish, lean meats, nuts, and olive oil. Limit salt. Encouraged moderate walking, 3-5 times/week for 30-50 minutes each session. Aim for at least 150 minutes.week. Goal should be pace of 3 miles/hours, or walking 1.5 miles in 30 minutes. Avoid tobacco products. Avoid excess alcohol. Take medications as prescribed and bring medications and blood pressure log with cuff to each office visit. Seek medical care for chest pain, palpitations, shortness of breath with exertion, dizziness/lightheadedness, vision changes, recurrent headaches, or swelling of extremities.    Coronary artery disease due to lipid rich plaque Assessment & Plan: RCA stent in 2016 and required restent in  2018. No chest pain. Nonischemic myocardial perfustion study 07/18/2023. Well controlled HTN. Labs today to eval lipids and blood sugar control.    PAF (paroxysmal atrial fibrillation) (HCC) Assessment & Plan: Followed by Cardiology and EP, on eliquis  and tikosyn  with PRN metoprolol . Plan for ablation next month.    Acute pain of right knee  Cigarette nicotine  dependence without complication Assessment & Plan: 3-5 minute discussion regarding the harms of tobacco use, the benefits of cessation, and methods of cessation. Discussed that there are medication options to help with cessation. Provided printed education on steps to quit smoking. He has tried wellbutrin, nicotine  products, and chantix in the past. Would not like to try a medication at this time.    Chronic obstructive pulmonary disease, unspecified COPD type (HCC) Assessment & Plan: Chronic. Long history of smoking, COPD, lung nodules. Previously followed by [facility], will refer to establish locally. Counseled on risks of smoking and importance of tobacco cessation. He has tried wellbutrin, nicotine  products, and chantix in the past. Would not like to try a medication at this time.  Orders: -     AMB  Referral to Pulmonary Nodule Clinic  Other orders -     Ozempic  (0.25 or 0.5 MG/DOSE); Inject 0.25 mg into the skin once a week for 28 days, THEN 0.5 mg once a week for 28 days. Start with 0.25MG  once a week x 4 weeks, then increase to 0.5MG  weekly.  Dispense: 2.25 mL; Refill: 0     Follow up plan: Return in about 4 weeks (around 03/16/2024) for diabetes, follow-up.  Ian GORMAN Barrio, FNP

## 2024-02-18 ENCOUNTER — Telehealth: Payer: Self-pay | Admitting: *Deleted

## 2024-02-18 ENCOUNTER — Ambulatory Visit: Payer: Self-pay | Admitting: Family Medicine

## 2024-02-18 LAB — HEMOGLOBIN A1C
Hgb A1c MFr Bld: 8 % — ABNORMAL HIGH (ref <5.7)
Mean Plasma Glucose: 183 mg/dL
eAG (mmol/L): 10.1 mmol/L

## 2024-02-18 LAB — BASIC METABOLIC PANEL WITHOUT GFR
BUN: 18 mg/dL (ref 7–25)
CO2: 26 mmol/L (ref 20–32)
Calcium: 9.9 mg/dL (ref 8.6–10.3)
Chloride: 101 mmol/L (ref 98–110)
Creat: 0.93 mg/dL (ref 0.70–1.30)
Glucose, Bld: 217 mg/dL — ABNORMAL HIGH (ref 65–99)
Potassium: 4.1 mmol/L (ref 3.5–5.3)
Sodium: 137 mmol/L (ref 135–146)

## 2024-02-18 LAB — LIPID PANEL
Cholesterol: 113 mg/dL (ref <200)
HDL: 32 mg/dL — ABNORMAL LOW (ref >=40)
LDL Cholesterol (Calc): 48 mg/dL
Non-HDL Cholesterol (Calc): 81 mg/dL (ref <130)
Total CHOL/HDL Ratio: 3.5 (calc) (ref <5.0)
Triglycerides: 279 mg/dL — ABNORMAL HIGH (ref <150)

## 2024-02-18 LAB — VITAMIN B12: Vitamin B-12: 308 pg/mL (ref 200–1100)

## 2024-02-18 LAB — PSA: PSA: 0.83 ng/mL (ref <=4.00)

## 2024-02-18 LAB — PARATHYROID HORMONE, INTACT (NO CA): PTH: 44 pg/mL (ref 16–77)

## 2024-02-18 NOTE — Telephone Encounter (Signed)
 The patient has been notified of the result and verbalized understanding.  All questions (if any) were answered. Joshua Dalton Seip, CMA 02/18/2024 1:12 PM    Upon patient request DME selection is [facility] Patient understands he will be contacted by [facility] to set up his cpap. Patient understands to call if [facility] does not contact him with new setup in a timely manner. Patient understands they will be called once confirmation has been received from [facility] that they have received their new machine to schedule 10 week follow up appointment.  ResMed CPAP at 12cm H2O with heated humidity, medium ResMed Airfit F20 mask and supplemental O2 at 2L via CPAP.      [facility] notified of new cpap order  Please add to airview Patient was grateful for the call and thanked me.

## 2024-02-19 ENCOUNTER — Telehealth: Payer: Self-pay | Admitting: Family Medicine

## 2024-02-19 NOTE — Telephone Encounter (Signed)
 Copied from CRM [id number]. Topic: Clinical - Lab/Test Results >> Feb 19, 2024  2:06 PM Ian [name] wrote: Reason for CRM: Pt returning [name], CMA call regarding new lab results, which were provided. Pt agreed to Ozempic . Pt is already taking Omega 3 2000 mg of fish oil. Pt wants to know what he needs to eat more of and not. Please contact pt via MyChart.

## 2024-02-27 ENCOUNTER — Telehealth: Payer: Self-pay

## 2024-02-27 ENCOUNTER — Ambulatory Visit (HOSPITAL_COMMUNITY)

## 2024-02-27 ENCOUNTER — Other Ambulatory Visit: Payer: Self-pay | Admitting: Family Medicine

## 2024-02-27 DIAGNOSIS — R200 Anesthesia of skin: Secondary | ICD-10-CM

## 2024-02-27 DIAGNOSIS — M542 Cervicalgia: Secondary | ICD-10-CM

## 2024-02-27 NOTE — Telephone Encounter (Signed)
 Copied from CRM [id number]. Topic: Clinical - Order For Equipment >> Feb 27, 2024  1:57 PM [name] wrote: Reason for CRM: Pt calling to f/u on Cologuard collection kit to his home that he has not received yet. Callback number is [phone].

## 2024-02-27 NOTE — Telephone Encounter (Signed)
 Copied from CRM [id number]. Topic: Clinical - Medication Refill >> Feb 27, 2024  1:55 PM [name] wrote: Medication: gabapentin  (NEURONTIN ) 100 MG capsule  Has the patient contacted their pharmacy? Yes (Agent: If no, request that the patient contact the pharmacy for the refill. If patient does not wish to contact the pharmacy document the reason why and proceed with request.) (Agent: If yes, when and what did the pharmacy advise?)  This is the patient's preferred pharmacy:  [facility] [address], [place] - [address] [place] [address] Phone: [phone] Fax: [phone]    Is this the correct pharmacy for this prescription? Yes If no, delete pharmacy and type the correct one.   Has the prescription been filled recently? No  Is the patient out of the medication? No  Has the patient been seen for an appointment in the last year OR does the patient have an upcoming appointment? Yes  Can we respond through MyChart? Yes  Agent: Please be advised that Rx refills may take up to 3 business days. We ask that you follow-up with your pharmacy.

## 2024-03-01 MED ORDER — GABAPENTIN 100 MG PO CAPS: ORAL_CAPSULE | ORAL | 0 refills | Status: DC

## 2024-03-01 NOTE — Telephone Encounter (Signed)
 Requested medication (s) are due for refill today: yes  Requested medication (s) are on the active medication list: yes  Last refill:  02/12/24  Future visit scheduled: no  Notes to clinic:  routing for review of Sig.     Requested Prescriptions  Pending Prescriptions Disp Refills   gabapentin  (NEURONTIN ) 100 MG capsule 90 capsule 0    Sig: START WITH 1 CAPSULE AND INCREASE TO 3 CAPSULES BY MOUTH NIGHTLY     Neurology: Anticonvulsants - gabapentin  Passed - 03/01/2024  1:52 PM      Passed - Cr in normal range and within 360 days    Creat  Date Value Ref Range Status  02/17/2024 0.93 0.70 - 1.30 mg/dL Final   Creatinine, Urine  Date Value Ref Range Status  06/09/2023 70 20 - 320 mg/dL Final         Passed - Completed PHQ-2 or PHQ-9 in the last 360 days      Passed - Valid encounter within last 12 months    Recent Outpatient Visits           1 week ago Type 2 diabetes mellitus with complication, without long-term current use of insulin  [facility])   [place] [facility] Kayla Jeoffrey RAMAN, FNP   2 months ago Acute pain of right knee   [place] [facility] Aletha Bene, MD   6 months ago Cervical radiculopathy   [place] [facility] Duanne Butler DASEN, MD   8 months ago Physical exam, annual   [place] [facility] Kayla Jeoffrey RAMAN, FNP       Future Appointments             In 1 week Lesia Ozell Barter, [name] [facility] at [facility] A Dept of [employer]. [facility] [employer], [employer]   In 1 month Court, Dorn PARAS, MD [facility] at [facility] A Dept of [employer] [place]. [facility] [employer], [employer]

## 2024-03-02 ENCOUNTER — Encounter (HOSPITAL_BASED_OUTPATIENT_CLINIC_OR_DEPARTMENT_OTHER): Payer: Self-pay

## 2024-03-04 ENCOUNTER — Ambulatory Visit (HOSPITAL_COMMUNITY)
Admission: RE | Admit: 2024-03-04 | Discharge: 2024-03-04 | Disposition: A | Discharge status: Home / Self Care | Source: Ambulatory Visit | Attending: Cardiology | Admitting: Cardiology

## 2024-03-04 DIAGNOSIS — I5022 Chronic systolic (congestive) heart failure: Secondary | ICD-10-CM | POA: Diagnosis not present

## 2024-03-04 DIAGNOSIS — I4819 Other persistent atrial fibrillation: Secondary | ICD-10-CM | POA: Diagnosis not present

## 2024-03-04 LAB — CBC

## 2024-03-04 MED ORDER — IOHEXOL 350 MG/ML SOLN
100.0000 mL | Freq: Once | INTRAVENOUS | Status: AC | PRN
  Administered 2024-03-04: 100 mL via INTRAVENOUS

## 2024-03-05 LAB — CBC
Hematocrit: 49.5 (ref 37.5–51.0)
Hemoglobin: 17 g/dL (ref 13.0–17.7)
MCH: 33.7 pg — AB (ref 26.6–33.0)
MCHC: 34.3 g/dL (ref 31.5–35.7)
MCV: 98 fL — AB (ref 79–97)
Platelets: 167 x10E3/uL (ref 150–450)
RBC: 5.05 x10E6/uL (ref 4.14–5.80)
RDW: 12 (ref 11.6–15.4)
WBC: 8.1 x10E3/uL (ref 3.4–10.8)

## 2024-03-05 LAB — BASIC METABOLIC PANEL WITH GFR
BUN/Creatinine Ratio: 25 — ABNORMAL HIGH (ref 9–20)
BUN: 24 mg/dL (ref 6–24)
CO2: 19 mmol/L — ABNORMAL LOW (ref 20–29)
Calcium: 9.7 mg/dL (ref 8.7–10.2)
Chloride: 97 mmol/L (ref 96–106)
Creatinine, Ser: 0.96 mg/dL (ref 0.76–1.27)
Glucose: 100 mg/dL — ABNORMAL HIGH (ref 70–99)
Potassium: 4.4 mmol/L (ref 3.5–5.2)
Sodium: 134 mmol/L (ref 134–144)
eGFR: 93 mL/min/1.73 (ref >59)

## 2024-03-08 NOTE — Progress Notes (Unsigned)
  Electrophysiology Office Note:   Date:  03/09/2024  ID:  Deontra Pereyra, DOB 08-07-1971, MRN [id number]  Primary Cardiologist: Dorn Lesches, MD Electrophysiologist: [name], MD   Electrophysiologist:  [name], MD      History of Present Illness:   Ian [name] is a 53 y.o. male with h/o CAD, chronic systolic CHF, HLD, HTN, PAD, and (longstanding persistent suspected) atrial fibrillation  seen today for routine electrophysiology followup.   S/p Tikosyn  ablation last week of June. Seen in AF clinic 7/2 and was in AF RVR  Since last being seen in our clinic the patient reports doing OK. Remains in AF but symptomatically doing OK. No missed doses of OAC or Tikosyn .  Mild fatigue with exertion. Mild palpitations, Otherwise, he denies chest pain, dyspnea, PND, orthopnea, nausea, vomiting, dizziness, syncope, edema, weight gain, or early satiety.   Review of systems complete and found to be negative unless listed in HPI.   EP Information / Studies Reviewed:    EKG is ordered today. Personal review as below.  EKG Interpretation Date/Time:  Tuesday March 09 2024 08:20:46 EDT Ventricular Rate:  91 PR Interval:    QRS Duration:  86 QT Interval:  380 QTC Calculation: 467 R Axis:   107  Text Interpretation: Atrial fibrillation Rightward axis When compared with ECG of 11-Feb-2024 08:44, No significant change was found Confirmed by Lesia Sharper [phone]) on 03/09/2024 8:37:59 AM    Arrhythmia/Device History No specialty comments available.   Physical Exam:   VS:  BP 110/82   Pulse 91   Ht 5' 10 (1.778 m)   Wt 254 lb (115.2 kg)   SpO2 99%   BMI 36.45 kg/m    Wt Readings from Last 3 Encounters:  03/09/24 254 lb (115.2 kg)  02/17/24 260 lb 6.4 oz (118.1 kg)  02/11/24 260 lb 6.4 oz (118.1 kg)     GEN: No acute distress NECK: No JVD; No carotid bruits CARDIAC: Irregularly irregular rate and rhythm, no murmurs, rubs, gallops RESPIRATORY:  Clear to auscultation  without rales, wheezing or rhonchi  ABDOMEN: Soft, non-tender, non-distended EXTREMITIES:  No edema; No deformity   ASSESSMENT AND PLAN:    Persistent AF EKG today shows AF with controlled rate  Continue tikosyn  250 mcg BID Surveillance labs stable, pending ablation next week.  Continue eliquis  5 mg BID for CHA2DS2/VASc of at least 4  Reviewed instructions for Ablation 8/8.  Secondary hypercoagulable state Pt on Eliquis  as above   CAD No s/s of ischemia.     HTN Stable on current regimen    Follow up with EP Team as usual post procedure  Signed, Sharper Prentice Lesia, [name]

## 2024-03-09 ENCOUNTER — Encounter: Payer: Self-pay | Admitting: Student

## 2024-03-09 ENCOUNTER — Ambulatory Visit: Attending: Internal Medicine | Admitting: Student

## 2024-03-09 VITALS — BP 110/82 | HR 91 | Ht 70.0 in | Wt 254.0 lb

## 2024-03-09 DIAGNOSIS — Z79899 Other long term (current) drug therapy: Secondary | ICD-10-CM | POA: Diagnosis not present

## 2024-03-09 DIAGNOSIS — Z5181 Encounter for therapeutic drug level monitoring: Secondary | ICD-10-CM | POA: Diagnosis not present

## 2024-03-09 DIAGNOSIS — I4811 Longstanding persistent atrial fibrillation: Secondary | ICD-10-CM | POA: Insufficient documentation

## 2024-03-09 DIAGNOSIS — I5022 Chronic systolic (congestive) heart failure: Secondary | ICD-10-CM | POA: Diagnosis not present

## 2024-03-09 DIAGNOSIS — I4891 Unspecified atrial fibrillation: Secondary | ICD-10-CM | POA: Diagnosis not present

## 2024-03-09 DIAGNOSIS — G4733 Obstructive sleep apnea (adult) (pediatric): Secondary | ICD-10-CM | POA: Diagnosis not present

## 2024-03-09 NOTE — Patient Instructions (Signed)
 Medication Instructions:  Your physician recommends that you continue on your current medications as directed. Please refer to the Current Medication list given to you today.  *If you need a refill on your cardiac medications before your next appointment, please call your pharmacy*  Lab Work: None ordered If you have labs (blood work) drawn today and your tests are completely normal, you will receive your results only by: MyChart Message (if you have MyChart) OR A paper copy in the mail If you have any lab test that is abnormal or we need to change your treatment, we will call you to review the results.  Testing/Procedures: See letter  Follow-Up: At [facility], you and your health needs are our priority.  As part of our continuing mission to provide you with exceptional heart care, our providers are all part of one team.  This team includes your primary Cardiologist (physician) and Advanced Practice Providers or APPs (Physician Assistants and Nurse Practitioners) who all work together to provide you with the care you need, when you need it.  Your next appointment:   Follow up will be arranged for you and print out on your discharge summary after your procedure.

## 2024-03-11 ENCOUNTER — Telehealth (HOSPITAL_COMMUNITY): Payer: Self-pay

## 2024-03-11 LAB — COLOGUARD: COLOGUARD: NEGATIVE

## 2024-03-11 NOTE — Telephone Encounter (Signed)
 Spoke with patient to discuss upcoming procedure.   CT: completed.  Labs: completed.   Any recent signs of acute illness or been started on antibiotics? No Any new medications started? No Any medications to hold? Hold Semaglutide  for 1 week prior to the procedure- last dose on today, July 31. Hold Farxiga  for 3 days prior to the procedure- last dose on August 04.   Any missed doses of blood thinner? No Advised patient to continue taking ANTICOAGULANT: Eliquis  (Apixaban ) twice daily without missing any doses.  Medication instructions:  On the morning of your procedure DO NOT take any medication., including Eliquis  or the procedure may be rescheduled. Nothing to eat or drink after midnight prior to your procedure.  Confirmed patient is scheduled for Atrial Fibrillation Ablation on Friday, August 8 with Dr. Ole Holts. Instructed patient to arrive at the Main Entrance A at [facility]: [address] [place], [place] [address] and check in at Admitting at 8:00 AM.  Advised of plan to go home the same day and will only stay overnight if medically necessary. You MUST have a responsible adult to drive you home and MUST be with you the first 24 hours after you arrive home or your procedure could be cancelled.  Patient verbalized understanding to all instructions provided and agreed to proceed with procedure.

## 2024-03-12 DIAGNOSIS — I5022 Chronic systolic (congestive) heart failure: Secondary | ICD-10-CM | POA: Diagnosis not present

## 2024-03-12 DIAGNOSIS — G4733 Obstructive sleep apnea (adult) (pediatric): Secondary | ICD-10-CM | POA: Diagnosis not present

## 2024-03-14 NOTE — Progress Notes (Signed)
 ADVANCED HEART FAILURE FOLLOW UP CLINIC NOTE  Referring Physician: Kayla Jeoffrey RAMAN, FNP  Primary Care: Kayla Jeoffrey RAMAN, FNP Primary Cardiologist:  HPI: Ian [name] is a 53 y.o. male with a PMH of  PAD, CAD s/p RCA disease, long standing persistent atrial fibrillation, HFmrEF who presents for follow up of chronic heart failure with minimally reduced EF.      Per chart review, patient previously had NSTEMI in 2016.  Underwent successful PCI of the mid RCA with drug-eluting stent.  Echocardiogram showed EF 45-50%.  He was noted to have A-fib at that time and was started on Eliquis . Underwent repeat PCI of the RCA for ISR and distal calcified disease.    He moved to [facility] for three years and was lost to follow up. It appears that he has been trial on tikosyn  for his atrial fibrillation without success, held off on amiodarone given suspected pulmonary disease.    Returns for referral after echocardiogram showed reduced EF of 35-40%.      SUBJECTIVE:  Unfortuantely back in atrial fibrillation after the procedure.  He did report that he was feeling better initially when he was in sinus rhythm, is not clear when he flipped back.  Heart rate is controlled.  He has gained some weight, but he reports that his likely secondary to starting working in a [employer].  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  he otherwise reports doing well.  PMH, current medications, allergies, social history, and family history reviewed in epic.  PHYSICAL EXAM: There were no vitals filed for this visit.  GENERAL: Well nourished and in no apparent distress at rest.  PULM:  Normal work of breathing, clear to auscultation bilaterally. Respirations are unlabored.  CARDIAC:  JVP: Flat         Irregular rate and rhythm. No murmurs, rubs or gallops.  Trace lower extremity edema. Warm and well perfused extremities. ABDOMEN: Soft, non-tender,  non-distended. NEUROLOGIC: Patient is oriented x3 with no focal or lateralizing neurologic deficits.    DATA REVIEW  ECG: 09/2023: Afib with RVR     ECHO: 02/2020 showed EF 50-55%, mildly reduced RV function, mild MR    07/2023: Moderately reduced EF 35-40%, normal RV function no significant valvular disease   CATH: 10/16/2018: previously placed proximal-mid RCA stents were widely patent with only mild restenosis, 50% stenosis in mid circumflex that was not significant, and 25% stenosis in proximal-mid LAD     PET stress 2024: No evidence of ischemia  Heart failure review: - Classification: Heart failure with reduced EF - Etiology: Work up ongoing - NYHA Class: II - Volume status: Euvolemic - ACEi/ARB/ARNI: Currently up-titrating - Aldosterone antagonist: Maximally tolerated dose - Beta-blocker: Currently up-titrating - Digoxin: Not indicated - Hydralazine /Nitrates: Not indicated - SGLT2i: Maximally tolerated dose - GLP-1: Consider in future - Advanced therapies: Not needed at this time - ICD: Currently uptitrating GDMT  ASSESSMENT & PLAN:  Chronic systolic heart failure: Patient with worsening in ejection fraction in the setting of ongoing atrial fibrillation with borderline rates.  Cardioversion initially successful but now back in atrial fibrillation.  Given normal left atrial size will arrange for follow-up in EP clinic for consideration of ablation versus drug therapy.  In addition, sleep study ordered.  Given that he has now been trialed on medical therapy will also arrange for repeat echo. -Continue Entresto  24/26 mg twice daily -Continue spironolactone  25 mg daily -Increase  metoprolol  to 100mg  daily -Continue farxiga  5mg  daily -Continue Lasix  40 mg daily, will assess volume status after addition of MRA and ARNI - Sleep study ordered given erythrocytosis - Afib clinic referral as well  - Echo with next visit   Atrial fibrillation: Longstanding persistent, though  apparently has not been cardioverted.  Went into sinus rhythm but now back in afib, suspect sleep apnea at least playing a component. Will refer to EP for consideration of ablation. - Continue apixaban , no recent missed doses - EP referral   CAD: Prior RCA disease and left heart cath in 2020 with stable ischemic disease.  Repeat PET in 2024 without evidence of new disease. -Continue Plavix , apixaban , Crestor  20 mg daily   PAD: Follows with Dr. Court. - Continue disease prevention as above - Smoking cessation discussed, patient is trying to cut back   HLD: Last LDL 68. - Consider addition of zetia to target <55 given PAD/CAD  Follow up in 3 months  Morene Brownie, MD Advanced Heart Failure Mechanical Circulatory Support 03/14/24

## 2024-03-15 ENCOUNTER — Encounter (HOSPITAL_COMMUNITY): Payer: Self-pay | Admitting: Cardiology

## 2024-03-15 ENCOUNTER — Ambulatory Visit (HOSPITAL_COMMUNITY)
Admission: RE | Admit: 2024-03-15 | Discharge: 2024-03-15 | Disposition: A | Discharge status: Home / Self Care | Source: Ambulatory Visit | Attending: Cardiology | Admitting: Cardiology

## 2024-03-15 VITALS — BP 102/60 | HR 47 | Wt 254.4 lb

## 2024-03-15 DIAGNOSIS — I739 Peripheral vascular disease, unspecified: Secondary | ICD-10-CM | POA: Diagnosis not present

## 2024-03-15 DIAGNOSIS — E785 Hyperlipidemia, unspecified: Secondary | ICD-10-CM | POA: Diagnosis not present

## 2024-03-15 DIAGNOSIS — I2510 Atherosclerotic heart disease of native coronary artery without angina pectoris: Secondary | ICD-10-CM | POA: Insufficient documentation

## 2024-03-15 DIAGNOSIS — I480 Paroxysmal atrial fibrillation: Secondary | ICD-10-CM | POA: Insufficient documentation

## 2024-03-15 DIAGNOSIS — Z7901 Long term (current) use of anticoagulants: Secondary | ICD-10-CM | POA: Insufficient documentation

## 2024-03-15 DIAGNOSIS — Z955 Presence of coronary angioplasty implant and graft: Secondary | ICD-10-CM | POA: Diagnosis not present

## 2024-03-15 DIAGNOSIS — G4733 Obstructive sleep apnea (adult) (pediatric): Secondary | ICD-10-CM | POA: Diagnosis not present

## 2024-03-15 DIAGNOSIS — I252 Old myocardial infarction: Secondary | ICD-10-CM | POA: Diagnosis not present

## 2024-03-15 DIAGNOSIS — I5022 Chronic systolic (congestive) heart failure: Secondary | ICD-10-CM

## 2024-03-15 DIAGNOSIS — Z79899 Other long term (current) drug therapy: Secondary | ICD-10-CM | POA: Diagnosis not present

## 2024-03-15 DIAGNOSIS — I4811 Longstanding persistent atrial fibrillation: Secondary | ICD-10-CM | POA: Diagnosis present

## 2024-03-15 MED ORDER — METOPROLOL SUCCINATE ER 50 MG PO TB24
50.0000 mg | ORAL_TABLET | Freq: Every day | ORAL | 3 refills | Status: AC
Start: 2024-03-15 — End: ?

## 2024-03-15 NOTE — Patient Instructions (Signed)
 DECREASE Metoprolol  tp 50 mg daily.  Your physician has requested that you have an echocardiogram. Echocardiography is a painless test that uses sound waves to create images of your heart. It provides your doctor with information about the size and shape of your heart and how well your heart's chambers and valves are working. This procedure takes approximately one hour. There are no restrictions for this procedure. Please do NOT wear cologne, perfume, aftershave, or lotions (deodorant is allowed). Please arrive 15 minutes prior to your appointment time.  Please note: We ask at that you not bring children with you during ultrasound (echo/ vascular) testing. Due to room size and safety concerns, children are not allowed in the ultrasound rooms during exams. Our front office staff cannot provide observation of children in our lobby area while testing is being conducted. An adult accompanying a patient to their appointment will only be allowed in the ultrasound room at the discretion of the ultrasound technician under special circumstances. We apologize for any inconvenience.  Your physician recommends that you schedule a follow-up appointment in: 3 months with an echocardiogram (November) ** PLEASE CALL THE OFFICE IN SEPTEMBER TO ARRANGE YOUR FOLLOW UP APPOINTMENT.**  If you have any questions or concerns before your next appointment please send us  a message through [place] or call our office at [phone].    TO LEAVE A MESSAGE FOR THE NURSE SELECT OPTION 2, PLEASE LEAVE A MESSAGE INCLUDING: YOUR NAME DATE OF BIRTH CALL BACK NUMBER REASON FOR CALL**this is important as we prioritize the call backs  YOU WILL RECEIVE A CALL BACK THE SAME DAY AS LONG AS YOU CALL BEFORE 4:00 PM  At the [facility], you and your health needs are our priority. As part of our continuing mission to provide you with exceptional heart care, we have created designated Provider Care Teams. These Care Teams  include your primary Cardiologist (physician) and Advanced Practice Providers (APPs- Physician Assistants and Nurse Practitioners) who all work together to provide you with the care you need, when you need it.   You may see any of the following providers on your designated Care Team at your next follow up: Dr Toribio Fuel Dr Ezra Shuck Dr. Ria Commander Dr. Morene Brownie Amy Lenetta, NP Caffie Shed, [place] [facility], [place] Beckey Coe, NP [place] Lee, NP Ellouise Class, NP Tinnie Redman, PharmD Jaun Bash, PharmD   Please be sure to bring in all your medications bottles to every appointment.    Thank you for choosing  [facility]

## 2024-03-16 ENCOUNTER — Ambulatory Visit: Admitting: Family Medicine

## 2024-03-16 ENCOUNTER — Encounter: Payer: Self-pay | Admitting: Family Medicine

## 2024-03-16 ENCOUNTER — Telehealth: Payer: Self-pay | Admitting: Family Medicine

## 2024-03-16 VITALS — BP 124/84 | HR 68 | Temp 98.0°F | Ht 70.0 in | Wt 254.2 lb

## 2024-03-16 DIAGNOSIS — E118 Type 2 diabetes mellitus with unspecified complications: Secondary | ICD-10-CM | POA: Diagnosis not present

## 2024-03-16 DIAGNOSIS — H2513 Age-related nuclear cataract, bilateral: Secondary | ICD-10-CM | POA: Diagnosis not present

## 2024-03-16 DIAGNOSIS — E119 Type 2 diabetes mellitus without complications: Secondary | ICD-10-CM | POA: Diagnosis not present

## 2024-03-16 DIAGNOSIS — H52223 Regular astigmatism, bilateral: Secondary | ICD-10-CM | POA: Diagnosis not present

## 2024-03-16 DIAGNOSIS — Z7984 Long term (current) use of oral hypoglycemic drugs: Secondary | ICD-10-CM

## 2024-03-16 DIAGNOSIS — H524 Presbyopia: Secondary | ICD-10-CM | POA: Diagnosis not present

## 2024-03-16 MED ORDER — OZEMPIC (0.25 OR 0.5 MG/DOSE) 2 MG/1.5ML ~~LOC~~ SOPN: 0.5000 mg | PEN_INJECTOR | SUBCUTANEOUS | 0 refills | Status: AC

## 2024-03-16 MED ORDER — SEMAGLUTIDE (1 MG/DOSE) 4 MG/3ML ~~LOC~~ SOPN: 1.0000 mg | PEN_INJECTOR | SUBCUTANEOUS | 1 refills | Status: DC

## 2024-03-16 MED ORDER — OZEMPIC (0.25 OR 0.5 MG/DOSE) 2 MG/1.5ML ~~LOC~~ SOPN: 0.5000 mg | PEN_INJECTOR | SUBCUTANEOUS | 1 refills | Status: DC

## 2024-03-16 NOTE — Progress Notes (Signed)
 Subjective:  HPI: Ian [name] is a 53 y.o. male presenting on 03/16/2024 for Medical Management of Chronic Issues (4 wk f/u /Concerns of ozempic  it is still making him very nausea. Mont can not take this week due to procedure wants to ensure that will be ok to hold off. But it is time to increase he has concerns due to symptoms. )   HPI Patient is in today for follow up for diabetes. Is tolerating 0.25mg  ozempic  with mild nausea and bloating. This is on hold this week for his planned ablation. FBG has been 91-223. Has not scheduled with Pulm for lung nodule follow up.  Has completed cologuard.  DIABETES Hypoglycemic episodes:no Polydipsia/polyuria: no Visual disturbance: no Chest pain: no Paresthesias: no Glucose Monitoring: yes  Accucheck frequency: Daily  Fasting glucose:  Post prandial:  Evening:  Before meals: Taking Insulin ?: no  Long acting insulin :  Short acting insulin : Blood Pressure Monitoring: not checking Retinal Examination: Up to Date Foot Exam: Not up to Date Diabetic Education: Completed Pneumovax: not UTD Influenza: Not up to Date Aspirin : no eliquis    Review of Systems  All other systems reviewed and are negative.   Relevant past medical history reviewed and updated as indicated.   Past Medical History:  Diagnosis Date   Arthritis    left ankle (09/04/2016)   Cardiomyopathy (HCC)    a. EF 40-45% by prior echo. .b. 50-55% in 02/2020.   Complication of anesthesia    hard to get me out of it sometimes; usually takes me longer to get under   Coronary artery disease    a. multiple PCIs to RCA.   Essential hypertension 12/19/2015   Hyperlipidemia 05/21/2015   Hypertension    NSTEMI (non-ST elevated myocardial infarction) (HCC) 05/2015   thelbert 05/23/2015 minor one   PAD (peripheral artery disease) (HCC)    PAF (paroxysmal atrial fibrillation) (HCC)    Tobacco abuse 05/21/2015   Type II diabetes mellitus (HCC)      Past Surgical History:   Procedure Laterality Date   ABDOMINAL AORTOGRAM W/LOWER EXTREMITY Bilateral 03/27/2020   Procedure: ABDOMINAL AORTOGRAM W/LOWER EXTREMITY;  Surgeon: Court Dorn PARAS, MD;  Location: [facility] INVASIVE CV LAB;  Service: Cardiovascular;  Laterality: Bilateral;   CARDIAC CATHETERIZATION  2000, 2005   less than 20% blockage   CARDIAC CATHETERIZATION N/A 05/22/2015   Procedure: Left Heart Cath and Coronary Angiography;  Surgeon: Candyce GORMAN Reek, MD;  Location: [facility] INVASIVE CV LAB;  Service: Cardiovascular;  Laterality: N/A;   CARDIAC CATHETERIZATION N/A 05/22/2015   Procedure: Coronary Stent Intervention;  Surgeon: Candyce GORMAN Reek, MD;  Location: [facility] INVASIVE CV LAB;  Service: Cardiovascular;  Laterality: N/A;   CARDIAC CATHETERIZATION N/A 09/04/2016   Procedure: Left Heart Cath and Coronary Angiography;  Surgeon: Candyce GORMAN Reek, MD;  Location: [facility] INVASIVE CV LAB;  Service: Cardiovascular;  Laterality: N/A;   CARDIAC CATHETERIZATION N/A 09/04/2016   Procedure: Coronary Balloon Angioplasty;  Surgeon: Candyce GORMAN Reek, MD;  Location: [facility] INVASIVE CV LAB;  Service: Cardiovascular;  Laterality: N/A;   CARDIAC CATHETERIZATION N/A 09/05/2016   Procedure: Coronary Atherectomy;  Surgeon: Alm LELON Clay, MD;  Location: [facility] INVASIVE CV LAB;  Service: Cardiovascular;  Laterality: N/A;   CARDIAC CATHETERIZATION N/A 09/05/2016   Procedure: Coronary Stent Intervention;  Surgeon: Alm LELON Clay, MD;  Location: [facility] INVASIVE CV LAB;  Service: Cardiovascular;  Laterality: N/A;   CARDIAC CATHETERIZATION N/A 09/05/2016   Procedure: Temporary Pacemaker;  Surgeon: Alm LELON Clay, MD;  Location:  [facility] INVASIVE CV LAB;  Service: Cardiovascular;  Laterality: N/A;   CARDIOVERSION N/A 10/13/2023   Procedure: CARDIOVERSION;  Surgeon: Zenaida Morene PARAS, MD;  Location: [facility] INVASIVE CV LAB;  Service: Cardiovascular;  Laterality: N/A;   CORONARY ANGIOPLASTY     CORONARY PRESSURE/FFR STUDY N/A 10/16/2018   Procedure: INTRAVASCULAR  PRESSURE WIRE/FFR STUDY;  Surgeon: Dann Candyce RAMAN, MD;  Location: [facility] INVASIVE CV LAB;  Service: Cardiovascular;  Laterality: N/A;   LEFT HEART CATH AND CORONARY ANGIOGRAPHY N/A 10/16/2018   Procedure: LEFT HEART CATH AND CORONARY ANGIOGRAPHY;  Surgeon: Dann Candyce RAMAN, MD;  Location: [facility] INVASIVE CV LAB;  Service: Cardiovascular;  Laterality: N/A;   MULTIPLE TOOTH EXTRACTIONS  2013   all of them   PERIPHERAL VASCULAR INTERVENTION  03/27/2020   Procedure: PERIPHERAL VASCULAR INTERVENTION;  Surgeon: Court Dorn PARAS, MD;  Location: [facility] INVASIVE CV LAB;  Service: Cardiovascular;;  Left iliac artery    TYMPANOSTOMY TUBE PLACEMENT Bilateral 1970s    Allergies and medications reviewed and updated.   Current Outpatient Medications:    albuterol  (VENTOLIN  HFA) 108 (90 Base) MCG/ACT inhaler, Inhale 1-2 puffs into the lungs every 6 (six) hours as needed for wheezing or shortness of breath., Disp: 10 each, Rfl: 6   apixaban  (ELIQUIS ) 5 MG TABS tablet, Take 1 tablet (5 mg total) by mouth 2 (two) times daily., Disp: 180 tablet, Rfl: 1   Blood Glucose Monitoring Suppl DEVI, 1 each by Does not apply route in the morning, at noon, and at bedtime. May substitute to any manufacturer covered by patient's insurance., Disp: 1 each, Rfl: 0   CINNAMON PO, Take 1,000 mg by mouth 2 (two) times daily., Disp: , Rfl:    clopidogrel  (PLAVIX ) 75 MG tablet, Take 1 tablet (75 mg total) by mouth daily., Disp: 90 tablet, Rfl: 3   dapagliflozin  propanediol (FARXIGA ) 5 MG TABS tablet, Take 1 tablet (5 mg total) by mouth daily., Disp: 30 tablet, Rfl: 11   dofetilide  (TIKOSYN ) 250 MCG capsule, Take 1 capsule (250 mcg total) by mouth 2 (two) times daily., Disp: 180 capsule, Rfl: 6   furosemide  (LASIX ) 40 MG tablet, Take 40 mg by mouth daily., Disp: , Rfl:    gabapentin  (NEURONTIN ) 100 MG capsule, Take 300 mg by mouth at bedtime., Disp: , Rfl:    glipiZIDE  (GLUCOTROL ) 10 MG tablet, TAKE 1 TABLET BY MOUTH TWICE DAILY BEFORE  A MEAL, Disp: 180 tablet, Rfl: 0   metFORMIN  (GLUCOPHAGE ) 1000 MG tablet, Take 1 tablet (1,000 mg total) by mouth 2 (two) times daily with a meal., Disp: 180 tablet, Rfl: 3   metoprolol  succinate (TOPROL -XL) 50 MG 24 hr tablet, Take 1 tablet (50 mg total) by mouth daily. Take with or immediately following a meal., Disp: 90 tablet, Rfl: 3   metoprolol  tartrate (LOPRESSOR ) 25 MG tablet, Take 1 tablet (25 mg total) by mouth 2 (two) times daily as needed (HR >100). (Patient taking differently: Take 50 mg by mouth 2 (two) times daily as needed (HR >100).), Disp: 60 tablet, Rfl: 11   nitroGLYCERIN  (NITROSTAT ) 0.4 MG SL tablet, Place 0.4 mg under the tongue every 5 (five) minutes as needed for chest pain., Disp: , Rfl:    Omega 3 1000 MG CAPS, Take 1,000 mg by mouth 2 (two) times daily., Disp: , Rfl:    rosuvastatin  (CRESTOR ) 20 MG tablet, Take 1 tablet (20 mg total) by mouth daily., Disp: 90 tablet, Rfl: 0   sacubitril -valsartan  (ENTRESTO ) 24-26 MG, TAKE 1 TABLET BY MOUTH TWICE  DAILY. START THIS ON THURSDAY 2/13, Disp: 60 tablet, Rfl: 11   [START ON 04/14/2024] Semaglutide , 1 MG/DOSE, 4 MG/3ML SOPN, Inject 1 mg as directed once a week., Disp: 3 mL, Rfl: 1   sildenafil  (VIAGRA ) 50 MG tablet, Take 1 tablet (50 mg total) by mouth daily as needed for erectile dysfunction., Disp: 10 tablet, Rfl: 6   spironolactone  (ALDACTONE ) 25 MG tablet, Take 1 tablet by mouth once daily, Disp: 30 tablet, Rfl: 11   Semaglutide ,0.25 or 0.5MG /DOS, (OZEMPIC , 0.25 OR 0.5 MG/DOSE,) 2 MG/1.5ML SOPN, Inject 0.5 mg into the skin once a week for 28 days., Disp: 1.5 mL, Rfl: 0  Allergies  Allergen Reactions   Atorvastatin  Other (See Comments)    Pt reports causes myalgias. Tolerating rosuvastatin  low dose    Objective:   BP 124/84   Pulse 68   Temp 98 F (36.7 C)   Ht 5' 10 (1.778 m)   Wt 254 lb 3.2 oz (115.3 kg)   SpO2 98%   BMI 36.47 kg/m      03/16/2024    3:06 PM 03/15/2024    8:57 AM 03/09/2024    8:19 AM  Vitals with  BMI  Height 5' 10  5' 10  Weight 254 lbs 3 oz 254 lbs 6 oz 254 lbs  BMI 36.47  36.45  Systolic 124 102 889  Diastolic 84 60 82  Pulse 68 47 91     Physical Exam Vitals and nursing note reviewed.  Constitutional:      Appearance: Normal appearance. He is obese.  HENT:     Head: Normocephalic and atraumatic.  Cardiovascular:     Rate and Rhythm: Normal rate and regular rhythm.     Pulses: Normal pulses.     Heart sounds: Normal heart sounds.  Pulmonary:     Effort: Pulmonary effort is normal.     Breath sounds: Normal breath sounds.  Skin:    General: Skin is warm and dry.     Capillary Refill: Capillary refill takes less than 2 seconds.  Neurological:     General: No focal deficit present.     Mental Status: He is alert and oriented to person, place, and time. Mental status is at baseline.  Psychiatric:        Mood and Affect: Mood normal.        Behavior: Behavior normal.        Thought Content: Thought content normal.        Judgment: Judgment normal.     Assessment & Plan:  Type 2 diabetes mellitus with complication, without long-term current use of insulin  (HCC) Assessment & Plan: A1c and uACR UTD. Foot exam UTD. Vaccines declined. Retinal eye exam utd. On metformin , glipizide , farxiga . Increase ozempic  to 0.5mg  weekly and increase as tolerated. Will push back dose from Thursdays to Fridays due to ablation. No history of pancreatitis and personal or family history of MEN2 or MTC. Admits very poor diet.  Recommend heart healthy diet such as Mediterranean diet with whole grains, fruits, vegetable, fish, lean meats, nuts, and olive oil. Limit salt. Encouraged moderate walking, 3-5 times/week for 30-50 minutes each session. Aim for at least 150 minutes.week. Goal should be pace of 3 miles/hours, or walking 1.5 miles in 30 minutes. Seek medical care for urinary frequency, extreme thirst, vision changes, lightheadedness, dizziness.  Follow up in 8 weeks or sooner if needed.     Other orders -     Ozempic  (0.25 or 0.5 MG/DOSE); Inject 0.5  mg into the skin once a week for 28 days.  Dispense: 1.5 mL; Refill: 0 -     Semaglutide  (1 MG/DOSE); Inject 1 mg as directed once a week.  Dispense: 3 mL; Refill: 1     Follow up plan: Return in about 2 months (around 05/26/2024) for with A1c on 10/8.  Jeoffrey GORMAN Barrio, FNP

## 2024-03-16 NOTE — Telephone Encounter (Signed)
 Outbound call placed to reschedule date of patient's upcoming physical and lab appointments; provider stated cpe not due until after 06/08/24.  Left message to return call.

## 2024-03-16 NOTE — Assessment & Plan Note (Signed)
 A1c and uACR UTD. Foot exam UTD. Vaccines declined. Retinal eye exam utd. On metformin , glipizide , farxiga . Increase ozempic  to 0.5mg  weekly and increase as tolerated. Will push back dose from Thursdays to Fridays due to ablation. No history of pancreatitis and personal or family history of MEN2 or MTC. Admits very poor diet.  Recommend heart healthy diet such as Mediterranean diet with whole grains, fruits, vegetable, fish, lean meats, nuts, and olive oil. Limit salt. Encouraged moderate walking, 3-5 times/week for 30-50 minutes each session. Aim for at least 150 minutes.week. Goal should be pace of 3 miles/hours, or walking 1.5 miles in 30 minutes. Seek medical care for urinary frequency, extreme thirst, vision changes, lightheadedness, dizziness.  Follow up in 8 weeks or sooner if needed.

## 2024-03-18 NOTE — Pre-Procedure Instructions (Signed)
 Attempted to call patient regarding procedure instructions.  Left voicemail on the following items: Arrival time 0800 Nothing to eat or drink after midnight No meds AM of procedure Responsible person to drive you home and stay with you for 24 hrs  Have you missed any doses of anti-coagulant Eliquis- should be taken twice a day, if you have missed any doses please let us know.  Don't take dose morning of procedure.

## 2024-03-19 ENCOUNTER — Ambulatory Visit (HOSPITAL_BASED_OUTPATIENT_CLINIC_OR_DEPARTMENT_OTHER): Payer: Self-pay | Admitting: Anesthesiology

## 2024-03-19 ENCOUNTER — Encounter (HOSPITAL_COMMUNITY)
Admission: RE | Disposition: A | Discharge status: Home / Self Care | Payer: Self-pay | Source: Home / Self Care | Attending: Cardiology

## 2024-03-19 ENCOUNTER — Ambulatory Visit (HOSPITAL_COMMUNITY)
Admission: RE | Admit: 2024-03-19 | Discharge: 2024-03-19 | Disposition: A | Discharge status: Home / Self Care | Attending: Cardiology | Admitting: Cardiology

## 2024-03-19 ENCOUNTER — Ambulatory Visit (HOSPITAL_COMMUNITY): Payer: Self-pay | Admitting: Anesthesiology

## 2024-03-19 ENCOUNTER — Other Ambulatory Visit: Payer: Self-pay

## 2024-03-19 ENCOUNTER — Other Ambulatory Visit (HOSPITAL_COMMUNITY): Payer: Self-pay

## 2024-03-19 ENCOUNTER — Encounter (HOSPITAL_COMMUNITY): Payer: Self-pay | Admitting: Cardiology

## 2024-03-19 DIAGNOSIS — I4811 Longstanding persistent atrial fibrillation: Secondary | ICD-10-CM | POA: Insufficient documentation

## 2024-03-19 DIAGNOSIS — I4819 Other persistent atrial fibrillation: Secondary | ICD-10-CM | POA: Diagnosis not present

## 2024-03-19 DIAGNOSIS — Z7901 Long term (current) use of anticoagulants: Secondary | ICD-10-CM | POA: Diagnosis not present

## 2024-03-19 DIAGNOSIS — E669 Obesity, unspecified: Secondary | ICD-10-CM | POA: Diagnosis not present

## 2024-03-19 DIAGNOSIS — E785 Hyperlipidemia, unspecified: Secondary | ICD-10-CM | POA: Diagnosis not present

## 2024-03-19 DIAGNOSIS — F17210 Nicotine dependence, cigarettes, uncomplicated: Secondary | ICD-10-CM | POA: Diagnosis not present

## 2024-03-19 DIAGNOSIS — Z79899 Other long term (current) drug therapy: Secondary | ICD-10-CM | POA: Insufficient documentation

## 2024-03-19 DIAGNOSIS — I10 Essential (primary) hypertension: Secondary | ICD-10-CM | POA: Diagnosis not present

## 2024-03-19 DIAGNOSIS — E1151 Type 2 diabetes mellitus with diabetic peripheral angiopathy without gangrene: Secondary | ICD-10-CM | POA: Diagnosis not present

## 2024-03-19 DIAGNOSIS — I110 Hypertensive heart disease with heart failure: Secondary | ICD-10-CM | POA: Insufficient documentation

## 2024-03-19 DIAGNOSIS — I2510 Atherosclerotic heart disease of native coronary artery without angina pectoris: Secondary | ICD-10-CM

## 2024-03-19 DIAGNOSIS — Z87891 Personal history of nicotine dependence: Secondary | ICD-10-CM | POA: Diagnosis not present

## 2024-03-19 DIAGNOSIS — Z955 Presence of coronary angioplasty implant and graft: Secondary | ICD-10-CM | POA: Insufficient documentation

## 2024-03-19 DIAGNOSIS — Z6836 Body mass index (BMI) 36.0-36.9, adult: Secondary | ICD-10-CM | POA: Insufficient documentation

## 2024-03-19 DIAGNOSIS — I5022 Chronic systolic (congestive) heart failure: Secondary | ICD-10-CM | POA: Insufficient documentation

## 2024-03-19 DIAGNOSIS — J449 Chronic obstructive pulmonary disease, unspecified: Secondary | ICD-10-CM | POA: Diagnosis not present

## 2024-03-19 DIAGNOSIS — I252 Old myocardial infarction: Secondary | ICD-10-CM | POA: Diagnosis not present

## 2024-03-19 DIAGNOSIS — I4891 Unspecified atrial fibrillation: Secondary | ICD-10-CM

## 2024-03-19 HISTORY — PX: ATRIAL FIBRILLATION ABLATION: EP1191

## 2024-03-19 LAB — GLUCOSE, CAPILLARY
Glucose-Capillary: 118 mg/dL — ABNORMAL HIGH (ref 70–99)
Glucose-Capillary: 105 mg/dL — ABNORMAL HIGH (ref 70–99)

## 2024-03-19 LAB — POCT ACTIVATED CLOTTING TIME: Activated Clotting Time: 360 s

## 2024-03-19 MED ORDER — SODIUM CHLORIDE 0.9 % IV SOLN: 250.0000 mL | INTRAVENOUS | Status: DC | PRN

## 2024-03-19 MED ORDER — PHENYLEPHRINE 80 MCG/ML (10ML) SYRINGE FOR IV PUSH (FOR BLOOD PRESSURE SUPPORT)
PREFILLED_SYRINGE | INTRAVENOUS | Status: DC | PRN
Start: 2024-03-19 — End: 2024-03-19
  Administered 2024-03-19 (×2): 160 ug via INTRAVENOUS

## 2024-03-19 MED ORDER — SUGAMMADEX SODIUM 200 MG/2ML IV SOLN
INTRAVENOUS | Status: DC | PRN
  Administered 2024-03-19: 300 mg via INTRAVENOUS

## 2024-03-19 MED ORDER — SODIUM CHLORIDE 0.9% FLUSH: 3.0000 mL | INTRAVENOUS | Status: DC | PRN

## 2024-03-19 MED ORDER — MIDAZOLAM HCL 2 MG/2ML IJ SOLN
INTRAMUSCULAR | Status: DC | PRN
  Administered 2024-03-19: 2 mg via INTRAVENOUS

## 2024-03-19 MED ORDER — PANTOPRAZOLE SODIUM 40 MG PO TBEC
40.0000 mg | DELAYED_RELEASE_TABLET | Freq: Every day | ORAL | Status: DC
  Administered 2024-03-19: 40 mg via ORAL
  Filled 2024-03-19: qty 1

## 2024-03-19 MED ORDER — COLCHICINE 0.6 MG PO TABS
0.6000 mg | ORAL_TABLET | Freq: Two times a day (BID) | ORAL | Status: DC
  Administered 2024-03-19: 0.6 mg via ORAL
  Filled 2024-03-19: qty 1

## 2024-03-19 MED ORDER — APIXABAN 5 MG PO TABS
5.0000 mg | ORAL_TABLET | Freq: Two times a day (BID) | ORAL | Status: DC
  Administered 2024-03-19: 5 mg via ORAL
  Filled 2024-03-19: qty 1

## 2024-03-19 MED ORDER — HEPARIN (PORCINE) IN NACL 1000-0.9 UT/500ML-% IV SOLN
INTRAVENOUS | Status: DC | PRN
  Administered 2024-03-19 (×3): 500 mL

## 2024-03-19 MED ORDER — FENTANYL CITRATE (PF) 250 MCG/5ML IJ SOLN
INTRAMUSCULAR | Status: DC | PRN
  Administered 2024-03-19 (×2): 50 ug via INTRAVENOUS

## 2024-03-19 MED ORDER — SODIUM CHLORIDE 0.9% FLUSH: 3.0000 mL | Freq: Two times a day (BID) | INTRAVENOUS | Status: DC

## 2024-03-19 MED ORDER — LIDOCAINE 2% (20 MG/ML) 5 ML SYRINGE
INTRAMUSCULAR | Status: DC | PRN
  Administered 2024-03-19: 100 mg via INTRAVENOUS

## 2024-03-19 MED ORDER — PROPOFOL 10 MG/ML IV BOLUS
INTRAVENOUS | Status: DC | PRN
  Administered 2024-03-19: 200 mg via INTRAVENOUS

## 2024-03-19 MED ORDER — SODIUM CHLORIDE 0.9 % IV SOLN: INTRAVENOUS | Status: DC

## 2024-03-19 MED ORDER — DEXAMETHASONE SODIUM PHOSPHATE 10 MG/ML IJ SOLN
INTRAMUSCULAR | Status: DC | PRN
  Administered 2024-03-19: 10 mg via INTRAVENOUS

## 2024-03-19 MED ORDER — ONDANSETRON HCL 4 MG/2ML IJ SOLN: 4.0000 mg | Freq: Four times a day (QID) | INTRAMUSCULAR | Status: DC | PRN

## 2024-03-19 MED ORDER — ATROPINE SULFATE 1 MG/10ML IJ SOSY
PREFILLED_SYRINGE | INTRAMUSCULAR | Status: DC | PRN
  Administered 2024-03-19: 1 mg via INTRAVENOUS

## 2024-03-19 MED ORDER — PROTAMINE SULFATE 10 MG/ML IV SOLN
INTRAVENOUS | Status: DC | PRN
  Administered 2024-03-19: 35 mg via INTRAVENOUS

## 2024-03-19 MED ORDER — COLCHICINE 0.6 MG PO TABS
0.6000 mg | ORAL_TABLET | Freq: Two times a day (BID) | ORAL | 0 refills | Status: DC
  Filled 2024-03-19: qty 10, 5d supply, fill #0

## 2024-03-19 MED ORDER — ONDANSETRON HCL 4 MG/2ML IJ SOLN
INTRAMUSCULAR | Status: DC | PRN
  Administered 2024-03-19: 4 mg via INTRAVENOUS

## 2024-03-19 MED ORDER — PANTOPRAZOLE SODIUM 40 MG PO TBEC
40.0000 mg | DELAYED_RELEASE_TABLET | Freq: Every day | ORAL | 0 refills | Status: AC
  Filled 2024-03-19: qty 45, 45d supply, fill #0

## 2024-03-19 MED ORDER — ACETAMINOPHEN 325 MG PO TABS: 650.0000 mg | ORAL_TABLET | ORAL | Status: DC | PRN

## 2024-03-19 MED ORDER — PHENYLEPHRINE HCL-NACL 20-0.9 MG/250ML-% IV SOLN
INTRAVENOUS | Status: DC | PRN
  Administered 2024-03-19: 30 ug/min via INTRAVENOUS

## 2024-03-19 MED ORDER — ROCURONIUM BROMIDE 10 MG/ML (PF) SYRINGE
PREFILLED_SYRINGE | INTRAVENOUS | Status: DC | PRN
  Administered 2024-03-19: 70 mg via INTRAVENOUS
  Administered 2024-03-19: 10 mg via INTRAVENOUS

## 2024-03-19 MED ORDER — HEPARIN SODIUM (PORCINE) 1000 UNIT/ML IJ SOLN
INTRAMUSCULAR | Status: DC | PRN
Start: 2024-03-19 — End: 2024-03-19
  Administered 2024-03-19: 17000 [IU] via INTRAVENOUS

## 2024-03-19 NOTE — Anesthesia Procedure Notes (Signed)
 Procedure Name: Intubation Date/Time: 03/19/2024 9:45 AM  Performed by: Delores Duwaine SAUNDERS, CRNAPre-anesthesia Checklist: Patient identified, Emergency Drugs available, Suction available and Patient being monitored Patient Re-evaluated:Patient Re-evaluated prior to induction Oxygen Delivery Method: Circle System Utilized Preoxygenation: Pre-oxygenation with 100% oxygen Induction Type: IV induction Ventilation: Mask ventilation without difficulty Laryngoscope Size: Mac and 4 Grade View: Grade I Tube type: Oral Tube size: 7.5 mm Number of attempts: 1 Airway Equipment and Method: Stylet and Oral airway Placement Confirmation: ETT inserted through vocal cords under direct vision, positive ETCO2 and breath sounds checked- equal and bilateral Secured at: 23 cm Tube secured with: Tape Dental Injury: Teeth and Oropharynx as per pre-operative assessment

## 2024-03-19 NOTE — H&P (Signed)
 Electrophysiology Office Note:     Date:  03/19/2024    ID:  Ian [name], DOB May 19, 1971, MRN [id number]   [facility] Cardiologist:  Ian Lesches, MD  [name] Electrophysiologist:  Ian ONEIDA HOLTS, MD    Referring MD: Ian Morene PARAS, MD    Chief Complaint: Atrial fibrillation   History of Present Illness:     Ian [name] is a 53 year old man who I am seeing today for an evaluation of atrial fibrillation at the request of Dr. Zenaida.  The patient has a history of coronary artery disease, longstanding persistent atrial fibrillation, chronic systolic heart failure.  He has a history of a PCI to the right coronary after NSTEMI in 2016.  Atrial fibrillation was diagnosed around the time of his NSTEMI and he was started on Eliquis .  He does not remember ever being on Tikosyn  for his atrial fibrillation.  He tells me that he was never hospitalized for drug initiation.SABRA  He did have a cardioversion October 13, 2023 for his atrial fibrillation.  He felt better after the cardioversion but unfortunately he went back into atrial fibrillation.   Given a reduced ejection fraction, he is referred to discuss rhythm control options.   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  He takes his Eliquis  twice daily.  He felt better when he was back in normal rhythm after the cardioversion.  Presents for AF ablation. Procedure reviewed.   Objective Their past medical, social and family history was reviewed.     ROS:   Please see the history of present illness.    All other systems reviewed and are negative.   EKGs/Labs/Other Studies Reviewed:     The following studies were reviewed today:   Dec 16, 2023 echo EF 40 to 45% RV normal Moderate MAC     November 18, 2023 EKG shows atrial fibrillation.  Ventricular rate 85 bpm.  Narrow QRS.   October 13, 2023 EKG shows sinus rhythm, first-degree AV delay, ventricular rate 47 bpm.  QTc 417  ms.            Physical Exam:     VS:  BP 120/72   Pulse 68   Ht 5' 10 (1.778 m)   Wt 264 lb 8 oz (120 kg)   SpO2 95%   BMI 37.95 kg/m         Wt Readings from Last 3 Encounters:  12/24/23 264 lb 8 oz (120 kg)  12/23/23 268 lb (121.6 kg)  11/18/23 264 lb 6.4 oz (119.9 kg)      GEN: no distress.  Obese CARD: Irregularly irregular, No MRG RESP: No IWOB. CTAB.         Assessment ASSESSMENT AND PLAN:     1. Chronic systolic heart failure (HCC)   2. Persistent atrial fibrillation (HCC)       #Persistent atrial fibrillation I suspect he has longstanding persistent atrial fibrillation.  It is associated with a reduced left ventricular function.  Rhythm control is indicated.  I discussed rhythm control options.  Given the chronicity of his atrial fibrillation I suspect we will need both an antiarrhythmic drug and catheter ablation.   I have recommended loading with amiodarone as a bridge to catheter ablation.  I discussed the catheter ablation procedure in detail the patient including the risks, recovery and likelihood of success.    Discussed treatment options today for AF including antiarrhythmic drug therapy and ablation. Discussed  risks, recovery and likelihood of success with each treatment strategy. Risk, benefits, and alternatives to EP study and ablation for afib were discussed. These risks include but are not limited to stroke, bleeding, vascular damage, tamponade, perforation, damage to the esophagus, lungs, phrenic nerve and other structures, pulmonary vein stenosis, worsening renal function, coronary vasospasm and death.  Discussed potential need for repeat ablation procedures and antiarrhythmic drugs after an initial ablation. The patient understands these risk and wishes to proceed.  We will therefore proceed with catheter ablation at the next available time.  Carto, ICE, anesthesia are requested for the procedure.  Will also obtain CT PV protocol prior to the procedure  to exclude LAA thrombus and further evaluate atrial anatomy.   Continue Eliquis  for stroke prophylaxis   From my review of his records I do not have evidence that he ever was on Tikosyn .  He confirms that he never was hospitalized to start a drug.  I discussed Tikosyn  including its risk during today's office visit he is interested in proceeding as a bridge to catheter ablation.  His QTc is about 420 in sinus rhythm and his kidney function is okay.  I do not see any contraindicated medications.   #Chronic systolic heart failure EF 40 to 45% Follows with Dr. Zenaida in the heart failure clinic NYHA class II-III Continue spironolactone , Entresto , metoprolol , Lasix , Farxiga      Presents for AF ablation. Procedure reviewed.     Signed, Ian [name]. Cindie, MD, Ian [name], Ian [name] 03/19/2024 Electrophysiology [facility]

## 2024-03-19 NOTE — Transfer of Care (Signed)
 Immediate Anesthesia Transfer of Care Note  Patient: Ian [name]  Procedure(s) Performed: ATRIAL FIBRILLATION ABLATION  Patient Location: PACU  Anesthesia Type:General  Level of Consciousness: awake and alert   Airway & Oxygen Therapy: Patient Spontanous Breathing  Post-op Assessment: Report given to RN  Post vital signs: Reviewed and stable  Last Vitals:  Vitals Value Taken Time  BP 120/77 03/19/24 11:15  Temp 36.6 C 03/19/24 11:12  Pulse 68 03/19/24 11:16  Resp 12 03/19/24 11:16  SpO2 91 % 03/19/24 11:16  Vitals shown include unfiled device data.  Last Pain:  Vitals:   03/19/24 1112  TempSrc: Oral         Complications: No notable events documented.

## 2024-03-19 NOTE — Discharge Instructions (Signed)

## 2024-03-19 NOTE — Anesthesia Postprocedure Evaluation (Signed)
 Anesthesia Post Note  Patient: Ian [name]  Procedure(s) Performed: ATRIAL FIBRILLATION ABLATION     Patient location during evaluation: PACU Anesthesia Type: General Level of consciousness: awake and alert Pain management: pain level controlled Vital Signs Assessment: post-procedure vital signs reviewed and stable Respiratory status: spontaneous breathing, nonlabored ventilation, respiratory function stable and patient connected to nasal cannula oxygen Cardiovascular status: blood pressure returned to baseline and stable Postop Assessment: no apparent nausea or vomiting Anesthetic complications: no   No notable events documented.  Last Vitals:  Vitals:   03/19/24 1112 03/19/24 1130  BP: 114/75   Pulse: 68   Resp: 13   Temp: 36.6 C 36.6 C  SpO2: 90%     Last Pain:  Vitals:   03/19/24 1112  TempSrc: Oral                 Ichael Pullara

## 2024-03-19 NOTE — Anesthesia Preprocedure Evaluation (Signed)
 Anesthesia Evaluation  Patient identified by MRN, date of birth, ID band Patient awake    Reviewed: Allergy & Precautions, NPO status , Patient's Chart, lab work & pertinent test results, reviewed documented beta blocker date and time   History of Anesthesia Complications (+) PROLONGED EMERGENCE and history of anesthetic complications  Airway Mallampati: III  TM Distance: >3 FB     Dental  (+) Edentulous Upper, Edentulous Lower   Pulmonary COPD,  COPD inhaler, Current Smoker and Patient abstained from smoking.   breath sounds clear to auscultation + decreased breath sounds      Cardiovascular hypertension, Pt. on medications and Pt. on home beta blockers + CAD, + Past MI, + Cardiac Stents and + Peripheral Vascular Disease  + dysrhythmias Atrial Fibrillation  Rhythm:Irregular Rate:Normal  NSTEMI 05/2015 Multiple PCI RCA  Cardiac Cath 10/16/18  Previously placed Prox RCA to Mid RCA drug eluting stents are widely patent, with only mild restenosis.  Mid Cx lesion is 50% stenosed. Not significant by DFR.  Prox LAD to Mid LAD lesion is 25% stenosed.  LV end diastolic pressure is normal.  There is no aortic valve stenosis.     Echo 07/18/23  1. Left ventricular ejection fraction, by estimation, is 35 to 40%. The  left ventricle has moderately decreased function. The left ventricle  demonstrates global hypokinesis. There is mild left ventricular  hypertrophy. Left ventricular diastolic  parameters are indeterminate.   2. Right ventricular systolic function is normal. The right ventricular  size is mildly enlarged. Tricuspid regurgitation signal is inadequate for  assessing PA pressure.   3. The mitral valve is normal in structure. Trivial mitral valve  regurgitation. No evidence of mitral stenosis.   4. The aortic valve was not well visualized. Aortic valve regurgitation  is not visualized. No aortic stenosis is present.   5.  The inferior vena cava is normal in size with greater than 50%  respiratory variability, suggesting right atrial pressure of 3 mmHg.   EKG 09/23/23 A. Fib with RVR, RAD   Neuro/Psych negative neurological ROS  negative psych ROS   GI/Hepatic negative GI ROS, Neg liver ROS,,,  Endo/Other  diabetes, Well Controlled, Type 2, Oral Hypoglycemic Agents  HLD Obesity  Renal/GU negative Renal ROS  negative genitourinary   Musculoskeletal  (+) Arthritis , Osteoarthritis,    Abdominal  (+) + obese  Peds  Hematology Eliquis  therapy- last dose this am   Anesthesia Other Findings   Reproductive/Obstetrics                              Anesthesia Physical Anesthesia Plan  ASA: 3  Anesthesia Plan: General   Post-op Pain Management:    Induction: Intravenous  PONV Risk Score and Plan: 1 and Treatment may vary due to age or medical condition and Propofol  infusion  Airway Management Planned: Oral ETT  Additional Equipment: None  Intra-op Plan:   Post-operative Plan: Extubation in OR  Informed Consent: I have reviewed the patients History and Physical, chart, labs and discussed the procedure including the risks, benefits and alternatives for the proposed anesthesia with the patient or authorized representative who has indicated his/her understanding and acceptance.       Plan Discussed with: CRNA and Anesthesiologist  Anesthesia Plan Comments: (  )        Anesthesia Quick Evaluation

## 2024-03-22 ENCOUNTER — Encounter: Payer: Self-pay | Admitting: Emergency Medicine

## 2024-03-22 ENCOUNTER — Telehealth (HOSPITAL_COMMUNITY): Payer: Self-pay

## 2024-03-22 NOTE — Telephone Encounter (Signed)
 Attempted to reach patient to follow up with procedure completed on 03/19/24, no answer. Left VM for patient to return call.

## 2024-03-23 ENCOUNTER — Encounter: Admitting: Family Medicine

## 2024-03-23 DIAGNOSIS — H5213 Myopia, bilateral: Secondary | ICD-10-CM | POA: Diagnosis not present

## 2024-03-30 DIAGNOSIS — G4733 Obstructive sleep apnea (adult) (pediatric): Secondary | ICD-10-CM | POA: Diagnosis not present

## 2024-04-01 ENCOUNTER — Other Ambulatory Visit: Payer: Self-pay | Admitting: Family Medicine

## 2024-04-04 ENCOUNTER — Other Ambulatory Visit (HOSPITAL_COMMUNITY): Payer: Self-pay | Admitting: Cardiology

## 2024-04-09 DIAGNOSIS — G4733 Obstructive sleep apnea (adult) (pediatric): Secondary | ICD-10-CM | POA: Diagnosis not present

## 2024-04-12 DIAGNOSIS — I5022 Chronic systolic (congestive) heart failure: Secondary | ICD-10-CM | POA: Diagnosis not present

## 2024-04-12 DIAGNOSIS — G4733 Obstructive sleep apnea (adult) (pediatric): Secondary | ICD-10-CM | POA: Diagnosis not present

## 2024-04-15 ENCOUNTER — Other Ambulatory Visit: Payer: Self-pay | Admitting: Family Medicine

## 2024-04-16 ENCOUNTER — Ambulatory Visit (HOSPITAL_COMMUNITY): Admitting: Internal Medicine

## 2024-04-19 ENCOUNTER — Ambulatory Visit (INDEPENDENT_AMBULATORY_CARE_PROVIDER_SITE_OTHER): Admitting: Cardiovascular Disease

## 2024-04-19 ENCOUNTER — Encounter: Payer: Self-pay | Admitting: Cardiovascular Disease

## 2024-04-19 ENCOUNTER — Encounter (HOSPITAL_COMMUNITY): Payer: Self-pay | Admitting: Internal Medicine

## 2024-04-19 ENCOUNTER — Ambulatory Visit
Admission: RE | Admit: 2024-04-19 | Discharge: 2024-04-19 | Disposition: A | Discharge status: Home / Self Care | Source: Ambulatory Visit | Attending: Internal Medicine | Admitting: Internal Medicine

## 2024-04-19 VITALS — BP 129/84 | HR 63 | Ht 70.0 in | Wt 246.4 lb

## 2024-04-19 VITALS — BP 129/84 | HR 65 | Ht 70.0 in | Wt 246.4 lb

## 2024-04-19 DIAGNOSIS — I10 Essential (primary) hypertension: Secondary | ICD-10-CM | POA: Insufficient documentation

## 2024-04-19 DIAGNOSIS — Z7901 Long term (current) use of anticoagulants: Secondary | ICD-10-CM | POA: Diagnosis not present

## 2024-04-19 DIAGNOSIS — I480 Paroxysmal atrial fibrillation: Secondary | ICD-10-CM

## 2024-04-19 DIAGNOSIS — Z720 Tobacco use: Secondary | ICD-10-CM

## 2024-04-19 DIAGNOSIS — Z5181 Encounter for therapeutic drug level monitoring: Secondary | ICD-10-CM | POA: Diagnosis not present

## 2024-04-19 DIAGNOSIS — E785 Hyperlipidemia, unspecified: Secondary | ICD-10-CM | POA: Insufficient documentation

## 2024-04-19 DIAGNOSIS — I2583 Coronary atherosclerosis due to lipid rich plaque: Secondary | ICD-10-CM | POA: Diagnosis not present

## 2024-04-19 DIAGNOSIS — I4811 Longstanding persistent atrial fibrillation: Secondary | ICD-10-CM | POA: Insufficient documentation

## 2024-04-19 DIAGNOSIS — Z955 Presence of coronary angioplasty implant and graft: Secondary | ICD-10-CM | POA: Insufficient documentation

## 2024-04-19 DIAGNOSIS — I2584 Coronary atherosclerosis due to calcified coronary lesion: Secondary | ICD-10-CM | POA: Insufficient documentation

## 2024-04-19 DIAGNOSIS — I255 Ischemic cardiomyopathy: Secondary | ICD-10-CM

## 2024-04-19 DIAGNOSIS — I2510 Atherosclerotic heart disease of native coronary artery without angina pectoris: Secondary | ICD-10-CM | POA: Diagnosis not present

## 2024-04-19 DIAGNOSIS — Z79899 Other long term (current) drug therapy: Secondary | ICD-10-CM | POA: Insufficient documentation

## 2024-04-19 DIAGNOSIS — Z7902 Long term (current) use of antithrombotics/antiplatelets: Secondary | ICD-10-CM | POA: Diagnosis not present

## 2024-04-19 DIAGNOSIS — Z7984 Long term (current) use of oral hypoglycemic drugs: Secondary | ICD-10-CM | POA: Insufficient documentation

## 2024-04-19 DIAGNOSIS — E782 Mixed hyperlipidemia: Secondary | ICD-10-CM

## 2024-04-19 DIAGNOSIS — E119 Type 2 diabetes mellitus without complications: Secondary | ICD-10-CM | POA: Diagnosis not present

## 2024-04-19 DIAGNOSIS — I739 Peripheral vascular disease, unspecified: Secondary | ICD-10-CM

## 2024-04-19 DIAGNOSIS — F17210 Nicotine dependence, cigarettes, uncomplicated: Secondary | ICD-10-CM | POA: Diagnosis not present

## 2024-04-19 NOTE — Assessment & Plan Note (Signed)
 History of hyperlipidemia on statin therapy with lipid profile performed 02/17/2024 revealing a total cholesterol of I 13, LDL 48 and HDL 32.

## 2024-04-19 NOTE — Assessment & Plan Note (Signed)
 History of CAD status post multiple coronary interventions dating back to 2016 when he had RCA stenting.  He had RCA orbital atherectomy and restenting 09/05/2016.  Myocardial perfusion study performed 07/18/2023 was nonischemic.  He denies chest pain.

## 2024-04-19 NOTE — Assessment & Plan Note (Signed)
 History of ischemic/nonischemic cardiomyopathy on GDMT followed by Dr. Zenaida.  His most recent 2D echo performed 12/12/2023 revealed an EF of 40 to 45% representing slight improvement compared to his prior echo.  He denies shortness of breath.  It certainly possible that his A-fib in the past has contributed to his LV dysfunction.  Hopefully this will be rechecked in the near future status post afibrillation.

## 2024-04-19 NOTE — Assessment & Plan Note (Signed)
 History of PAF maintaining sinus rhythm on Eliquis , Tikosyn  and recent A-fib ablation by Dr. Cindie 03/19/2024 maintaining sinus rhythm.

## 2024-04-19 NOTE — Progress Notes (Signed)
 04/19/2024 Ian [name]   02/12/1971  [id number]  Primary Physician Kayla Jeoffrey RAMAN, FNP Primary Cardiologist: Dorn JINNY Lesches MD FACP, [place], [place], [place]  HPI:  Ian [name] is a 53 y.o.   moderately overweight separated Caucasian male father of 3 children, grandfather of 3 grandchildren who I last saw in the office 07/28/2023.  He was referred by Dr. Maranda, his cardiologist, for peripheral vascular evaluation because of lifestyle imaging claudication.  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  He has treated diabetes and hyperlipidemia.  He also has PAF on Eliquis .  His family history is remarkable for father who apparently had CAD.  He has had multiple coronary interventions dating back to 2016 when he had RCA stent.  He had orbital atherectomy his RCA restenting 09/05/2016.  He has had claudication for 4 years primarily on the left side with recent Doppler studies performed 03/06/2020 revealing a right ABI that was normal and a left of 0.53 with an occluded left common iliac artery.   I performed peripheral angiography on him 03/27/2020 revealing an occluded left common iliac artery which I was able to cross and stented with a VBX covered stent.  He was discharged home the following day.  His Dopplers performed 8/21 unfortunately, he does continue to smoke however.   I referred him to Dr. Zenaida for treatment of his cardiomyopathy for medication titration GDMT.  He underwent attempted DC cardioversion which was initially successful but reverted back to A-fib 1 week later.  He was sent to Dr. Cindie today for discussion about treatment options, rhythm versus rate control.  It was decided to pursue rhythm control with Tikosyn  bridge to ablation.  He had admission for  Tikosyn  loading which was successful in converting him to sinus rhythm.  He underwent A-fib ablation successfully by Dr. Cindie 1 month ago.  Since I saw him 4 months ago he still complains of lifestyle-limiting left lower extremity claudication.  His lower extremity arterial Doppler studies performed 07/04/2023 suggested occlusion of his left common iliac artery stent.  I am going to update his Doppler studies.  Talk to him about the importance of smoking cessation prior to his procedure.  We will schedule this in the next 3 to 4 weeks.   Current Meds  Medication Sig   albuterol  (VENTOLIN  HFA) 108 (90 Base) MCG/ACT inhaler Inhale 1-2 puffs into the lungs every 6 (six) hours as needed for wheezing or shortness of breath.   Blood Glucose Monitoring Suppl DEVI 1 each by Does not apply route in the morning, at noon, and at bedtime. May substitute to any manufacturer covered by patient's insurance.   CINNAMON PO Take 1,000 mg by mouth 2 (two) times daily.   clopidogrel  (PLAVIX ) 75 MG tablet Take 1 tablet (75 mg total) by mouth daily.   colchicine  0.6 MG tablet Take 1 tablet (0.6 mg total) by mouth 2 (two) times daily for 5 days.   dapagliflozin  propanediol (FARXIGA ) 5 MG TABS tablet Take 1 tablet (5 mg total) by mouth daily.   dofetilide  (TIKOSYN ) 250 MCG capsule Take 1 capsule (250 mcg total) by mouth 2 (two) times daily.   ELIQUIS  5 MG TABS tablet Take 1 tablet by mouth twice daily   furosemide  (LASIX ) 40 MG tablet  Take 40 mg by mouth daily.   gabapentin  (NEURONTIN ) 100 MG capsule Take 300 mg by mouth at bedtime.   glipiZIDE  (GLUCOTROL ) 10 MG tablet TAKE 1 TABLET BY MOUTH TWICE DAILY BEFORE A MEAL   metFORMIN  (GLUCOPHAGE ) 1000 MG tablet Take 1 tablet (1,000 mg total) by mouth 2 (two) times daily with a meal.   metoprolol  succinate (TOPROL -XL) 50 MG 24 hr tablet Take 1 tablet (50 mg total) by mouth daily. Take with or immediately following a meal.   nitroGLYCERIN  (NITROSTAT ) 0.4 MG SL tablet Place  0.4 mg under the tongue every 5 (five) minutes as needed for chest pain.   Omega 3 1000 MG CAPS Take 1,000 mg by mouth 2 (two) times daily.   pantoprazole  (PROTONIX ) 40 MG tablet Take 1 tablet (40 mg total) by mouth daily.   rosuvastatin  (CRESTOR ) 20 MG tablet Take 1 tablet (20 mg total) by mouth daily.   sacubitril -valsartan  (ENTRESTO ) 24-26 MG TAKE 1 TABLET BY MOUTH TWICE DAILY. START THIS ON THURSDAY 2/13   Semaglutide , 1 MG/DOSE, 4 MG/3ML SOPN Inject 1 mg as directed once a week.   sildenafil  (VIAGRA ) 50 MG tablet Take 1 tablet (50 mg total) by mouth daily as needed for erectile dysfunction.   spironolactone  (ALDACTONE ) 25 MG tablet Take 1 tablet by mouth once daily     Allergies  Allergen Reactions   Atorvastatin  Other (See Comments)    Pt reports causes myalgias. Tolerating rosuvastatin  low dose    Social History   Socioeconomic History   Marital status: Divorced    Spouse name: Not on file   Number of children: 3   Years of education: Not on file   Highest education level: Not on file  Occupational History   Not on file  Tobacco Use   Smoking status: Every Day    Current packs/day: 0.50    Average packs/day: 0.5 packs/day for 35.0 years (17.5 ttl pk-yrs)    Types: Cigarettes   Smokeless tobacco: Never   Tobacco comments:    04/19/2024 Patient smokes a half of pack daily     Currently smoked .5ppd 02/11/24  Vaping Use   Vaping status: Every Day   Substances: Nicotine   Substance and Sexual Activity   Alcohol use: Not Currently    Comment: 2025.....socially   Drug use: Not Currently    Types: Cocaine, Marijuana    Comment: 09/04/2016 haven't touched drugs since 1988   Sexual activity: Yes  Other Topics Concern   Not on file  Social History Narrative   Employed as a Naval architect. Resides in [place]  and [place]       2 stents to heart 2016, 2017   1 stent to LLE 2021   Social Drivers of Health   Financial Resource Strain: Not on file  Food Insecurity: No Food  Insecurity (02/03/2024)   Hunger Vital Sign    Worried About Running Out of Food in the Last Year: Never true    Ran Out of Food in the Last Year: Never true  Transportation Needs: No Transportation Needs (02/03/2024)   PRAPARE - Administrator, Civil Service (Medical): No    Lack of Transportation (Non-Medical): No  Physical Activity: Not on file  Stress: Not on file  Social Connections: Not on file  Intimate Partner Violence: Not At Risk (02/03/2024)   Humiliation, Afraid, Rape, and Kick questionnaire    Fear of Current or Ex-Partner: No    Emotionally Abused: No    Physically Abused: No  Sexually Abused: No     Review of Systems: General: negative for chills, fever, night sweats or weight changes.  Cardiovascular: negative for chest pain, dyspnea on exertion, edema, orthopnea, palpitations, paroxysmal nocturnal dyspnea or shortness of breath Dermatological: negative for rash Respiratory: negative for cough or wheezing Urologic: negative for hematuria Abdominal: negative for nausea, vomiting, diarrhea, bright red blood per rectum, melena, or hematemesis Neurologic: negative for visual changes, syncope, or dizziness All other systems reviewed and are otherwise negative except as noted above.    Blood pressure 129/84, pulse 65, height 5' 10 (1.778 m), weight 246 lb 6.4 oz (111.8 kg), SpO2 99%.  General appearance: alert and no distress Neck: no adenopathy, no carotid bruit, no JVD, supple, symmetrical, trachea midline, and thyroid not enlarged, symmetric, no tenderness/mass/nodules Lungs: clear to auscultation bilaterally Heart: regular rate and rhythm, S1, S2 normal, no murmur, click, rub or gallop Extremities: extremities normal, atraumatic, no cyanosis or edema Pulses: 2+ and symmetric Absent left pedal pulse Skin: Skin color, texture, turgor normal. No rashes or lesions Neurologic: Grossly normal  EKG not performed today      ASSESSMENT AND PLAN:    Hyperlipidemia History of hyperlipidemia on statin therapy with lipid profile performed 02/17/2024 revealing a total cholesterol of I 13, LDL 48 and HDL 32.  Tobacco abuse Ongoing tobacco abuse of 1-1/2 packs a day down to 1/2 pack a day.  We talked about the importance of smoking cessation especially in light of potential repeat lower extremity revascularization.  PAF (paroxysmal atrial fibrillation) (HCC) History of PAF maintaining sinus rhythm on Eliquis , Tikosyn  and recent A-fib ablation by Dr. Cindie 03/19/2024 maintaining sinus rhythm.  Essential hypertension History of essential hypertension blood pressure measured today at 129/84.  He is on metoprolol  and Entresto .  Coronary artery disease due to lipid rich plaque History of CAD status post multiple coronary interventions dating back to 2016 when he had RCA stenting.  He had RCA orbital atherectomy and restenting 09/05/2016.  Myocardial perfusion study performed 07/18/2023 was nonischemic.  He denies chest pain.  Ischemic cardiomyopathy History of ischemic/nonischemic cardiomyopathy on GDMT followed by Dr. Zenaida.  His most recent 2D echo performed 12/12/2023 revealed an EF of 40 to 45% representing slight improvement compared to his prior echo.  He denies shortness of breath.  It certainly possible that his A-fib in the past has contributed to his LV dysfunction.  Hopefully this will be rechecked in the near future status post afibrillation.  Peripheral arterial disease (HCC) History of PAD status post left common iliac artery PTA and covered stenting by myself 04/06/2020 using an 8 mm x 29 mm long VBX covered stent.  We did have a 75% calcified left common femoral artery stenosis at the time.  Has had progressive lifestyle-limiting claudication with his most recent Dopplers performed 07/04/2023 suggesting occlusion of his previously placed stent.  Patient wishes to have this re  intervened on.     Dorn DOROTHA Lesches MD [facility],  [facility] 04/19/2024 9:16 AM

## 2024-04-19 NOTE — Progress Notes (Signed)
 Primary Care Physician: Kayla Jeoffrey RAMAN, FNP Primary Cardiologist: Dorn Lesches, MD Electrophysiologist: [name], MD     Referring Physician: Dr. HOLTS Banks Murri is a 53 y.o. male with a history of CAD, chronic systolic CHF, HLD, HTN, PAD, and (longstanding persistent suspected) atrial fibrillation who presents for consultation in the [facility]. Patient is on Eliquis  5 mg BID for a CHADS2VASC score of 3.  On follow up 02/11/24, patient is here for 1 week Tikosyn  surveillance. He is in Afib with RVR and noted it came on last night before his evening dose of Tikosyn . His watch notifies him of heart rate because he does not have cardiac awareness. S/p Tikosyn  admission 6/24-27/2025. Converted chemically to NSR and did not require cardioversion. Qtc remained stable. Patient noted to go into Afib last night. No missed doses of Tikosyn  or Eliquis .   On follow up 04/19/24, patient is currently in NSR. S/p Afib ablation on 03/19/24 by Dr. HOLTS. No episodes of Afib since ablation. He is taking Tikosyn  250 mcg BID. No chest pain or SOB. Leg sites healed without issue. No missed doses of anticoagulant.  Today, he denies symptoms of orthopnea, PND, lower extremity edema, dizziness, presyncope, syncope, snoring, daytime somnolence, bleeding, or neurologic sequela. The patient is tolerating medications without difficulties and is otherwise without complaint today.    he has a BMI of Body mass index is 35.35 kg/m.SABRA Filed Weights   04/19/24 0948  Weight: 111.8 kg     Current Outpatient Medications  Medication Sig Dispense Refill   albuterol  (VENTOLIN  HFA) 108 (90 Base) MCG/ACT inhaler Inhale 1-2 puffs into the lungs every 6 (six) hours as needed for wheezing or shortness of breath. 10 each 6   Blood Glucose Monitoring Suppl DEVI 1 each by Does not apply route in the morning, at noon, and at bedtime. May substitute to any manufacturer covered by  patient's insurance. 1 each 0   CINNAMON PO Take 1,000 mg by mouth 2 (two) times daily.     clopidogrel  (PLAVIX ) 75 MG tablet Take 1 tablet (75 mg total) by mouth daily. 90 tablet 3   colchicine  0.6 MG tablet Take 1 tablet (0.6 mg total) by mouth 2 (two) times daily for 5 days. 10 tablet 0   dapagliflozin  propanediol (FARXIGA ) 5 MG TABS tablet Take 1 tablet (5 mg total) by mouth daily. 30 tablet 11   dofetilide  (TIKOSYN ) 250 MCG capsule Take 1 capsule (250 mcg total) by mouth 2 (two) times daily. 180 capsule 6   ELIQUIS  5 MG TABS tablet Take 1 tablet by mouth twice daily 180 tablet 0   furosemide  (LASIX ) 40 MG tablet Take 40 mg by mouth daily.     gabapentin  (NEURONTIN ) 100 MG capsule Take 300 mg by mouth at bedtime.     glipiZIDE  (GLUCOTROL ) 10 MG tablet TAKE 1 TABLET BY MOUTH TWICE DAILY BEFORE A MEAL 180 tablet 0   metFORMIN  (GLUCOPHAGE ) 1000 MG tablet Take 1 tablet (1,000 mg total) by mouth 2 (two) times daily with a meal. 180 tablet 3   metoprolol  succinate (TOPROL -XL) 50 MG 24 hr tablet Take 1 tablet (50 mg total) by mouth daily. Take with or immediately following a meal. 90 tablet 3   metoprolol  tartrate (LOPRESSOR ) 25 MG tablet Take 1 tablet (25 mg total) by mouth 2 (two) times daily as needed (HR >100). 60 tablet 11   nitroGLYCERIN  (NITROSTAT ) 0.4 MG SL tablet Place 0.4 mg  under the tongue every 5 (five) minutes as needed for chest pain.     Omega 3 1000 MG CAPS Take 1,000 mg by mouth 2 (two) times daily.     pantoprazole  (PROTONIX ) 40 MG tablet Take 1 tablet (40 mg total) by mouth daily. 45 tablet 0   rosuvastatin  (CRESTOR ) 20 MG tablet Take 1 tablet (20 mg total) by mouth daily. 90 tablet 0   sacubitril -valsartan  (ENTRESTO ) 24-26 MG TAKE 1 TABLET BY MOUTH TWICE DAILY. START THIS ON THURSDAY 2/13 60 tablet 11   Semaglutide , 1 MG/DOSE, 4 MG/3ML SOPN Inject 1 mg as directed once a week. 3 mL 1   sildenafil  (VIAGRA ) 50 MG tablet Take 1 tablet (50 mg total) by mouth daily as needed for  erectile dysfunction. 10 tablet 6   spironolactone  (ALDACTONE ) 25 MG tablet Take 1 tablet by mouth once daily 30 tablet 11   No current facility-administered medications for this encounter.    Atrial Fibrillation Management history:  Previous antiarrhythmic drugs: Tikosyn  Previous cardioversions: 10/13/23 Previous ablations: 03/19/24 Anticoagulation history: Eliquis    ROS- All systems are reviewed and negative except as per the HPI above.  Physical Exam: BP 129/84   Pulse 63   Ht 5' 10 (1.778 m)   Wt 111.8 kg   BMI 35.35 kg/m   GEN- The patient is well appearing, alert and oriented x 3 today.   Neck - no JVD or carotid bruit noted Lungs- Clear to ausculation bilaterally, normal work of breathing Heart- Regular rate and rhythm, no murmurs, rubs or gallops, PMI not laterally displaced Extremities- no clubbing, cyanosis, or edema Skin - no rash or ecchymosis noted   EKG today demonstrates  Vent. rate 63 BPM PR interval 224 ms QRS duration 94 ms QT/QTcB 450/460 ms P-R-T axes 62 95 39 Sinus rhythm with 1st degree A-V block Rightward axis Borderline ECG When compared with ECG of 19-Mar-2024 11:15, No significant change was found  Echo 12/16/23 demonstrated  1. Left ventricular ejection fraction, by estimation, is 40 to 45%. The  left ventricle has mildly decreased function. The left ventricle  demonstrates global hypokinesis. Left ventricular diastolic parameters are  indeterminate.   2. Right ventricular systolic function is normal. The right ventricular  size is mildly enlarged.   3. The mitral valve is normal in structure. No evidence of mitral valve  regurgitation. No evidence of mitral stenosis. Moderate mitral annular  calcification.   4. The aortic valve is tricuspid. Aortic valve regurgitation is not  visualized. No aortic stenosis is present.   5. The inferior vena cava is normal in size with greater than 50%  respiratory variability, suggesting right atrial  pressure of 3 mmHg.   ASSESSMENT & PLAN CHA2DS2-VASc Score = 4  The patient's score is based upon: CHF History: 1 HTN History: 1 Diabetes History: 1 Stroke History: 0 Vascular Disease History: 1 Age Score: 0 Gender Score: 0       ASSESSMENT AND PLAN: Longstanding Persistent Atrial Fibrillation (ICD10:  I48.11) The patient's CHA2DS2-VASc score is 4, indicating a 4.8% annual risk of stroke.   S/p Tikosyn  admission 6/24-27/2025 S/p Afib ablation on 03/19/24 by Dr. Cindie.  Patient is currently in NSR. Recommended for patient to take his Tikosyn  early on day of angiogram to avoid missing a dose.   High risk medication monitoring (ICD10: J342684) Patient requires ongoing monitoring for anti-arrhythmic medication which has the potential to cause life threatening arrhythmias or AV block. Qtc stable. Continue Tikosyn  250 mcg BID. Bmet and  mag drawn today.   Secondary Hypercoagulable State (ICD10:  D68.69) The patient is at significant risk for stroke/thromboembolism based upon his CHA2DS2-VASc Score of 4.  Continue Apixaban  (Eliquis ).  No missed doses.     Follow up EP as scheduled.    Terra Pac, [name]  [facility] [address] [place], [place] [address] [phone]

## 2024-04-19 NOTE — Assessment & Plan Note (Signed)
 History of essential hypertension blood pressure measured today at 129/84.  He is on metoprolol  and Entresto .

## 2024-04-19 NOTE — Patient Instructions (Signed)
 Medication Instructions:  Your physician recommends that you continue on your current medications as directed. Please refer to the Current Medication list given to you today.  *If you need a refill on your cardiac medications before your next appointment, please call your pharmacy*  Lab Work: Your physician recommends that you return for lab work in: after 9/9 for BMET & CBC  If you have labs (blood work) drawn today and your tests are completely normal, you will receive your results only by: MyChart Message (if you have MyChart) OR A paper copy in the mail If you have any lab test that is abnormal or we need to change your treatment, we will call you to review the results.  Testing/Procedures: Your physician has requested that you have an Aorta/Iliac Duplex. This will take place at [address], 4th floor  No food after 11PM the night before.  Water is OK. (Don't drink liquids if you have been instructed not to for ANOTHER test) Avoid foods that produce bowel gas, for 24 hours prior to exam (see below). No breakfast, no chewing gum, no smoking or carbonated beverages. Patient may take morning medications with water. Come in for test at least 15 minutes early to register.  Please note: We ask at that you not bring children with you during ultrasound (echo/ vascular) testing. Due to room size and safety concerns, children are not allowed in the ultrasound rooms during exams. Our front office staff cannot provide observation of children in our lobby area while testing is being conducted. An adult accompanying a patient to their appointment will only be allowed in the ultrasound room at the discretion of the ultrasound technician under special circumstances. We apologize for any inconvenience. **Need pre-procedure (before 10/9) and 1-2 weeks post procedure**  Your physician has requested that you have a lower extremity arterial duplex. During this test, ultrasound is used to evaluate  arterial blood flow in the legs. Allow one hour for this exam. There are no restrictions or special instructions. This will take place at [address] 4th floor **Need pre-procedure (before 10/9) and 1-2 weeks post procedure**  Please note: We ask at that you not bring children with you during ultrasound (echo/ vascular) testing. Due to room size and safety concerns, children are not allowed in the ultrasound rooms during exams. Our front office staff cannot provide observation of children in our lobby area while testing is being conducted. An adult accompanying a patient to their appointment will only be allowed in the ultrasound room at the discretion of the ultrasound technician under special circumstances. We apologize for any inconvenience.  Your physician has requested that you have an ankle brachial index (ABI). During this test an ultrasound and blood pressure cuff are used to evaluate the arteries that supply the arms and legs with blood. Allow thirty minutes for this exam. There are no restrictions or special instructions. This will take place at [address] 4th floor **Need pre-procedure (before 10/9) and 1-2 weeks post procedure**   Please note: We ask at that you not bring children with you during ultrasound (echo/ vascular) testing. Due to room size and safety concerns, children are not allowed in the ultrasound rooms during exams. Our front office staff cannot provide observation of children in our lobby area while testing is being conducted. An adult accompanying a patient to their appointment will only be allowed in the ultrasound room at the discretion of the ultrasound technician under special circumstances. We apologize for any  inconvenience.    Follow-Up: At [facility], you and your health needs are our priority.  As part of our continuing mission to provide you with exceptional heart care, our providers are all part of one team.  This team includes your primary  Cardiologist (physician) and Advanced Practice Providers or APPs (Physician Assistants and Nurse Practitioners) who all work together to provide you with the care you need, when you need it.  Your next appointment:   2-3 week(s) after your procedure (10/9)  Provider:   Dorn Lesches, MD    We recommend signing up for the patient portal called MyChart.  Sign up information is provided on this After Visit Summary.  MyChart is used to connect with patients for Virtual Visits (Telemedicine).  Patients are able to view lab/test results, encounter notes, upcoming appointments, etc.  Non-urgent messages can be sent to your provider as well.   To learn more about what you can do with MyChart, go to [email or web address].   Other Instructions       Cardiac/Peripheral Catheterization   You are scheduled for a Peripheral Angiogram on Thursday, October 9 with Dr. Dorn Lesches.  1. Please arrive at the [facility] (Main Entrance A) at [facility]: [address] [place], [place] [address] at 5:30 AM (This time is 2 hour(s) before your procedure to ensure your preparation).   Free valet parking service is available. You will check in at ADMITTING. The support person will be asked to wait in the waiting room.  It is OK to have someone drop you off and come back when you are ready to be discharged.        Special note: Every effort is made to have your procedure done on time. Please understand that emergencies sometimes delay scheduled procedures.  2. Diet: Nothing to eat after midnight.  3. Hydration:You need to be well hydrated before your procedure. On October 9, you may drink approved liquids (see below) until 2 hours before the procedure, with 16 oz of water as your last intake.   List of approved liquids water, clear juice, clear tea, black coffee, fruit juices, non-citric and without pulp, carbonated beverages, Gatorade, Kool -Aid, plain Jello-O and plain ice popsicles.  4.  Labs: You will need to have blood drawn after 9/9. 5. Medication instructions in preparation for your procedure:   Stop taking Eliquis  on Tuesday, October 7th  Stop taking, Lasix  (Furosemide )  and Spironalactone Thursday, October 9,  Hold Faxiga starting Monday October 6th  Do not take Diabetes Med Glipizide  on the day of the procedure and HOLD 48 HOURS AFTER THE PROCEDURE.  On the morning of your procedure, take Aspirin  81 mg and Plavix /Clopidogrel  and any morning medicines NOT listed above.  You may use sips of water.  6. Plan to go home the same day, you will only stay overnight if medically necessary. 7. You MUST have a responsible adult to drive you home. 8. An adult MUST be with you the first 24 hours after you arrive home. 9. Bring a current list of your medications, and the last time and date medication taken. 10. Bring ID and current insurance cards. 11.Please wear clothes that are easy to get on and off and wear slip-on shoes.  Thank you for allowing us  to care for you!   -- [facility]

## 2024-04-19 NOTE — Assessment & Plan Note (Signed)
 History of PAD status post left common iliac artery PTA and covered stenting by myself 04/06/2020 using an 8 mm x 29 mm long VBX covered stent.  We did have a 75% calcified left common femoral artery stenosis at the time.  Has had progressive lifestyle-limiting claudication with his most recent Dopplers performed 07/04/2023 suggesting occlusion of his previously placed stent.  Patient wishes to have this re  intervened on.

## 2024-04-19 NOTE — Assessment & Plan Note (Signed)
 Ongoing tobacco abuse of 1-1/2 packs a day down to 1/2 pack a day.  We talked about the importance of smoking cessation especially in light of potential repeat lower extremity revascularization.

## 2024-04-21 ENCOUNTER — Telehealth: Payer: Self-pay | Admitting: Cardiovascular Disease

## 2024-04-21 NOTE — Telephone Encounter (Signed)
 Patient needs his surgery date reschedule. Please advise

## 2024-04-22 NOTE — Telephone Encounter (Signed)
 Pt calling back to f/u on call from yesterday regarding rescheduling procedure. Pt states if possible could he get a c/b before 3:30 being that he has to be at work. Please advise

## 2024-04-22 NOTE — Telephone Encounter (Signed)
 Informed patient Dr. Ranee nurse is out of the office until next week.  Patient states the person who would be driving him home after his procedure on 10/9 is going on vacation that day and will return 10/15. Patient is requesting to reschedule procedure sooner, or after 10/15.  He also asked approximately how long can he expect to be at the hospital before being discharge (if overnight stay not needed). He states his ride does not get off work until 2:30 PM sometimes.  Patient aware Dr. Ranee nurse will call him to reschedule procedure and appts as necessary when she returns to office next week. Patient verbalized understanding.

## 2024-04-25 NOTE — Progress Notes (Unsigned)
 Ian [name], male    DOB: 05-Apr-1971    MRN: [id number]   Brief patient profile:  53  yowm  quit smoking 04/25/24  referred to pulmonary clinic in [facility]  04/28/2024 by Jeoffrey Barrio NP  for doe x 2015 and h/o MPNs  bx in [place] neg with pfts maybe copd  but does not remember doctor's name.  Pt not previously seen by PCCM service.     History of Present Illness  04/28/2024  Pulmonary/ 1st office eval/ Karli Wickizer / [place] [facility]  Chief Complaint  Patient presents with   Establish Care    Possible copd   Dyspnea:  better since reduced then quit smoking /limited by claudication L leg > sob  Cough: none  Sleep: bed is flat/ on side / 2 pillows on CPAP (with 02 )  SABA use: up to twice daily  02: not using x for cpap  LDSCT: not due until 02/2025   No obvious day to day or daytime pattern/variability or assoc excess/ purulent sputum or mucus plugs or hemoptysis or cp or chest tightness, subjective wheeze or overt sinus or hb symptoms.    Also denies any obvious fluctuation of symptoms with weather or environmental changes or other aggravating or alleviating factors except as outlined above   No unusual exposure hx or h/o childhood pna/ asthma or knowledge of premature birth.  Current Allergies, Complete Past Medical History, Past Surgical History, Family History, and Social History were reviewed in [employer] record.  ROS  The following are not active complaints unless bolded Hoarseness, sore throat, dysphagia, dental problems, itching, sneezing,  nasal congestion or discharge of excess mucus or purulent secretions, ear ache,   fever, chills, sweats, unintended wt loss or wt gain, classically pleuritic or exertional cp,  orthopnea pnd or arm/hand swelling  or leg swelling, presyncope, palpitations, abdominal pain, anorexia, nausea, vomiting, diarrhea  or change in bowel habits or change in bladder habits, change in stools or change in urine, dysuria, hematuria,   rash, arthralgias, visual complaints, headache, numbness, weakness or ataxia or problems with walking or coordination,  change in mood or  memory.claudication L leg            Outpatient Medications Prior to Visit  Medication Sig Dispense Refill   albuterol  (VENTOLIN  HFA) 108 (90 Base) MCG/ACT inhaler Inhale 1-2 puffs into the lungs every 6 (six) hours as needed for wheezing or shortness of breath. 10 each 6   Blood Glucose Monitoring Suppl DEVI 1 each by Does not apply route in the morning, at noon, and at bedtime. May substitute to any manufacturer covered by patient's insurance. 1 each 0   CINNAMON PO Take 1,000 mg by mouth 2 (two) times daily.     clopidogrel  (PLAVIX ) 75 MG tablet Take 1 tablet (75 mg total) by mouth daily. 90 tablet 3   colchicine  0.6 MG tablet Take 1 tablet (0.6 mg total) by mouth 2 (two) times daily for 5 days. 10 tablet 0   dapagliflozin  propanediol (FARXIGA ) 5 MG TABS tablet Take 1 tablet (5 mg total) by mouth daily. 30 tablet 11   dofetilide  (TIKOSYN ) 250 MCG capsule Take 1 capsule (250 mcg total) by mouth 2 (two) times daily. 180 capsule 6   ELIQUIS  5 MG TABS tablet Take 1 tablet by mouth twice daily 180 tablet 0   furosemide  (LASIX ) 40 MG tablet Take 40 mg by mouth daily.     gabapentin  (NEURONTIN ) 100 MG capsule Take 300 mg  by mouth at bedtime.     glipiZIDE  (GLUCOTROL ) 10 MG tablet TAKE 1 TABLET BY MOUTH TWICE DAILY BEFORE A MEAL 180 tablet 0   metFORMIN  (GLUCOPHAGE ) 1000 MG tablet Take 1 tablet (1,000 mg total) by mouth 2 (two) times daily with a meal. 180 tablet 3   metoprolol  succinate (TOPROL -XL) 50 MG 24 hr tablet Take 1 tablet (50 mg total) by mouth daily. Take with or immediately following a meal. 90 tablet 3   metoprolol  tartrate (LOPRESSOR ) 25 MG tablet Take 1 tablet (25 mg total) by mouth 2 (two) times daily as needed (HR >100). 60 tablet 11   nitroGLYCERIN  (NITROSTAT ) 0.4 MG SL tablet Place 0.4 mg under the tongue every 5 (five) minutes as needed for  chest pain.     Omega 3 1000 MG CAPS Take 1,000 mg by mouth 2 (two) times daily.     pantoprazole  (PROTONIX ) 40 MG tablet Take 1 tablet (40 mg total) by mouth daily. 45 tablet 0   rosuvastatin  (CRESTOR ) 20 MG tablet Take 1 tablet (20 mg total) by mouth daily. 90 tablet 0   sacubitril -valsartan  (ENTRESTO ) 24-26 MG TAKE 1 TABLET BY MOUTH TWICE DAILY. START THIS ON THURSDAY 2/13 60 tablet 11   Semaglutide , 1 MG/DOSE, 4 MG/3ML SOPN Inject 1 mg as directed once a week. 3 mL 1   sildenafil  (VIAGRA ) 50 MG tablet Take 1 tablet (50 mg total) by mouth daily as needed for erectile dysfunction. 10 tablet 6   spironolactone  (ALDACTONE ) 25 MG tablet Take 1 tablet by mouth once daily 30 tablet 11   No facility-administered medications prior to visit.    Past Medical History:  Diagnosis Date   Arthritis    left ankle (09/04/2016)   Cardiomyopathy (HCC)    a. EF 40-45% by prior echo. .b. 50-55% in 02/2020.   Complication of anesthesia    hard to get me out of it sometimes; usually takes me longer to get under   Coronary artery disease    a. multiple PCIs to RCA.   Essential hypertension 12/19/2015   Hyperlipidemia 05/21/2015   Hypertension    NSTEMI (non-ST elevated myocardial infarction) (HCC) 05/2015   thelbert 05/23/2015 minor one   PAD (peripheral artery disease) (HCC)    PAF (paroxysmal atrial fibrillation) (HCC)    Tobacco abuse 05/21/2015   Type II diabetes mellitus (HCC)       Objective:     BP 107/68   Pulse (!) 57   Ht 5' 10 (1.778 m)   Wt 242 lb (109.8 kg)   SpO2 100%   BMI 34.72 kg/m   SpO2: 100 % RA  Amb slt hoarse mod obese  (by BMI) wm nad   HEENT : Oropharynx  clear      Nasal turbinates nl    NECK :  without  apparent JVD/ palpable Nodes/TM    LUNGS: no acc muscle use,  Nl contour chest which is clear to A and P bilaterally without cough on insp or exp maneuvers   CV:  RRR  no s3 or murmur or increase in P2, and no edema   ABD: obese  soft and nontender    MS:  Gait nl   ext warm without deformities Or obvious joint restrictions  calf tenderness, cyanosis or clubbing    SKIN: warm and dry without lesions    NEURO:  alert, approp, nl sensorium with  no motor or cerebellar deficits apparent.       I personally reviewed  images and agree with radiology impression as follows:   Chest CT 03/04/24   portion of cardiac CT 1. Mild diffuse bronchial wall thickening  2. Prominent mediastinal lymph nodes, nonspecific possibly reactive.    Assessment   Assessment & Plan Dyspnea on exertion Quit smoking  04/25/24  at wt 242  - PFTs in [place] ? 2023 requested  - Echo 12/16/23  improved to 40-45% vs Priors   Suspect he only has mild copd with multifactorial doe with obesity/ deconditioning limited by PVD and mild chf   >>> await records from [facility] / use saba prn like a Group A symptom/ risk copd pt.   Re SABA :  I spent extra time with pt today reviewing appropriate use of albuterol  for prn use on exertion with the following points: 1) saba is for relief of sob that does not improve by walking a slower pace or resting but rather if the pt does not improve after trying this first. 2) If the pt is convinced, as many are, that saba helps recover from activity faster then it's easy to tell if this is the case by re-challenging : ie stop, take the inhaler, then p 5 minutes try the exact same activity (intensity of workload) that just caused the symptoms and see if they are substantially diminished or not after saba 3) if there is an activity that reproducibly causes the symptoms, try the saba 15 min before the activity on alternate days   If in fact the saba really does help, then fine to continue to use it prn but advised may need to look closer at the maintenance regimen( for now = 0, but could add lama/laba as a group B if truly limited by doe and he proves to have copd ) being used to achieve better control of airways disease with exertion.     Former cigarette smoker Quit smoking 04/25/24  - CT chest on cardiac  study 03/04/24 > no nodules   >>>>  f/u needed with LDSCT  03/04/25   Low-dose CT lung cancer screening is recommended for patients who are 34-58 years of age with a 20+ pack-year history of smoking and who are currently smoking or quit <=15 years ago. No coughing up blood  No unintentional weight loss of > 15 pounds in the last 6 months - pt is eligible for scanning yearly until 2040/ discussed           Each maintenance medication was reviewed in detail including emphasizing most importantly the difference between maintenance and prns and under what circumstances the prns are to be triggered using an action plan format where appropriate.  Total time for H and P, chart review, counseling, reviewing hfa device(s) and generating customized AVS unique to this office visit / same day charting = 61 min new pt eval          AVS  Patient Instructions  You will need another CT scan in July 2026 -  low dose screening CT  I need your Pulmonary doctor's name for his last note and your lung function tests  Use your albuterol  as a rescue medication to be used if you can't catch your breath by resting, slowing your pace,  or doing a relaxed purse lip breathing pattern.  - The less you use it, the better it will work when you need it. - Ok to use up to 2 puffs  every 4 hours if you must but call for  appointment if use  goes up over your usual need - Don't leave home without it !!  (think of it like a spare tire or starter fluid for your car)    Also  Ok to try albuterol  15 min before an activity (on alternating days)  that you know would usually make you short of breath and see if it makes any difference and if makes none then don't take albuterol  after activity unless you can't catch your breath as this means it's the resting that helps, not the albuterol .  Good luck maintaining smoking cessation, it's the most important  aspect of your care   Please schedule a follow up visit in 6 months but call sooner if needed - bring all inhalers        Ozell America, MD 04/30/2024

## 2024-04-26 NOTE — Telephone Encounter (Signed)
 Informed the patient that she will not be back until tomorrow; so it will be tomorrow or after, when she will give you a call back to reschedule procedure. He verbalized understanding.

## 2024-04-26 NOTE — Telephone Encounter (Signed)
 Patient is calling back for update. Please advise

## 2024-04-28 ENCOUNTER — Encounter: Payer: Self-pay | Admitting: Cardiovascular Disease

## 2024-04-28 ENCOUNTER — Encounter: Payer: Self-pay | Admitting: Internal Medicine

## 2024-04-28 ENCOUNTER — Ambulatory Visit: Admitting: Internal Medicine

## 2024-04-28 VITALS — BP 107/68 | HR 57 | Ht 70.0 in | Wt 242.0 lb

## 2024-04-28 DIAGNOSIS — F17211 Nicotine dependence, cigarettes, in remission: Secondary | ICD-10-CM

## 2024-04-28 DIAGNOSIS — Z87891 Personal history of nicotine dependence: Secondary | ICD-10-CM | POA: Insufficient documentation

## 2024-04-28 DIAGNOSIS — R0609 Other forms of dyspnea: Secondary | ICD-10-CM | POA: Diagnosis not present

## 2024-04-28 NOTE — Telephone Encounter (Signed)
See my chart message for more information  

## 2024-04-28 NOTE — Assessment & Plan Note (Signed)
 Quit smoking  04/25/24  at wt 242  - PFTs in [place] ? 2023 requested  - Echo 12/16/23  improved to 40-45% vs Priors   Suspect he only has mild copd with multifactorial doe with obesity/ deconditioning limited by PVD and mild chf   >>> await records from [facility] / use saba prn like a Group A symptom/ risk copd pt.   Re SABA :  I spent extra time with pt today reviewing appropriate use of albuterol  for prn use on exertion with the following points: 1) saba is for relief of sob that does not improve by walking a slower pace or resting but rather if the pt does not improve after trying this first. 2) If the pt is convinced, as many are, that saba helps recover from activity faster then it's easy to tell if this is the case by re-challenging : ie stop, take the inhaler, then p 5 minutes try the exact same activity (intensity of workload) that just caused the symptoms and see if they are substantially diminished or not after saba 3) if there is an activity that reproducibly causes the symptoms, try the saba 15 min before the activity on alternate days   If in fact the saba really does help, then fine to continue to use it prn but advised may need to look closer at the maintenance regimen( for now = 0, but could add lama/laba as a group B if truly limited by doe and he proves to have copd ) being used to achieve better control of airways disease with exertion.

## 2024-04-28 NOTE — Telephone Encounter (Signed)
 Spoke with pt regarding changing the date of his PV angiogram due to not having someone to drive him home. We will move pt's procedure to 10/23 at 7:30am. Pt is aware that we will need to move his post procedure dopplers. Will have scheduling reach out to get these changed. Pt verbalizes understanding. Updated instructions sent via mychart.

## 2024-04-28 NOTE — Patient Instructions (Addendum)
 You will need another CT scan in July 2026 -  low dose screening CT  I need your Pulmonary doctor's name for his last note and your lung function tests  Use your albuterol  as a rescue medication to be used if you can't catch your breath by resting, slowing your pace,  or doing a relaxed purse lip breathing pattern.  - The less you use it, the better it will work when you need it. - Ok to use up to 2 puffs  every 4 hours if you must but call for  appointment if use goes up over your usual need - Don't leave home without it !!  (think of it like a spare tire or starter fluid for your car)    Also  Ok to try albuterol  15 min before an activity (on alternating days)  that you know would usually make you short of breath and see if it makes any difference and if makes none then don't take albuterol  after activity unless you can't catch your breath as this means it's the resting that helps, not the albuterol .  Good luck maintaining smoking cessation, it's the most important aspect of your care   Please schedule a follow up visit in 6 months but call sooner if needed - bring all inhalers

## 2024-04-29 ENCOUNTER — Other Ambulatory Visit: Payer: Self-pay | Admitting: Family Medicine

## 2024-04-29 DIAGNOSIS — M542 Cervicalgia: Secondary | ICD-10-CM

## 2024-04-30 ENCOUNTER — Encounter: Payer: Self-pay | Admitting: Internal Medicine

## 2024-04-30 ENCOUNTER — Encounter: Payer: Self-pay | Admitting: Cardiovascular Disease

## 2024-04-30 NOTE — Assessment & Plan Note (Addendum)
 Quit smoking 04/25/24  - CT chest on cardiac  study 03/04/24 > no nodules   >>>>  f/u needed with LDSCT  03/04/25   Low-dose CT lung cancer screening is recommended for patients who are 41-54 years of age with a 20+ pack-year history of smoking and who are currently smoking or quit <=15 years ago. No coughing up blood  No unintentional weight loss of > 15 pounds in the last 6 months - pt is eligible for scanning yearly until 2040/ discussed           Each maintenance medication was reviewed in detail including emphasizing most importantly the difference between maintenance and prns and under what circumstances the prns are to be triggered using an action plan format where appropriate.  Total time for H and P, chart review, counseling, reviewing hfa device(s) and generating customized AVS unique to this office visit / same day charting = 61 min new pt eval

## 2024-05-10 ENCOUNTER — Telehealth (HOSPITAL_COMMUNITY): Payer: Self-pay

## 2024-05-10 ENCOUNTER — Other Ambulatory Visit (HOSPITAL_COMMUNITY): Payer: Self-pay

## 2024-05-10 DIAGNOSIS — G4733 Obstructive sleep apnea (adult) (pediatric): Secondary | ICD-10-CM | POA: Diagnosis not present

## 2024-05-10 NOTE — Telephone Encounter (Signed)
 Advanced Heart Failure Patient Advocate Encounter  Received notification that prior authorization is needed for Sacubirtil-Valsartan . Test billing shows that this plan prefers brand name Entresto . Contacted pharmacy to process as DAW 9, confirmed $4 copay. No prior auth submitted at this time.  Rachel DEL, CPhT Rx Patient Advocate Phone: [phone]

## 2024-05-12 ENCOUNTER — Encounter: Payer: Self-pay | Admitting: Cardiovascular Disease

## 2024-05-12 DIAGNOSIS — G4733 Obstructive sleep apnea (adult) (pediatric): Secondary | ICD-10-CM | POA: Diagnosis not present

## 2024-05-12 DIAGNOSIS — I5022 Chronic systolic (congestive) heart failure: Secondary | ICD-10-CM | POA: Diagnosis not present

## 2024-05-13 ENCOUNTER — Other Ambulatory Visit: Payer: Self-pay | Admitting: Cardiovascular Disease

## 2024-05-13 DIAGNOSIS — I739 Peripheral vascular disease, unspecified: Secondary | ICD-10-CM

## 2024-05-14 ENCOUNTER — Ambulatory Visit (HOSPITAL_BASED_OUTPATIENT_CLINIC_OR_DEPARTMENT_OTHER)
Admission: RE | Admit: 2024-05-14 | Discharge: 2024-05-14 | Disposition: A | Discharge status: Home / Self Care | Source: Ambulatory Visit | Attending: Cardiovascular Disease | Admitting: Cardiovascular Disease

## 2024-05-14 ENCOUNTER — Ambulatory Visit: Payer: Self-pay | Admitting: Cardiovascular Disease

## 2024-05-14 ENCOUNTER — Ambulatory Visit (HOSPITAL_COMMUNITY)
Admission: RE | Admit: 2024-05-14 | Discharge: 2024-05-14 | Disposition: A | Discharge status: Home / Self Care | Source: Ambulatory Visit | Attending: Cardiovascular Disease | Admitting: Cardiovascular Disease

## 2024-05-14 DIAGNOSIS — I739 Peripheral vascular disease, unspecified: Secondary | ICD-10-CM | POA: Diagnosis not present

## 2024-05-14 DIAGNOSIS — Z95828 Presence of other vascular implants and grafts: Secondary | ICD-10-CM | POA: Insufficient documentation

## 2024-05-14 LAB — VAS US ABI WITH/WO TBI
Left ABI: 0.59
Right ABI: 1.13

## 2024-05-15 LAB — CBC WITH DIFFERENTIAL/PLATELET
Basophils Absolute: 0 x10E3/uL (ref 0.0–0.2)
Basos: 1 %
EOS (ABSOLUTE): 0.1 x10E3/uL (ref 0.0–0.4)
Eos: 2 %
Hematocrit: 47.4 % (ref 37.5–51.0)
Hemoglobin: 15.5 g/dL (ref 13.0–17.7)
Immature Grans (Abs): 0 x10E3/uL (ref 0.0–0.1)
Immature Granulocytes: 0 %
Lymphocytes Absolute: 2.5 x10E3/uL (ref 0.7–3.1)
Lymphs: 40 %
MCH: 32.5 pg (ref 26.6–33.0)
MCHC: 32.7 g/dL (ref 31.5–35.7)
MCV: 99 fL — ABNORMAL HIGH (ref 79–97)
Monocytes Absolute: 0.5 x10E3/uL (ref 0.1–0.9)
Monocytes: 8 %
Neutrophils Absolute: 3.1 x10E3/uL (ref 1.4–7.0)
Neutrophils: 49 %
Platelets: 159 x10E3/uL (ref 150–450)
RBC: 4.77 x10E6/uL (ref 4.14–5.80)
RDW: 12.5 % (ref 11.6–15.4)
WBC: 6.2 x10E3/uL (ref 3.4–10.8)

## 2024-05-15 LAB — BASIC METABOLIC PANEL WITH GFR
BUN/Creatinine Ratio: 11 (ref 9–20)
BUN: 11 mg/dL (ref 6–24)
CO2: 23 mmol/L (ref 20–29)
Calcium: 9.7 mg/dL (ref 8.7–10.2)
Chloride: 103 mmol/L (ref 96–106)
Creatinine, Ser: 0.96 mg/dL (ref 0.76–1.27)
Glucose: 64 mg/dL — ABNORMAL LOW (ref 70–99)
Potassium: 4.1 mmol/L (ref 3.5–5.2)
Sodium: 142 mmol/L (ref 134–144)
eGFR: 93 mL/min/1.73 (ref >59)

## 2024-05-19 ENCOUNTER — Other Ambulatory Visit

## 2024-05-25 ENCOUNTER — Other Ambulatory Visit: Payer: Self-pay | Admitting: Family Medicine

## 2024-05-25 DIAGNOSIS — M542 Cervicalgia: Secondary | ICD-10-CM

## 2024-05-26 ENCOUNTER — Ambulatory Visit: Admitting: Family Medicine

## 2024-06-01 ENCOUNTER — Telehealth: Payer: Self-pay | Admitting: *Deleted

## 2024-06-01 NOTE — Telephone Encounter (Signed)
 LEA scheduled at [facility] for: Thursday June 03, 2024 7:30 AM Arrival time [facility] Main Entrance A at: 5:30 AM  Diet: -Nothing to eat after midnight.  Hydration: -May drink clear liquids until 2 hours before the procedure.  Approved liquids: Water, clear tea, black coffee, fruit juices-non-citric and without pulp,Gatorade, plain Jello/popsicles.   -Please drink 16 oz of water 2 hours before procedure.  Medication instructions: -Hold:  Eliquis -none 06/01/24 until post procedure  Metformin -day of procedure and 48 hours post procedure  Farxiga /Glipizide /Spironolactone /Lasix -AM of procedure  Ozempic -weekly on Thursdays-pt will hold AM of procedure (Thursday) -Other usual morning medications can be taken including aspirin  81 mg and Plavix  75 mg.  Pt tells me he takes Tikosyn  9 AM -will take AM dose with him to hospital and take at hospital ~ 9 AM  I have asked him to let hospital staff know he has medication with him.  Plan to go home the same day, you will only stay overnight if medically necessary.  You must have responsible adult to drive you home.  Someone must be with you the first 24 hours after you arrive home.  Reviewed procedure instructions with patient.

## 2024-06-01 NOTE — H&P (Signed)
 06/02/22 Ian [name]   1971-03-02  [id number]   Primary Physician Kayla Jeoffrey RAMAN, FNP Primary Cardiologist: Dorn JINNY Lesches MD FACP, [place], [place], [place]   HPI:  Ian [name] is a 53 y.o.   moderately overweight separated Caucasian male father of 3 children, grandfather of 3 grandchildren who I last saw in the office 07/28/2023.  He was referred by Dr. Maranda, his cardiologist, for peripheral vascular evaluation because of lifestyle imaging claudication.  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  He has treated diabetes and hyperlipidemia.  He also has PAF on Eliquis .  His family history is remarkable for father who apparently had CAD.  He has had multiple coronary interventions dating back to 2016 when he had RCA stent.  He had orbital atherectomy his RCA restenting 09/05/2016.  He has had claudication for 4 years primarily on the left side with recent Doppler studies performed 03/06/2020 revealing a right ABI that was normal and a left of 0.53 with an occluded left common iliac artery.   I performed peripheral angiography on him 03/27/2020 revealing an occluded left common iliac artery which I was able to cross and stented with a VBX covered stent.  He was discharged home the following day.  His Dopplers performed 8/21 unfortunately, he does continue to smoke however.   I referred him to Dr. Zenaida for treatment of his cardiomyopathy for medication titration GDMT.  He underwent attempted DC cardioversion which was initially successful but reverted back to A-fib 1 week later.  He was sent to Dr. Cindie today for discussion about treatment options, rhythm versus rate control.  It was decided to pursue rhythm control with Tikosyn  bridge to ablation.  He had  admission for Tikosyn  loading which was successful in converting him to sinus rhythm.  He underwent A-fib ablation successfully by Dr. Cindie 1 month ago.   Since I saw him 4 months ago he still complains of lifestyle-limiting left lower extremity claudication.  His lower extremity arterial Doppler studies performed 07/04/2023 suggested occlusion of his left common iliac artery stent.  I am going to update his Doppler studies.  Talk to him about the importance of smoking cessation prior to his procedure.  We will schedule this in the next 3 to 4 weeks.     Active Medications      Current Meds  Medication Sig   albuterol  (VENTOLIN  HFA) 108 (90 Base) MCG/ACT inhaler Inhale 1-2 puffs into the lungs every 6 (six) hours as needed for wheezing or shortness of breath.   Blood Glucose Monitoring Suppl DEVI 1 each by Does not apply route in the morning, at noon, and at bedtime. May substitute to any manufacturer covered by patient's insurance.   CINNAMON PO Take 1,000 mg by mouth 2 (two) times daily.   clopidogrel  (PLAVIX ) 75 MG tablet Take 1 tablet (75 mg total) by mouth daily.   colchicine  0.6 MG tablet Take 1 tablet (0.6 mg total) by mouth 2 (two) times daily for 5 days.   dapagliflozin  propanediol (FARXIGA ) 5 MG TABS tablet Take 1 tablet (5 mg total) by mouth daily.   dofetilide  (TIKOSYN ) 250 MCG capsule Take 1 capsule (250 mcg total) by mouth 2 (two) times daily.   ELIQUIS  5  MG TABS tablet Take 1 tablet by mouth twice daily   furosemide  (LASIX ) 40 MG tablet Take 40 mg by mouth daily.   gabapentin  (NEURONTIN ) 100 MG capsule Take 300 mg by mouth at bedtime.   glipiZIDE  (GLUCOTROL ) 10 MG tablet TAKE 1 TABLET BY MOUTH TWICE DAILY BEFORE A MEAL   metFORMIN  (GLUCOPHAGE ) 1000 MG tablet Take 1 tablet (1,000 mg total) by mouth 2 (two) times daily with a meal.   metoprolol  succinate (TOPROL -XL) 50 MG 24 hr tablet Take 1 tablet (50 mg total) by mouth daily. Take with or immediately following a meal.    nitroGLYCERIN  (NITROSTAT ) 0.4 MG SL tablet Place 0.4 mg under the tongue every 5 (five) minutes as needed for chest pain.   Omega 3 1000 MG CAPS Take 1,000 mg by mouth 2 (two) times daily.   pantoprazole  (PROTONIX ) 40 MG tablet Take 1 tablet (40 mg total) by mouth daily.   rosuvastatin  (CRESTOR ) 20 MG tablet Take 1 tablet (20 mg total) by mouth daily.   sacubitril -valsartan  (ENTRESTO ) 24-26 MG TAKE 1 TABLET BY MOUTH TWICE DAILY. START THIS ON THURSDAY 2/13   Semaglutide , 1 MG/DOSE, 4 MG/3ML SOPN Inject 1 mg as directed once a week.   sildenafil  (VIAGRA ) 50 MG tablet Take 1 tablet (50 mg total) by mouth daily as needed for erectile dysfunction.   spironolactone  (ALDACTONE ) 25 MG tablet Take 1 tablet by mouth once daily        Allergies       Allergies  Allergen Reactions   Atorvastatin  Other (See Comments)      Pt reports causes myalgias. Tolerating rosuvastatin  low dose        Social History         Socioeconomic History   Marital status: Divorced      Spouse name: Not on file   Number of children: 3   Years of education: Not on file   Highest education level: Not on file  Occupational History   Not on file  Tobacco Use   Smoking status: Every Day      Current packs/day: 0.50      Average packs/day: 0.5 packs/day for 35.0 years (17.5 ttl pk-yrs)      Types: Cigarettes   Smokeless tobacco: Never   Tobacco comments:      04/19/2024 Patient smokes a half of pack daily       Currently smoked .5ppd 02/11/24  Vaping Use   Vaping status: Every Day   Substances: Nicotine   Substance and Sexual Activity   Alcohol use: Not Currently      Comment: 2025.....socially   Drug use: Not Currently      Types: Cocaine, Marijuana      Comment: 09/04/2016 haven't touched drugs since 1988   Sexual activity: Yes  Other Topics Concern   Not on file  Social History Narrative    Employed as a Naval architect. Resides in [place]  and [place]          2 stents to heart 2016, 2017    1 stent to  LLE 2021    Social Drivers of Health        Financial Resource Strain: Not on file  Food Insecurity: No Food Insecurity (02/03/2024)    Hunger Vital Sign     Worried About Running Out of Food in the Last Year: Never true     Ran Out of Food in the Last Year: Never true  Transportation Needs: No Transportation Needs (02/03/2024)    PRAPARE -  Therapist, art (Medical): No     Lack of Transportation (Non-Medical): No  Physical Activity: Not on file  Stress: Not on file  Social Connections: Not on file  Intimate Partner Violence: Not At Risk (02/03/2024)    Humiliation, Afraid, Rape, and Kick questionnaire     Fear of Current or Ex-Partner: No     Emotionally Abused: No     Physically Abused: No     Sexually Abused: No      Review of Systems: General: negative for chills, fever, night sweats or weight changes.  Cardiovascular: negative for chest pain, dyspnea on exertion, edema, orthopnea, palpitations, paroxysmal nocturnal dyspnea or shortness of breath Dermatological: negative for rash Respiratory: negative for cough or wheezing Urologic: negative for hematuria Abdominal: negative for nausea, vomiting, diarrhea, bright red blood per rectum, melena, or hematemesis Neurologic: negative for visual changes, syncope, or dizziness All other systems reviewed and are otherwise negative except as noted above.       Blood pressure 129/84, pulse 65, height 5' 10 (1.778 m), weight 246 lb 6.4 oz (111.8 kg), SpO2 99%.  General appearance: alert and no distress Neck: no adenopathy, no carotid bruit, no JVD, supple, symmetrical, trachea midline, and thyroid not enlarged, symmetric, no tenderness/mass/nodules Lungs: clear to auscultation bilaterally Heart: regular rate and rhythm, S1, S2 normal, no murmur, click, rub or gallop Extremities: extremities normal, atraumatic, no cyanosis or edema Pulses: 2+ and symmetric Absent left pedal pulse Skin: Skin color, texture,  turgor normal. No rashes or lesions Neurologic: Grossly normal   EKG not performed today       ASSESSMENT AND PLAN:    Hyperlipidemia History of hyperlipidemia on statin therapy with lipid profile performed 02/17/2024 revealing a total cholesterol of I 13, LDL 48 and HDL 32.   Tobacco abuse Ongoing tobacco abuse of 1-1/2 packs a day down to 1/2 pack a day.  We talked about the importance of smoking cessation especially in light of potential repeat lower extremity revascularization.   PAF (paroxysmal atrial fibrillation) (HCC) History of PAF maintaining sinus rhythm on Eliquis , Tikosyn  and recent A-fib ablation by Dr. Cindie 03/19/2024 maintaining sinus rhythm.   Essential hypertension History of essential hypertension blood pressure measured today at 129/84.  He is on metoprolol  and Entresto .   Coronary artery disease due to lipid rich plaque History of CAD status post multiple coronary interventions dating back to 2016 when he had RCA stenting.  He had RCA orbital atherectomy and restenting 09/05/2016.  Myocardial perfusion study performed 07/18/2023 was nonischemic.  He denies chest pain.   Ischemic cardiomyopathy History of ischemic/nonischemic cardiomyopathy on GDMT followed by Dr. Zenaida.  His most recent 2D echo performed 12/12/2023 revealed an EF of 40 to 45% representing slight improvement compared to his prior echo.  He denies shortness of breath.  It certainly possible that his A-fib in the past has contributed to his LV dysfunction.  Hopefully this will be rechecked in the near future status post afibrillation.   Peripheral arterial disease (HCC) History of PAD status post left common iliac artery PTA and covered stenting by myself 04/06/2020 using an 8 mm x 29 mm long VBX covered stent.  We did have a 75% calcified left common femoral artery stenosis at the time.  Has had progressive lifestyle-limiting claudication with his most recent Dopplers performed 07/04/2023 suggesting occlusion  of his previously placed stent.  Patient wishes to have this re  intervened on.     Dorn  DOROTHA Lesches, M.D., FACP, [facility], FAHA, [facility]  [address] [place], [place]  [address]  [id number] 06/01/2024 2:22 PM

## 2024-06-03 ENCOUNTER — Other Ambulatory Visit: Payer: Self-pay

## 2024-06-03 ENCOUNTER — Encounter (HOSPITAL_COMMUNITY)
Admission: RE | Disposition: A | Discharge status: Home / Self Care | Payer: Self-pay | Source: Home / Self Care | Attending: Cardiovascular Disease

## 2024-06-03 ENCOUNTER — Ambulatory Visit (HOSPITAL_COMMUNITY)
Admission: RE | Admit: 2024-06-03 | Discharge: 2024-06-03 | Disposition: A | Discharge status: Home / Self Care | Attending: Cardiovascular Disease | Admitting: Cardiovascular Disease

## 2024-06-03 DIAGNOSIS — Y831 Surgical operation with implant of artificial internal device as the cause of abnormal reaction of the patient, or of later complication, without mention of misadventure at the time of the procedure: Secondary | ICD-10-CM | POA: Diagnosis not present

## 2024-06-03 DIAGNOSIS — I70212 Atherosclerosis of native arteries of extremities with intermittent claudication, left leg: Secondary | ICD-10-CM | POA: Diagnosis not present

## 2024-06-03 DIAGNOSIS — E785 Hyperlipidemia, unspecified: Secondary | ICD-10-CM | POA: Diagnosis not present

## 2024-06-03 DIAGNOSIS — Z8249 Family history of ischemic heart disease and other diseases of the circulatory system: Secondary | ICD-10-CM | POA: Diagnosis not present

## 2024-06-03 DIAGNOSIS — Z7901 Long term (current) use of anticoagulants: Secondary | ICD-10-CM | POA: Insufficient documentation

## 2024-06-03 DIAGNOSIS — E1151 Type 2 diabetes mellitus with diabetic peripheral angiopathy without gangrene: Secondary | ICD-10-CM | POA: Diagnosis not present

## 2024-06-03 DIAGNOSIS — I2583 Coronary atherosclerosis due to lipid rich plaque: Secondary | ICD-10-CM | POA: Insufficient documentation

## 2024-06-03 DIAGNOSIS — Z7985 Long-term (current) use of injectable non-insulin antidiabetic drugs: Secondary | ICD-10-CM | POA: Insufficient documentation

## 2024-06-03 DIAGNOSIS — T82898A Other specified complication of vascular prosthetic devices, implants and grafts, initial encounter: Secondary | ICD-10-CM | POA: Diagnosis not present

## 2024-06-03 DIAGNOSIS — Z7902 Long term (current) use of antithrombotics/antiplatelets: Secondary | ICD-10-CM | POA: Insufficient documentation

## 2024-06-03 DIAGNOSIS — I255 Ischemic cardiomyopathy: Secondary | ICD-10-CM | POA: Diagnosis not present

## 2024-06-03 DIAGNOSIS — I739 Peripheral vascular disease, unspecified: Secondary | ICD-10-CM

## 2024-06-03 DIAGNOSIS — Z7984 Long term (current) use of oral hypoglycemic drugs: Secondary | ICD-10-CM | POA: Diagnosis not present

## 2024-06-03 DIAGNOSIS — I428 Other cardiomyopathies: Secondary | ICD-10-CM | POA: Insufficient documentation

## 2024-06-03 DIAGNOSIS — Z955 Presence of coronary angioplasty implant and graft: Secondary | ICD-10-CM | POA: Insufficient documentation

## 2024-06-03 DIAGNOSIS — I2510 Atherosclerotic heart disease of native coronary artery without angina pectoris: Secondary | ICD-10-CM | POA: Diagnosis not present

## 2024-06-03 DIAGNOSIS — F17210 Nicotine dependence, cigarettes, uncomplicated: Secondary | ICD-10-CM | POA: Insufficient documentation

## 2024-06-03 DIAGNOSIS — Z79899 Other long term (current) drug therapy: Secondary | ICD-10-CM | POA: Diagnosis not present

## 2024-06-03 DIAGNOSIS — Z95820 Peripheral vascular angioplasty status with implants and grafts: Secondary | ICD-10-CM | POA: Diagnosis not present

## 2024-06-03 DIAGNOSIS — I10 Essential (primary) hypertension: Secondary | ICD-10-CM | POA: Diagnosis not present

## 2024-06-03 DIAGNOSIS — I480 Paroxysmal atrial fibrillation: Secondary | ICD-10-CM | POA: Insufficient documentation

## 2024-06-03 HISTORY — PX: LOWER EXTREMITY ANGIOGRAPHY: CATH118251

## 2024-06-03 LAB — GLUCOSE, CAPILLARY
Glucose-Capillary: 62 mg/dL — ABNORMAL LOW (ref 70–99)
Glucose-Capillary: 94 mg/dL (ref 70–99)

## 2024-06-03 SURGERY — LOWER EXTREMITY ANGIOGRAPHY
Anesthesia: LOCAL

## 2024-06-03 MED ORDER — SODIUM CHLORIDE 0.9 % IV SOLN: 250.0000 mL | INTRAVENOUS | Status: DC | PRN

## 2024-06-03 MED ORDER — MIDAZOLAM HCL (PF) 2 MG/2ML IJ SOLN
INTRAMUSCULAR | Status: DC | PRN
  Administered 2024-06-03: 1 mg via INTRAVENOUS

## 2024-06-03 MED ORDER — FENTANYL CITRATE (PF) 100 MCG/2ML IJ SOLN
INTRAMUSCULAR | Status: AC
  Filled 2024-06-03: qty 2

## 2024-06-03 MED ORDER — LIDOCAINE HCL (PF) 1 % IJ SOLN
INTRAMUSCULAR | Status: AC
  Filled 2024-06-03: qty 30

## 2024-06-03 MED ORDER — SODIUM CHLORIDE 0.9% FLUSH: 3.0000 mL | Freq: Two times a day (BID) | INTRAVENOUS | Status: DC

## 2024-06-03 MED ORDER — MIDAZOLAM HCL 2 MG/2ML IJ SOLN
INTRAMUSCULAR | Status: AC
  Filled 2024-06-03: qty 2

## 2024-06-03 MED ORDER — IODIXANOL 320 MG/ML IV SOLN
INTRAVENOUS | Status: DC | PRN
  Administered 2024-06-03: 90 mL

## 2024-06-03 MED ORDER — HEPARIN (PORCINE) IN NACL 2000-0.9 UNIT/L-% IV SOLN
INTRAVENOUS | Status: DC | PRN
  Administered 2024-06-03: 1000 mL

## 2024-06-03 MED ORDER — DEXTROSE 50 % IV SOLN
12.5000 g | INTRAVENOUS | Status: AC
  Administered 2024-06-03: 12.5 g via INTRAVENOUS

## 2024-06-03 MED ORDER — LIDOCAINE HCL (PF) 1 % IJ SOLN
INTRAMUSCULAR | Status: DC | PRN
  Administered 2024-06-03: 15 mL

## 2024-06-03 MED ORDER — FENTANYL CITRATE (PF) 100 MCG/2ML IJ SOLN
INTRAMUSCULAR | Status: DC | PRN
  Administered 2024-06-03: 25 ug via INTRAVENOUS

## 2024-06-03 MED ORDER — FREE WATER: 500.0000 mL | Freq: Once | Status: DC

## 2024-06-03 MED ORDER — SODIUM CHLORIDE 0.9% FLUSH: 3.0000 mL | INTRAVENOUS | Status: DC | PRN

## 2024-06-03 MED ORDER — DEXTROSE 50 % IV SOLN
INTRAVENOUS | Status: DC
Start: 2024-06-03 — End: 2024-06-03
  Filled 2024-06-03: qty 50

## 2024-06-03 SURGICAL SUPPLY — 12 items
BAG SNAP BAND KOVER 36X36 (MISCELLANEOUS) IMPLANT
CATH ANGIO 5F PIGTAIL 65CM (CATHETERS) IMPLANT
CLOSURE MYNX CONTROL 5F (Vascular Products) IMPLANT
KIT MICROPUNCTURE NIT STIFF (SHEATH) IMPLANT
KIT SINGLE USE MANIFOLD (KITS) IMPLANT
KIT SYRINGE INJ CVI SPIKEX1 (MISCELLANEOUS) IMPLANT
SET ATX-X65L (MISCELLANEOUS) IMPLANT
SHEATH PINNACLE 5F 10CM (SHEATH) IMPLANT
SHEATH PROBE COVER 6X72 (BAG) IMPLANT
TRAY PV CATH (CUSTOM PROCEDURE TRAY) ×1 IMPLANT
WIRE BENTSON .035X145CM (WIRE) IMPLANT
WIRE HITORQ VERSACORE ST 145CM (WIRE) IMPLANT

## 2024-06-03 NOTE — Progress Notes (Signed)
 Discharge instructions reviewed with patient, while here and friend Dorthea over the telephone. Denies questions or concerns.

## 2024-06-03 NOTE — Progress Notes (Signed)
 PT ambulated in the hallway to the bathroom, was able to void without difficulty. PT tolerated PO intake. No s/s of complications at the incision site. PT escorted from the unit via wheelchair. To personal vehicle.

## 2024-06-03 NOTE — Interval H&P Note (Signed)
 History and Physical Interval Note:  06/03/2024 7:39 AM  Ian [name]  has presented today for surgery, with the diagnosis of pad.  The various methods of treatment have been discussed with the patient and family. After consideration of risks, benefits and other options for treatment, the patient has consented to  Procedure(s): Lower Extremity Angiography (N/A) as a surgical intervention.  The patient's history has been reviewed, patient examined, no change in status, stable for surgery.  I have reviewed the patient's chart and labs.  Questions were answered to the patient's satisfaction.     Dorn Lesches

## 2024-06-03 NOTE — Discharge Instructions (Signed)
 Femoral Site Care This sheet gives you information about how to care for yourself after your procedure. Your health care provider may also give you more specific instructions. If you have problems or questions, contact your health care provider. What can I expect after the procedure?  After the procedure, it is common to have: Bruising that usually fades within 1-2 weeks. Tenderness at the site. Follow these instructions at home: Wound care Follow instructions from your health care provider about how to take care of your insertion site. Make sure you: Wash your hands with soap and water before you change your bandage (dressing). If soap and water are not available, use hand sanitizer. Remove your dressing as told by your health care provider. In 24 hours Do not take baths, swim, or use a hot tub until your health care provider approves. You may shower 24-48 hours after the procedure or as told by your health care provider. Gently wash the site with plain soap and water. Pat the area dry with a clean towel. Do not rub the site. This may cause bleeding. Do not apply powder or lotion to the site. Keep the site clean and dry. Check your femoral site every day for signs of infection. Check for: Redness, swelling, or pain. Fluid or blood. Warmth. Pus or a bad smell. Activity For the first 2-3 days after your procedure, or as long as directed: Avoid climbing stairs as much as possible. Do not squat. Do not lift anything that is heavier than 10 lb (4.5 kg), or the limit that you are told, until your health care provider says that it is safe. For 5 days Rest as directed. Avoid sitting for a long time without moving. Get up to take short walks every 1-2 hours. Do not drive for 24 hours if you were given a medicine to help you relax (sedative). General instructions Take over-the-counter and prescription medicines only as told by your health care provider. Keep all follow-up visits as told by  your health care provider. This is important. Contact a health care provider if you have: A fever or chills. You have redness, swelling, or pain around your insertion site. Get help right away if: The catheter insertion area swells very fast. You pass out. You suddenly start to sweat or your skin gets clammy. The catheter insertion area is bleeding, and the bleeding does not stop when you hold steady pressure on the area. The area near or just beyond the catheter insertion site becomes pale, cool, tingly, or numb. These symptoms may represent a serious problem that is an emergency. Do not wait to see if the symptoms will go away. Get medical help right away. Call your local emergency services (911 in the [place] Do not drive yourself to the hospital. Summary After the procedure, it is common to have bruising that usually fades within 1-2 weeks. Check your femoral site every day for signs of infection. Do not lift anything that is heavier than 10 lb (4.5 kg), or the limit that you are told, until your health care provider says that it is safe. This information is not intended to replace advice given to you by your health care provider. Make sure you discuss any questions you have with your health care provider. Document Revised: 08/11/2017 Document Reviewed: 08/11/2017 Elsevier Patient Education  2020 [employer].

## 2024-06-04 ENCOUNTER — Encounter (HOSPITAL_COMMUNITY)

## 2024-06-04 ENCOUNTER — Encounter (HOSPITAL_COMMUNITY): Payer: Self-pay | Admitting: Cardiovascular Disease

## 2024-06-09 ENCOUNTER — Other Ambulatory Visit (HOSPITAL_COMMUNITY): Payer: Self-pay

## 2024-06-09 ENCOUNTER — Other Ambulatory Visit

## 2024-06-09 DIAGNOSIS — E118 Type 2 diabetes mellitus with unspecified complications: Secondary | ICD-10-CM

## 2024-06-10 ENCOUNTER — Ambulatory Visit: Payer: Self-pay | Admitting: Family Medicine

## 2024-06-10 LAB — COMPREHENSIVE METABOLIC PANEL WITH GFR
AG Ratio: 2 (calc) (ref 1.0–2.5)
ALT: 18 U/L (ref 9–46)
AST: 13 U/L (ref 10–35)
Albumin: 4.7 g/dL (ref 3.6–5.1)
Alkaline phosphatase (APISO): 56 U/L (ref 35–144)
BUN: 20 mg/dL (ref 7–25)
CO2: 29 mmol/L (ref 20–32)
Calcium: 10.1 mg/dL (ref 8.6–10.3)
Chloride: 98 mmol/L (ref 98–110)
Creat: 0.97 mg/dL (ref 0.70–1.30)
Globulin: 2.4 g/dL (ref 1.9–3.7)
Glucose, Bld: 112 mg/dL — ABNORMAL HIGH (ref 65–99)
Potassium: 4.3 mmol/L (ref 3.5–5.3)
Sodium: 137 mmol/L (ref 135–146)
Total Bilirubin: 0.6 mg/dL (ref 0.2–1.2)
Total Protein: 7.1 g/dL (ref 6.1–8.1)
eGFR: 92 mL/min/1.73m2 (ref >=60)

## 2024-06-10 LAB — HEMOGLOBIN A1C
Hgb A1c MFr Bld: 5.5 % (ref <5.7)
Mean Plasma Glucose: 111 mg/dL
eAG (mmol/L): 6.2 mmol/L

## 2024-06-10 LAB — LIPID PANEL
Cholesterol: 92 mg/dL (ref <200)
HDL: 29 mg/dL — ABNORMAL LOW (ref >=40)
LDL Cholesterol (Calc): 36 mg/dL
Non-HDL Cholesterol (Calc): 63 mg/dL (ref <130)
Total CHOL/HDL Ratio: 3.2 (calc) (ref <5.0)
Triglycerides: 199 mg/dL — ABNORMAL HIGH (ref <150)

## 2024-06-10 LAB — MICROALBUMIN / CREATININE URINE RATIO
Creatinine, Urine: 165 mg/dL (ref 20–320)
Microalb Creat Ratio: 62 mg/g{creat} — ABNORMAL HIGH (ref <30)
Microalb, Ur: 10.2 mg/dL

## 2024-06-10 LAB — VITAMIN B12: Vitamin B-12: 485 pg/mL (ref 200–1100)

## 2024-06-11 ENCOUNTER — Other Ambulatory Visit: Payer: Self-pay | Admitting: Family Medicine

## 2024-06-11 ENCOUNTER — Telehealth (HOSPITAL_COMMUNITY): Payer: Self-pay | Admitting: Cardiology

## 2024-06-11 ENCOUNTER — Encounter (HOSPITAL_COMMUNITY)

## 2024-06-11 DIAGNOSIS — E782 Mixed hyperlipidemia: Secondary | ICD-10-CM

## 2024-06-11 NOTE — Telephone Encounter (Signed)
 Patient called to question if its ok to decrease metoprolol  to 25 mg as discussed at last OV.  Currently taking 50 mg daily  -no vitals (no b/p cuff) Reports HR on apple watch is 55 on average  Checked while on the phone 69  Please advise

## 2024-06-11 NOTE — Telephone Encounter (Signed)
 Pt aware and voiced understanding

## 2024-06-11 NOTE — Telephone Encounter (Signed)
 At last office visit Dr. Zenaida reduced dose to 50 mg daily from 100 mg daily. I do not see documentation to reduce further unless he is having significant side effects.

## 2024-06-12 DIAGNOSIS — G4733 Obstructive sleep apnea (adult) (pediatric): Secondary | ICD-10-CM | POA: Diagnosis not present

## 2024-06-12 DIAGNOSIS — I5022 Chronic systolic (congestive) heart failure: Secondary | ICD-10-CM | POA: Diagnosis not present

## 2024-06-14 ENCOUNTER — Ambulatory Visit: Admitting: Cardiovascular Disease

## 2024-06-15 ENCOUNTER — Ambulatory Visit: Admitting: Family Medicine

## 2024-06-16 ENCOUNTER — Encounter: Payer: Self-pay | Admitting: Family Medicine

## 2024-06-16 ENCOUNTER — Ambulatory Visit: Admitting: Family Medicine

## 2024-06-16 VITALS — BP 119/70 | HR 69 | Temp 98.1°F | Ht 70.0 in | Wt 238.4 lb

## 2024-06-16 DIAGNOSIS — F17211 Nicotine dependence, cigarettes, in remission: Secondary | ICD-10-CM

## 2024-06-16 DIAGNOSIS — E118 Type 2 diabetes mellitus with unspecified complications: Secondary | ICD-10-CM

## 2024-06-16 DIAGNOSIS — E782 Mixed hyperlipidemia: Secondary | ICD-10-CM | POA: Diagnosis not present

## 2024-06-16 DIAGNOSIS — Z7984 Long term (current) use of oral hypoglycemic drugs: Secondary | ICD-10-CM | POA: Diagnosis not present

## 2024-06-16 DIAGNOSIS — I739 Peripheral vascular disease, unspecified: Secondary | ICD-10-CM | POA: Diagnosis not present

## 2024-06-16 DIAGNOSIS — J449 Chronic obstructive pulmonary disease, unspecified: Secondary | ICD-10-CM | POA: Diagnosis not present

## 2024-06-16 DIAGNOSIS — I10 Essential (primary) hypertension: Secondary | ICD-10-CM

## 2024-06-16 DIAGNOSIS — Z0001 Encounter for general adult medical examination with abnormal findings: Secondary | ICD-10-CM

## 2024-06-16 DIAGNOSIS — I2583 Coronary atherosclerosis due to lipid rich plaque: Secondary | ICD-10-CM | POA: Diagnosis not present

## 2024-06-16 DIAGNOSIS — I2510 Atherosclerotic heart disease of native coronary artery without angina pectoris: Secondary | ICD-10-CM

## 2024-06-16 DIAGNOSIS — Z0000 Encounter for general adult medical examination without abnormal findings: Secondary | ICD-10-CM

## 2024-06-16 DIAGNOSIS — I480 Paroxysmal atrial fibrillation: Secondary | ICD-10-CM | POA: Diagnosis not present

## 2024-06-16 MED ORDER — BLOOD GLUCOSE MONITORING SUPPL DEVI: 1.0000 | 0 refills | Status: AC

## 2024-06-16 MED ORDER — LANCET DEVICE MISC: 1.0000 | 0 refills | Status: AC

## 2024-06-16 MED ORDER — BLOOD GLUCOSE TEST VI STRP: 1.0000 | ORAL_STRIP | 0 refills | Status: AC

## 2024-06-16 MED ORDER — LANCETS MISC: 1.0000 | 0 refills | Status: AC

## 2024-06-16 NOTE — Progress Notes (Signed)
 Complete physical exam  Patient: Ian [name]   DOB: June 21, 1971   53 y.o. Male  MRN: [id number]  Subjective:    Chief Complaint  Patient presents with   Follow-up    2 mo f/u    Ian [name] is a 53 y.o. male who presents today for a complete physical exam. He reports consuming a general diet. The patient does not participate in regular exercise at present. He generally feels well. He reports sleeping well. He does not have additional problems to discuss today.   Patient is in today for chronic condition management and CPE. He does see cardiology and EP for his PAF, CHF, HLD, HTN, CAD, and PAD. Has established with Pulmonology for his dyspnea with exertion and history of nicotine  dependence and lung nodules.    DM2: not checking, A1c 5.5%, on Metformin , Farxiga , Glipizide , Ozempic  Denies polyuria, polydypsia, paresthesias, or chest pain. Lab Results  Component Value Date   HGBA1C 5.5 06/09/2024   HGBA1C 8.0 (H) 02/17/2024   HGBA1C 8.9 (H) 06/09/2023    PAF: on Eliquis  5mg  BID for CHADS2VASC 4 and Plavix  5mg  daily, Tikosyn  250mcg BID, Lopressor  25mg  PRN, followed by Cardiology, recent ablation by Dr Cindie 03/19/2024   HLD: on Rosuvastatin  20mg  daily, LDL at goal <70 Lipid Panel     Component Value Date/Time   CHOL 92 06/09/2024 0816   CHOL 95 (L) 07/20/2019 1026   TRIG 199 (H) 06/09/2024 0816   HDL 29 (L) 06/09/2024 0816   HDL 27 (L) 07/20/2019 1026   CHOLHDL 3.2 06/09/2024 0816   VLDL UNABLE TO CALCULATE IF TRIGLYCERIDE OVER 400 mg/dL 89/89/7983 9766   LDLCALC 36 06/09/2024 0816   LABVLDL 28 07/20/2019 1026    HTN: followed by Cardiology, on Metoprolol  100mg  daily, Entresto  24-26mg  daily, Spironolactone  25mg  daily,    COPD: has quit smoking!, established with pulm, LDCT due 02/2024, not requiring Albuterol  or inhalers   Chronic systolic CHF: EF 59-54% on 12/16/23 ECHO, lasix , spironolactone    CAD: s/p RCA stent 2016, 2018, nonischemic myocardial perfusion study  07/18/2023, ECHO 12/12/2023 with EF 40-45%   OSA on CPAP: non-compliant with CPAP, advised to follow up on treatment options, recent sleep study 02/02/24   PAD: s/p stent to left common iliac artery 2021, recent peripheral angiogram   Right knee pain: osteoarthritis on x-ray. Has tried voltaren. Declines PT at this time.   Discussed the use of AI scribe software for clinical note transcription with the patient, who gave verbal consent to proceed.  History of Present Illness Ian [name] is a 53 year old male with diabetes and coronary artery disease who presents for an annual physical exam.  He has been experiencing low blood sugar levels, ranging from 70 to 90 mg/dL, with a recent reading of 64 mg/dL during a hospital visit where he received sugar water. He has not been consistently monitoring his blood sugar levels. He has experienced significant weight loss, from 250 lbs to 238 lbs, and his A1c has decreased to 5.5 from a previous 8 or 9.  He recently underwent a procedure on his left leg due to a 98% blockage, which was not completed. The procedure involved accessing the artery through the right groin. He is scheduled to see a specialist on the 13th for further evaluation.  He is currently taking multiple medications including Plavix , Eliquis , Farxiga , Tikosyn , Lasix , spironolactone , gabapentin , metformin , metoprolol , omega-3, Ozempic , rosuvastatin , Entresto , and Viagra . He takes gabapentin  for leg pain, three pills at night, and metoprolol  50  mg daily with an additional 25 mg as needed for heart rate above 100 bpm, which he has never used. He has not needed to use nitroglycerin .  He has a history of smoking but has been smoke-free for a month and a half, although he continues to vape occasionally. He has been smoking since age 32 and previously smoked two and a half packs a day.  No shortness of breath, lightheadedness, dizziness, headaches, vision changes, chest pain, palpitations, or  swelling in his arms or legs. No nausea, vomiting, diarrhea, constipation, or blood in his stool.  He has a history of lung nodules and has seen a lung specialist. He has not had a diabetic eye exam recently but plans to have one at his next eye doctor visit.  He does not have teeth or dentures due to complications following dental extractions and has not pursued further dental care. He reports no issues with eating despite this.  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     Most recent fall risk assessment:    06/16/2024   10:28 AM  Fall Risk   Falls in the past year? 0  Number falls in past yr: 0  Injury with Fall? 0  Risk for fall due to : No Fall Risks  Follow up Falls evaluation completed     Most recent depression screenings:    06/16/2024   10:28 AM 03/16/2024    3:16 PM  PHQ 2/9 Scores  PHQ - 2 Score 0 0    Vision:Within last year  Patient Active Problem List   Diagnosis Date Noted   Morbid obesity (HCC) 06/16/2024   Former cigarette smoker 04/28/2024   Longstanding persistent atrial fibrillation (HCC) 02/03/2024   Chronic obstructive pulmonary disease (HCC) 06/09/2023   Cigarette nicotine  dependence without complication 06/09/2023   Physical exam, annual 06/09/2023   Claudication in peripheral vascular disease 03/27/2020   Peripheral arterial disease 03/15/2020   Dyspnea on exertion 10/27/2018   Abnormal nuclear stress test 09/06/2016   S/P PTCA (percutaneous transluminal coronary angioplasty)    PAF (paroxysmal atrial fibrillation) (HCC) 12/19/2015   Essential hypertension 12/19/2015   Coronary artery disease due to lipid rich plaque 12/19/2015   Ischemic cardiomyopathy 12/19/2015   Hyperlipidemia 05/21/2015   Type 2 diabetes mellitus with complication, without long-term current use of insulin  (HCC) 05/21/2015   Tobacco abuse 05/21/2015   Past Medical History:  Diagnosis Date   Arthritis    left ankle  (09/04/2016)   Cardiomyopathy (HCC)    a. EF 40-45% by prior echo. .b. 50-55% in 02/2020.   Complication of anesthesia    hard to get me out of it sometimes; usually takes me longer to get under   Coronary artery disease    a. multiple PCIs to RCA.   Essential hypertension 12/19/2015   Hyperlipidemia 05/21/2015   Hypertension    NSTEMI (non-ST elevated myocardial infarction) (HCC) 05/2015   thelbert 05/23/2015 minor one   PAD (peripheral artery disease)    PAF (paroxysmal atrial fibrillation) (HCC)    Tobacco abuse 05/21/2015   Type II diabetes mellitus (HCC)    Past Surgical History:  Procedure Laterality Date   ABDOMINAL AORTOGRAM W/LOWER EXTREMITY Bilateral 03/27/2020   Procedure: ABDOMINAL AORTOGRAM W/LOWER EXTREMITY;  Surgeon: Court Dorn PARAS, MD;  Location: [facility] INVASIVE CV LAB;  Service: Cardiovascular;  Laterality: Bilateral;   ATRIAL FIBRILLATION ABLATION N/A 03/19/2024   Procedure: ATRIAL FIBRILLATION ABLATION;  Surgeon: Cindie,  Ole DASEN, MD;  Location: [facility];  Service: Cardiovascular;  Laterality: N/A;   CARDIAC CATHETERIZATION  2000, 2005   less than 20% blockage   CARDIAC CATHETERIZATION N/A 05/22/2015   Procedure: Left Heart Cath and Coronary Angiography;  Surgeon: Candyce GORMAN Reek, MD;  Location: [facility] INVASIVE CV LAB;  Service: Cardiovascular;  Laterality: N/A;   CARDIAC CATHETERIZATION N/A 05/22/2015   Procedure: Coronary Stent Intervention;  Surgeon: Candyce GORMAN Reek, MD;  Location: [facility] INVASIVE CV LAB;  Service: Cardiovascular;  Laterality: N/A;   CARDIAC CATHETERIZATION N/A 09/04/2016   Procedure: Left Heart Cath and Coronary Angiography;  Surgeon: Candyce GORMAN Reek, MD;  Location: [facility] INVASIVE CV LAB;  Service: Cardiovascular;  Laterality: N/A;   CARDIAC CATHETERIZATION N/A 09/04/2016   Procedure: Coronary Balloon Angioplasty;  Surgeon: Candyce GORMAN Reek, MD;  Location: [facility] INVASIVE CV LAB;  Service: Cardiovascular;  Laterality: N/A;   CARDIAC  CATHETERIZATION N/A 09/05/2016   Procedure: Coronary Atherectomy;  Surgeon: Alm LELON Clay, MD;  Location: [facility] INVASIVE CV LAB;  Service: Cardiovascular;  Laterality: N/A;   CARDIAC CATHETERIZATION N/A 09/05/2016   Procedure: Coronary Stent Intervention;  Surgeon: Alm LELON Clay, MD;  Location: [facility] INVASIVE CV LAB;  Service: Cardiovascular;  Laterality: N/A;   CARDIAC CATHETERIZATION N/A 09/05/2016   Procedure: Temporary Pacemaker;  Surgeon: Alm LELON Clay, MD;  Location: [facility] INVASIVE CV LAB;  Service: Cardiovascular;  Laterality: N/A;   CARDIOVERSION N/A 10/13/2023   Procedure: CARDIOVERSION;  Surgeon: Zenaida Morene PARAS, MD;  Location: [facility] INVASIVE CV LAB;  Service: Cardiovascular;  Laterality: N/A;   CORONARY ANGIOPLASTY     CORONARY PRESSURE/FFR STUDY N/A 10/16/2018   Procedure: INTRAVASCULAR PRESSURE WIRE/FFR STUDY;  Surgeon: Reek Candyce GORMAN, MD;  Location: [facility] INVASIVE CV LAB;  Service: Cardiovascular;  Laterality: N/A;   LEFT HEART CATH AND CORONARY ANGIOGRAPHY N/A 10/16/2018   Procedure: LEFT HEART CATH AND CORONARY ANGIOGRAPHY;  Surgeon: Reek Candyce GORMAN, MD;  Location: [facility] INVASIVE CV LAB;  Service: Cardiovascular;  Laterality: N/A;   LOWER EXTREMITY ANGIOGRAPHY N/A 06/03/2024   Procedure: Lower Extremity Angiography;  Surgeon: Court Dorn PARAS, MD;  Location: [facility] INVASIVE CV LAB;  Service: Cardiovascular;  Laterality: N/A;   MULTIPLE TOOTH EXTRACTIONS  2013   all of them   PERIPHERAL VASCULAR INTERVENTION  03/27/2020   Procedure: PERIPHERAL VASCULAR INTERVENTION;  Surgeon: Court Dorn PARAS, MD;  Location: [facility] INVASIVE CV LAB;  Service: Cardiovascular;;  Left iliac artery    TYMPANOSTOMY TUBE PLACEMENT Bilateral 1970s   Social History   Tobacco Use   Smoking status: Former    Average packs/day: 0.5 packs/day for 44.0 years (22.0 ttl pk-yrs)    Types: Cigarettes    Start date: 51   Smokeless tobacco: Never   Tobacco comments:    04/19/2024 Patient smokes a half of pack daily      Currently smoked .5ppd 02/11/24  Vaping Use   Vaping status: Every Day   Substances: Nicotine   Substance Use Topics   Alcohol use: Not Currently    Comment: 2025.....socially   Drug use: Not Currently    Types: Cocaine, Marijuana    Comment: 09/04/2016 haven't touched drugs since 1988   Family History  Problem Relation Age of Onset   Diabetes Mother    Heart attack Father    Diabetes Sister    Diabetes Maternal Grandmother    Allergies  Allergen Reactions   Atorvastatin  Other (See Comments)    Pt reports causes myalgias. Tolerating rosuvastatin  low dose  Patient Care Team: Kayla Jeoffrey RAMAN, FNP as PCP - General (Family Medicine) Court Dorn PARAS, MD as PCP - Cardiology (Cardiology) Cindie Ole DASEN, MD as PCP - Electrophysiology (Cardiology) Croitoru, Jerel, MD as Consulting Physician (Cardiology) Darlean Ozell NOVAK, MD as Consulting Physician (Pulmonary Disease)   Outpatient Medications Prior to Visit  Medication Sig Note   albuterol  (VENTOLIN  HFA) 108 (90 Base) MCG/ACT inhaler Inhale 1-2 puffs into the lungs every 6 (six) hours as needed for wheezing or shortness of breath.    CINNAMON PO Take 1,000 mg by mouth 2 (two) times daily.    clopidogrel  (PLAVIX ) 75 MG tablet Take 1 tablet (75 mg total) by mouth daily.    dapagliflozin  propanediol (FARXIGA ) 5 MG TABS tablet Take 1 tablet (5 mg total) by mouth daily.    dofetilide  (TIKOSYN ) 250 MCG capsule Take 1 capsule (250 mcg total) by mouth 2 (two) times daily.    ELIQUIS  5 MG TABS tablet Take 1 tablet by mouth twice daily    furosemide  (LASIX ) 40 MG tablet Take 40 mg by mouth daily.    gabapentin  (NEURONTIN ) 100 MG capsule START WITH 1 CAPSULE AND INCREASE TO 3 CAPSULES BY MOUTH NIGHTLY    metFORMIN  (GLUCOPHAGE ) 1000 MG tablet Take 1 tablet (1,000 mg total) by mouth 2 (two) times daily with a meal.    metoprolol  succinate (TOPROL -XL) 50 MG 24 hr tablet Take 1 tablet (50 mg total) by mouth daily. Take with or  immediately following a meal.    metoprolol  tartrate (LOPRESSOR ) 25 MG tablet Take 1 tablet (25 mg total) by mouth 2 (two) times daily as needed (HR >100).    nitroGLYCERIN  (NITROSTAT ) 0.4 MG SL tablet Place 0.4 mg under the tongue every 5 (five) minutes as needed for chest pain.    Omega 3 1000 MG CAPS Take 1,000 mg by mouth 2 (two) times daily.    OZEMPIC , 1 MG/DOSE, 4 MG/3ML SOPN INJECT 1 MG AS DIRECTED ONCE A WEEK    rosuvastatin  (CRESTOR ) 20 MG tablet Take 1 tablet by mouth once daily    sacubitril -valsartan  (ENTRESTO ) 24-26 MG TAKE 1 TABLET BY MOUTH TWICE DAILY. START THIS ON THURSDAY 2/13    sildenafil  (VIAGRA ) 50 MG tablet Take 1 tablet (50 mg total) by mouth daily as needed for erectile dysfunction.    spironolactone  (ALDACTONE ) 25 MG tablet Take 1 tablet by mouth once daily    [DISCONTINUED] aspirin  81 MG chewable tablet Chew by mouth once. (Patient taking differently: Chew 81 mg by mouth as needed (for surgery when he stopps other medications).)    [DISCONTINUED] Blood Glucose Monitoring Suppl DEVI 1 each by Does not apply route in the morning, at noon, and at bedtime. May substitute to any manufacturer covered by patient's insurance. 06/16/2024: lancets and strips   [DISCONTINUED] glipiZIDE  (GLUCOTROL ) 10 MG tablet TAKE 1 TABLET BY MOUTH TWICE DAILY BEFORE A MEAL    pantoprazole  (PROTONIX ) 40 MG tablet Take 1 tablet (40 mg total) by mouth daily. (Patient not taking: Reported on 06/16/2024)    [DISCONTINUED] colchicine  0.6 MG tablet Take 1 tablet (0.6 mg total) by mouth 2 (two) times daily for 5 days. (Patient not taking: Reported on 06/16/2024)    No facility-administered medications prior to visit.    Review of Systems  Constitutional: Negative.   HENT: Negative.    Eyes: Negative.   Respiratory: Negative.    Cardiovascular: Negative.   Gastrointestinal: Negative.   Genitourinary: Negative.   Musculoskeletal:  Positive for joint pain.  Skin: Negative.   Neurological: Negative.    Endo/Heme/Allergies: Negative.   Psychiatric/Behavioral: Negative.    All other systems reviewed and are negative.         Objective:     BP 119/70   Pulse 69   Temp 98.1 F (36.7 C)   Ht 5' 10 (1.778 m)   Wt 238 lb 6.4 oz (108.1 kg)   SpO2 100%   BMI 34.21 kg/m  BP Readings from Last 3 Encounters:  06/16/24 119/70  06/03/24 108/63  04/28/24 107/68   Wt Readings from Last 3 Encounters:  06/16/24 238 lb 6.4 oz (108.1 kg)  06/03/24 242 lb (109.8 kg)  04/28/24 242 lb (109.8 kg)      Physical Exam Vitals and nursing note reviewed.  Constitutional:      Appearance: Normal appearance. He is obese.  HENT:     Head: Normocephalic and atraumatic.     Right Ear: Tympanic membrane, ear canal and external ear normal.     Left Ear: Tympanic membrane, ear canal and external ear normal.     Nose: Nose normal.     Mouth/Throat:     Mouth: Mucous membranes are moist.     Dentition: Does not have dentures.     Pharynx: Oropharynx is clear.     Comments: edentulous Eyes:     Extraocular Movements: Extraocular movements intact.     Right eye: Normal extraocular motion and no nystagmus.     Left eye: Normal extraocular motion and no nystagmus.     Conjunctiva/sclera: Conjunctivae normal.     Pupils: Pupils are equal, round, and reactive to light.  Cardiovascular:     Rate and Rhythm: Normal rate and regular rhythm.     Pulses: Normal pulses.     Heart sounds: Normal heart sounds.  Pulmonary:     Effort: Pulmonary effort is normal.     Breath sounds: Normal breath sounds.  Abdominal:     General: Bowel sounds are normal.     Palpations: Abdomen is soft.  Genitourinary:    Comments: Deferred using shared decision making Musculoskeletal:        General: Normal range of motion.     Cervical back: Normal range of motion and neck supple.  Skin:    General: Skin is warm and dry.     Capillary Refill: Capillary refill takes less than 2 seconds.  Neurological:      General: No focal deficit present.     Mental Status: He is alert. Mental status is at baseline.  Psychiatric:        Mood and Affect: Mood normal.        Speech: Speech normal.        Behavior: Behavior normal.        Thought Content: Thought content normal.        Cognition and Memory: Cognition and memory normal.        Judgment: Judgment normal.      No results found for any visits on 06/16/24. Last CBC Lab Results  Component Value Date   WBC 6.2 05/14/2024   HGB 15.5 05/14/2024   HCT 47.4 05/14/2024   MCV 99 (H) 05/14/2024   MCH 32.5 05/14/2024   RDW 12.5 05/14/2024   PLT 159 05/14/2024   Last metabolic panel Lab Results  Component Value Date   GLUCOSE 112 (H) 06/09/2024   NA 137 06/09/2024   K 4.3 06/09/2024   CL 98 06/09/2024   CO2 29  06/09/2024   BUN 20 06/09/2024   CREATININE 0.97 06/09/2024   EGFR 92 06/09/2024   CALCIUM  10.1 06/09/2024   PROT 7.1 06/09/2024   ALBUMIN 4.1 07/18/2023   LABGLOB 2.2 07/20/2019   AGRATIO 2.0 07/20/2019   BILITOT 0.6 06/09/2024   ALKPHOS 57 07/18/2023   AST 13 06/09/2024   ALT 18 06/09/2024   ANIONGAP 10 02/06/2024   Last lipids Lab Results  Component Value Date   CHOL 92 06/09/2024   HDL 29 (L) 06/09/2024   LDLCALC 36 06/09/2024   TRIG 199 (H) 06/09/2024   CHOLHDL 3.2 06/09/2024   Last hemoglobin A1c Lab Results  Component Value Date   HGBA1C 5.5 06/09/2024   Last thyroid functions Lab Results  Component Value Date   TSH 1.130 07/20/2019   Last vitamin D No results found for: 25OHVITD2, 25OHVITD3, VD25OH Last vitamin B12 and Folate Lab Results  Component Value Date   VITAMINB12 485 06/09/2024        Assessment & Plan:    Routine Health Maintenance and Physical Exam  Immunization History  Administered Date(s) Administered   Influenza,inj,quad, With Preservative 03/01/2021   Pneumococcal Polysaccharide-23 09/10/2016   Tdap 01/03/2017, 04/19/2019   Unspecified SARS-COV-2 Vaccination  11/06/2019    Health Maintenance  Topic Date Due   OPHTHALMOLOGY EXAM  06/16/2024 (Originally 05/04/2024)   COVID-19 Vaccine (2 - Mixed Product risk series) 07/02/2024 (Originally 12/04/2019)   Zoster Vaccines- Shingrix (1 of 2) 09/16/2024 (Originally 01/09/1988)   Influenza Vaccine  11/09/2024 (Originally 03/12/2024)   Hepatitis B Vaccines 19-59 Average Risk (1 of 3 - 19+ 3-dose series) 02/16/2025 (Originally 01/09/1988)   Lung Cancer Screening  06/16/2025 (Originally 01/09/2019)   Pneumococcal Vaccine: 50+ Years (2 of 2 - PCV) 06/16/2025 (Originally 09/10/2017)   HEMOGLOBIN A1C  12/08/2024   FOOT EXAM  02/16/2025   Diabetic kidney evaluation - eGFR measurement  06/09/2025   Diabetic kidney evaluation - Urine ACR  06/09/2025   Fecal DNA (Cologuard)  03/06/2027   DTaP/Tdap/Td (3 - Td or Tdap) 04/18/2029   Hepatitis C Screening  Completed   HIV Screening  Completed   HPV VACCINES  Aged Out   Meningococcal B Vaccine  Aged Out    Discussed health benefits of physical activity, and encouraged him to engage in regular exercise appropriate for his age and condition.  Problem List Items Addressed This Visit     Hyperlipidemia (Chronic)   Type 2 diabetes mellitus with complication, without long-term current use of insulin  (HCC) (Chronic)   PAF (paroxysmal atrial fibrillation) (HCC)   Essential hypertension   Coronary artery disease due to lipid rich plaque   Peripheral arterial disease   Chronic obstructive pulmonary disease (HCC)   Cigarette nicotine  dependence without complication   Physical exam, annual - Primary   Morbid obesity (HCC)   Assessment and Plan Assessment & Plan Adult Wellness Visit High risk for severe illness if contracting pneumonia due to multiple medical conditions. - Consider shingles and pneumonia vaccines.  Type 2 diabetes mellitus Well-controlled with A1c of 5.5%. Recent weight loss and low blood glucose levels noted. Discontinued glipizide  to prevent  hypoglycemia. - Discontinue glipizide . - Continue metformin  and Farxiga . - Recheck A1c in 3 months. - Monitor blood glucose levels at home, ensuring he does not exceed 120 mg/dL. - Ensure adequate supply of lancets and strips for blood glucose monitoring.  Atherosclerotic peripheral arterial disease of left lower extremity Severe disease with 98% blockage. Recent procedure incomplete. Follow-up with specialist scheduled. - Follow  up with specialist on November 13th for further management.  Atherosclerotic heart disease of native coronary artery Continued management with Plavix  and Eliquis  as per cardiologist's recommendation. - Continue Plavix  and Eliquis  as prescribed.  Paroxysmal atrial fibrillation Managed with Tikosyn  and Eliquis . No recent episodes reported. - Continue Tikosyn  and Eliquis  as prescribed.  Essential hypertension - Continue current antihypertensive regimen.  Mixed hyperlipidemia Managed with rosuvastatin . Cholesterol at goal, triglycerides slightly elevated. - Continue rosuvastatin  and omega-3 supplements.  Nicotine  dependence, in remission In remission. Quit smoking for a month and a half, still vaping occasionally. - Encourage complete cessation of vaping. - repeat CT in July 2026  Edentulism No current dental appliances in use. - Encourage follow-up with a dentist for evaluation and potential fitting of dentures.    Return for diabetes f/u with A1c week prior.     Jeoffrey GORMAN Barrio, FNP

## 2024-06-21 ENCOUNTER — Encounter: Payer: Self-pay | Admitting: Student

## 2024-06-21 ENCOUNTER — Encounter: Payer: Self-pay | Admitting: Family Medicine

## 2024-06-21 ENCOUNTER — Ambulatory Visit: Attending: Student | Admitting: Student

## 2024-06-21 VITALS — BP 124/74 | HR 76 | Ht 70.0 in | Wt 234.0 lb

## 2024-06-21 DIAGNOSIS — I2583 Coronary atherosclerosis due to lipid rich plaque: Secondary | ICD-10-CM | POA: Diagnosis not present

## 2024-06-21 DIAGNOSIS — Z79899 Other long term (current) drug therapy: Secondary | ICD-10-CM | POA: Diagnosis not present

## 2024-06-21 DIAGNOSIS — Z5181 Encounter for therapeutic drug level monitoring: Secondary | ICD-10-CM | POA: Insufficient documentation

## 2024-06-21 DIAGNOSIS — I2510 Atherosclerotic heart disease of native coronary artery without angina pectoris: Secondary | ICD-10-CM | POA: Insufficient documentation

## 2024-06-21 DIAGNOSIS — I739 Peripheral vascular disease, unspecified: Secondary | ICD-10-CM | POA: Diagnosis not present

## 2024-06-21 DIAGNOSIS — I4811 Longstanding persistent atrial fibrillation: Secondary | ICD-10-CM | POA: Insufficient documentation

## 2024-06-21 DIAGNOSIS — I480 Paroxysmal atrial fibrillation: Secondary | ICD-10-CM | POA: Insufficient documentation

## 2024-06-21 DIAGNOSIS — D6869 Other thrombophilia: Secondary | ICD-10-CM | POA: Insufficient documentation

## 2024-06-21 DIAGNOSIS — Z720 Tobacco use: Secondary | ICD-10-CM | POA: Diagnosis not present

## 2024-06-21 NOTE — Progress Notes (Signed)
  Electrophysiology Office Note:   Date:  06/21/2024  ID:  Ian [name], DOB [birth year], MRN [id number]  Primary Cardiologist: Dorn Lesches, MD Electrophysiologist: Ian ONEIDA HOLTS, MD  -> Dr. Almetta     History of Present Illness:   Ian [name] is a 53 y.o. male with h/o CAD, chronic systolic CHF, HLD, HTN, PAD, and (longstanding persistent suspected) atrial fibrillation seen today for routine electrophysiology follow-up s/p Ablation.  Underwent LE angiography 06/03/2024 which showed left common femoral artery with 90% stenosis. No intervention and referred to surgery.   Since last being seen in our clinic the patient reports doing OK. Had not realized he was back in AF. Taking medications as directed, did hold eliquis  for PAD intervention attempt. Otherwise,  he denies chest pain, palpitations, dyspnea, PND, orthopnea, nausea, vomiting, dizziness, syncope, edema, weight gain, or early satiety.    Review of systems complete and found to be negative unless listed in HPI.   EP Information / Studies Reviewed:    EKG is ordered today. Personal review as below.       Arrhythmia/Device History S/p Tikosyn  admission 01/2024 S/p PVI + posterior wall 03/19/2024   Physical Exam:   VS:  BP 124/74   Pulse 76   Ht 5' 10 (1.778 m)   Wt 234 lb (106.1 kg)   SpO2 95%   BMI 33.58 kg/m    Wt Readings from Last 3 Encounters:  06/21/24 234 lb (106.1 kg)  06/16/24 238 lb 6.4 oz (108.1 kg)  06/03/24 242 lb (109.8 kg)     GEN: No acute distress NECK: No JVD; No carotid bruits CARDIAC: Irregularly irregular rate and rhythm, no murmurs, rubs, gallops RESPIRATORY:  Clear to auscultation without rales, wheezing or rhonchi  ABDOMEN: Soft, non-tender, non-distended EXTREMITIES:  No edema; No deformity   ASSESSMENT AND PLAN:    Persistent AF Secondary hypercoagulable state EKG today shows AF Continue tikosyn  250 mcg BID Continue eliquis  5 mg BID for CHA2DS2VASc of at least 4    Discussed cardioversion, but pt held OAC for angiography and PAD procedure attempt, and is pending Dr. Lanis consult on Thursday to discuss further intervention.   Tentatively, would plan [facility] once he is back on his OAC for 4 weeks uninterrupted (and able to continue uninterrupted for 4 weeks post DCC).   If able to maintain NSR, Discussed possibility of re-do ablation as well.  If has quick return of AF, may have to consider rate control along at which point would stop AAs.     Will have him see Dr. Almetta to transition from Dr. Holts and discuss options.   High risk medication monitoring - Tikosyn  Patient requires ongoing monitoring for anti-arrhythmic medication which has the potential to cause life threatening arrhythmias or AV block. Labs today.   Follow up with Dr. Almetta in 6-8 weeks to transfer from Dr. Holts and discuss options for rhythm control.   Signed, Ozell Prentice Passey, [name]

## 2024-06-21 NOTE — Patient Instructions (Signed)
 Medication Instructions:  Your physician recommends that you continue on your current medications as directed. Please refer to the Current Medication list given to you today.  *If you need a refill on your cardiac medications before your next appointment, please call your pharmacy*  Lab Work: BMET, MAG-TODAY If you have labs (blood work) drawn today and your tests are completely normal, you will receive your results only by: MyChart Message (if you have MyChart) OR A paper copy in the mail If you have any lab test that is abnormal or we need to change your treatment, we will call you to review the results.  Follow-Up: At [facility], you and your health needs are our priority.  As part of our continuing mission to provide you with exceptional heart care, our providers are all part of one team.  This team includes your primary Cardiologist (physician) and Advanced Practice Providers or APPs (Physician Assistants and Nurse Practitioners) who all work together to provide you with the care you need, when you need it.  Your next appointment:   6-8 week(s)  Provider:   Donnice Primus, MD

## 2024-06-22 ENCOUNTER — Ambulatory Visit (INDEPENDENT_AMBULATORY_CARE_PROVIDER_SITE_OTHER): Admitting: Cardiovascular Disease

## 2024-06-22 ENCOUNTER — Encounter: Payer: Self-pay | Admitting: Cardiovascular Disease

## 2024-06-22 ENCOUNTER — Ambulatory Visit: Payer: Self-pay | Admitting: Student

## 2024-06-22 VITALS — BP 115/60 | HR 56 | Resp 17 | Ht 70.0 in | Wt 235.0 lb

## 2024-06-22 DIAGNOSIS — Z5181 Encounter for therapeutic drug level monitoring: Secondary | ICD-10-CM | POA: Diagnosis not present

## 2024-06-22 DIAGNOSIS — I2510 Atherosclerotic heart disease of native coronary artery without angina pectoris: Secondary | ICD-10-CM | POA: Diagnosis not present

## 2024-06-22 DIAGNOSIS — D6869 Other thrombophilia: Secondary | ICD-10-CM | POA: Diagnosis not present

## 2024-06-22 DIAGNOSIS — I2583 Coronary atherosclerosis due to lipid rich plaque: Secondary | ICD-10-CM | POA: Diagnosis not present

## 2024-06-22 DIAGNOSIS — Z720 Tobacco use: Secondary | ICD-10-CM

## 2024-06-22 DIAGNOSIS — I739 Peripheral vascular disease, unspecified: Secondary | ICD-10-CM

## 2024-06-22 DIAGNOSIS — I480 Paroxysmal atrial fibrillation: Secondary | ICD-10-CM | POA: Diagnosis not present

## 2024-06-22 DIAGNOSIS — Z79899 Other long term (current) drug therapy: Secondary | ICD-10-CM | POA: Diagnosis not present

## 2024-06-22 DIAGNOSIS — I4811 Longstanding persistent atrial fibrillation: Secondary | ICD-10-CM | POA: Diagnosis not present

## 2024-06-22 LAB — BASIC METABOLIC PANEL WITH GFR
BUN/Creatinine Ratio: 15 (ref 9–20)
BUN: 16 mg/dL (ref 6–24)
CO2: 22 mmol/L (ref 20–29)
Calcium: 10 mg/dL (ref 8.7–10.2)
Chloride: 98 mmol/L (ref 96–106)
Creatinine, Ser: 1.05 mg/dL (ref 0.76–1.27)
Glucose: 82 mg/dL (ref 70–99)
Potassium: 4.4 mmol/L (ref 3.5–5.2)
Sodium: 137 mmol/L (ref 134–144)
eGFR: 84 mL/min/1.73 (ref >59)

## 2024-06-22 LAB — MAGNESIUM: Magnesium: 2 mg/dL (ref 1.6–2.3)

## 2024-06-22 NOTE — Assessment & Plan Note (Signed)
 Patient appears to be back in A-fib at his A-fib clinic appointment yesterday.  He is unaware of this.  He did interrupt his oral anticoagulant coagulation for his recent peripheral vascular procedure.  He cannot have cardioversion until he has 4 weeks of uninterrupted oral anticoagulation.  He was seeing Dr. Cindie and is transitioning to Dr. Almetta.  He is on Tikosyn .  We will leave this to them to decide regarding DC cardioversion plus or minus repeat ablation.

## 2024-06-22 NOTE — Assessment & Plan Note (Signed)
 History of PAD status post left common iliac artery CTO intervention by myself 03/27/2020 with a VBX covered stent.  Because of recurrent claudication he had Dopplers performed 07/04/2023 suggested occlusion of his stent.  I performed angiography on him 06/03/2024 confirming left common iliac artery stent occlusion with 90% exophytic calcified left common femoral artery stenosis and three-vessel runoff.  I did not think he was an appropriate candidate for percutaneous revascularization.  I referred him to Dr. Silver, vein and vascular specialist for consideration of left common femoral artery endarterectomy with patch angioplasty plus or minus retrograde iliac stent recanalization versus femorofemoral crossover grafting.  He is cleared for this procedure at low risk given his low normal EF and low risk Myoview  last year.

## 2024-06-22 NOTE — Patient Instructions (Signed)
 Medication Instructions:  No changes  *If you need a refill on your cardiac medications before your next appointment, please call your pharmacy*   Lab Work: Not  needed If you have labs (blood work) drawn today and your tests are completely normal, you will receive your results only by: MyChart Message (if you have MyChart) OR A paper copy in the mail If you have any lab test that is abnormal or we need to change your treatment, we will call you to review the results.   Testing/Procedures:  Not needed  Follow-Up: At [facility], you and your health needs are our priority.  As part of our continuing mission to provide you with exceptional heart care, we have created designated Provider Care Teams.  These Care Teams include your primary Cardiologist (physician) and Advanced Practice Providers (APPs -  Physician Assistants and Nurse Practitioners) who all work together to provide you with the care you need, when you need it.     Your next appointment:   6 month(s)  The format for your next appointment:   In Person  Provider:   Dorn Lesches, MD

## 2024-06-22 NOTE — Progress Notes (Unsigned)
 Office Note     CC: Occluded left common iliac stent Requesting Provider:  Kayla Jeoffrey RAMAN, FNP  HPI: Ian [name] is a 53 y.o. (1971-03-25) male presenting at the request of Dr. Court for occluded left common iliac artery stent with tandem flow-limiting stenosis appreciated in the common femoral artery.  On exam, was doing well.  A native of [place] , he moved to [place]  after marrying a lady from [place] .  She stated the furthest she would move north was  .  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  He is currently working at [employer] part-time.  He has a history of left common iliac artery stent, which was found to be occluded several months ago.  He underwent angiography noting severe atherosclerotic disease in the left common femoral artery as well.  Ian [name] states he can walk roughly 20 yards prior to pain.  He is still able to grocery shop, and tries to fight through the pain, able to work, as long as he is not walking around the pizza shop, denies rest pain, tissue loss.  States he cannot do fun things in his life anymore like going to concerts, and has to stop prior to getting into his home as he cannot navigate 3 stairs due to the pain.  The pain he describes is mainly hip pain and thigh cramping.   The pt is  on a statin for cholesterol management.  The pt is  eliquis .   Other AC:  plavix  The pt is  on medication for hypertension.   The pt is  diabetic.  Tobacco hx:  former -no smoking over the last 29-month  Past Medical History:  Diagnosis Date   Arthritis    left ankle (09/04/2016)   Cardiomyopathy (HCC)    a. EF 40-45% by prior echo. .b. 50-55% in 02/2020.   Complication of anesthesia    hard to get me out of it sometimes; usually takes me longer to get under   Coronary artery disease    a. multiple PCIs to RCA.   Essential hypertension 12/19/2015   Hyperlipidemia 05/21/2015    Hypertension    NSTEMI (non-ST elevated myocardial infarction) (HCC) 05/2015   thelbert 05/23/2015 minor one   PAD (peripheral artery disease)    PAF (paroxysmal atrial fibrillation) (HCC)    Tobacco abuse 05/21/2015   Type II diabetes mellitus (HCC)     Past Surgical History:  Procedure Laterality Date   ABDOMINAL AORTOGRAM W/LOWER EXTREMITY Bilateral 03/27/2020   Procedure: ABDOMINAL AORTOGRAM W/LOWER EXTREMITY;  Surgeon: Court Dorn PARAS, MD;  Location: [facility] INVASIVE CV LAB;  Service: Cardiovascular;  Laterality: Bilateral;   ATRIAL FIBRILLATION ABLATION N/A 03/19/2024   Procedure: ATRIAL FIBRILLATION ABLATION;  Surgeon: Cindie Ole DASEN, MD;  Location: [facility] INVASIVE CV LAB;  Service: Cardiovascular;  Laterality: N/A;   CARDIAC CATHETERIZATION  2000, 2005   less than 20% blockage   CARDIAC CATHETERIZATION N/A 05/22/2015   Procedure: Left Heart Cath and Coronary Angiography;  Surgeon: Candyce RAMAN Reek, MD;  Location: [facility] INVASIVE CV LAB;  Service: Cardiovascular;  Laterality: N/A;   CARDIAC CATHETERIZATION N/A 05/22/2015   Procedure: Coronary Stent Intervention;  Surgeon: Candyce RAMAN Reek, MD;  Location: [facility] INVASIVE CV LAB;  Service: Cardiovascular;  Laterality: N/A;   CARDIAC CATHETERIZATION N/A 09/04/2016   Procedure: Left Heart Cath and Coronary Angiography;  Surgeon: Candyce RAMAN Reek, MD;  Location: [facility] INVASIVE CV  LAB;  Service: Cardiovascular;  Laterality: N/A;   CARDIAC CATHETERIZATION N/A 09/04/2016   Procedure: Coronary Balloon Angioplasty;  Surgeon: Candyce GORMAN Reek, MD;  Location: [facility] INVASIVE CV LAB;  Service: Cardiovascular;  Laterality: N/A;   CARDIAC CATHETERIZATION N/A 09/05/2016   Procedure: Coronary Atherectomy;  Surgeon: Alm LELON Clay, MD;  Location: [facility] INVASIVE CV LAB;  Service: Cardiovascular;  Laterality: N/A;   CARDIAC CATHETERIZATION N/A 09/05/2016   Procedure: Coronary Stent Intervention;  Surgeon: Alm LELON Clay, MD;  Location: [facility] INVASIVE CV LAB;   Service: Cardiovascular;  Laterality: N/A;   CARDIAC CATHETERIZATION N/A 09/05/2016   Procedure: Temporary Pacemaker;  Surgeon: Alm LELON Clay, MD;  Location: [facility] INVASIVE CV LAB;  Service: Cardiovascular;  Laterality: N/A;   CARDIOVERSION N/A 10/13/2023   Procedure: CARDIOVERSION;  Surgeon: Zenaida Morene PARAS, MD;  Location: [facility] INVASIVE CV LAB;  Service: Cardiovascular;  Laterality: N/A;   CORONARY ANGIOPLASTY     CORONARY PRESSURE/FFR STUDY N/A 10/16/2018   Procedure: INTRAVASCULAR PRESSURE WIRE/FFR STUDY;  Surgeon: Reek Candyce GORMAN, MD;  Location: [facility] INVASIVE CV LAB;  Service: Cardiovascular;  Laterality: N/A;   LEFT HEART CATH AND CORONARY ANGIOGRAPHY N/A 10/16/2018   Procedure: LEFT HEART CATH AND CORONARY ANGIOGRAPHY;  Surgeon: Reek Candyce GORMAN, MD;  Location: [facility] INVASIVE CV LAB;  Service: Cardiovascular;  Laterality: N/A;   LOWER EXTREMITY ANGIOGRAPHY N/A 06/03/2024   Procedure: Lower Extremity Angiography;  Surgeon: Court Dorn PARAS, MD;  Location: [facility] INVASIVE CV LAB;  Service: Cardiovascular;  Laterality: N/A;   MULTIPLE TOOTH EXTRACTIONS  2013   all of them   PERIPHERAL VASCULAR INTERVENTION  03/27/2020   Procedure: PERIPHERAL VASCULAR INTERVENTION;  Surgeon: Court Dorn PARAS, MD;  Location: [facility] INVASIVE CV LAB;  Service: Cardiovascular;;  Left iliac artery    TYMPANOSTOMY TUBE PLACEMENT Bilateral 1970s    Social History   Socioeconomic History   Marital status: Divorced    Spouse name: Not on file   Number of children: 3   Years of education: Not on file   Highest education level: Not on file  Occupational History   Not on file  Tobacco Use   Smoking status: Former    Current packs/day: 0.00    Average packs/day: 0.5 packs/day for 44.0 years (22.0 ttl pk-yrs)    Types: Cigarettes    Start date: 51    Quit date: 2025    Years since quitting: 0.8   Smokeless tobacco: Never   Tobacco comments:    04/19/2024 Patient smokes a half of pack daily     Currently smoked  .5ppd 02/11/24  Vaping Use   Vaping status: Every Day   Substances: Nicotine   Substance and Sexual Activity   Alcohol use: Not Currently    Comment: 2025.....socially   Drug use: Not Currently    Types: Cocaine, Marijuana    Comment: 09/04/2016 haven't touched drugs since 1988   Sexual activity: Yes  Other Topics Concern   Not on file  Social History Narrative   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 [place]       2 stents to heart 2016, 2017   1 stent to LLE 2021   Social Drivers of Health   Financial Resource Strain: Not on file  Food Insecurity: No Food Insecurity (02/03/2024)   Hunger Vital Sign    Worried About Running Out of Food in the Last Year: Never true    Ran Out of Food in the Last Year: Never true  Transportation Needs: No  Transportation Needs (02/03/2024)   PRAPARE - Administrator, Civil Service (Medical): No    Lack of Transportation (Non-Medical): No  Physical Activity: Not on file  Stress: Not on file  Social Connections: Not on file  Intimate Partner Violence: Not At Risk (02/03/2024)   Humiliation, Afraid, Rape, and Kick questionnaire    Fear of Current or Ex-Partner: No    Emotionally Abused: No    Physically Abused: No    Sexually Abused: No   Family History  Problem Relation Age of Onset   Diabetes Mother    Heart attack Father    Diabetes Sister    Diabetes Maternal Grandmother     Current Outpatient Medications  Medication Sig Dispense Refill   albuterol  (VENTOLIN  HFA) 108 (90 Base) MCG/ACT inhaler Inhale 1-2 puffs into the lungs every 6 (six) hours as needed for wheezing or shortness of breath. 10 each 6   Blood Glucose Monitoring Suppl DEVI 1 each by Does not apply route as directed. Dispense based on patient and insurance preference. Use up to four times daily as directed. (FOR ICD-10 E10.9, E11.9). 1 each 0   CINNAMON PO Take 1,000 mg by mouth 2 (two) times daily.     clopidogrel  (PLAVIX ) 75 MG tablet Take 1 tablet  (75 mg total) by mouth daily. 90 tablet 3   dapagliflozin  propanediol (FARXIGA ) 5 MG TABS tablet Take 1 tablet (5 mg total) by mouth daily. 30 tablet 11   dofetilide  (TIKOSYN ) 250 MCG capsule Take 1 capsule (250 mcg total) by mouth 2 (two) times daily. 180 capsule 6   ELIQUIS  5 MG TABS tablet Take 1 tablet by mouth twice daily 180 tablet 0   furosemide  (LASIX ) 40 MG tablet Take 40 mg by mouth daily.     gabapentin  (NEURONTIN ) 100 MG capsule START WITH 1 CAPSULE AND INCREASE TO 3 CAPSULES BY MOUTH NIGHTLY (Patient taking differently: Take 300 mg by mouth daily.) 90 capsule 0   Glucose Blood (BLOOD GLUCOSE TEST STRIPS) STRP 1 each by Does not apply route as directed. Dispense based on patient and insurance preference. Use up to four times daily as directed. (FOR ICD-10 E10.9, E11.9). 100 strip 0   Lancet Device MISC 1 each by Does not apply route as directed. Dispense based on patient and insurance preference. Use up to four times daily as directed. (FOR ICD-10 E10.9, E11.9). 1 each 0   Lancets MISC 1 each by Does not apply route as directed. Dispense based on patient and insurance preference. Use up to four times daily as directed. (FOR ICD-10 E10.9, E11.9). 100 each 0   metFORMIN  (GLUCOPHAGE ) 1000 MG tablet Take 1 tablet (1,000 mg total) by mouth 2 (two) times daily with a meal. 180 tablet 3   metoprolol  succinate (TOPROL -XL) 50 MG 24 hr tablet Take 1 tablet (50 mg total) by mouth daily. Take with or immediately following a meal. 90 tablet 3   metoprolol  tartrate (LOPRESSOR ) 25 MG tablet Take 1 tablet (25 mg total) by mouth 2 (two) times daily as needed (HR >100). 60 tablet 11   nitroGLYCERIN  (NITROSTAT ) 0.4 MG SL tablet Place 0.4 mg under the tongue every 5 (five) minutes as needed for chest pain.     Omega 3 1000 MG CAPS Take 1,000 mg by mouth 2 (two) times daily.     OZEMPIC , 1 MG/DOSE, 4 MG/3ML SOPN INJECT 1 MG AS DIRECTED ONCE A WEEK 3 mL 0   pantoprazole  (PROTONIX ) 40 MG tablet Take 1  tablet (40  mg total) by mouth daily. 45 tablet 0   rosuvastatin  (CRESTOR ) 20 MG tablet Take 1 tablet by mouth once daily 90 tablet 0   sacubitril -valsartan  (ENTRESTO ) 24-26 MG TAKE 1 TABLET BY MOUTH TWICE DAILY. START THIS ON THURSDAY 2/13 60 tablet 11   sildenafil  (VIAGRA ) 50 MG tablet Take 1 tablet (50 mg total) by mouth daily as needed for erectile dysfunction. 10 tablet 6   spironolactone  (ALDACTONE ) 25 MG tablet Take 1 tablet by mouth once daily 30 tablet 11   No current facility-administered medications for this visit.    Allergies  Allergen Reactions   Atorvastatin  Other (See Comments)    Pt reports causes myalgias. Tolerating rosuvastatin  low dose     REVIEW OF SYSTEMS:  [X]  denotes positive finding, [ ]  denotes negative finding Cardiac  Comments:  Chest pain or chest pressure:    Shortness of breath upon exertion:    Short of breath when lying flat:    Irregular heart rhythm:        Vascular    Pain in calf, thigh, or hip brought on by ambulation:    Pain in feet at night that wakes you up from your sleep:     Blood clot in your veins:    Leg swelling:         Pulmonary    Oxygen at home:    Productive cough:     Wheezing:         Neurologic    Sudden weakness in arms or legs:     Sudden numbness in arms or legs:     Sudden onset of difficulty speaking or slurred speech:    Temporary loss of vision in one eye:     Problems with dizziness:         Gastrointestinal    Blood in stool:     Vomited blood:         Genitourinary    Burning when urinating:     Blood in urine:        Psychiatric    Major depression:         Hematologic    Bleeding problems:    Problems with blood clotting too easily:        Skin    Rashes or ulcers:        Constitutional    Fever or chills:      PHYSICAL EXAMINATION:  There were no vitals filed for this visit.  General:  WDWN in NAD; vital signs documented above Gait: Not observed HENT: WNL, normocephalic Pulmonary: normal  non-labored breathing , without wheezing Cardiac: regular HR Abdomen: soft, NT, no masses Skin: without rashes Vascular Exam/Pulses:  Right Left  Radial 2+ (normal) 2+ (normal)  Ulnar    Femoral    Popliteal    DP 2+ (normal) nonpalpable  PT     Extremities: without ischemic changes, without Gangrene , without cellulitis; without open wounds;  Musculoskeletal: no muscle wasting or atrophy  Neurologic: A&O X 3;  No focal weakness or paresthesias are detected Psychiatric:  The pt has Normal affect.   Non-Invasive Vascular Imaging:   Angiography demonstrates occlusion of the left sided common iliac artery stent, left common femoral artery with severe stenosis    ASSESSMENT/PLAN: Ian [name] is a 53 y.o. male presenting with lifestyle-limiting claudication and occluded left common iliac artery stent.  Imaging also notes severe calcific disease with greater than 90% stenosis of the left common femoral artery.  I had a long discussion with Ian [name] regarding the above.  I am happy that he is not having rest pain, but his symptoms are lifestyle-limiting -he is working 4 hours a day, and has to stand in place.  Grocery shopping is very painful.  Ian [name] and I discussed that with his atherosclerotic distribution he would need left common femoral endarterectomy, repeat stenting of the left common iliac artery.  He is aware that repeat iliac artery stenting would require kissing iliac stents as I would need to raise the bifurcation.  This is a bad problem at 53 years old.  He is aware that options include continued medical management versus surgery. With the blockages that he has I do not think his symptoms will improve.  We had a long detailed discussion regarding the risks and benefits of surgery.  Ian [name] was hesitant to move forward.  He asked for some time to weigh his options, which I think is very reasonable.  My plan is to call him in 1 month's time to assess his symptoms, and answer any  further questions he may have regarding intervention.  I asked him to continue his current medication regimen and call should any questions or concerns arise.  Fonda FORBES Rim, MD [facility] [phone]

## 2024-06-22 NOTE — Assessment & Plan Note (Signed)
 Discontinue 2 months ago

## 2024-06-22 NOTE — Progress Notes (Signed)
 06/22/2024 Ian [name]   [birth year]  [id number]  Primary Physician Kayla Jeoffrey RAMAN, FNP Primary Cardiologist: Dorn JINNY Lesches MD FACP, [place], [place], [place]  HPI:  Ian [name] is a 53 y.o.  moderately overweight separated Caucasian male father of 3 children, grandfather of 3 grandchildren who I last saw in the office 06/03/2024.  He was referred by Dr. Maranda, his cardiologist, for peripheral vascular evaluation because of lifestyle imaging claudication.  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  He has treated diabetes and hyperlipidemia.  He also has PAF on Eliquis .  His family history is remarkable for father who apparently had CAD.  He has had multiple coronary interventions dating back to 2016 when he had RCA stent.  He had orbital atherectomy his RCA restenting 09/05/2016.  He has had claudication for 4 years primarily on the left side with recent Doppler studies performed 03/06/2020 revealing a right ABI that was normal and a left of 0.53 with an occluded left common iliac artery.   I performed peripheral angiography on him 03/27/2020 revealing an occluded left common iliac artery which I was able to cross and stented with a VBX covered stent.  He was discharged home the following day.  His Dopplers performed 8/21 unfortunately, he does continue to smoke however.   I referred him to Dr. Zenaida for treatment of his cardiomyopathy for medication titration GDMT.  He underwent attempted DC cardioversion which was initially successful but reverted back to A-fib 1 week later.  He was sent to Dr. Cindie today for discussion about treatment options, rhythm versus rate control.  It was decided to pursue rhythm control with Tikosyn  bridge to ablation.  He had admission  for Tikosyn  loading which was successful in converting him to sinus rhythm.  He underwent A-fib ablation successfully by Dr. Cindie 1 month ago.   Since I saw him 2 weeks ago I performed peripheral angiography on him 06/03/2024 revealing an occluded left common iliac artery stent with 90% exophytic calcified left common femoral artery stenosis with three-vessel runoff.  I do not think he is a candidate for percutaneous intervention and have referred him to Dr. Lanis, vein and vascular specialist, for consideration of surgical revascularization including left common femoral endarterectomy and patch angioplasty plus or minus retrograde endovascular treatment of his occluded left common iliac artery stent versus femorofemoral crossover grafting.  Of note, he did stop smoking 2 months ago.  He did see the A-fib clinic yesterday and was noted to be back in A-fib.   Current Meds  Medication Sig   albuterol  (VENTOLIN  HFA) 108 (90 Base) MCG/ACT inhaler Inhale 1-2 puffs into the lungs every 6 (six) hours as needed for wheezing or shortness of breath.   CINNAMON PO Take 1,000 mg by mouth 2 (two) times daily.   clopidogrel  (PLAVIX ) 75 MG tablet Take 1 tablet (75 mg total) by mouth daily.   dapagliflozin  propanediol (FARXIGA ) 5 MG TABS tablet Take 1 tablet (5 mg total) by mouth daily.   dofetilide  (TIKOSYN ) 250 MCG capsule Take 1 capsule (250 mcg total) by mouth 2 (two) times daily.   ELIQUIS  5 MG TABS tablet Take 1 tablet by mouth twice daily   furosemide  (LASIX ) 40 MG tablet Take 40 mg by mouth daily.  gabapentin  (NEURONTIN ) 100 MG capsule START WITH 1 CAPSULE AND INCREASE TO 3 CAPSULES BY MOUTH NIGHTLY (Patient taking differently: Take 300 mg by mouth daily.)   metFORMIN  (GLUCOPHAGE ) 1000 MG tablet Take 1 tablet (1,000 mg total) by mouth 2 (two) times daily with a meal.   metoprolol  succinate (TOPROL -XL) 50 MG 24 hr tablet Take 1 tablet (50 mg total) by mouth daily. Take with or immediately following a  meal.   metoprolol  tartrate (LOPRESSOR ) 25 MG tablet Take 1 tablet (25 mg total) by mouth 2 (two) times daily as needed (HR >100).   nitroGLYCERIN  (NITROSTAT ) 0.4 MG SL tablet Place 0.4 mg under the tongue every 5 (five) minutes as needed for chest pain.   Omega 3 1000 MG CAPS Take 1,000 mg by mouth 2 (two) times daily.   OZEMPIC , 1 MG/DOSE, 4 MG/3ML SOPN INJECT 1 MG AS DIRECTED ONCE A WEEK   pantoprazole  (PROTONIX ) 40 MG tablet Take 1 tablet (40 mg total) by mouth daily.   rosuvastatin  (CRESTOR ) 20 MG tablet Take 1 tablet by mouth once daily   sacubitril -valsartan  (ENTRESTO ) 24-26 MG TAKE 1 TABLET BY MOUTH TWICE DAILY. START THIS ON THURSDAY 2/13   sildenafil  (VIAGRA ) 50 MG tablet Take 1 tablet (50 mg total) by mouth daily as needed for erectile dysfunction.   spironolactone  (ALDACTONE ) 25 MG tablet Take 1 tablet by mouth once daily     Allergies  Allergen Reactions   Atorvastatin  Other (See Comments)    Pt reports causes myalgias. Tolerating rosuvastatin  low dose    Social History   Socioeconomic History   Marital status: Divorced    Spouse name: Not on file   Number of children: 3   Years of education: Not on file   Highest education level: Not on file  Occupational History   Not on file  Tobacco Use   Smoking status: Former    Current packs/day: 0.00    Average packs/day: 0.5 packs/day for 44.0 years (22.0 ttl pk-yrs)    Types: Cigarettes    Start date: 71    Quit date: 2025    Years since quitting: 0.8   Smokeless tobacco: Never   Tobacco comments:    04/19/2024 Patient smokes a half of pack daily     Currently smoked .5ppd 02/11/24  Vaping Use   Vaping status: Every Day   Substances: Nicotine   Substance and Sexual Activity   Alcohol use: Not Currently    Comment: 2025.....socially   Drug use: Not Currently    Types: Cocaine, Marijuana    Comment: 09/04/2016 haven't touched drugs since 1988   Sexual activity: Yes  Other Topics Concern   Not on file  Social  History Narrative   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 [place]       2 stents to heart 2016, 2017   1 stent to LLE 2021   Social Drivers of Health   Financial Resource Strain: Not on file  Food Insecurity: No Food Insecurity (02/03/2024)   Hunger Vital Sign    Worried About Running Out of Food in the Last Year: Never true    Ran Out of Food in the Last Year: Never true  Transportation Needs: No Transportation Needs (02/03/2024)   PRAPARE - Administrator, Civil Service (Medical): No    Lack of Transportation (Non-Medical): No  Physical Activity: Not on file  Stress: Not on file  Social Connections: Not on file  Intimate Partner Violence: Not At Risk (02/03/2024)  Humiliation, Afraid, Rape, and Kick questionnaire    Fear of Current or Ex-Partner: No    Emotionally Abused: No    Physically Abused: No    Sexually Abused: No     Review of Systems: General: negative for chills, fever, night sweats or weight changes.  Cardiovascular: negative for chest pain, dyspnea on exertion, edema, orthopnea, palpitations, paroxysmal nocturnal dyspnea or shortness of breath Dermatological: negative for rash Respiratory: negative for cough or wheezing Urologic: negative for hematuria Abdominal: negative for nausea, vomiting, diarrhea, bright red blood per rectum, melena, or hematemesis Neurologic: negative for visual changes, syncope, or dizziness All other systems reviewed and are otherwise negative except as noted above.    Blood pressure 115/60, pulse (!) 56, resp. rate 17, height 5' 10 (1.778 m), weight 235 lb (106.6 kg), SpO2 98%.  General appearance: alert and no distress Neck: no adenopathy, no carotid bruit, no JVD, supple, symmetrical, trachea midline, and thyroid not enlarged, symmetric, no tenderness/mass/nodules Lungs: clear to auscultation bilaterally Heart: irregularly irregular rhythm Extremities: extremities normal, atraumatic, no cyanosis or  edema Pulses: Absent pedal pulses Skin: Skin color, texture, turgor normal. No rashes or lesions Neurologic: Grossly normal  EKG not performed today      ASSESSMENT AND PLAN:   Tobacco abuse Discontinue 2 months ago  Peripheral arterial disease History of PAD status post left common iliac artery CTO intervention by myself 03/27/2020 with a VBX covered stent.  Because of recurrent claudication he had Dopplers performed 07/04/2023 suggested occlusion of his stent.  I performed angiography on him 06/03/2024 confirming left common iliac artery stent occlusion with 90% exophytic calcified left common femoral artery stenosis and three-vessel runoff.  I did not think he was an appropriate candidate for percutaneous revascularization.  I referred him to Dr. Silver, vein and vascular specialist for consideration of left common femoral artery endarterectomy with patch angioplasty plus or minus retrograde iliac stent recanalization versus femorofemoral crossover grafting.  He is cleared for this procedure at low risk given his low normal EF and low risk Myoview  last year.  PAF (paroxysmal atrial fibrillation) (HCC) Patient appears to be back in A-fib at his A-fib clinic appointment yesterday.  He is unaware of this.  He did interrupt his oral anticoagulant coagulation for his recent peripheral vascular procedure.  He cannot have cardioversion until he has 4 weeks of uninterrupted oral anticoagulation.  He was seeing Dr. Cindie and is transitioning to Dr. Almetta.  He is on Tikosyn .  We will leave this to them to decide regarding DC cardioversion plus or minus repeat ablation.     Dorn DOROTHA Lesches MD FACP,FACC,FAHA, [facility] 06/22/2024 8:18 AM

## 2024-06-24 ENCOUNTER — Other Ambulatory Visit: Payer: Self-pay | Admitting: Family Medicine

## 2024-06-24 ENCOUNTER — Ambulatory Visit: Attending: Vascular Surgery | Admitting: Vascular Surgery

## 2024-06-24 ENCOUNTER — Encounter: Payer: Self-pay | Admitting: Vascular Surgery

## 2024-06-24 VITALS — BP 91/68 | HR 60 | Temp 97.9°F | Resp 18 | Ht 70.0 in | Wt 234.4 lb

## 2024-06-24 DIAGNOSIS — T82599A Other mechanical complication of unspecified cardiac and vascular devices and implants, initial encounter: Secondary | ICD-10-CM | POA: Diagnosis not present

## 2024-06-24 DIAGNOSIS — I739 Peripheral vascular disease, unspecified: Secondary | ICD-10-CM | POA: Insufficient documentation

## 2024-06-26 ENCOUNTER — Other Ambulatory Visit: Payer: Self-pay | Admitting: Family Medicine

## 2024-06-26 DIAGNOSIS — M542 Cervicalgia: Secondary | ICD-10-CM

## 2024-06-30 ENCOUNTER — Other Ambulatory Visit (HOSPITAL_COMMUNITY): Payer: Self-pay | Admitting: Cardiology

## 2024-07-13 DIAGNOSIS — H52223 Regular astigmatism, bilateral: Secondary | ICD-10-CM | POA: Diagnosis not present

## 2024-07-13 DIAGNOSIS — H524 Presbyopia: Secondary | ICD-10-CM | POA: Diagnosis not present

## 2024-07-20 ENCOUNTER — Encounter: Payer: Self-pay | Admitting: Cardiovascular Disease

## 2024-07-20 NOTE — Progress Notes (Unsigned)
 Office Note    HPI: Ian [name] is a 53 y.o. (Nov 30, 1970) male presenting via phone call visit with known left lower extremity claudication and occluded common iliac artery stent.    On exam, was doing well.  A native of [place] , he moved to [place]  after marrying a lady from [place] .  She stated the furthest she would move north was [place] .  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  He is currently working at [employer] part-time.  He was originally followed by my partner Dr. Court who placed a left common iliac stent, now occluded.  Most recent angiography demonstrated occluded stent, as well as severe atherosclerotic disease in the left common femoral artery.  Stent has been occluded for months.  At his last visit, Cathlyn stated he could walk roughly 20 yards prior to pain.  He is still able to grocery shop, and tries to fight through the pain, able to work, as long as he is not walking around the pizza shop, denies rest pain, tissue loss.  States he cannot do fun things in his life anymore like going to concerts, and has to stop prior to getting into his home as he cannot navigate 3 stairs due to the pain.  The pain he describes is mainly hip pain and thigh cramping.   The pt is  on a statin for cholesterol management.  The pt is  eliquis .   Other AC:  plavix  The pt is  on medication for hypertension.   The pt is  diabetic.  Tobacco hx:  former -no smoking over the last 71-month  Past Medical History:  Diagnosis Date   Arthritis    left ankle (09/04/2016)   Cardiomyopathy (HCC)    a. EF 40-45% by prior echo. .b. 50-55% in 02/2020.   Complication of anesthesia    hard to get me out of it sometimes; usually takes me longer to get under   Coronary artery disease    a. multiple PCIs to RCA.   Essential hypertension 12/19/2015   Hyperlipidemia 05/21/2015   Hypertension    NSTEMI (non-ST  elevated myocardial infarction) (HCC) 05/2015   thelbert 05/23/2015 minor one   PAD (peripheral artery disease)    PAF (paroxysmal atrial fibrillation) (HCC)    Tobacco abuse 05/21/2015   Type II diabetes mellitus (HCC)     Past Surgical History:  Procedure Laterality Date   ABDOMINAL AORTOGRAM W/LOWER EXTREMITY Bilateral 03/27/2020   Procedure: ABDOMINAL AORTOGRAM W/LOWER EXTREMITY;  Surgeon: Court Dorn PARAS, MD;  Location: [facility] INVASIVE CV LAB;  Service: Cardiovascular;  Laterality: Bilateral;   ATRIAL FIBRILLATION ABLATION N/A 03/19/2024   Procedure: ATRIAL FIBRILLATION ABLATION;  Surgeon: Cindie Ole DASEN, MD;  Location: [facility] INVAS[facility] CV LAB;  Service: Cardiovascular;  Laterality: N/A;   CARDIAC CATHETERIZATION  2000, 2005   less than 20% blockage   CARDIAC CATHETERIZATION N/A 05/22/2015   Procedure: Left Heart Cath and Coronary Angiography;  Surgeon: Candyce GORMAN Reek, MD;  Location: [facility] INVASIVE CV LAB;  Service: Cardiovascular;  Laterality: N/A;   CARDIAC CATHETERIZATION N/A 05/22/2015   Procedure: Coronary Stent Intervention;  Surgeon: Candyce GORMAN Reek, MD;  Location: [facility] INVASIVE CV LAB;  Service: Cardiovascular;  Laterality: N/A;   CARDIAC CATHETERIZATION N/A 09/04/2016   Procedure: Left Heart Cath and Coronary Angiography;  Surgeon: Candyce GORMAN Reek, MD;  Location: [facility] INVASIVE CV LAB;  Service: Cardiovascular;  Laterality: N/A;  CARDIAC CATHETERIZATION N/A 09/04/2016   Procedure: Coronary Balloon Angioplasty;  Surgeon: Candyce GORMAN Reek, MD;  Location: [facility] INVASIVE CV LAB;  Service: Cardiovascular;  Laterality: N/A;   CARDIAC CATHETERIZATION N/A 09/05/2016   Procedure: Coronary Atherectomy;  Surgeon: Alm LELON Clay, MD;  Location: [facility] INVASIVE CV LAB;  Service: Cardiovascular;  Laterality: N/A;   CARDIAC CATHETERIZATION N/A 09/05/2016   Procedure: Coronary Stent Intervention;  Surgeon: Alm LELON Clay, MD;  Location: [facility] INVASIVE CV LAB;  Service: Cardiovascular;  Laterality:  N/A;   CARDIAC CATHETERIZATION N/A 09/05/2016   Procedure: Temporary Pacemaker;  Surgeon: Alm LELON Clay, MD;  Location: [facility] INVASIVE CV LAB;  Service: Cardiovascular;  Laterality: N/A;   CARDIOVERSION N/A 10/13/2023   Procedure: CARDIOVERSION;  Surgeon: Zenaida Morene PARAS, MD;  Location: [facility] INVASIVE CV LAB;  Service: Cardiovascular;  Laterality: N/A;   CORONARY ANGIOPLASTY     CORONARY PRESSURE/FFR STUDY N/A 10/16/2018   Procedure: INTRAVASCULAR PRESSURE WIRE/FFR STUDY;  Surgeon: Reek Candyce GORMAN, MD;  Location: [facility] INVASIVE CV LAB;  Service: Cardiovascular;  Laterality: N/A;   LEFT HEART CATH AND CORONARY ANGIOGRAPHY N/A 10/16/2018   Procedure: LEFT HEART CATH AND CORONARY ANGIOGRAPHY;  Surgeon: Reek Candyce GORMAN, MD;  Location: [facility] INVASIVE CV LAB;  Service: Cardiovascular;  Laterality: N/A;   LOWER EXTREMITY ANGIOGRAPHY N/A 06/03/2024   Procedure: Lower Extremity Angiography;  Surgeon: Court Dorn PARAS, MD;  Location: [facility] INVASIVE CV LAB;  Service: Cardiovascular;  Laterality: N/A;   MULTIPLE TOOTH EXTRACTIONS  2013   all of them   PERIPHERAL VASCULAR INTERVENTION  03/27/2020   Procedure: PERIPHERAL VASCULAR INTERVENTION;  Surgeon: Court Dorn PARAS, MD;  Location: [facility] INVASIVE CV LAB;  Service: Cardiovascular;;  Left iliac artery    TYMPANOSTOMY TUBE PLACEMENT Bilateral 1970s    Social History   Socioeconomic History   Marital status: Divorced    Spouse name: Not on file   Number of children: 3   Years of education: Not on file   Highest education level: Not on file  Occupational History   Not on file  Tobacco Use   Smoking status: Former    Current packs/day: 0.00    Average packs/day: 0.5 packs/day for 44.0 years (22.0 ttl pk-yrs)    Types: Cigarettes    Start date: 17    Quit date: 2025    Years since quitting: 0.9   Smokeless tobacco: Never   Tobacco comments:    04/19/2024 Patient smokes a half of pack daily     Currently smoked .5ppd 02/11/24  Vaping Use   Vaping  status: Every Day   Substances: Nicotine   Substance and Sexual Activity   Alcohol use: Not Currently    Comment: 2025.....socially   Drug use: Not Currently    Types: Cocaine, Marijuana    Comment: 09/04/2016 haven't touched drugs since 1988   Sexual activity: Yes  Other Topics Concern   Not on file  Social History Narrative   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 [place]       2 stents to heart 2016, 2017   1 stent to LLE 2021   Social Drivers of Health   Financial Resource Strain: Not on file  Food Insecurity: No Food Insecurity (02/03/2024)   Hunger Vital Sign    Worried About Running Out of Food in the Last Year: Never true    Ran Out of Food in the Last Year: Never true  Transportation Needs: No Transportation Needs (02/03/2024)   PRAPARE - Transportation  Lack of Transportation (Medical): No    Lack of Transportation (Non-Medical): No  Physical Activity: Not on file  Stress: Not on file  Social Connections: Not on file  Intimate Partner Violence: Not At Risk (02/03/2024)   Humiliation, Afraid, Rape, and Kick questionnaire    Fear of Current or Ex-Partner: No    Emotionally Abused: No    Physically Abused: No    Sexually Abused: No   Family History  Problem Relation Age of Onset   Diabetes Mother    Heart attack Father    Diabetes Sister    Diabetes Maternal Grandmother     Current Outpatient Medications  Medication Sig Dispense Refill   albuterol  (VENTOLIN  HFA) 108 (90 Base) MCG/ACT inhaler Inhale 1-2 puffs into the lungs every 6 (six) hours as needed for wheezing or shortness of breath. 10 each 6   Blood Glucose Monitoring Suppl DEVI 1 each by Does not apply route as directed. Dispense based on patient and insurance preference. Use up to four times daily as directed. (FOR ICD-10 E10.9, E11.9). 1 each 0   CINNAMON PO Take 1,000 mg by mouth 2 (two) times daily.     clopidogrel  (PLAVIX ) 75 MG tablet Take 1 tablet (75 mg total) by mouth daily. 90 tablet  3   dapagliflozin  propanediol (FARXIGA ) 5 MG TABS tablet Take 1 tablet (5 mg total) by mouth daily. 30 tablet 11   dofetilide  (TIKOSYN ) 250 MCG capsule Take 1 capsule (250 mcg total) by mouth 2 (two) times daily. 180 capsule 6   ELIQUIS  5 MG TABS tablet Take 1 tablet by mouth twice daily 180 tablet 0   furosemide  (LASIX ) 40 MG tablet Take 40 mg by mouth daily.     gabapentin  (NEURONTIN ) 100 MG capsule START WITH 1 CAPSULE BY MOUTH AND INCREASE TO 3 CAPSULES BY MOUTH NIGHTLY 90 capsule 0   Glucose Blood (BLOOD GLUCOSE TEST STRIPS) STRP 1 each by Does not apply route as directed. Dispense based on patient and insurance preference. Use up to four times daily as directed. (FOR ICD-10 E10.9, E11.9). 100 strip 0   Lancet Device MISC 1 each by Does not apply route as directed. Dispense based on patient and insurance preference. Use up to four times daily as directed. (FOR ICD-10 E10.9, E11.9). 1 each 0   Lancets MISC 1 each by Does not apply route as directed. Dispense based on patient and insurance preference. Use up to four times daily as directed. (FOR ICD-10 E10.9, E11.9). 100 each 0   metFORMIN  (GLUCOPHAGE ) 1000 MG tablet Take 1 tablet (1,000 mg total) by mouth 2 (two) times daily with a meal. 180 tablet 3   metoprolol  succinate (TOPROL -XL) 50 MG 24 hr tablet Take 1 tablet (50 mg total) by mouth daily. Take with or immediately following a meal. 90 tablet 3   metoprolol  tartrate (LOPRESSOR ) 25 MG tablet Take 1 tablet (25 mg total) by mouth 2 (two) times daily as needed (HR >100). 60 tablet 11   nitroGLYCERIN  (NITROSTAT ) 0.4 MG SL tablet Place 0.4 mg under the tongue every 5 (five) minutes as needed for chest pain.     Omega 3 1000 MG CAPS Take 1,000 mg by mouth 2 (two) times daily.     OZEMPIC , 1 MG/DOSE, 4 MG/3ML SOPN INJECT 1 MG SUBCUTANEOUSLY  ONCE A WEEK 3 mL 0   pantoprazole  (PROTONIX ) 40 MG tablet Take 1 tablet (40 mg total) by mouth daily. 45 tablet 0   rosuvastatin  (CRESTOR ) 20 MG tablet Take  1  tablet by mouth once daily 90 tablet 0   sacubitril -valsartan  (ENTRESTO ) 24-26 MG TAKE 1 TABLET BY MOUTH TWICE DAILY. START THIS ON THURSDAY 2/13 60 tablet 11   sildenafil  (VIAGRA ) 50 MG tablet Take 1 tablet (50 mg total) by mouth daily as needed for erectile dysfunction. 10 tablet 6   spironolactone  (ALDACTONE ) 25 MG tablet Take 1 tablet by mouth once daily 30 tablet 11   No current facility-administered medications for this visit.    Allergies  Allergen Reactions   Atorvastatin  Other (See Comments)    Pt reports causes myalgias. Tolerating rosuvastatin  low dose     REVIEW OF SYSTEMS:  [X]  denotes positive finding, [ ]  denotes negative finding Cardiac  Comments:  Chest pain or chest pressure:    Shortness of breath upon exertion:    Short of breath when lying flat:    Irregular heart rhythm:        Vascular    Pain in calf, thigh, or hip brought on by ambulation:    Pain in feet at night that wakes you up from your sleep:     Blood clot in your veins:    Leg swelling:         Pulmonary    Oxygen at home:    Productive cough:     Wheezing:         Neurologic    Sudden weakness in arms or legs:     Sudden numbness in arms or legs:     Sudden onset of difficulty speaking or slurred speech:    Temporary loss of vision in one eye:     Problems with dizziness:         Gastrointestinal    Blood in stool:     Vomited blood:         Genitourinary    Burning when urinating:     Blood in urine:        Psychiatric    Major depression:         Hematologic    Bleeding problems:    Problems with blood clotting too easily:        Skin    Rashes or ulcers:        Constitutional    Fever or chills:      PHYSICAL EXAMINATION:  There were no vitals filed for this visit.  General:  WDWN in NAD; vital signs documented above Gait: Not observed HENT: WNL, normocephalic Pulmonary: normal non-labored breathing , without wheezing Cardiac: regular HR Abdomen: soft, NT, no  masses Skin: without rashes Vascular Exam/Pulses:  Right Left  Radial 2+ (normal) 2+ (normal)  Ulnar    Femoral    Popliteal    DP 2+ (normal) nonpalpable  PT     Extremities: without ischemic changes, without Gangrene , without cellulitis; without open wounds;  Musculoskeletal: no muscle wasting or atrophy  Neurologic: A&O X 3;  No focal weakness or paresthesias are detected Psychiatric:  The pt has Normal affect.   Non-Invasive Vascular Imaging:   Angiography demonstrates occlusion of the left sided common iliac artery stent, left common femoral artery with severe stenosis    ASSESSMENT/PLAN: Ian [name] is a 53 y.o. male presenting with lifestyle-limiting claudication and occluded left common iliac artery stent.  Imaging also notes severe calcific disease with greater than 90% stenosis of the left common femoral artery.  I had a long discussion with Ian [name] regarding the above.  I am happy that he is  not having rest pain, but his symptoms are lifestyle-limiting -he is working 4 hours a day, and has to stand in place.  Grocery shopping is very painful.  Cathlyn and I discussed that with his atherosclerotic distribution he would need left common femoral endarterectomy, repeat stenting of the left common iliac artery.  He is aware that repeat iliac artery stenting would require kissing iliac stents as I would need to raise the bifurcation.  This is a bad problem at 53 years old.  He is aware that options include continued medical management versus surgery. With the blockages that he has I do not think his symptoms will improve.  We had a long detailed discussion regarding the risks and benefits of surgery.  Ian [name] was hesitant to move forward.  He asked for some time to weigh his options, which I think is very reasonable.  My plan is to call him in 1 month's time to assess his symptoms, and answer any further questions he may have regarding intervention.  I asked him to continue his  current medication regimen and call should any questions or concerns arise.  Fonda FORBES Rim, MD [facility] [phone]

## 2024-07-21 ENCOUNTER — Encounter: Payer: Self-pay | Admitting: Family Medicine

## 2024-07-21 NOTE — Telephone Encounter (Signed)
 Patient is calling to f/u on DOT release. He would like to know how he will get the documentation as he does not have transportation to pick it up. Please advise.

## 2024-07-22 ENCOUNTER — Ambulatory Visit: Attending: Vascular Surgery | Admitting: Vascular Surgery

## 2024-07-22 DIAGNOSIS — I739 Peripheral vascular disease, unspecified: Secondary | ICD-10-CM | POA: Insufficient documentation

## 2024-07-23 ENCOUNTER — Other Ambulatory Visit: Payer: Self-pay | Admitting: *Deleted

## 2024-07-23 DIAGNOSIS — T82599A Other mechanical complication of unspecified cardiac and vascular devices and implants, initial encounter: Secondary | ICD-10-CM

## 2024-08-02 ENCOUNTER — Other Ambulatory Visit: Payer: Self-pay | Admitting: Family Medicine

## 2024-08-19 ENCOUNTER — Encounter: Payer: Self-pay | Admitting: Cardiovascular Disease

## 2024-08-19 ENCOUNTER — Telehealth: Payer: Self-pay | Admitting: Cardiovascular Disease

## 2024-08-19 NOTE — Telephone Encounter (Signed)
 Pt asking for another copy of medical card release letter. Please advise.

## 2024-08-19 NOTE — Telephone Encounter (Signed)
 Spoke with patient. See phone encounter 08/19/24

## 2024-08-19 NOTE — Telephone Encounter (Signed)
 Spoke with Pt. Walked pt how to get to letter on Mychart. Told patient to call back with any other questions. Pt stated understanding

## 2024-08-20 ENCOUNTER — Encounter: Payer: Self-pay | Admitting: Internal Medicine

## 2024-08-22 ENCOUNTER — Encounter: Payer: Self-pay | Admitting: Family Medicine

## 2024-08-27 ENCOUNTER — Ambulatory Visit
Attending: Student in an Organized Health Care Education/Training Program | Admitting: Student in an Organized Health Care Education/Training Program

## 2024-08-29 NOTE — Progress Notes (Unsigned)
" °  Cardiology Office Note   Date:  08/29/2024  ID:  Ian [name], DOB April 14, 1971, MRN [id number] PCP: Kayla Jeoffrey RAMAN, FNP  [facility] Providers Cardiologist:  Dorn Lesches, MD Electrophysiologist:  [name], MD (Inactive) { Click to update primary MD,subspecialty MD or APP then REFRESH:1}    History of Present Illness Ian [name] is a 54 y.o. male ***  ROS: ***  Studies Reviewed      *** Risk Assessment/Calculations {Does this patient have ATRIAL FIBRILLATION?:[phone]} No BP recorded.  {Refresh Note OR Click here to enter BP  :1}***       Physical Exam VS:  There were no vitals taken for this visit.       Wt Readings from Last 3 Encounters:  06/24/24 234 lb 6.4 oz (106.3 kg)  06/22/24 235 lb (106.6 kg)  06/21/24 234 lb (106.1 kg)    GEN: Well nourished, well developed in no acute distress NECK: No JVD; No carotid bruits CARDIAC: ***RRR, no murmurs, rubs, gallops RESPIRATORY:  Clear to auscultation without rales, wheezing or rhonchi  ABDOMEN: Soft, non-tender, non-distended EXTREMITIES:  No edema; No deformity   ASSESSMENT AND PLAN ***    {Are you ordering a CV Procedure (e.g. stress test, cath, DCCV, TEE, etc)?   Press F2        :[id number]}  Dispo: ***  Signed, Donnice DELENA Primus, MD  "

## 2024-08-30 ENCOUNTER — Other Ambulatory Visit: Payer: Self-pay | Admitting: Cardiology

## 2024-08-30 ENCOUNTER — Other Ambulatory Visit: Payer: Self-pay | Admitting: Family Medicine

## 2024-09-01 ENCOUNTER — Other Ambulatory Visit: Payer: Self-pay | Admitting: Family Medicine

## 2024-09-05 ENCOUNTER — Other Ambulatory Visit: Payer: Self-pay | Admitting: Family Medicine

## 2024-09-05 DIAGNOSIS — E782 Mixed hyperlipidemia: Secondary | ICD-10-CM

## 2024-09-08 ENCOUNTER — Telehealth (HOSPITAL_COMMUNITY): Payer: Self-pay | Admitting: Cardiology

## 2024-09-16 ENCOUNTER — Telehealth: Payer: Self-pay

## 2024-09-16 NOTE — Telephone Encounter (Signed)
 SABRA

## 2024-09-22 ENCOUNTER — Ambulatory Visit: Admitting: Cardiology

## 2024-10-21 ENCOUNTER — Ambulatory Visit: Admitting: Vascular Surgery

## 2024-10-21 ENCOUNTER — Ambulatory Visit (HOSPITAL_COMMUNITY)
